# Patient Record
Sex: Female | Born: 1995 | Race: Black or African American | Hispanic: No | Marital: Single | Smoking: Never smoker
Health system: Southern US, Community
[De-identification: ages and names within clinical notes are randomized; demographics above are authoritative.]

## PROBLEM LIST (undated history)

## (undated) ENCOUNTER — Emergency Department (HOSPITAL_COMMUNITY): Source: Home / Self Care

## (undated) ENCOUNTER — Inpatient Hospital Stay (HOSPITAL_COMMUNITY): Payer: Self-pay

## (undated) DIAGNOSIS — E559 Vitamin D deficiency, unspecified: Secondary | ICD-10-CM

## (undated) DIAGNOSIS — O24419 Gestational diabetes mellitus in pregnancy, unspecified control: Secondary | ICD-10-CM

## (undated) DIAGNOSIS — F32A Depression, unspecified: Secondary | ICD-10-CM

## (undated) DIAGNOSIS — R61 Generalized hyperhidrosis: Secondary | ICD-10-CM

## (undated) DIAGNOSIS — T148XXA Other injury of unspecified body region, initial encounter: Secondary | ICD-10-CM

## (undated) DIAGNOSIS — F4310 Post-traumatic stress disorder, unspecified: Secondary | ICD-10-CM

## (undated) DIAGNOSIS — G919 Hydrocephalus, unspecified: Secondary | ICD-10-CM

## (undated) DIAGNOSIS — T7840XA Allergy, unspecified, initial encounter: Secondary | ICD-10-CM

## (undated) DIAGNOSIS — B999 Unspecified infectious disease: Secondary | ICD-10-CM

## (undated) DIAGNOSIS — Z5189 Encounter for other specified aftercare: Secondary | ICD-10-CM

## (undated) DIAGNOSIS — J45909 Unspecified asthma, uncomplicated: Secondary | ICD-10-CM

## (undated) DIAGNOSIS — K8050 Calculus of bile duct without cholangitis or cholecystitis without obstruction: Secondary | ICD-10-CM

## (undated) DIAGNOSIS — K8020 Calculus of gallbladder without cholecystitis without obstruction: Secondary | ICD-10-CM

## (undated) DIAGNOSIS — F411 Generalized anxiety disorder: Secondary | ICD-10-CM

## (undated) HISTORY — DX: Allergy, unspecified, initial encounter: T78.40XA

## (undated) HISTORY — DX: Unspecified asthma, uncomplicated: J45.909

## (undated) HISTORY — DX: Encounter for other specified aftercare: Z51.89

## (undated) HISTORY — DX: Depression, unspecified: F32.A

## (undated) HISTORY — DX: Hydrocephalus, unspecified: G91.9

---
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
## 2017-09-07 ENCOUNTER — Emergency Department (HOSPITAL_COMMUNITY)
Admission: EM | Admit: 2017-09-07 | Discharge: 2017-09-07 | Disposition: A | Discharge status: Home / Self Care | Payer: 59 | Attending: Emergency Medicine | Admitting: Emergency Medicine

## 2017-09-07 ENCOUNTER — Encounter (HOSPITAL_COMMUNITY): Payer: Self-pay | Admitting: Emergency Medicine

## 2017-09-07 DIAGNOSIS — R1013 Epigastric pain: Secondary | ICD-10-CM | POA: Insufficient documentation

## 2017-09-07 DIAGNOSIS — R102 Pelvic and perineal pain: Secondary | ICD-10-CM | POA: Diagnosis not present

## 2017-09-07 LAB — LIPASE, BLOOD: LIPASE: 23 U/L (ref 11–51)

## 2017-09-07 LAB — URINALYSIS, ROUTINE W REFLEX MICROSCOPIC
BILIRUBIN URINE: NEGATIVE
GLUCOSE, UA: NEGATIVE mg/dL
KETONES UR: 20 mg/dL — AB
Nitrite: NEGATIVE
PROTEIN: 100 mg/dL — AB
Specific Gravity, Urine: 1.017 (ref 1.005–1.030)
pH: 6 (ref 5.0–8.0)

## 2017-09-07 LAB — COMPREHENSIVE METABOLIC PANEL
ALT: 17 U/L (ref 14–54)
AST: 21 U/L (ref 15–41)
Albumin: 3.7 g/dL (ref 3.5–5.0)
Alkaline Phosphatase: 57 U/L (ref 38–126)
Anion gap: 9 (ref 5–15)
BUN: 7 mg/dL (ref 6–20)
CHLORIDE: 106 mmol/L (ref 101–111)
CO2: 25 mmol/L (ref 22–32)
CREATININE: 0.74 mg/dL (ref 0.44–1.00)
Calcium: 9.1 mg/dL (ref 8.9–10.3)
Glucose, Bld: 88 mg/dL (ref 65–99)
POTASSIUM: 4.1 mmol/L (ref 3.5–5.1)
SODIUM: 140 mmol/L (ref 135–145)
Total Bilirubin: 0.8 mg/dL (ref 0.3–1.2)
Total Protein: 6.8 g/dL (ref 6.5–8.1)

## 2017-09-07 LAB — HCG, QUANTITATIVE, PREGNANCY: hCG, Beta Chain, Quant, S: <1 m[IU]/mL

## 2017-09-07 LAB — CBC
HCT: 37.6 % (ref 36.0–46.0)
Hemoglobin: 12.3 g/dL (ref 12.0–15.0)
MCH: 27.7 pg (ref 26.0–34.0)
MCHC: 32.7 g/dL (ref 30.0–36.0)
MCV: 84.7 fL (ref 78.0–100.0)
Platelets: 268 K/uL (ref 150–400)
RBC: 4.44 MIL/uL (ref 3.87–5.11)
RDW: 12.9 % (ref 11.5–15.5)
WBC: 8.7 K/uL (ref 4.0–10.5)

## 2017-09-07 LAB — PREGNANCY, URINE: Preg Test, Ur: NEGATIVE

## 2017-09-07 LAB — I-STAT BETA HCG BLOOD, ED (MC, WL, AP ONLY): I-stat hCG, quantitative: 6.1 m[IU]/mL — ABNORMAL HIGH

## 2017-09-07 MED ORDER — RANITIDINE HCL 150 MG PO TABS: 150.0000 mg | ORAL_TABLET | Freq: Two times a day (BID) | ORAL | 0 refills | Status: DC

## 2017-09-07 MED ORDER — ONDANSETRON 4 MG PO TBDP: 4.0000 mg | ORAL_TABLET | Freq: Three times a day (TID) | ORAL | 0 refills | Status: DC | PRN

## 2017-09-07 NOTE — ED Triage Notes (Signed)
Pt reports lower abd pain X 1 day, states pain is going into back. Sharp/cramping, 10/10. Denies N/V/D.

## 2017-09-07 NOTE — Discharge Instructions (Signed)
1. Medications: Take Zofran as needed for nausea.  Wait around 20 minutes before eating or drinking after taking this medication.  Start taking Zantac twice daily with meals. 2. Treatment: rest, drink plenty of fluids, advance diet slowly.  Eat a diet of bland foods that will not upset your stomach.  See the attached information. 3. Follow Up: Please followup with your primary doctor in 3 days for discussion of your diagnoses and further evaluation after today's visit; if you do not have a primary care doctor, call the number on the back of your insurance card to help find one. please return to the ER for persistent vomiting, high fevers or worsening symptoms

## 2017-09-07 NOTE — ED Provider Notes (Signed)
[facility] EMERGENCY DEPARTMENT Provider Note   CSN: [id number] Arrival date & time: 09/07/17  0000     History   Chief Complaint Chief Complaint  Patient presents with  . Abdominal Pain    [name] is a 22 y.o. female with no significant past medical history presents today for evaluation of acute onset, intermittent epigastric abdominal pain for 2 days.  She states that pain is in the epigastric region, throbbing and sharp in nature and comes on after meals.  Pain will at times radiate to her back.  Pain worsened after taking Pepto-Bismol and she states that after taking this medication she had 5 episodes of nonbloody nonbilious emesis.  She endorses intermittent nausea but denies any other episodes of emesis.  Denies diarrhea, constipation, melena, hematochezia, dysuria, hematuria, urgency.  She does endorse a few days of urinary frequency.  She is also tried drinking a lot of water and ginger ale without relief of her symptoms.  She is currently sexually active with one female partner and does not always use protection.  She denies vaginal itching, bleeding, or discharge.  She states she has no concern for STDs.  She currently denies any abdominal pain or nausea.   The history is provided by the patient.    History reviewed. No pertinent past medical history.  There are no active problems to display for this patient.   History reviewed. No pertinent surgical history.   OB History   None      Home Medications    Prior to Admission medications   Medication Sig Start Date End Date Taking? Authorizing Provider  hydrocortisone cream 1 % Apply to affected area 2 times daily Patient not taking: Reported on 09/07/2017 11/28/16   Belinda Fisher, PA-C  naproxen (NAPROSYN) 500 MG tablet Take 1 tablet (500 mg total) by mouth 2 (two) times daily with a meal. Patient not taking: Reported on 09/07/2017 09/05/16   Garlon Hatchet, PA-C  ondansetron (ZOFRAN ODT) 4 MG  disintegrating tablet Take 1 tablet (4 mg total) by mouth every 8 (eight) hours as needed for nausea or vomiting. 09/07/17   Naw Lasala A, PA-C  ranitidine (ZANTAC) 150 MG tablet Take 1 tablet (150 mg total) by mouth 2 (two) times daily. 09/07/17   Jeanie Sewer, PA-C    Family History No family history on file.  Social History Social History   Tobacco Use  . Smoking status: Never Smoker  . Smokeless tobacco: Never Used  Substance Use Topics  . Alcohol use: Not Currently  . Drug use: Not Currently     Allergies   Patient has no known allergies.   Review of Systems Review of Systems  Constitutional: Negative for chills and fever.  Respiratory: Negative for shortness of breath.   Cardiovascular: Negative for chest pain.  Gastrointestinal: Positive for abdominal pain, nausea and vomiting (resolved). Negative for blood in stool, constipation and diarrhea.  Genitourinary: Negative for dysuria, hematuria, urgency, vaginal bleeding, vaginal discharge and vaginal pain.  All other systems reviewed and are negative.    Physical Exam Updated Vital Signs BP 128/80 (BP Location: Right Arm)   Pulse 70   Temp 98.7 F (37.1 C) (Oral)   Resp 17   Ht  (1.549 m)   Wt 97.5 kg (215 lb)   SpO2 100%   BMI 40.62 kg/m   Physical Exam  Constitutional: She appears well-developed and well-nourished. No distress.  Resting comfortably in bed  HENT:  Head: Normocephalic and atraumatic.  Eyes: Conjunctivae are normal. Right eye exhibits no discharge. Left eye exhibits no discharge.  Neck: No JVD present. No tracheal deviation present.  Cardiovascular: Normal rate.  Pulmonary/Chest: Effort normal.  Abdominal: Soft. Bowel sounds are normal. She exhibits no distension. There is no tenderness. There is no rigidity, no rebound, no guarding, no CVA tenderness, no tenderness at McBurney's point and negative Murphy's sign.  Musculoskeletal: She exhibits no edema.  No midline spine TTP, no  paraspinal muscle tenderness, no deformity, crepitus, or step-off noted   Neurological: She is alert.  Skin: Skin is warm and dry. No erythema.  Psychiatric: She has a normal mood and affect. Her behavior is normal.  Nursing note and vitals reviewed.    ED Treatments / Results  Labs (all labs ordered are listed, but only abnormal results are displayed) Labs Reviewed  URINALYSIS, ROUTINE W REFLEX MICROSCOPIC - Abnormal; Notable for the following components:      Result Value   APPearance CLOUDY (*)    Hgb urine dipstick MODERATE (*)    Ketones, ur 20 (*)    Protein, ur 100 (*)    Leukocytes, UA SMALL (*)    RBC / HPF >50 (*)    WBC, UA >50 (*)    Bacteria, UA RARE (*)    Non Squamous Epithelial 0-5 (*)    All other components within normal limits  I-STAT BETA HCG BLOOD, ED ([facility], [facility], [facility] ONLY) - Abnormal; Notable for the following components:   I-stat hCG, quantitative 6.1 (*)    All other components within normal limits  URINE CULTURE  LIPASE, BLOOD  COMPREHENSIVE METABOLIC PANEL  CBC  HCG, QUANTITATIVE, PREGNANCY  PREGNANCY, URINE    EKG None  Radiology No results found.  Procedures Procedures (including critical care time)  Medications Ordered in ED Medications - No data to display   Initial Impression / Assessment and Plan / ED Course  I have reviewed the triage vital signs and the nursing notes.  Pertinent labs & imaging results that were available during my care of the patient were reviewed by me and considered in my medical decision making (see chart for details).     Patient presents with intermittent epigastric abdominal pain for 3 days, typically after meals.  She is afebrile, vital signs are stable.  She is nontoxic in appearance.  She had nausea and vomiting after taking Pepto-Bismol.  On exam, abdomen is soft and entirely benign.  She denies any pain or nausea.  She does note urinary frequency but states she has been drinking a lot of water to help her  symptoms.  Urine is equivocal, not a clean-catch.  We will culture with a new urine sample but defer treatment at this time.  I-STAT beta hCG is very minimally elevated, I suspect this is a false positive.  Urine pregnancy is negative, supporting this.  Remainder of lab work reviewed by me is entirely unremarkable with no leukocytosis, anemia, electrolyte abnormalities, or abnormal LFTs, lipase, or creatinine.  I doubt obstruction, perforation, ascites, colitis, PID, TOA, ovarian torsion given current symptoms.  Symptoms are suggestive of possible GERD/PUD.  Will discharge with Zantac and Zofran.  Discussed diet modifications.  Recommend follow-up with primary care physician for reevaluation of symptoms.  Discussed strict ED return precautions. Pt verbalized understanding of and agreement with plan and is safe for discharge home at this time.   Final Clinical Impressions(s) / ED Diagnoses   Final diagnoses:  Epigastric pain  ED Discharge Orders        Ordered    ranitidine (ZANTAC) 150 MG tablet  2 times daily     09/07/17 0824    ondansetron (ZOFRAN ODT) 4 MG disintegrating tablet  Every 8 hours PRN     09/07/17 0824       Jeanie Sewer, PA-C 09/07/17 4098    Zadie Rhine, MD 09/08/17 [phone]

## 2017-09-09 LAB — URINE CULTURE: Culture: >=100000 — AB

## 2017-09-10 ENCOUNTER — Telehealth: Payer: Self-pay | Admitting: *Deleted

## 2017-09-10 NOTE — Telephone Encounter (Signed)
Post ED Visit - Positive Culture Follow-up: Successful Patient Follow-Up  Culture assessed and recommendations reviewed by:  Enzo Bi, Pharm.D.  Celedonio Miyamoto, Pharm.D., BCPS AQ-ID  Garvin Fila, Pharm.D., BCPS  Georgina Pillion, Pharm.D., BCPS  [place], [place].D., BCPS, AAHIVP  Estella Husk, Pharm.D., BCPS, AAHIVP  Lysle Pearl, PharmD, BCPS  Sherlynn Carbon, PharmD  Pollyann Samples, PharmD, BCPS Sharin Mons, PharmD  Positive urine culture   Patient discharged without antimicrobial prescription and treatment is now indicated  Organism is resistant to prescribed ED discharge antimicrobial  Patient with positive blood cultures  Changes discussed with ED provider:Michael Maczis, PA-C New antibiotic prescription Bactrim DS 1 tab PO BID x 8 days Called to [facility], [address], [phone]  Contacted patient, date 09/10/2017, time 1011   Virl Axe [place] 09/10/2017, 10:09 AM

## 2017-09-10 NOTE — Progress Notes (Signed)
ED Antimicrobial Stewardship Positive Culture Follow Up   Martha Hall is an 22 y.o. female who presented to [facility] on 09/07/2017 with a chief complaint of  Chief Complaint  Patient presents with  . Abdominal Pain    Recent Results (from the past 720 hour(s))  Urine culture     Status: Abnormal   Collection Time: 09/07/17  7:58 AM  Result Value Ref Range Status   Specimen Description URINE, CLEAN CATCH  Final   Special Requests   Final    NONE Performed at [facility], [address], [place], [place] [address]    Culture >=100,000 COLONIES/mL STAPHYLOCOCCUS SAPROPHYTICUS (A)  Final   Report Status 09/09/2017 FINAL  Final   Organism ID, Bacteria STAPHYLOCOCCUS SAPROPHYTICUS (A)  Final      Susceptibility   Staphylococcus saprophyticus - MIC*    CIPROFLOXACIN <=0.5 SENSITIVE Sensitive     GENTAMICIN <=0.5 SENSITIVE Sensitive     NITROFURANTOIN <=16 SENSITIVE Sensitive     OXACILLIN 0.5 RESISTANT Resistant     TETRACYCLINE 2 SENSITIVE Sensitive     VANCOMYCIN 1 SENSITIVE Sensitive     TRIMETH/SULFA <=10 SENSITIVE Sensitive     CLINDAMYCIN <=0.25 SENSITIVE Sensitive     RIFAMPIN <=0.5 SENSITIVE Sensitive     Inducible Clindamycin NEGATIVE Sensitive     * >=100,000 COLONIES/mL STAPHYLOCOCCUS SAPROPHYTICUS    Patient discharged without antibiotic.  New antibiotic prescription:  Call patient Bactrim DS - 1 tablet BID X 3 days  ED Provider: Leary Roca, PA-C   Della Goo, PharmD, BCPS 09/10/2017, 9:21 AM Infectious Diseases Pharmacist Phone# [phone]

## 2018-05-24 ENCOUNTER — Encounter (HOSPITAL_COMMUNITY): Payer: Self-pay

## 2018-05-24 ENCOUNTER — Other Ambulatory Visit: Payer: Self-pay

## 2018-05-24 ENCOUNTER — Ambulatory Visit (HOSPITAL_COMMUNITY)
Admission: EM | Admit: 2018-05-24 | Discharge: 2018-05-24 | Disposition: A | Discharge status: Home / Self Care | Payer: 59 | Attending: Internal Medicine | Admitting: Internal Medicine

## 2018-05-24 DIAGNOSIS — J019 Acute sinusitis, unspecified: Secondary | ICD-10-CM

## 2018-05-24 MED ORDER — FLUTICASONE PROPIONATE 50 MCG/ACT NA SUSP: 2.0000 | Freq: Every day | NASAL | 0 refills | Status: DC

## 2018-05-24 MED ORDER — AMOXICILLIN-POT CLAVULANATE 875-125 MG PO TABS: 1.0000 | ORAL_TABLET | Freq: Two times a day (BID) | ORAL | 0 refills | Status: AC

## 2018-05-24 MED ORDER — CETIRIZINE-PSEUDOEPHEDRINE ER 5-120 MG PO TB12: 1.0000 | ORAL_TABLET | Freq: Every day | ORAL | 0 refills | Status: DC

## 2018-05-24 NOTE — ED Provider Notes (Signed)
[facility]   [id number] 05/24/18 Arrival Time: 1614   CC: URI symptoms   SUBJECTIVE: History from: patient.  Martha Hall is a 23 y.o. female who presents with abrupt onset of nasal congestion, sinus pain/ pressure, sore throat, and cough x "2 months."  Worsening symptoms over the past few days.  Denies positive sick exposure or precipitating event.  Has tried OTC medications without relief.  Symptoms are made worse at night.  Denies previous symptoms in the past.   Complains of difficulty breathing through nose.  Denies fever, chills, fatigue, rhinorrhea, SOB, wheezing, chest pain, nausea, changes in bowel or bladder habits.    Received flu shot this year: no.  ROS: As per HPI.  History reviewed. No pertinent past medical history. History reviewed. No pertinent surgical history. No Known Allergies No current facility-administered medications on file prior to encounter.    Current Outpatient Medications on File Prior to Encounter  Medication Sig Dispense Refill  . ondansetron (ZOFRAN ODT) 4 MG disintegrating tablet Take 1 tablet (4 mg total) by mouth every 8 (eight) hours as needed for nausea or vomiting. 10 tablet 0  . ranitidine (ZANTAC) 150 MG tablet Take 1 tablet (150 mg total) by mouth 2 (two) times daily. 60 tablet 0   Social History   Socioeconomic History  . Marital status: Single    Spouse name: Not on file  . Number of children: Not on file  . Years of education: Not on file  . Highest education level: Not on file  Occupational History  . Not on file  Social Needs  . Financial resource strain: Not on file  . Food insecurity:    Worry: Not on file    Inability: Not on file  . Transportation needs:    Medical: Not on file    Non-medical: Not on file  Tobacco Use  . Smoking status: Never Smoker  . Smokeless tobacco: Never Used  Substance and Sexual Activity  . Alcohol use: Not Currently  . Drug use: Not Currently  . Sexual activity: Not on file   Lifestyle  . Physical activity:    Days per week: Not on file    Minutes per session: Not on file  . Stress: Not on file  Relationships  . Social connections:    Talks on phone: Not on file    Gets together: Not on file    Attends religious service: Not on file    Active member of club or organization: Not on file    Attends meetings of clubs or organizations: Not on file    Relationship status: Not on file  . Intimate partner violence:    Fear of current or ex partner: Not on file    Emotionally abused: Not on file    Physically abused: Not on file    Forced sexual activity: Not on file  Other Topics Concern  . Not on file  Social History Narrative  . Not on file   History reviewed. No pertinent family history.  OBJECTIVE:  Vitals:   05/24/18 1803  BP: 116/88  Pulse: 70  Resp: 17  Temp: (!) 97 F (36.1 C)  TempSrc: Oral  SpO2: 100%     General appearance: alert; appears mildly fatigued, but nontoxic; speaking in full sentences and tolerating own secretions HEENT: NCAT; Ears: EACs clear with mild cerumen, TMs pearly gray; Eyes: PERRL.  EOM grossly intact. Sinuses: mild TTP; Nose: nares patent without rhinorrhea, turbinates swollen and erythematous, Throat: oropharynx  clear, tonsils non erythematous or enlarged, uvula midline  Neck: supple without LAD Lungs: unlabored respirations, symmetrical air entry; cough: absent; no respiratory distress; CTAB Heart: regular rate and rhythm.  Radial pulses 2+ symmetrical bilaterally Skin: warm and dry Psychological: alert and cooperative; normal mood and affect  ASSESSMENT & PLAN:  1. Acute non-recurrent sinusitis, unspecified location     Meds ordered this encounter  Medications  . amoxicillin-clavulanate (AUGMENTIN) 875-125 MG tablet    Sig: Take 1 tablet by mouth every 12 (twelve) hours for 10 days.    Dispense:  20 tablet    Refill:  0    Order Specific Question:   Supervising Provider    Answer:   Eustace MooreNELSON, YVONNE  SUE [[id number]]  . cetirizine-pseudoephedrine (ZYRTEC-D) 5-120 MG tablet    Sig: Take 1 tablet by mouth daily.    Dispense:  30 tablet    Refill:  0    Order Specific Question:   Supervising Provider    Answer:   Eustace MooreNELSON, YVONNE SUE [[id number]]  . fluticasone (FLONASE) 50 MCG/ACT nasal spray    Sig: Place 2 sprays into both nostrils daily.    Dispense:  16 g    Refill:  0    Order Specific Question:   Supervising Provider    Answer:   Eustace MooreELSON, YVONNE SUE [[id number]]    Rest and push fluids Zyrtec D and flonase prescribed.  Use daily for symptomatic relief Augmentin prescribed.  Take as directed and to completion Continue with OTC ibuprofen/tylenol as needed for pain Follow up with PCP or [facility] if symptoms persists Return or go to the ED if you have any new or worsening symptoms such as fever, chills, worsening sinus pain/pressure, cough, sore throat, chest pain, shortness of breath, abdominal pain, changes in bowel or bladder habits, etc...  Reviewed expectations re: course of current medical issues. Questions answered. Outlined signs and symptoms indicating need for more acute intervention. Patient verbalized understanding. After Visit Summary given.         Rennis HardingWurst, Yumiko Alkins, PA-C 05/24/18 1851

## 2018-05-24 NOTE — ED Triage Notes (Signed)
Pt presents to [facility] for cough and congestion x4 days, pt also complains of SOB when coughing. Pt has taken OTC medications but has no relief

## 2018-05-24 NOTE — Discharge Instructions (Signed)
Rest and push fluids °Zyrtec D and flonase prescribed.  Use daily for symptomatic relief °Augmentin prescribed.  Take as directed and to completion °Continue with OTC ibuprofen/tylenol as needed for pain °Follow up with PCP or [facility] if symptoms persists °Return or go to the ED if you have any new or worsening symptoms such as fever, chills, worsening sinus pain/pressure, cough, sore throat, chest pain, shortness of breath, abdominal pain, changes in bowel or bladder habits, etc...  °

## 2019-03-27 ENCOUNTER — Encounter: Payer: Self-pay | Admitting: Family Medicine

## 2019-03-27 ENCOUNTER — Other Ambulatory Visit: Payer: Self-pay

## 2019-03-27 ENCOUNTER — Ambulatory Visit (INDEPENDENT_AMBULATORY_CARE_PROVIDER_SITE_OTHER): Payer: 59 | Admitting: General Practice

## 2019-03-27 DIAGNOSIS — Z3201 Encounter for pregnancy test, result positive: Secondary | ICD-10-CM

## 2019-03-27 LAB — POCT PREGNANCY, URINE: Preg Test, Ur: POSITIVE — AB

## 2019-03-27 MED ORDER — PRENATAL VITAMIN 27-0.8 MG PO TABS: 1.0000 | ORAL_TABLET | Freq: Every day | ORAL | 11 refills | Status: DC

## 2019-03-27 NOTE — Progress Notes (Signed)
Patient presents to office today for UPT. UPT +. Patient reports first positive home test last Wednesday. LMP 02/16/19 EDD 11/23/19 [redacted]w[redacted]d. Reports taking PNV daily but would like Rx sent in. Rx sent in per protocol. Advised she begin OB care.   Koren Bound RN BSN 03/27/19

## 2019-03-30 NOTE — Progress Notes (Signed)
I agree with the nurses note and plan of care.    Lezlie Lye, NP 03/30/2019 4:17 PM

## 2019-04-23 ENCOUNTER — Ambulatory Visit (INDEPENDENT_AMBULATORY_CARE_PROVIDER_SITE_OTHER): Payer: 59

## 2019-04-23 DIAGNOSIS — O0993 Supervision of high risk pregnancy, unspecified, third trimester: Secondary | ICD-10-CM | POA: Insufficient documentation

## 2019-04-23 DIAGNOSIS — Z3490 Encounter for supervision of normal pregnancy, unspecified, unspecified trimester: Secondary | ICD-10-CM | POA: Insufficient documentation

## 2019-04-23 MED ORDER — BLOOD PRESSURE KIT DEVI: 1.0000 | 0 refills | Status: DC

## 2019-04-23 NOTE — Progress Notes (Signed)
I connected with  Martha Hall on 04/23/19 by a video enabled telemedicine application and verified that I am speaking with the correct person using two identifiers.   I discussed the limitations of evaluation and management by telemedicine. The patient expressed understanding and agreed to proceed.  PRENATAL INTAKE SUMMARY  Martha Hall presents today New OB Nurse Interview.  OB History    Gravida  1   Para      Term      Preterm      AB      Living  0     SAB      TAB      Ectopic      Multiple      Live Births             I have reviewed the patient's medical, obstetrical, social, and family histories, medications, and available lab results.  SUBJECTIVE She has no unusual complaints  OBJECTIVE Initial Physical Exam (New OB)  GENERAL APPEARANCE: alert, well appearing, oriented to person, place and time   ASSESSMENT Normal pregnancy  PLAN Prenatal care at [facility] OB Pnl/HIV will be done at [facility] Appt. BP Cuff ordered

## 2019-04-25 ENCOUNTER — Other Ambulatory Visit: Payer: Self-pay

## 2019-04-25 ENCOUNTER — Inpatient Hospital Stay (HOSPITAL_COMMUNITY): Payer: 59

## 2019-04-25 ENCOUNTER — Inpatient Hospital Stay (HOSPITAL_COMMUNITY)
Admission: EM | Admit: 2019-04-25 | Discharge: 2019-04-25 | Disposition: A | Discharge status: Home / Self Care | Payer: 59 | Attending: Obstetrics & Gynecology | Admitting: Obstetrics & Gynecology

## 2019-04-25 ENCOUNTER — Encounter (HOSPITAL_COMMUNITY): Payer: Self-pay | Admitting: Obstetrics & Gynecology

## 2019-04-25 DIAGNOSIS — Z7951 Long term (current) use of inhaled steroids: Secondary | ICD-10-CM | POA: Insufficient documentation

## 2019-04-25 DIAGNOSIS — O418X10 Other specified disorders of amniotic fluid and membranes, first trimester, not applicable or unspecified: Secondary | ICD-10-CM | POA: Diagnosis not present

## 2019-04-25 DIAGNOSIS — Z79899 Other long term (current) drug therapy: Secondary | ICD-10-CM | POA: Insufficient documentation

## 2019-04-25 DIAGNOSIS — Z3490 Encounter for supervision of normal pregnancy, unspecified, unspecified trimester: Secondary | ICD-10-CM

## 2019-04-25 DIAGNOSIS — O209 Hemorrhage in early pregnancy, unspecified: Secondary | ICD-10-CM | POA: Diagnosis present

## 2019-04-25 DIAGNOSIS — Z3A09 9 weeks gestation of pregnancy: Secondary | ICD-10-CM | POA: Diagnosis not present

## 2019-04-25 DIAGNOSIS — Z3A10 10 weeks gestation of pregnancy: Secondary | ICD-10-CM | POA: Diagnosis not present

## 2019-04-25 DIAGNOSIS — O468X1 Other antepartum hemorrhage, first trimester: Secondary | ICD-10-CM

## 2019-04-25 HISTORY — DX: Unspecified infectious disease: B99.9

## 2019-04-25 LAB — WET PREP, GENITAL
Sperm: NONE SEEN
Trich, Wet Prep: NONE SEEN
Yeast Wet Prep HPF POC: NONE SEEN

## 2019-04-25 LAB — URINALYSIS, ROUTINE W REFLEX MICROSCOPIC
Bilirubin Urine: NEGATIVE
Glucose, UA: NEGATIVE mg/dL
Ketones, ur: 20 mg/dL — AB
Nitrite: NEGATIVE
Protein, ur: NEGATIVE mg/dL
Specific Gravity, Urine: 1.02 (ref 1.005–1.030)
pH: 6 (ref 5.0–8.0)

## 2019-04-25 LAB — CBC
HCT: 39.5 % (ref 36.0–46.0)
Hemoglobin: 12.9 g/dL (ref 12.0–15.0)
MCH: 28 pg (ref 26.0–34.0)
MCHC: 32.7 g/dL (ref 30.0–36.0)
MCV: 85.7 fL (ref 80.0–100.0)
Platelets: 335 10*3/uL (ref 150–400)
RBC: 4.61 MIL/uL (ref 3.87–5.11)
RDW: 13 % (ref 11.5–15.5)
WBC: 7.3 10*3/uL (ref 4.0–10.5)
nRBC: 0 % (ref 0.0–0.2)

## 2019-04-25 LAB — HCG, QUANTITATIVE, PREGNANCY: hCG, Beta Chain, Quant, S: 173927 m[IU]/mL — ABNORMAL HIGH (ref <5)

## 2019-04-25 IMAGING — US US OB COMP LESS 14 WK
1 series · 15 of 28 positions shown · non-contrast
Comparison: None.

CLINICAL DATA: Vaginal bleeding in 1st trimester pregnancy.

EXAM:
OBSTETRIC <14 WK ULTRASOUND
TECHNIQUE: Transabdominal ultrasound was performed for evaluation of the
gestation as well as the maternal uterus and adnexal regions.

[Series 1: us ob comp less 14 wk · 15 of 29 slices shown]
[im 1/29]
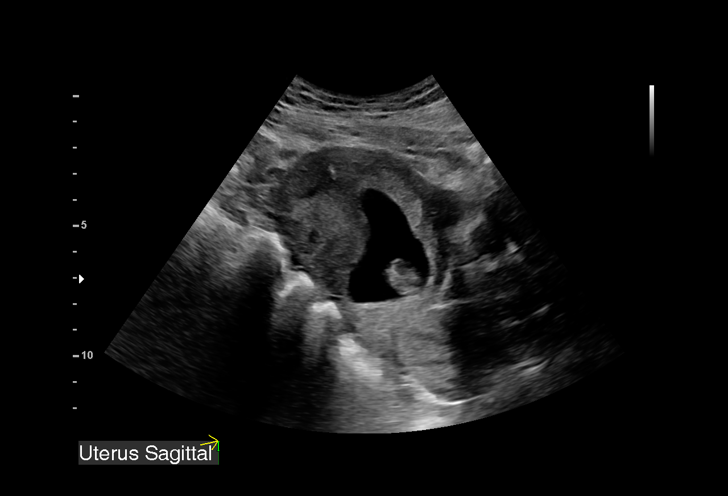
[im 3/29]
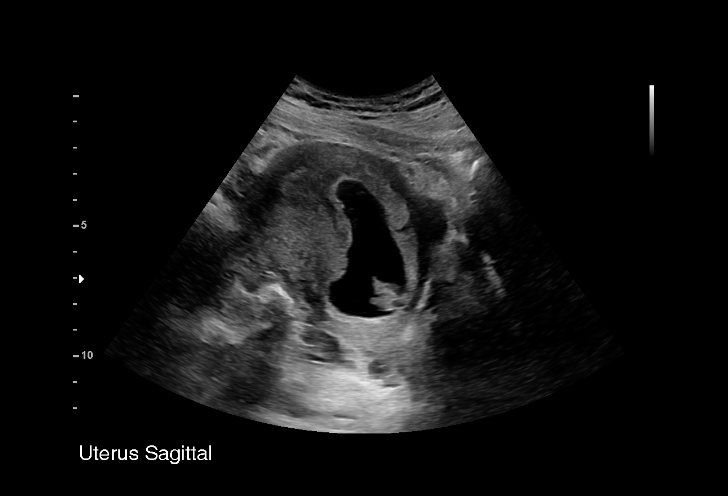
[im 5/29]
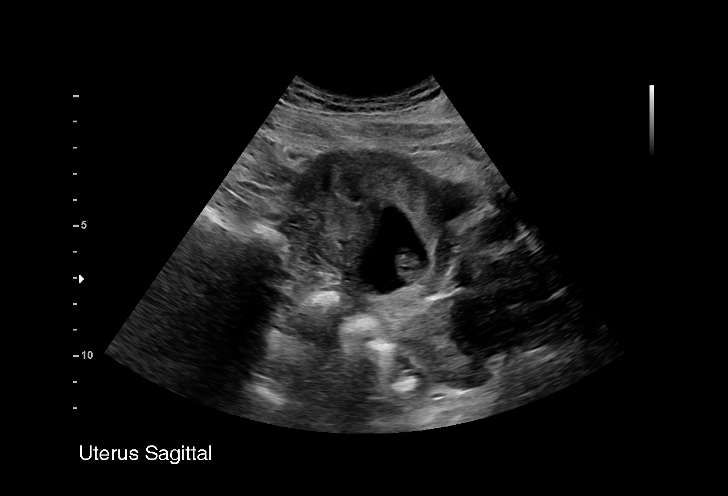
[im 7/29]
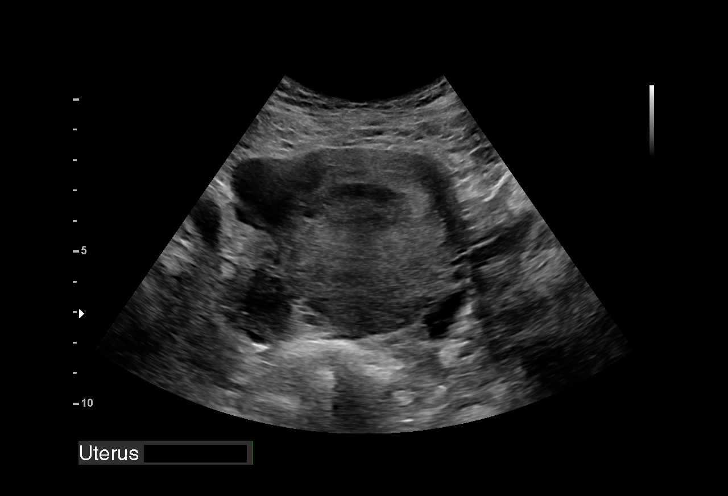
[im 9/29]
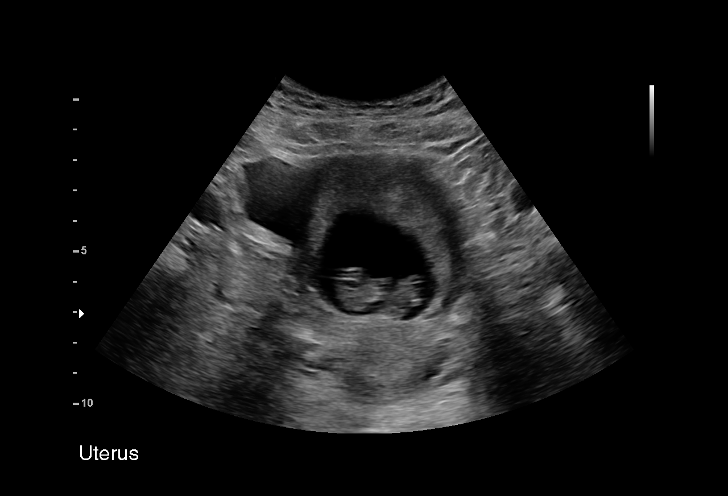
[im 11/29]
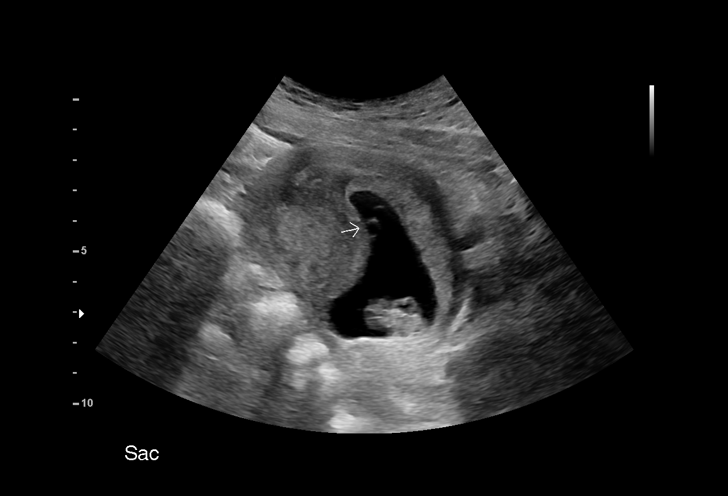
[im 13/29]
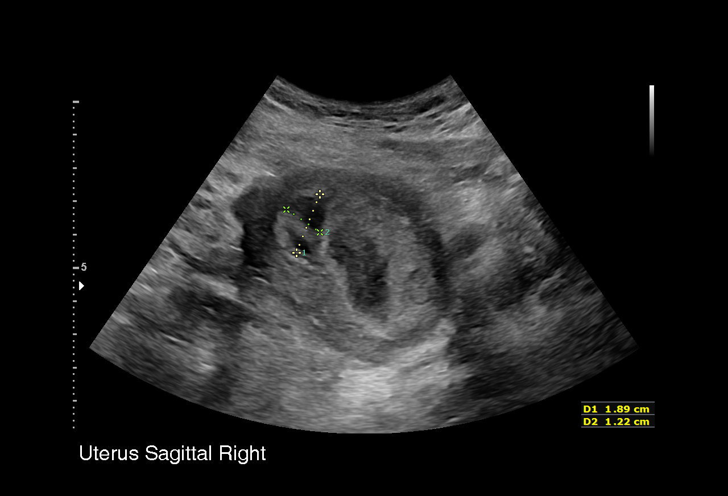
[im 15/29]
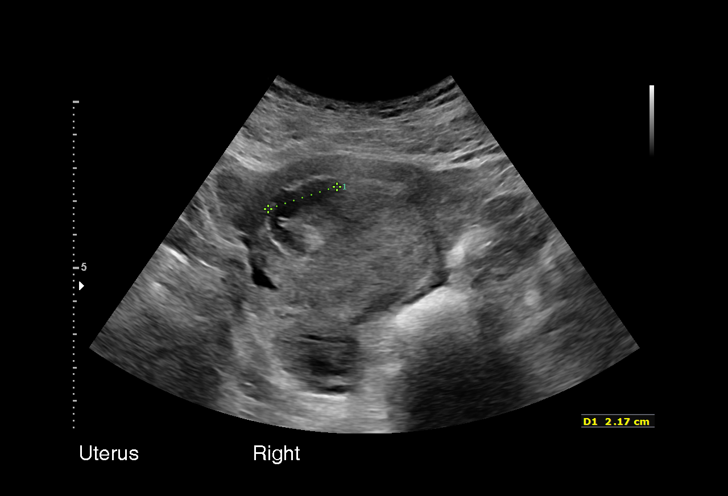
[im 16/29]
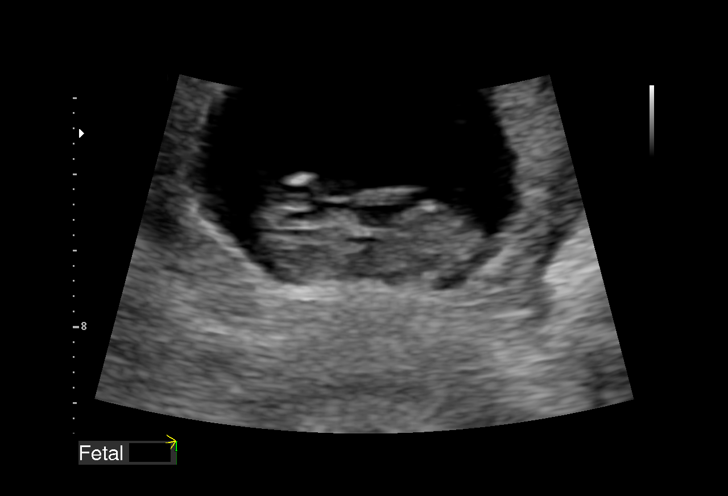
[im 18/29]
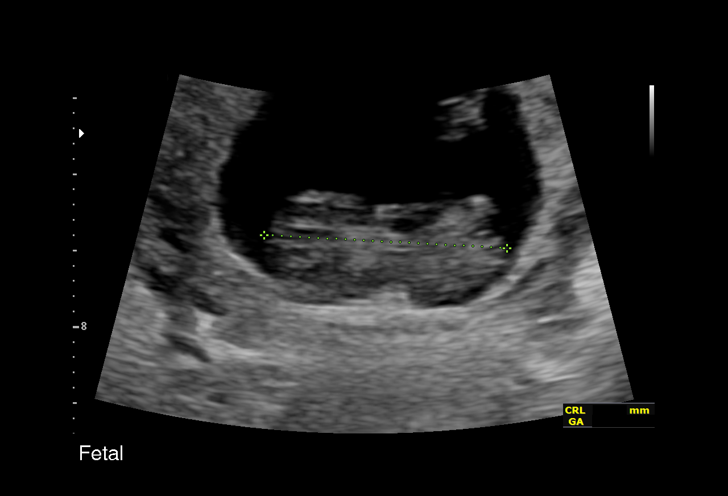
[im 20/29]
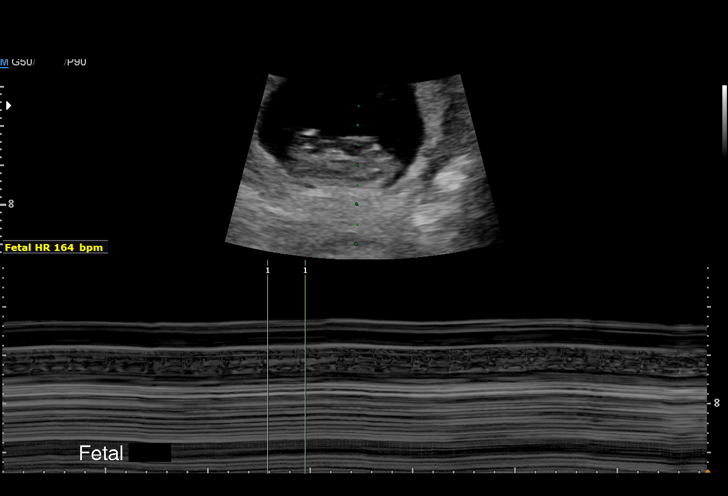
[im 22/29]
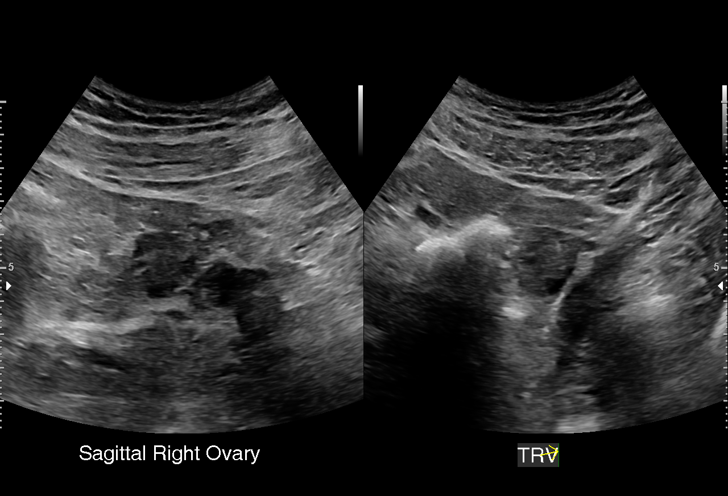
[im 24/29]
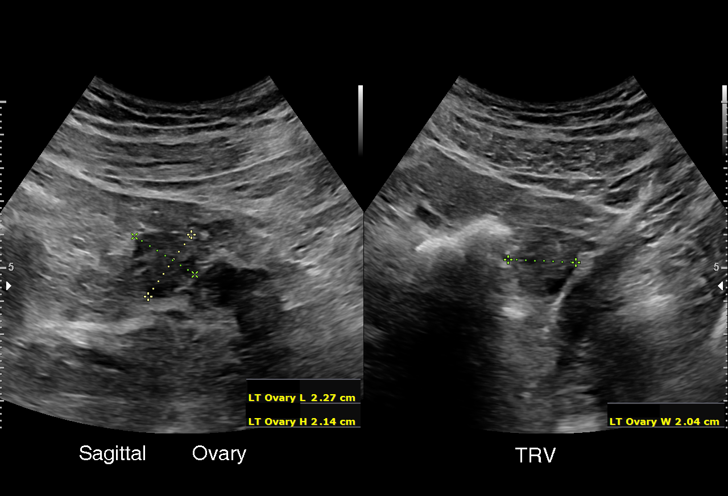
[im 26/29]
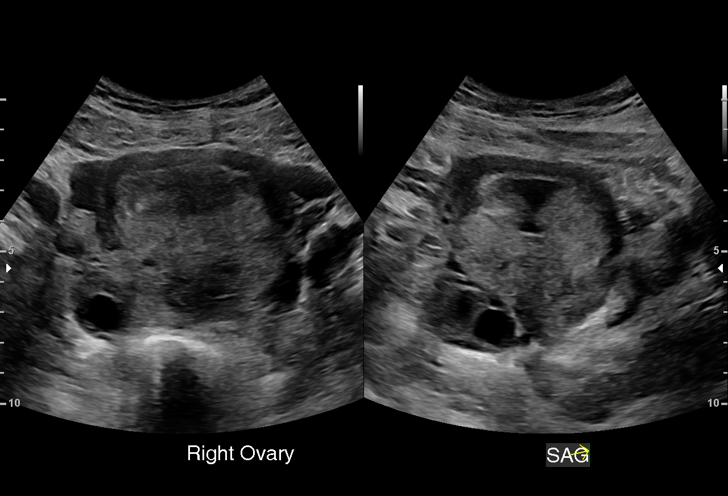
[im 29/29]
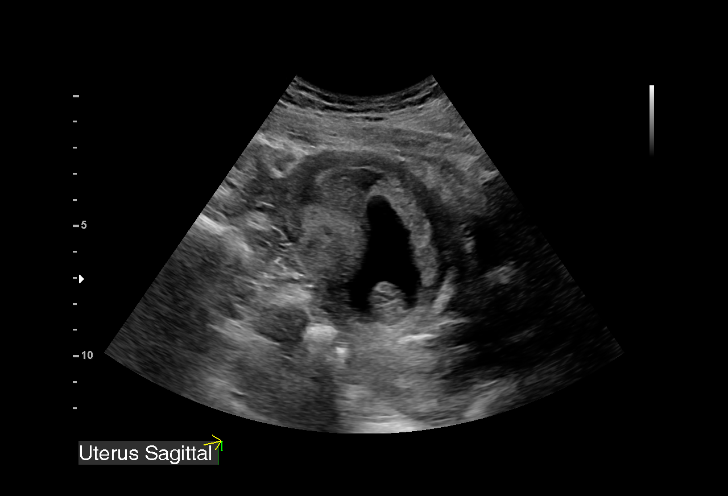

[15 of 28 positions shown; findings below may reference images not displayed]

FINDINGS: Intrauterine gestational sac: Single

Yolk sac:  Visualized.

Embryo:  Visualized.

Cardiac Activity: Visualized.

Heart Rate: 164 bpm

CRL: 32 mm   10 w 0 d                  US EDC: [DATE]

Subchorionic hemorrhage: A small subchorionic hemorrhage is seen in
the fundal region.

Maternal uterus/adnexae: Both ovaries are normal in appearance. No
mass or abnormal free fluid identified.
IMPRESSION: Single living IUP with estimated gestational age of 10 weeks 0 days,
and US EDC of [DATE].

Small subchorionic hemorrhage noted.

## 2019-04-25 NOTE — ED Notes (Signed)
Report called to MAU, patient transported by Clifton James, ED EMT

## 2019-04-25 NOTE — Progress Notes (Signed)
I have reviewed this chart and agree with the RN/CMA assessment and management.    K. Meryl Kidada Ging, M.D. Attending [facility] (Faculty Practice)   

## 2019-04-25 NOTE — MAU Provider Note (Signed)
History     CSN: [id number]  Arrival date and time: 04/25/19 1026   First Provider Initiated Contact with Patient 04/25/19 1136      Chief Complaint  Patient presents with  . Vaginal Bleeding   Martha Hall is a 23 y.o. G1P0000 at 35w5dwho presents today with spotting. She had an episode earlier today, but it has since stopped. She denies any pain.   Vaginal Bleeding The patient's primary symptoms include vaginal bleeding. The patient's pertinent negatives include no pelvic pain. This is a new problem. The current episode started today. Episode frequency: once. The problem has been resolved. The patient is experiencing no pain. She is pregnant. Pertinent negatives include no chills, dysuria, fever, frequency, nausea or vomiting. The vaginal bleeding is lighter than menses. She has not been passing clots. She has not been passing tissue. Nothing aggravates the symptoms. She has tried nothing for the symptoms. Sexual activity: denies intercourse in the last 24 hours.  She uses nothing for contraception. Her menstrual history has been regular (LMP 02/16/2019).    OB History    Gravida  1   Para      Term      Preterm      AB  0   Living  0     SAB  0   TAB      Ectopic      Multiple      Live Births              Past Medical History:  Diagnosis Date  . Headache   . Infection    UTI    Past Surgical History:  Procedure Laterality Date  . BRAIN SURGERY  003/12/1997  stint placed when born    Family History  Problem Relation Age of Onset  . Healthy Mother   . Healthy Father     Social History   Tobacco Use  . Smoking status: Never Smoker  . Smokeless tobacco: Never Used  Substance Use Topics  . Alcohol use: Never  . Drug use: Never    Allergies: No Known Allergies  Medications Prior to Admission  Medication Sig Dispense Refill Last Dose  . Prenatal Vit-Fe Fumarate-FA (PRENATAL VITAMIN) 27-0.8 MG TABS Take 1 tablet by mouth daily. 30 tablet  11 04/24/2019 at Unknown time  . Blood Pressure Monitoring (BLOOD PRESSURE KIT) DEVI 1 kit by Does not apply route once a week. Check BP regularly and record readings into the [employer] App.  Large Cuff.  DX O90.0 1 each 0   . cetirizine-pseudoephedrine (ZYRTEC-D) 5-120 MG tablet Take 1 tablet by mouth daily. (Patient not taking: Reported on 04/23/2019) 30 tablet 0   . fluticasone (FLONASE) 50 MCG/ACT nasal spray Place 2 sprays into both nostrils daily. (Patient not taking: Reported on 04/23/2019) 16 g 0   . ondansetron (ZOFRAN ODT) 4 MG disintegrating tablet Take 1 tablet (4 mg total) by mouth every 8 (eight) hours as needed for nausea or vomiting. (Patient not taking: Reported on 04/23/2019) 10 tablet 0   . Prenatal 27-1 MG TABS Take 1 tablet by mouth daily.     . ranitidine (ZANTAC) 150 MG tablet Take 1 tablet (150 mg total) by mouth 2 (two) times daily. (Patient not taking: Reported on 04/23/2019) 60 tablet 0     Review of Systems  Constitutional: Negative for chills and fever.  Gastrointestinal: Negative for nausea and vomiting.  Genitourinary: Positive for vaginal bleeding. Negative for dysuria, frequency and pelvic pain.  Physical Exam   Blood pressure 115/68, pulse 88, temperature 98.3 F (36.8 C), temperature source Oral, resp. rate 16, height _0  (1.549 m), weight 103.7 kg, last menstrual period 02/16/2019, SpO2 100 %.  Physical Exam  Nursing note and vitals reviewed. Constitutional: She is oriented to person, place, and time. She appears well-developed and well-nourished. No distress.  HENT:  Head: Normocephalic.  Cardiovascular: Normal rate.  Respiratory: Effort normal.  GI: Soft. There is no abdominal tenderness. There is no rebound.  Neurological: She is alert and oriented to person, place, and time.  Skin: Skin is warm and dry.  Psychiatric: She has a normal mood and affect.    Results for orders placed or performed during the hospital encounter of 04/25/19 (from  the past 24 hour(s))  Urinalysis, Routine w reflex microscopic     Status: Abnormal   Collection Time: 04/25/19 12:08 PM  Result Value Ref Range   Color, Urine YELLOW YELLOW   APPearance CLOUDY (A) CLEAR   Specific Gravity, Urine 1.020 1.005 - 1.030   pH 6.0 5.0 - 8.0   Glucose, UA NEGATIVE NEGATIVE mg/dL   Hgb urine dipstick LARGE (A) NEGATIVE   Bilirubin Urine NEGATIVE NEGATIVE   Ketones, ur 20 (A) NEGATIVE mg/dL   Protein, ur NEGATIVE NEGATIVE mg/dL   Nitrite NEGATIVE NEGATIVE   Leukocytes,Ua LARGE (A) NEGATIVE   RBC / HPF 0-5 0 - 5 RBC/hpf   WBC, UA 21-50 0 - 5 WBC/hpf   Bacteria, UA RARE (A) NONE SEEN   Squamous Epithelial / LPF 21-50 0 - 5   Mucus PRESENT   CBC     Status: None   Collection Time: 04/25/19 12:16 PM  Result Value Ref Range   WBC 7.3 4.0 - 10.5 K/uL   RBC 4.61 3.87 - 5.11 MIL/uL   Hemoglobin 12.9 12.0 - 15.0 g/dL   HCT 39.5 36.0 - 46.0 %   MCV 85.7 80.0 - 100.0 fL   MCH 28.0 26.0 - 34.0 pg   MCHC 32.7 30.0 - 36.0 g/dL   RDW 13.0 11.5 - 15.5 %   Platelets 335 150 - 400 K/uL   nRBC 0.0 0.0 - 0.2 %  Wet prep, genital     Status: Abnormal   Collection Time: 04/25/19 12:22 PM   Specimen: Vaginal  Result Value Ref Range   Yeast Wet Prep HPF POC NONE SEEN NONE SEEN   Trich, Wet Prep NONE SEEN NONE SEEN   Clue Cells Wet Prep HPF POC PRESENT (A) NONE SEEN   WBC, Wet Prep HPF POC MANY (A) NONE SEEN   Sperm NONE SEEN    US OB Comp Less 14 Wks  Result Date: 04/25/2019 CLINICAL DATA:  Vaginal bleeding in 1st trimester pregnancy. EXAM: OBSTETRIC <14 WK ULTRASOUND TECHNIQUE: Transabdominal ultrasound was performed for evaluation of the gestation as well as the maternal uterus and adnexal regions. COMPARISON:  None. FINDINGS: Intrauterine gestational sac: Single Yolk sac:  Visualized. Embryo:  Visualized. Cardiac Activity: Visualized. Heart Rate: 164 bpm CRL: 32 mm   10 w 0 d                  [place] EDC: 11/21/2019 Subchorionic hemorrhage: A small subchorionic hemorrhage  is seen in the fundal region. Maternal uterus/adnexae: Both ovaries are normal in appearance. No mass or abnormal free fluid identified. IMPRESSION: Single living IUP with estimated gestational age of [redacted] weeks 0 days, and [place] EDC of 11/21/2019. Small subchorionic hemorrhage noted. Electronically Signed  By: Marlaine Hind M.D.   On: 04/25/2019 13:11   MAU Course  Procedures  MDM   Assessment and Plan   1. Subchorionic hemorrhage of placenta in first trimester, single or unspecified fetus   2. Vaginal bleeding in pregnancy, first trimester   3. [redacted] weeks gestation of pregnancy   4. Intrauterine pregnancy    DC home Comfort measures reviewed  1st Trimester precautions  Bleeding precautions RX: none  Return to MAU as needed FU with OB as planned  Follow-up Information    [facility].   Contact information: [address] [place] [id number] [place] DNP, CNM  04/25/19  2:01 PM

## 2019-04-25 NOTE — MAU Note (Signed)
Came up through ER. Woke up this morning and was bleeding.  Saw it when she wiped. No clots. No pain.

## 2019-04-25 NOTE — Discharge Instructions (Signed)
Subchorionic Hematoma ° °A subchorionic hematoma is a gathering of blood between the outer wall of the embryo (chorion) and the inner wall of the womb (uterus). °This condition can cause vaginal bleeding. If they cause little or no vaginal bleeding, early small hematomas usually shrink on their own and do not affect your baby or pregnancy. When bleeding starts later in pregnancy, or if the hematoma is larger or occurs in older pregnant women, the condition may be more serious. Larger hematomas may get bigger, which increases the chances of miscarriage. This condition also increases the risk of: °· Premature separation of the placenta from the uterus. °· Premature (preterm) labor. °· Stillbirth. °What are the causes? °The exact cause of this condition is not known. It occurs when blood is trapped between the placenta and the uterine wall because the placenta has separated from the original site of implantation. °What increases the risk? °You are more likely to develop this condition if: °· You were treated with fertility medicines. °· You conceived through in vitro fertilization (IVF). °What are the signs or symptoms? °Symptoms of this condition include: °· Vaginal spotting or bleeding. °· Contractions of the uterus. These cause abdominal pain. °Sometimes you may have no symptoms and the bleeding may only be seen when ultrasound images are taken (transvaginal ultrasound). °How is this diagnosed? °This condition is diagnosed based on a physical exam. This includes a pelvic exam. You may also have other tests, including: °· Blood tests. °· Urine tests. °· Ultrasound of the abdomen. °How is this treated? °Treatment for this condition can vary. Treatment may include: °· Watchful waiting. You will be monitored closely for any changes in bleeding. During this stage: °? The hematoma may be reabsorbed by the body. °? The hematoma may separate the fluid-filled space containing the embryo (gestational sac) from the wall of the  womb (endometrium). °· Medicines. °· Activity restriction. This may be needed until the bleeding stops. °Follow these instructions at home: °· Stay on bed rest if told to do so by your health care provider. °· Do not lift anything that is heavier than 10 lbs. (4.5 kg) or as told by your health care provider. °· Do not use any products that contain nicotine or tobacco, such as cigarettes and e-cigarettes. If you need help quitting, ask your health care provider. °· Track and write down the number of pads you use each day and how soaked (saturated) they are. °· Do not use tampons. °· Keep all follow-up visits as told by your health care provider. This is important. Your health care provider may ask you to have follow-up blood tests or ultrasound tests or both. °Contact a health care provider if: °· You have any vaginal bleeding. °· You have a fever. °Get help right away if: °· You have severe cramps in your stomach, back, abdomen, or pelvis. °· You pass large clots or tissue. Save any tissue for your health care provider to look at. °· You have more vaginal bleeding, and you faint or become lightheaded or weak. °Summary °· A subchorionic hematoma is a gathering of blood between the outer wall of the placenta and the uterus. °· This condition can cause vaginal bleeding. °· Sometimes you may have no symptoms and the bleeding may only be seen when ultrasound images are taken. °· Treatment may include watchful waiting, medicines, or activity restriction. °This information is not intended to replace advice given to you by your health care provider. Make sure you discuss any questions you   have with your health care provider. °Document Released: 08/09/2006 Document Revised: 04/06/2017 Document Reviewed: 06/20/2016 °Elsevier Patient Education © 2020 [employer] ° °

## 2019-04-26 LAB — ABO/RH: ABO/RH(D): A POS

## 2019-04-28 LAB — GC/CHLAMYDIA PROBE AMP (~~LOC~~) NOT AT ARMC
Chlamydia: POSITIVE — AB
Comment: NEGATIVE
Comment: NORMAL
Neisseria Gonorrhea: NEGATIVE

## 2019-04-30 ENCOUNTER — Encounter: Payer: Self-pay | Admitting: Women's Health

## 2019-04-30 ENCOUNTER — Other Ambulatory Visit: Payer: Self-pay

## 2019-04-30 ENCOUNTER — Ambulatory Visit (INDEPENDENT_AMBULATORY_CARE_PROVIDER_SITE_OTHER): Payer: 59 | Admitting: Women's Health

## 2019-04-30 DIAGNOSIS — Z3401 Encounter for supervision of normal first pregnancy, first trimester: Secondary | ICD-10-CM

## 2019-04-30 DIAGNOSIS — N760 Acute vaginitis: Secondary | ICD-10-CM | POA: Diagnosis not present

## 2019-04-30 DIAGNOSIS — Z124 Encounter for screening for malignant neoplasm of cervix: Secondary | ICD-10-CM

## 2019-04-30 DIAGNOSIS — Z113 Encounter for screening for infections with a predominantly sexual mode of transmission: Secondary | ICD-10-CM

## 2019-04-30 DIAGNOSIS — Z3A10 10 weeks gestation of pregnancy: Secondary | ICD-10-CM

## 2019-04-30 DIAGNOSIS — N898 Other specified noninflammatory disorders of vagina: Secondary | ICD-10-CM

## 2019-04-30 DIAGNOSIS — O99210 Obesity complicating pregnancy, unspecified trimester: Secondary | ICD-10-CM | POA: Insufficient documentation

## 2019-04-30 DIAGNOSIS — B9689 Other specified bacterial agents as the cause of diseases classified elsewhere: Secondary | ICD-10-CM | POA: Diagnosis not present

## 2019-04-30 NOTE — Patient Instructions (Addendum)
The Maternity Assessment Unit (MAU) is located at the [facility] at [facility]. The address is: [address], [place], [place], [place] [address]. Please see map below for additional directions.    The Maternity Assessment Unit is designed to help you during your pregnancy, and for up to 6 weeks after delivery, with any pregnancy- or postpartum-related emergencies, if you think you are in labor, or if your water has broken. For example, if you experience nausea and vomiting, vaginal bleeding, severe abdominal or pelvic pain, elevated blood pressure or other problems related to your pregnancy or postpartum time, please come to the Maternity Assessment Unit for assistance.   AREA PEDIATRIC/FAMILY PRACTICE PHYSICIANS  [facility] [address] [place], [place] [address] Phone - [phone]   Fax - [phone]  JACK AMOS [address] [place], [place]  [address] Phone - [phone]   Fax - [phone]  [facility] [address] [place], [place]  [address] Phone - [phone]   Fax - [phone]  [facility] [address] [place], [place]  [address] Phone - [phone]   Fax - [phone]  [facility] [address], [address] [place], [place]  [address] Phone - [phone]   Fax - [phone]  [facility] [address] [place], [place]  [address] Phone - [phone]   Fax - [phone]  [facility] [place] [address] [place], [place]  [address] Phone - [phone]   Fax - [phone]  [facility] AT [facility] [address] [place], [address] [place], [place]  [address] Phone - [phone]   Fax - [phone]  [facility] AT [facility] [address] [place], [place]  [address] Phone - [phone]   Fax - [phone] [facility] [place] [address] [place], [place]  [address] Phone - [phone]   Fax - [phone]  [facility] [address] [place], [place]  [address] Phone - [phone]   Fax - [phone]  [facility] [address], [address] [place], [place]  [address] Phone - [phone]   Fax - [phone]  [facility] [place] [address] [place], [place]  [address] Phone - [phone]   Fax - [phone]  [facility] [address], [address] [place], [place]  [address] Phone - [phone]  [facility] [address] [place], [place]  [address] Phone - [phone]   Fax - [phone]  [facility] [address] [place], [place]  [address] Phone - [phone]   Fax - [phone]  [facility] [address] [place], [place]  [address] Phone - [phone]   Fax - [phone]  [facility] [address] [place], [place]  [address] Phone - [phone]   Fax - [phone]   [facility] [address] [place], [place]  [address] Phone - [phone]   Fax - [phone]  [facility] [address] [place], [place]  [address] Phone - [phone]   Fax - [phone]  DAVID RUBIN [address] [place], [place]  [address] Phone - [phone]   Fax - [phone]  [facility] [address] [place], [place]  [address] Phone - [phone]   Fax - [phone]  Lacey Jensen  [address] [place], [place]  [address] Phone - [phone]   Fax - [phone] Gerarda Fraction [phone] [address] [place], [place]  [address] Phone - [phone]   Fax - [phone]  [facility] [address] [place], [place]  [address] Phone - [phone]   Fax - [phone]  [facility] [place] [address] [place], [place]  [address] Phone - [phone]   Fax - [phone]    Second Trimester of Pregnancy The second trimester is from week 14 through week 27 (months 4 through 6). The second trimester is often a time when you feel your best. Your body has adjusted to being pregnant, and you begin to feel better physically. Usually, morning sickness has lessened or quit completely, you may have more energy, and you may have an increase in appetite. The second trimester is also a time when the fetus is growing rapidly. At the end of the sixth month, the fetus is about 9 inches long and weighs about 1 pounds. You will likely begin to feel the baby move (quickening) between 16 and 20 weeks of pregnancy. Body changes during your second trimester Your body continues to go through many changes during your second trimester. The changes vary from woman to woman.  Your weight will continue to increase. You will notice your lower abdomen bulging out.  You may begin to get stretch marks on your hips, abdomen, and breasts.  You may develop headaches that can be relieved by medicines. The medicines should be approved by your health care provider.  You may urinate more often because the fetus is pressing on your bladder.  You may develop or continue to have heartburn as a result of your pregnancy.  You may develop constipation because certain hormones are causing the muscles that push waste through your intestines to slow down.  You may develop hemorrhoids or swollen, bulging veins (varicose veins).  You may have back pain. This is caused by: ? Weight gain. ? Pregnancy hormones that are relaxing the joints in your pelvis. ? A shift in weight and the muscles that support your balance.  Your breasts will continue to grow and they will continue to become tender.  Your gums may bleed and may be sensitive to brushing and flossing.  Dark spots or blotches (chloasma, mask of  pregnancy) may develop on your face. This will likely fade after the baby is born.  A dark line from your belly button to the pubic area (linea nigra) may appear. This will likely fade after the baby is born.  You may have changes in your hair. These can include thickening of your hair, rapid growth, and changes in texture. Some women also have hair loss during or after pregnancy, or hair that feels dry or thin. Your hair will most likely return to normal after your baby is born. What to expect at prenatal visits During a routine prenatal visit:  You will be weighed to make sure you and the fetus are growing normally.  Your blood pressure will be taken.  Your abdomen will be measured to track your baby's growth.  The fetal heartbeat will be listened to.  Any test results from the previous visit will be discussed. Your health care provider may ask you:  How you are feeling.  If you are feeling the baby move.  If you have had any abnormal symptoms, such as leaking fluid, bleeding, severe headaches, or abdominal cramping.  If you are using any tobacco products, including cigarettes, chewing tobacco, and electronic cigarettes.  If you have any questions. Other tests that may be performed during your second trimester include:  Blood tests that check for: ? Low iron levels (anemia). ? High blood sugar that affects pregnant women (gestational diabetes) between 64 and 28 weeks. ? Rh antibodies. This is to check for a protein on red blood cells (Rh factor).  Urine tests to check for infections, diabetes, or protein in the urine.  An ultrasound to confirm the proper growth and development of the baby.  An amniocentesis to check for possible genetic problems.  Fetal screens for spina bifida and Down syndrome.  HIV (human immunodeficiency virus) testing. Routine prenatal testing includes screening for HIV, unless you choose not to have this test. Follow these instructions at  home: Medicines  Follow your health care provider's instructions regarding medicine use. Specific medicines may be either safe or unsafe to take during pregnancy.  Take a prenatal vitamin that contains at least 600 micrograms (mcg) of folic acid.  If you develop constipation, try taking a stool softener if your health care provider approves. Eating and drinking   Eat a balanced diet that includes fresh fruits and vegetables, whole grains, good sources of protein such as meat, eggs, or tofu, and low-fat dairy. Your health care provider will help you determine the amount of weight gain that is right for you.  Avoid raw meat and uncooked cheese. These carry germs that can cause birth defects in the baby.  If you have low calcium intake from food, talk to your health care provider about whether you should take a daily calcium supplement.  Limit foods that are high in fat and processed sugars, such as fried and sweet foods.  To prevent constipation: ? Drink enough fluid to keep your urine clear or pale yellow. ? Eat foods that are high in fiber, such as fresh fruits and vegetables, whole grains, and beans. Activity  Exercise only as directed by your health care provider. Most women can continue their usual exercise routine during pregnancy. Try to exercise for 30 minutes at least 5 days a week. Stop exercising if you experience uterine contractions.  Avoid heavy lifting, wear low heel shoes, and practice good posture.  A sexual relationship may be continued unless your health care provider directs you otherwise. Relieving pain and discomfort  Wear a good support bra to prevent discomfort from breast tenderness.  Take warm sitz baths to soothe any pain or discomfort caused by hemorrhoids. Use hemorrhoid cream if your health care provider approves.  Rest with your legs elevated if you have leg cramps or low back pain.  If you develop varicose veins, wear support hose. Elevate your feet  for 15 minutes, 3-4 times a day. Limit salt in your diet. Prenatal Care  Write down your questions. Take them to your prenatal visits.  Keep all your prenatal visits as told by your health care provider. This is important. Safety  Wear your seat belt at all times when driving.  Make a list of emergency phone numbers, including numbers for family, friends, the hospital, and police and fire departments. General instructions  Ask your health care provider for a referral to a local prenatal education class. Begin classes no later than the beginning of month 6 of your pregnancy.  Ask for help if you have counseling or nutritional needs during pregnancy. Your health care provider can offer advice or refer you to specialists  for help with various needs.  Do not use hot tubs, steam rooms, or saunas.  Do not douche or use tampons or scented sanitary pads.  Do not cross your legs for long periods of time.  Avoid cat litter boxes and soil used by cats. These carry germs that can cause birth defects in the baby and possibly loss of the fetus by miscarriage or stillbirth.  Avoid all smoking, herbs, alcohol, and unprescribed drugs. Chemicals in these products can affect the formation and growth of the baby.  Do not use any products that contain nicotine or tobacco, such as cigarettes and e-cigarettes. If you need help quitting, ask your health care provider.  Visit your dentist if you have not gone yet during your pregnancy. Use a soft toothbrush to brush your teeth and be gentle when you floss. Contact a health care provider if:  You have dizziness.  You have mild pelvic cramps, pelvic pressure, or nagging pain in the abdominal area.  You have persistent nausea, vomiting, or diarrhea.  You have a bad smelling vaginal discharge.  You have pain when you urinate. Get help right away if:  You have a fever.  You are leaking fluid from your vagina.  You have spotting or bleeding from your  vagina.  You have severe abdominal cramping or pain.  You have rapid weight gain or weight loss.  You have shortness of breath with chest pain.  You notice sudden or extreme swelling of your face, hands, ankles, feet, or legs.  You have not felt your baby move in over an hour.  You have severe headaches that do not go away when you take medicine.  You have vision changes. Summary  The second trimester is from week 14 through week 27 (months 4 through 6). It is also a time when the fetus is growing rapidly.  Your body goes through many changes during pregnancy. The changes vary from woman to woman.  Avoid all smoking, herbs, alcohol, and unprescribed drugs. These chemicals affect the formation and growth your baby.  Do not use any tobacco products, such as cigarettes, chewing tobacco, and e-cigarettes. If you need help quitting, ask your health care provider.  Contact your health care provider if you have any questions. Keep all prenatal visits as told by your health care provider. This is important. This information is not intended to replace advice given to you by your health care provider. Make sure you discuss any questions you have with your health care provider. Document Released: 04/18/2001 Document Revised: 08/16/2018 Document Reviewed: 05/30/2016 Elsevier Patient Education  2020 [place] of Pregnancy The first trimester of pregnancy is from week 1 until the end of week 13 (months 1 through 3). A week after a sperm fertilizes an egg, the egg will implant on the wall of the uterus. This embryo will begin to develop into a baby. Genes from you and your partner will form the baby. The female genes will determine whether the baby will be a boy or a girl. At 6-8 weeks, the eyes and face will be formed, and the heartbeat can be seen on ultrasound. At the end of 12 weeks, all the baby's organs will be formed. Now that you are pregnant, you will want to do  everything you can to have a healthy baby. Two of the most important things are to get good prenatal care and to follow your health care provider's instructions. Prenatal care is all the medical care you  receive before the baby's birth. This care will help prevent, find, and treat any problems during the pregnancy and childbirth. Body changes during your first trimester Your body goes through many changes during pregnancy. The changes vary from woman to woman.  You may gain or lose a couple of pounds at first.  You may feel sick to your stomach (nauseous) and you may throw up (vomit). If the vomiting is uncontrollable, call your health care provider.  You may tire easily.  You may develop headaches that can be relieved by medicines. All medicines should be approved by your health care provider.  You may urinate more often. Painful urination may mean you have a bladder infection.  You may develop heartburn as a result of your pregnancy.  You may develop constipation because certain hormones are causing the muscles that push stool through your intestines to slow down.  You may develop hemorrhoids or swollen veins (varicose veins).  Your breasts may begin to grow larger and become tender. Your nipples may stick out more, and the tissue that surrounds them (areola) may become darker.  Your gums may bleed and may be sensitive to brushing and flossing.  Dark spots or blotches (chloasma, mask of pregnancy) may develop on your face. This will likely fade after the baby is born.  Your menstrual periods will stop.  You may have a loss of appetite.  You may develop cravings for certain kinds of food.  You may have changes in your emotions from day to day, such as being excited to be pregnant or being concerned that something may go wrong with the pregnancy and baby.  You may have more vivid and strange dreams.  You may have changes in your hair. These can include thickening of your hair,  rapid growth, and changes in texture. Some women also have hair loss during or after pregnancy, or hair that feels dry or thin. Your hair will most likely return to normal after your baby is born. What to expect at prenatal visits During a routine prenatal visit:  You will be weighed to make sure you and the baby are growing normally.  Your blood pressure will be taken.  Your abdomen will be measured to track your baby's growth.  The fetal heartbeat will be listened to between weeks 10 and 14 of your pregnancy.  Test results from any previous visits will be discussed. Your health care provider may ask you:  How you are feeling.  If you are feeling the baby move.  If you have had any abnormal symptoms, such as leaking fluid, bleeding, severe headaches, or abdominal cramping.  If you are using any tobacco products, including cigarettes, chewing tobacco, and electronic cigarettes.  If you have any questions. Other tests that may be performed during your first trimester include:  Blood tests to find your blood type and to check for the presence of any previous infections. The tests will also be used to check for low iron levels (anemia) and protein on red blood cells (Rh antibodies). Depending on your risk factors, or if you previously had diabetes during pregnancy, you may have tests to check for high blood sugar that affects pregnant women (gestational diabetes).  Urine tests to check for infections, diabetes, or protein in the urine.  An ultrasound to confirm the proper growth and development of the baby.  Fetal screens for spinal cord problems (spina bifida) and Down syndrome.  HIV (human immunodeficiency virus) testing. Routine prenatal testing includes screening for HIV,  unless you choose not to have this test.  You may need other tests to make sure you and the baby are doing well. Follow these instructions at home: Medicines  Follow your health care provider's instructions  regarding medicine use. Specific medicines may be either safe or unsafe to take during pregnancy.  Take a prenatal vitamin that contains at least 600 micrograms (mcg) of folic acid.  If you develop constipation, try taking a stool softener if your health care provider approves. Eating and drinking   Eat a balanced diet that includes fresh fruits and vegetables, whole grains, good sources of protein such as meat, eggs, or tofu, and low-fat dairy. Your health care provider will help you determine the amount of weight gain that is right for you.  Avoid raw meat and uncooked cheese. These carry germs that can cause birth defects in the baby.  Eating four or five small meals rather than three large meals a day may help relieve nausea and vomiting. If you start to feel nauseous, eating a few soda crackers can be helpful. Drinking liquids between meals, instead of during meals, also seems to help ease nausea and vomiting.  Limit foods that are high in fat and processed sugars, such as fried and sweet foods.  To prevent constipation: ? Eat foods that are high in fiber, such as fresh fruits and vegetables, whole grains, and beans. ? Drink enough fluid to keep your urine clear or pale yellow. Activity  Exercise only as directed by your health care provider. Most women can continue their usual exercise routine during pregnancy. Try to exercise for 30 minutes at least 5 days a week. Exercising will help you: ? Control your weight. ? Stay in shape. ? Be prepared for labor and delivery.  Experiencing pain or cramping in the lower abdomen or lower back is a good sign that you should stop exercising. Check with your health care provider before continuing with normal exercises.  Try to avoid standing for long periods of time. Move your legs often if you must stand in one place for a long time.  Avoid heavy lifting.  Wear low-heeled shoes and practice good posture.  You may continue to have sex  unless your health care provider tells you not to. Relieving pain and discomfort  Wear a good support bra to relieve breast tenderness.  Take warm sitz baths to soothe any pain or discomfort caused by hemorrhoids. Use hemorrhoid cream if your health care provider approves.  Rest with your legs elevated if you have leg cramps or low back pain.  If you develop varicose veins in your legs, wear support hose. Elevate your feet for 15 minutes, 3-4 times a day. Limit salt in your diet. Prenatal care  Schedule your prenatal visits by the twelfth week of pregnancy. They are usually scheduled monthly at first, then more often in the last 2 months before delivery.  Write down your questions. Take them to your prenatal visits.  Keep all your prenatal visits as told by your health care provider. This is important. Safety  Wear your seat belt at all times when driving.  Make a list of emergency phone numbers, including numbers for family, friends, the hospital, and police and fire departments. General instructions  Ask your health care provider for a referral to a local prenatal education class. Begin classes no later than the beginning of month 6 of your pregnancy.  Ask for help if you have counseling or nutritional needs during pregnancy. Your  health care provider can offer advice or refer you to specialists for help with various needs.  Do not use hot tubs, steam rooms, or saunas.  Do not douche or use tampons or scented sanitary pads.  Do not cross your legs for long periods of time.  Avoid cat litter boxes and soil used by cats. These carry germs that can cause birth defects in the baby and possibly loss of the fetus by miscarriage or stillbirth.  Avoid all smoking, herbs, alcohol, and medicines not prescribed by your health care provider. Chemicals in these products affect the formation and growth of the baby.  Do not use any products that contain nicotine or tobacco, such as  cigarettes and e-cigarettes. If you need help quitting, ask your health care provider. You may receive counseling support and other resources to help you quit.  Schedule a dentist appointment. At home, brush your teeth with a soft toothbrush and be gentle when you floss. Contact a health care provider if:  You have dizziness.  You have mild pelvic cramps, pelvic pressure, or nagging pain in the abdominal area.  You have persistent nausea, vomiting, or diarrhea.  You have a bad smelling vaginal discharge.  You have pain when you urinate.  You notice increased swelling in your face, hands, legs, or ankles.  You are exposed to fifth disease or chickenpox.  You are exposed to [place] measles (rubella) and have never had it. Get help right away if:  You have a fever.  You are leaking fluid from your vagina.  You have spotting or bleeding from your vagina.  You have severe abdominal cramping or pain.  You have rapid weight gain or loss.  You vomit blood or material that looks like coffee grounds.  You develop a severe headache.  You have shortness of breath.  You have any kind of trauma, such as from a fall or a car accident. Summary  The first trimester of pregnancy is from week 1 until the end of week 13 (months 1 through 3).  Your body goes through many changes during pregnancy. The changes vary from woman to woman.  You will have routine prenatal visits. During those visits, your health care provider will examine you, discuss any test results you may have, and talk with you about how you are feeling. This information is not intended to replace advice given to you by your health care provider. Make sure you discuss any questions you have with your health care provider. Document Released: 04/18/2001 Document Revised: 04/06/2017 Document Reviewed: 04/05/2016 Elsevier Patient Education  2020 [employer].                   Safe Medications in Pregnancy    Acne: Benzoyl  Peroxide Salicylic Acid  Backache/Headache: Tylenol: 2 regular strength every 4 hours OR              2 Extra strength every 6 hours  Colds/Coughs/Allergies: Benadryl (alcohol free) 25 mg every 6 hours as needed Breath right strips Claritin Cepacol throat lozenges Chloraseptic throat spray Cold-Eeze- up to three times per day Cough drops, alcohol free Flonase (by prescription only) Guaifenesin Mucinex Robitussin DM (plain only, alcohol free) Saline nasal spray/drops Sudafed (pseudoephedrine) & Actifed ** use only after [redacted] weeks gestation and if you do not have high blood pressure Tylenol Vicks Vaporub Zinc lozenges Zyrtec   Constipation: Colace Ducolax suppositories Fleet enema Glycerin suppositories Metamucil Milk of magnesia Miralax Senokot Smooth move tea  Diarrhea: Kaopectate  Imodium A-D  *NO pepto Bismol  Hemorrhoids: Anusol Anusol HC Preparation H Tucks  Indigestion: Tums Maalox Mylanta Zantac  Pepcid  Insomnia: Benadryl (alcohol free)  every 6 hours as needed Tylenol PM Unisom, no Gelcaps  Leg Cramps: Tums MagGel  Nausea/Vomiting:  Bonine Dramamine Emetrol Ginger extract Sea bands Meclizine  Nausea medication to take during pregnancy:  Unisom (doxylamine succinate 25 mg tablets) Take one tablet daily at bedtime. If symptoms are not adequately controlled, the dose can be increased to a maximum recommended dose of two tablets daily (1/2 tablet in the morning, 1/2 tablet mid-afternoon and one at bedtime). Vitamin B6  tablets. Take one tablet twice a day (up to 200 mg per day).  Skin Rashes: Aveeno products Benadryl cream or  every 6 hours as needed Calamine Lotion 1% cortisone cream  Yeast infection: Gyne-lotrimin 7 Monistat 7   **If taking multiple medications, please check labels to avoid duplicating the same active ingredients **take medication as directed on the label ** Do not exceed 4000 mg of tylenol in  24 hours **Do not take medications that contain aspirin or ibuprofen     Pap Test Why am I having this test? A Pap test, also called a Pap smear, is a screening test to check for signs of:  Cancer of the vagina, cervix, and uterus. The cervix is the lower part of the uterus that opens into the vagina.  Infection.  Changes that may be a sign that cancer is developing (precancerous changes). Women need this test on a regular basis. In general, you should have a Pap test every 3 years until you reach menopause or age 63. Women aged 30-60 may choose to have their Pap test done at the same time as an HPV (human papillomavirus) test every 5 years (instead of every 3 years). Your health care provider may recommend having Pap tests more or less often depending on your medical conditions and past Pap test results. What kind of sample is taken?  Your health care provider will collect a sample of cells from the surface of your cervix. This will be done using a small cotton swab, plastic spatula, or brush. This sample is often collected during a pelvic exam, when you are lying on your back on an exam table with feet in footrests (stirrups). In some cases, fluids (secretions) from the cervix or vagina may also be collected. How do I prepare for this test?  Be aware of where you are in your menstrual cycle. If you are menstruating on the day of the test, you may be asked to reschedule.  You may need to reschedule if you have a known vaginal infection on the day of the test.  Follow instructions from your health care provider about: ? Changing or stopping your regular medicines. Some medicines can cause abnormal test results, such as digitalis and tetracycline. ? Avoiding douching or taking a bath the day before or the day of the test. Tell a health care provider about:  Any allergies you have.  All medicines you are taking, including vitamins, herbs, eye drops, creams, and over-the-counter  medicines.  Any blood disorders you have.  Any surgeries you have had.  Any medical conditions you have.  Whether you are pregnant or may be pregnant. How are the results reported? Your test results will be reported as either abnormal or normal. A false-positive result can occur. A false positive is incorrect because it means that a condition is present when it is not.  A false-negative result can occur. A false negative is incorrect because it means that a condition is not present when it is. What do the results mean? A normal test result means that you do not have signs of cancer of the vagina, cervix, or uterus. An abnormal result may mean that you have:  Cancer. A Pap test by itself is not enough to diagnose cancer. You will have more tests done in this case.  Precancerous changes in your vagina, cervix, or uterus.  Inflammation of the cervix.  An STD (sexually transmitted disease).  A fungal infection.  A parasite infection. Talk with your health care provider about what your results mean. Questions to ask your health care provider Ask your health care provider, or the department that is doing the test:  When will my results be ready?  How will I get my results?  What are my treatment options?  What other tests do I need?  What are my next steps? Summary  In general, women should have a Pap test every 3 years until they reach menopause or age 21.  Your health care provider will collect a sample of cells from the surface of your cervix. This will be done using a small cotton swab, plastic spatula, or brush.  In some cases, fluids (secretions) from the cervix or vagina may also be collected. This information is not intended to replace advice given to you by your health care provider. Make sure you discuss any questions you have with your health care provider. Document Released: 07/15/2002 Document Revised: 01/01/2017 Document Reviewed: 01/01/2017 Elsevier Patient  Education  2020 [employer]

## 2019-04-30 NOTE — Progress Notes (Addendum)
History:   Martha Hall is a 23 y.o. G1P0000 at 42w3dby LMP/early [place] seen today for her first obstetrical visit.  Her obstetrical history is significant for none - first pregnancy.. Patient does intend to breast feed. Pregnancy history fully reviewed.  Pt reports this is a desired and planned pregnancy. Allergies: NKDA Current Medications: PNVs PMH: none. No HTN, DM, asthma. PSH: brain surgery at birth to place stent, which is still in place and never removed, but pt denies any current concerns OB Hx: first pregnancy Social Hx: pt does not smoke, drink, or use drugs. Family Hx: none Pt declines flu vaccine.  Patient reports nausea.     HISTORY: OB History  Gravida Para Term Preterm AB Living  1 0 0 0 0 0  SAB TAB Ectopic Multiple Live Births  0 0 0 0 0    # Outcome Date GA Lbr Len/2nd Weight Sex Delivery Anes PTL Lv  1 Current             Pt has never had a Pap, will do today.  Past Medical History:  Diagnosis Date  . Infection    UTI   Past Surgical History:  Procedure Laterality Date  . BRAIN SURGERY  01999/03/24  stint placed when born   Family History  Problem Relation Age of Onset  . Healthy Mother   . Healthy Father    Social History   Tobacco Use  . Smoking status: Never Smoker  . Smokeless tobacco: Never Used  Substance Use Topics  . Alcohol use: Never  . Drug use: Never   No Known Allergies Current Outpatient Medications on File Prior to Visit  Medication Sig Dispense Refill  . Blood Pressure Monitoring (BLOOD PRESSURE KIT) DEVI 1 kit by Does not apply route once a week. Check BP regularly and record readings into the Babyscripts App.  Large Cuff.  DX O90.0 1 each 0  . cetirizine-pseudoephedrine (ZYRTEC-D) 5-120 MG tablet Take 1 tablet by mouth daily. (Patient not taking: Reported on 04/23/2019) 30 tablet 0  . fluticasone (FLONASE) 50 MCG/ACT nasal spray Place 2 sprays into both nostrils daily. (Patient not taking: Reported on 04/23/2019)  16 g 0  . Prenatal 27-1 MG TABS Take 1 tablet by mouth daily.    . Prenatal Vit-Fe Fumarate-FA (PRENATAL VITAMIN) 27-0.8 MG TABS Take 1 tablet by mouth daily. 30 tablet 11   No current facility-administered medications on file prior to visit.    Review of Systems Pertinent items noted in HPI and remainder of comprehensive ROS otherwise negative. Physical Exam:   Vitals:   04/30/19 1006  Weight: 230 lb (104.3 kg)   Fetal Heart Rate (bpm): 160 Uterus:    normal, gravid  Pelvic Exam: Perineum: no hemorrhoids, normal perineum   Vulva: normal external genitalia, no lesions   Vagina:  normal mucosa, normal discharge   Cervix: no lesions and normal, pap smear done.    Adnexa: normal adnexa and no mass, fullness, tenderness   Bony Pelvis: average  System: General: well-developed, well-nourished female in no acute distress   Breasts:  normal appearance, no masses or tenderness bilaterally   Skin: normal coloration and turgor, no rashes   Neurologic: oriented, normal, negative, normal mood   Extremities: normal strength, tone, and muscle mass, ROM of all joints is normal   HEENT PERRLA, extraocular movement intact and sclera clear, anicteric   Mouth/Teeth mucous membranes moist, pharynx normal without lesions and dental hygiene good   Neck supple  and no masses   Cardiovascular: regular rate and rhythm   Respiratory:  no respiratory distress, normal breath sounds   Abdomen: soft, non-tender; bowel sounds normal; no masses,  no organomegaly  FHR: 160     Assessment:    Pregnancy: G1P0000 Patient Active Problem List   Diagnosis Date Noted  . Obesity in pregnancy 04/30/2019  . Supervision of normal pregnancy, antepartum 04/23/2019     Plan:    1. Encounter for supervision of normal first pregnancy in first trimester - peds list given - anatomy scan scheduled - Cervicovaginal ancillary only( [place]) - Cytology - PAP( [place]) - Babyscripts Schedule Optimization - Urine  Culture - HgB A1c - Genetic Screening - Obstetric Panel, Including HIV  Initial labs drawn. Continue prenatal vitamins. Genetic Screening discussed, NIPS: requested. Ultrasound discussed; fetal anatomic survey: requested. Problem list reviewed and updated. The nature of [facility] with multiple MDs and other Advanced Practice Providers was explained to patient; also emphasized that residents, students are part of our team. Routine obstetric precautions reviewed. Return in about 4 weeks (around 05/28/2019) for virtual ROB, schedule anatomy scan 18-20wks.     Vernice Jefferson, [facility] for [employer] [place] [facility]

## 2019-05-01 LAB — CERVICOVAGINAL ANCILLARY ONLY
Bacterial Vaginitis (gardnerella): POSITIVE — AB
Candida Glabrata: NEGATIVE
Candida Vaginitis: NEGATIVE
Chlamydia: POSITIVE — AB
Comment: NEGATIVE
Comment: NEGATIVE
Comment: NEGATIVE
Comment: NEGATIVE
Comment: NEGATIVE
Comment: NORMAL
Neisseria Gonorrhea: NEGATIVE
Trichomonas: POSITIVE — AB

## 2019-05-02 LAB — URINE CULTURE

## 2019-05-02 NOTE — Progress Notes (Signed)
Hello - patient tested positive for chlamydia and trichomonas as well as bacterial vaginosis. Please call her and send treatment per protocol. Can treat trichomonas and BV with same regimen of metronidazole 500mg  BID x7days if patient verbalizes compliance, otherwise send single dose regimen for trichomonas and can complete treatment for BV at a later time. Thank you, Elmyra Ricks

## 2019-05-05 ENCOUNTER — Telehealth: Payer: Self-pay | Admitting: *Deleted

## 2019-05-05 DIAGNOSIS — A749 Chlamydial infection, unspecified: Secondary | ICD-10-CM

## 2019-05-05 DIAGNOSIS — B9689 Other specified bacterial agents as the cause of diseases classified elsewhere: Secondary | ICD-10-CM

## 2019-05-05 DIAGNOSIS — A599 Trichomoniasis, unspecified: Secondary | ICD-10-CM

## 2019-05-05 MED ORDER — METRONIDAZOLE 500 MG PO TABS: 500.0000 mg | ORAL_TABLET | Freq: Two times a day (BID) | ORAL | 0 refills | Status: DC

## 2019-05-05 MED ORDER — AZITHROMYCIN 500 MG PO TABS: ORAL_TABLET | ORAL | 0 refills | Status: DC

## 2019-05-05 NOTE — Telephone Encounter (Signed)
Patient returned call, notified her of positive chlamydia, trich and BV.  Rx routed to pharmacy.  Instructed patient to notify her partner for treatment.    Report [facility]ed to [facility].

## 2019-05-05 NOTE — Telephone Encounter (Signed)
-----   Message from Clarisa Fling, NP sent at 05/02/2019  8:52 AM EST ----- Hello - patient tested positive for chlamydia and trichomonas as well as bacterial vaginosis. Please call her and send treatment per protocol. Can treat trichomonas and BV with same regimen of metronidazole 500mg  BID x7days if patient verbalizes compliance, otherwise send single dose regimen for trichomonas and can complete treatment for BV at a later time. Thank you, Elmyra Ricks

## 2019-05-06 LAB — OBSTETRIC PANEL, INCLUDING HIV
Basophils Absolute: 0 10*3/uL (ref 0.0–0.2)
Basos: 1 %
EOS (ABSOLUTE): 0.1 10*3/uL (ref 0.0–0.4)
Eos: 1 %
HIV Screen 4th Generation wRfx: NONREACTIVE
Hematocrit: 39 % (ref 34.0–46.6)
Hemoglobin: 12.7 g/dL (ref 11.1–15.9)
Hepatitis B Surface Ag: NEGATIVE
Immature Grans (Abs): 0 10*3/uL (ref 0.0–0.1)
Immature Granulocytes: 0 %
Lymphocytes Absolute: 1.2 10*3/uL (ref 0.7–3.1)
Lymphs: 19 %
MCH: 27.3 pg (ref 26.6–33.0)
MCHC: 32.6 g/dL (ref 31.5–35.7)
MCV: 84 fL (ref 79–97)
Monocytes Absolute: 0.5 10*3/uL (ref 0.1–0.9)
Monocytes: 8 %
Neutrophils Absolute: 4.5 10*3/uL (ref 1.4–7.0)
Neutrophils: 71 %
Platelets: 329 10*3/uL (ref 150–450)
RBC: 4.66 x10E6/uL (ref 3.77–5.28)
RDW: 12.8 % (ref 11.7–15.4)
RPR Ser Ql: NONREACTIVE
Rh Factor: POSITIVE
Rubella Antibodies, IGG: 15.2 index (ref >0.99)
WBC: 6.3 10*3/uL (ref 3.4–10.8)

## 2019-05-06 LAB — CYTOLOGY - PAP: Diagnosis: NEGATIVE

## 2019-05-06 LAB — AB SCR+ANTIBODY ID: Antibody Screen: POSITIVE — AB

## 2019-05-06 LAB — HEMOGLOBIN A1C
Est. average glucose Bld gHb Est-mCnc: 114 mg/dL
Hgb A1c MFr Bld: 5.6 % (ref 4.8–5.6)

## 2019-05-09 ENCOUNTER — Encounter: Payer: Self-pay | Admitting: Women's Health

## 2019-05-09 DIAGNOSIS — O361990 Maternal care for other isoimmunization, unspecified trimester, not applicable or unspecified: Secondary | ICD-10-CM | POA: Insufficient documentation

## 2019-05-09 DIAGNOSIS — A5901 Trichomonal vulvovaginitis: Secondary | ICD-10-CM | POA: Insufficient documentation

## 2019-05-09 DIAGNOSIS — O98819 Other maternal infectious and parasitic diseases complicating pregnancy, unspecified trimester: Secondary | ICD-10-CM | POA: Insufficient documentation

## 2019-05-09 DIAGNOSIS — A749 Chlamydial infection, unspecified: Secondary | ICD-10-CM | POA: Insufficient documentation

## 2019-05-09 HISTORY — DX: Trichomonal vulvovaginitis: A59.01

## 2019-05-09 HISTORY — DX: Maternal care for other isoimmunization, unspecified trimester, not applicable or unspecified: O36.1990

## 2019-05-09 NOTE — L&D Delivery Note (Signed)
OB/GYN Faculty Practice Delivery Note  Martha Hall is a 24 y.o. G1P0000 s/p vag del at [redacted]w[redacted]d. She was admitted for IOL due to GDMA2.   ROM: 14h 55m with clear fluid GBS Status: neg Maximum Maternal Temperature: 99.9  Labor Progress: Marland Kitchen [name] was admitted for IOL at 39.0wks due to [facility]. She underwent the usual cervical ripening procedures followed by Pitocin, AROM, and epidural placement. She delivered approx 40h after the induction began, and pushed for a little less than 2h.  Delivery Date/Time: November 18, 2019 at 0143 Delivery: Called to room and patient was complete and pushing. Head delivered LOA. No nuchal cord present. Shoulder and body delivered in usual fashion. Infant with spontaneous cry, placed on mother's abdomen, dried and stimulated. Cord clamped x 2 after 1-minute delay, and cut by mother of pt. Cord blood drawn. Placenta delivered spontaneously with gentle cord traction. Fundus firm with massage and Pitocin. Labia, perineum, vagina, and cervix inspected and found to be intact.   Placenta: spont, intact, 3VC Complications: none Lacerations: none EBL: 350cc Analgesia: epidural  Postpartum Planning [x]  message to sent to schedule follow-up    Infant: female  APGARs 8/9  3685g (8lb 2oz)  10/9, CNM  11/18/2019 2:30 AM

## 2019-05-12 ENCOUNTER — Encounter: Payer: Self-pay | Admitting: Women's Health

## 2019-05-12 DIAGNOSIS — D563 Thalassemia minor: Secondary | ICD-10-CM | POA: Insufficient documentation

## 2019-05-28 ENCOUNTER — Encounter: Payer: Self-pay | Admitting: Obstetrics and Gynecology

## 2019-05-28 ENCOUNTER — Telehealth (INDEPENDENT_AMBULATORY_CARE_PROVIDER_SITE_OTHER): Payer: 59 | Admitting: Obstetrics and Gynecology

## 2019-05-28 DIAGNOSIS — Z3480 Encounter for supervision of other normal pregnancy, unspecified trimester: Secondary | ICD-10-CM

## 2019-05-28 DIAGNOSIS — D563 Thalassemia minor: Secondary | ICD-10-CM

## 2019-05-28 DIAGNOSIS — O361990 Maternal care for other isoimmunization, unspecified trimester, not applicable or unspecified: Secondary | ICD-10-CM

## 2019-05-28 DIAGNOSIS — Z3A14 14 weeks gestation of pregnancy: Secondary | ICD-10-CM

## 2019-05-28 DIAGNOSIS — O98819 Other maternal infectious and parasitic diseases complicating pregnancy, unspecified trimester: Secondary | ICD-10-CM

## 2019-05-28 DIAGNOSIS — A5901 Trichomonal vulvovaginitis: Secondary | ICD-10-CM

## 2019-05-28 DIAGNOSIS — O99212 Obesity complicating pregnancy, second trimester: Secondary | ICD-10-CM

## 2019-05-28 DIAGNOSIS — Z86718 Personal history of other venous thrombosis and embolism: Secondary | ICD-10-CM

## 2019-05-28 DIAGNOSIS — A749 Chlamydial infection, unspecified: Secondary | ICD-10-CM

## 2019-05-28 DIAGNOSIS — O361920 Maternal care for other isoimmunization, second trimester, not applicable or unspecified: Secondary | ICD-10-CM

## 2019-05-28 DIAGNOSIS — O99210 Obesity complicating pregnancy, unspecified trimester: Secondary | ICD-10-CM

## 2019-05-28 MED ORDER — ASPIRIN EC 81 MG PO TBEC: 81.0000 mg | DELAYED_RELEASE_TABLET | Freq: Every day | ORAL | 2 refills | Status: DC

## 2019-05-28 NOTE — Progress Notes (Signed)
Virtual Rob   CC: None    Pt Ins does not cover Blood Pressure kit.

## 2019-05-28 NOTE — Progress Notes (Signed)
   TELEHEALTH OBSTETRICS PRENATAL VIRTUAL VIDEO VISIT ENCOUNTER NOTE  Provider location: Center for [facility] at [place]   I connected with Martha Hall on 05/28/19 at  8:30 AM EST by MyChart Video Encounter at home and verified that I am speaking with the correct person using two identifiers.   I discussed the limitations, risks, security and privacy concerns of performing an evaluation and management service virtually and the availability of in person appointments. I also discussed with the patient that there may be a patient responsible charge related to this service. The patient expressed understanding and agreed to proceed. Subjective:  Martha Hall is a 24 y.o. G1P0000 at [redacted]w[redacted]d being seen today for ongoing prenatal care.  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  Patient reports sinus pressure.  Contractions: Not present. Vag. Bleeding: None.  Movement: Present. Denies any leaking of fluid.   The following portions of the patient's history were reviewed and updated as appropriate: allergies, current medications, past family history, past medical history, past social history, past surgical history and problem list.   Objective:  There were no vitals filed for this visit.  Fetal Status:     Movement: Present     General:  Alert, oriented and cooperative. Patient is in no acute distress.  Respiratory: Normal respiratory effort, no problems with respiration noted  Mental Status: Normal mood and affect. Normal behavior. Normal judgment and thought content.  Rest of physical exam deferred due to type of encounter  Imaging: No results found.  Assessment and Plan:    Pregnancy: G1P0000 at [redacted]w[redacted]d  1. Alpha thalassemia silent carrier For genetic counseling  2. Lewis isoimmunization during pregnancy, antepartum, single or unspecified fetus No need titers  3. Vaginal trichomoniasis Needs TOC  4. Chlamydia infection affecting pregnancy, antepartum Needs TOC  5. Obesity in pregnancy Start ASA  6. Supervision of other normal pregnancy, antepartum  7. 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 I discussed the assessment and treatment plan with the patient. The patient was provided an opportunity to ask questions and all were answered. The patient agreed with the plan and demonstrated an understanding of the instructions. The patient was advised to call back or seek an in-person office evaluation/go to MAU at [facility] for any urgent or concerning symptoms. Please refer to After Visit Summary for other counseling recommendations.   I provided 15 minutes of face-to-face time during this encounter.  Return in about 4 weeks (around 06/25/2019) for high OB, virtual.  Future Appointments  Date Time Provider Department Center  06/30/2019  9:30 AM WH-MFC 07/02/2019 1 WH-MFCUS MFC-US    [place], MD Center for [facility], [facility]

## 2019-06-11 ENCOUNTER — Other Ambulatory Visit: Payer: Self-pay

## 2019-06-11 ENCOUNTER — Other Ambulatory Visit (HOSPITAL_COMMUNITY)
Admission: RE | Admit: 2019-06-11 | Discharge: 2019-06-11 | Disposition: A | Discharge status: Home / Self Care | Payer: 59 | Source: Ambulatory Visit | Attending: Obstetrics | Admitting: Obstetrics

## 2019-06-11 ENCOUNTER — Encounter: Payer: Self-pay | Admitting: Obstetrics and Gynecology

## 2019-06-11 ENCOUNTER — Ambulatory Visit (INDEPENDENT_AMBULATORY_CARE_PROVIDER_SITE_OTHER): Payer: 59

## 2019-06-11 VITALS — BP 134/80 | HR 98 | Ht 61.0 in | Wt 229.0 lb

## 2019-06-11 DIAGNOSIS — O98819 Other maternal infectious and parasitic diseases complicating pregnancy, unspecified trimester: Secondary | ICD-10-CM | POA: Diagnosis not present

## 2019-06-11 DIAGNOSIS — A599 Trichomoniasis, unspecified: Secondary | ICD-10-CM

## 2019-06-11 DIAGNOSIS — Z3A16 16 weeks gestation of pregnancy: Secondary | ICD-10-CM

## 2019-06-11 DIAGNOSIS — O98812 Other maternal infectious and parasitic diseases complicating pregnancy, second trimester: Secondary | ICD-10-CM

## 2019-06-11 DIAGNOSIS — A749 Chlamydial infection, unspecified: Secondary | ICD-10-CM | POA: Insufficient documentation

## 2019-06-11 DIAGNOSIS — Z8669 Personal history of other diseases of the nervous system and sense organs: Secondary | ICD-10-CM

## 2019-06-11 NOTE — Progress Notes (Signed)
OB presents for TOC of +CT, +Trich, BV.  She completed treatment course and stated that she is no longer with that partner.   Self swab done. Cultures sent to Lab.

## 2019-06-11 NOTE — Progress Notes (Signed)
Patient noted at last visit she thinks she had a blood clot in brain as baby, asked to bring information. States she never had surgery, has never had ongoing issues. Dropped off information sheet for hydrocephalus, need to review with patient.

## 2019-06-12 LAB — CERVICOVAGINAL ANCILLARY ONLY
Bacterial Vaginitis (gardnerella): NEGATIVE
Chlamydia: NEGATIVE
Comment: NEGATIVE
Comment: NEGATIVE
Comment: NEGATIVE
Comment: NORMAL
Neisseria Gonorrhea: NEGATIVE
Trichomonas: NEGATIVE

## 2019-06-23 ENCOUNTER — Other Ambulatory Visit: Payer: Self-pay | Admitting: Obstetrics and Gynecology

## 2019-06-23 MED ORDER — BUTALBITAL-APAP-CAFFEINE 50-325-40 MG PO CAPS: 1.0000 | ORAL_CAPSULE | Freq: Four times a day (QID) | ORAL | 3 refills | Status: DC | PRN

## 2019-06-25 ENCOUNTER — Telehealth (INDEPENDENT_AMBULATORY_CARE_PROVIDER_SITE_OTHER): Payer: 59 | Admitting: Obstetrics & Gynecology

## 2019-06-25 DIAGNOSIS — O361992 Maternal care for other isoimmunization, unspecified trimester, fetus 2: Secondary | ICD-10-CM

## 2019-06-25 DIAGNOSIS — O99212 Obesity complicating pregnancy, second trimester: Secondary | ICD-10-CM

## 2019-06-25 DIAGNOSIS — D563 Thalassemia minor: Secondary | ICD-10-CM

## 2019-06-25 DIAGNOSIS — A5901 Trichomonal vulvovaginitis: Secondary | ICD-10-CM

## 2019-06-25 DIAGNOSIS — O361990 Maternal care for other isoimmunization, unspecified trimester, not applicable or unspecified: Secondary | ICD-10-CM

## 2019-06-25 DIAGNOSIS — O98812 Other maternal infectious and parasitic diseases complicating pregnancy, second trimester: Secondary | ICD-10-CM

## 2019-06-25 DIAGNOSIS — A749 Chlamydial infection, unspecified: Secondary | ICD-10-CM

## 2019-06-25 DIAGNOSIS — O99210 Obesity complicating pregnancy, unspecified trimester: Secondary | ICD-10-CM

## 2019-06-25 NOTE — Patient Instructions (Signed)

## 2019-06-25 NOTE — Progress Notes (Signed)
   TELEHEALTH VIRTUAL OBSTETRICS VISIT ENCOUNTER NOTE  I connected with Martha Hall on 06/25/19 at  9:15 AM EST by telephone at home and verified that I am speaking with the correct person using two identifiers.   I discussed the limitations, risks, security and privacy concerns of performing an evaluation and management service by telephone and the availability of in person appointments. I also discussed with the patient that there may be a patient responsible charge related to this service. The patient expressed understanding and agreed to proceed.  Subjective:  Martha Hall is a 24 y.o. G1P0000 at [redacted]w[redacted]d being followed for ongoing prenatal care.  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  Patient reports nausea. Reports fetal movement. Denies any contractions, bleeding or leaking of fluid.   The following portions of the patient's history were reviewed and updated as appropriate: allergies, current medications, past family history, past medical history, past social history, past surgical history and problem list.   Objective:   General:  Alert, oriented and cooperative.   Mental Status: Normal mood and affect perceived. Normal judgment and thought content.  Rest of physical exam deferred due to type of encounter  Assessment and Plan:  Pregnancy: G1P0000 at [redacted]w[redacted]d 1. Alpha thalassemia silent carrier Dx on [place]  2. Lewis isoimmunization during pregnancy, antepartum, single or unspecified fetus n  3. Vaginal trichomoniasis treated  4. Chlamydia infection affecting pregnancy, antepartum TOC negative  5. Obesity in pregnancy Had nl  A1C  Preterm labor symptoms and general obstetric precautions including but not limited to vaginal bleeding, contractions, leaking of fluid and fetal movement were reviewed in detail with the patient.  I discussed the assessment and treatment plan with the patient. The patient was provided an opportunity to ask questions and all were answered. The patient agreed with the plan and demonstrated an understanding of the instructions. The patient was advised to call back or seek an in-person office evaluation/go to [facility]U at [facility] for any urgent or concerning symptoms. Please refer to After Visit Summary for other counseling recommendations.   I provided 11 minutes of non-face-to-face time during this encounter.  Return in about 3 weeks (around 07/16/2019) for in person AFP.  Future Appointments  Date Time Provider Department Center  06/30/2019  9:30 AM WH-MFC [place] 1 WH-MFCUS MFC-US    Scheryl Darter, MD Center for [facility], [facility]

## 2019-06-25 NOTE — Progress Notes (Signed)
Pt has not yet been able to get BP cuff.

## 2019-06-30 ENCOUNTER — Other Ambulatory Visit (HOSPITAL_COMMUNITY): Payer: Self-pay | Admitting: *Deleted

## 2019-06-30 ENCOUNTER — Other Ambulatory Visit: Payer: Self-pay | Admitting: Women's Health

## 2019-06-30 ENCOUNTER — Other Ambulatory Visit: Payer: Self-pay

## 2019-06-30 ENCOUNTER — Ambulatory Visit (HOSPITAL_COMMUNITY)
Admission: RE | Admit: 2019-06-30 | Discharge: 2019-06-30 | Disposition: A | Discharge status: Home / Self Care | Payer: 59 | Source: Ambulatory Visit | Attending: Women's Health | Admitting: Women's Health

## 2019-06-30 DIAGNOSIS — Z3401 Encounter for supervision of normal first pregnancy, first trimester: Secondary | ICD-10-CM

## 2019-06-30 DIAGNOSIS — Z3A19 19 weeks gestation of pregnancy: Secondary | ICD-10-CM | POA: Diagnosis not present

## 2019-06-30 DIAGNOSIS — O361920 Maternal care for other isoimmunization, second trimester, not applicable or unspecified: Secondary | ICD-10-CM | POA: Diagnosis not present

## 2019-06-30 DIAGNOSIS — Z362 Encounter for other antenatal screening follow-up: Secondary | ICD-10-CM

## 2019-06-30 IMAGING — US US MFM OB DETAIL+14 WK
1 series · 13 of 28 positions shown · non-contrast
Comparison: none

[Series 1: us mfm ob detail+14 wk · 93 acquisitions, 13 frames shown]
[im 4/93]
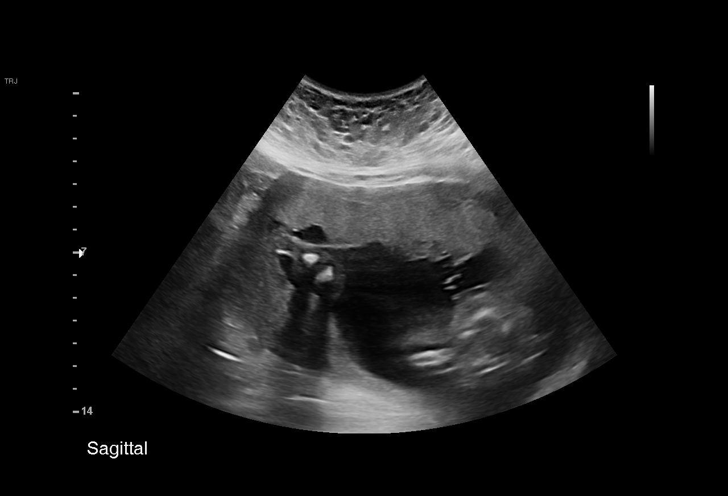
[im 11/93]
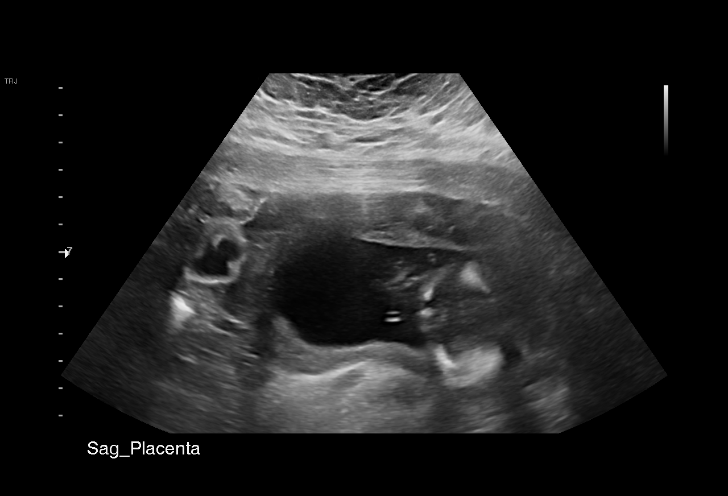
[im 18/93]
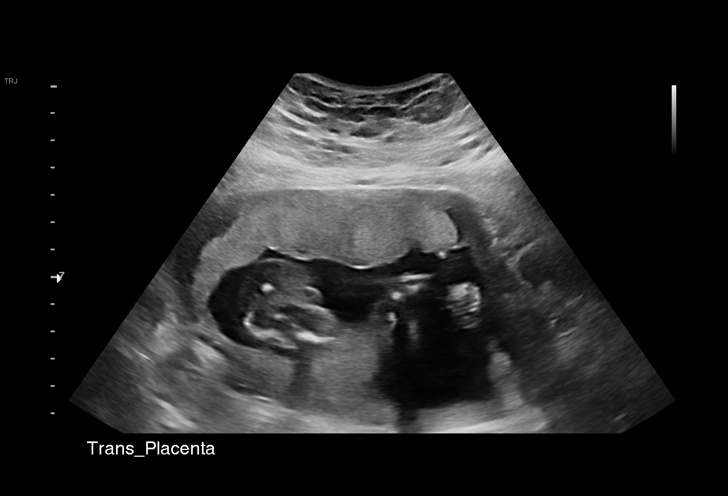
[im 24/93]
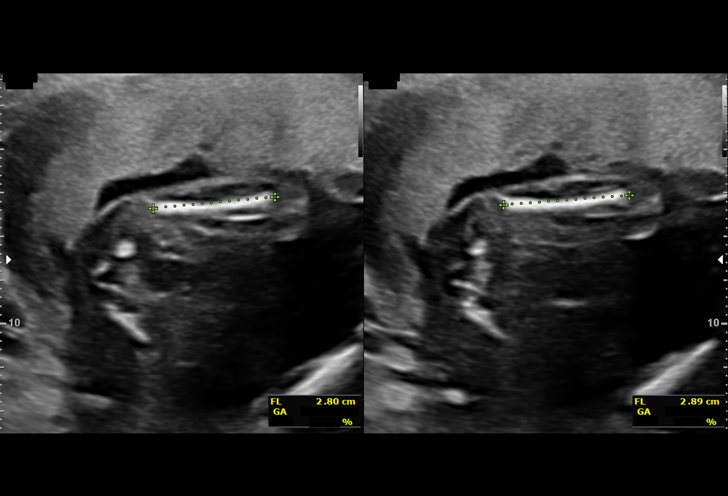
[im 31/93]
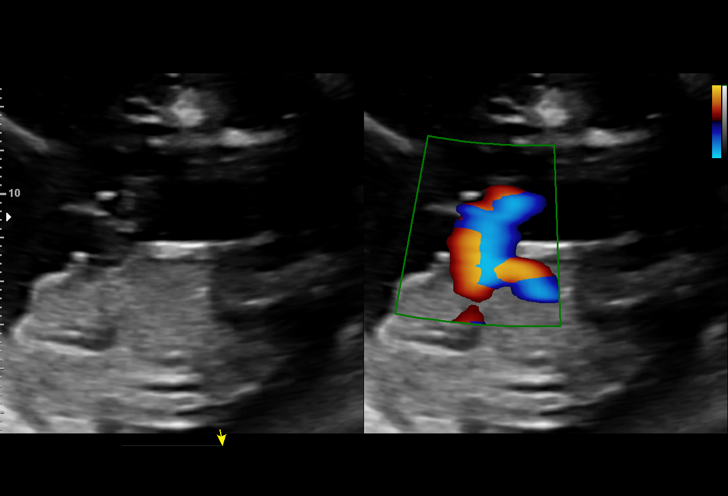
[im 38/93]
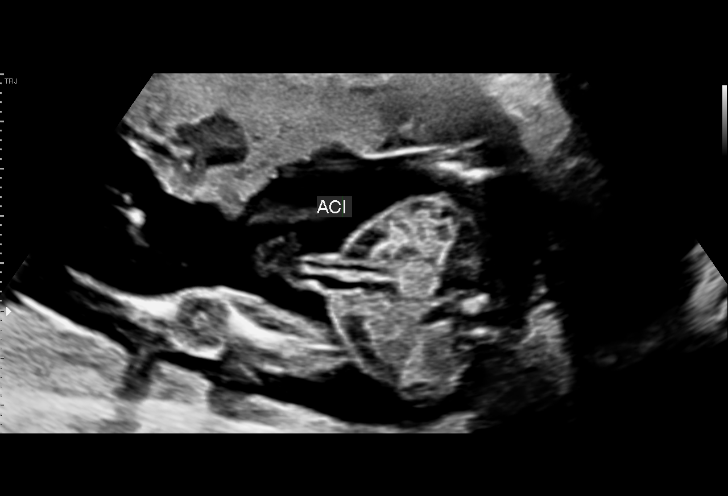
[im 48/93]
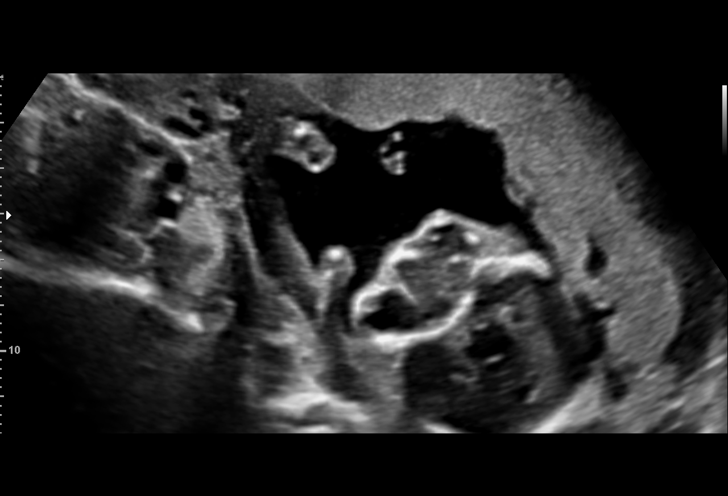
[im 55/93]
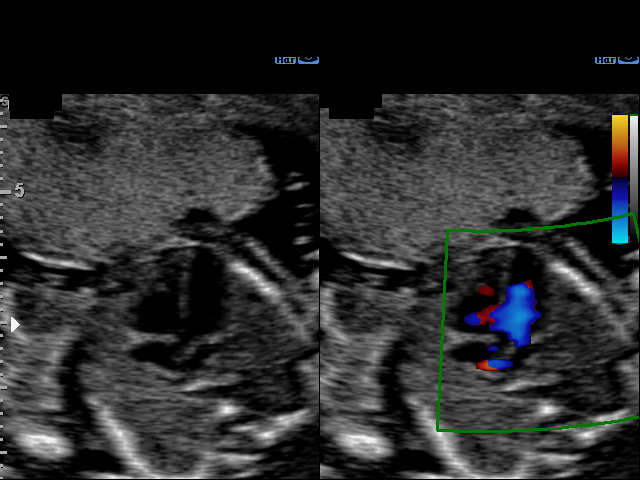
[im 62/93]
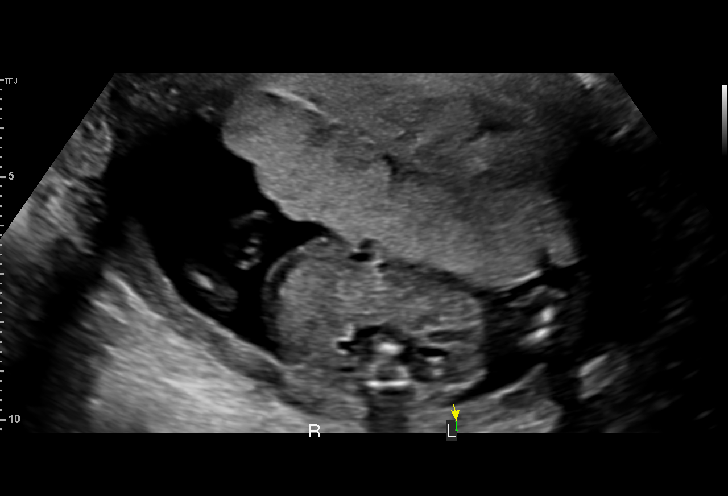
[im 69/93]
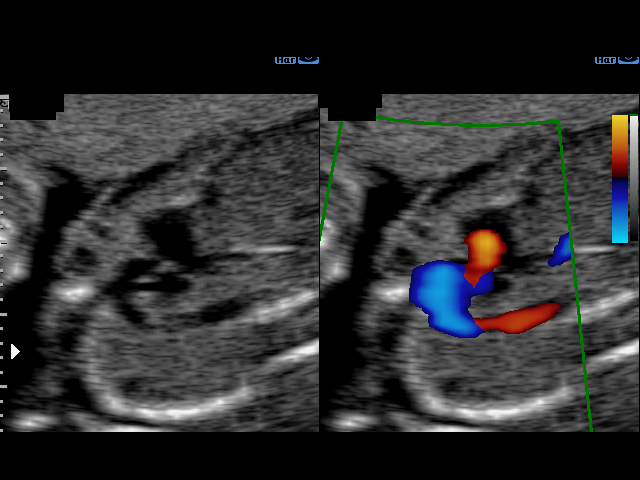
[im 75/93]
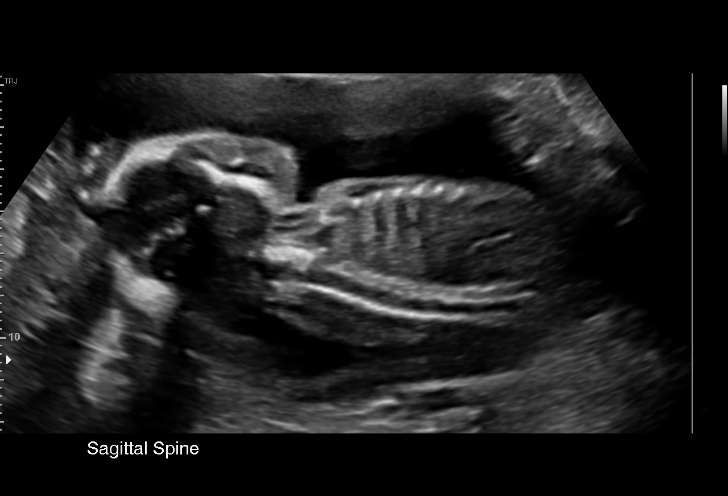
[im 82/93]
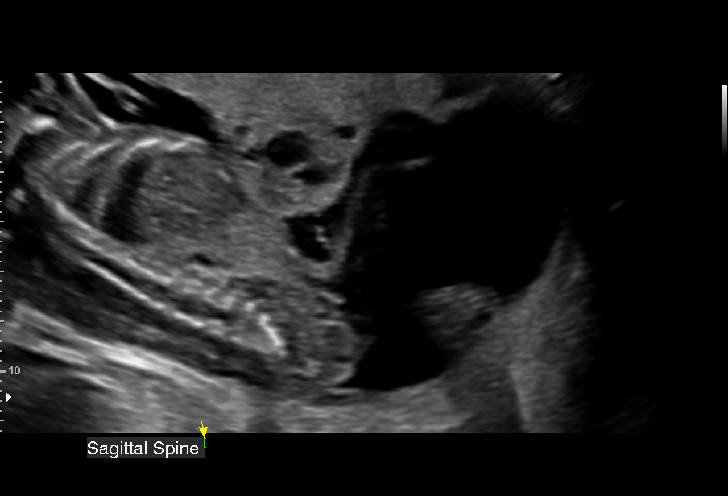
[im 89/93]
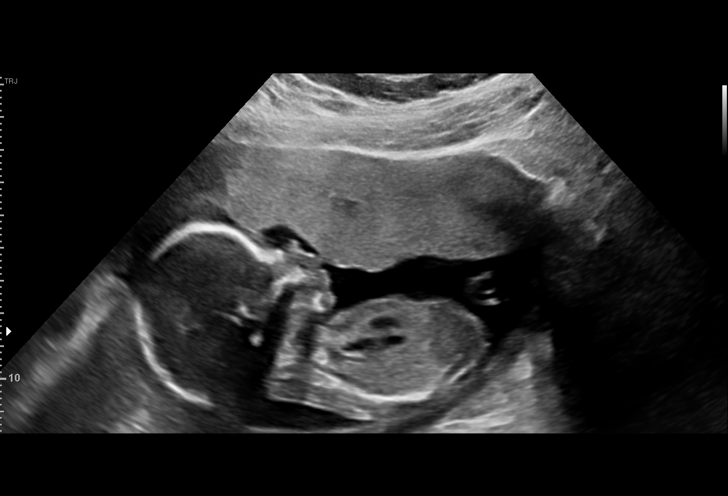

[13 of 28 positions shown; findings below may reference images not displayed]

----------------------------------------------------------------------

 ----------------------------------------------------------------------
Indications

  Isoimmunization - Rh                           [M9]
  Encounter for antenatal screening for          [M9]
  malformations
  Silent JEANERMANN
  Obesity complicating pregnancy, second         [M9]
  trimester
  19 weeks gestation of pregnancy
 ----------------------------------------------------------------------
Fetal Evaluation

 Num Of Fetuses:         1
 Fetal Heart Rate(bpm):  168
 Cardiac Activity:       Observed
 Presentation:           Variable
 Placenta:               Anterior
 P. Cord Insertion:      Visualized, central

 Amniotic Fluid
 AFI FV:      Within normal limits

                             Largest Pocket(cm)

Biometry

 BPD:      42.9  mm     G. Age:  19w 0d         43  %    CI:        67.93   %    70 - 86
                                                         FL/HC:      17.2   %    16.1 -
 HC:      166.5  mm     G. Age:  19w 2d         52  %    HC/AC:      1.20        1.09 -
 AC:      138.7  mm     G. Age:  19w 2d         50  %    FL/BPD:     66.9   %
 FL:       28.7  mm     G. Age:  18w 6d         31  %    FL/AC:      20.7   %    20 - 24
 HUM:      29.9  mm     G. Age:  19w 6d         68  %
 CER:      19.6  mm     G. Age:  18w 6d         41  %
 NFT:       3.2  mm
 LV:        5.9  mm
 CM:        2.5  mm

 Est. FW:     273  gm    0 lb 10 oz      42  %
OB History

 Gravidity:    1
Gestational Age

 LMP:           19w 1d        Date:  [DATE]                 EDD:   [DATE]
 U/S Today:     19w 1d                                        EDD:   [DATE]
 Best:          19w 1d     Det. By:  LMP  ([DATE])          EDD:   [DATE]
Anatomy

 Cranium:               Appears normal         Aortic Arch:            Appears normal
 Cavum:                 Appears normal         Ductal Arch:            Appears normal
 Ventricles:            Appears normal         Diaphragm:              Appears normal
 Choroid Plexus:        Appears normal         Stomach:                Appears normal, left
                                                                       sided
 Cerebellum:            Appears normal         Abdomen:                Appears normal
 Posterior Fossa:       Appears normal         Abdominal Wall:         Appears nml (cord
                                                                       insert, abd wall)
 Nuchal Fold:           Appears normal         Cord Vessels:           Appears normal (3
                                                                       vessel cord)
 Face:                  Appears normal         Kidneys:                Appear normal
                        (orbits and profile)
 Lips:                  Appears normal         Bladder:                Appears normal
 Palate:                Appears normal         Spine:                  Ltd views no
                                                                       intracranial signs of
                                                                       NTD
 Heart:                 Appears normal         Upper Extremities:      Appears normal
                        (4CH, axis, and
                        situs)
 RVOT:                  Appears normal         Lower Extremities:      Appears normal
 LVOT:                  Appears normal

 Other:  Heels visualized. Fetus appears to be a male.
Cervix Uterus Adnexa

 Cervix
 Length:           5.25  cm.
 Normal appearance by transabdominal scan.

 Left Ovary
 Within normal limits.

 Right Ovary
 Within normal limits.
Comments

 This patient was seen for a detailed fetal anatomy scan as
 her type and screen showed the presence of JEANERMANN
 antibodies.
 She denies any significant past medical history and denies
 any problems in her current pregnancy.
 She had a cell free DNA test earlier in her pregnancy which
 indicated a low risk for trisomy 21, 18, and 13. A male fetus is
 predicted.
 She was informed that the fetal growth and amniotic fluid
 level were appropriate for her gestational age.
 There were no obvious fetal anomalies noted on today's
 ultrasound exam.  However, certain views of the fetal
 anatomy could not be fully visualized today due to the fetal
 position.
 The patient was informed that anomalies may be missed due
 to technical limitations. If the fetus is in a suboptimal position
 or maternal habitus is increased, visualization of the fetus in
 the maternal uterus may be impaired.
 The patient was reassured that the JEANERMANN antibodies are
 usually of the IgM subtype and therefore we will not cross the
 placenta and cause fetal anemia.  The JEANERMANN antibodies
 should therefore not affect the management of her current
 pregnancy.
 A follow-up exam was scheduled in 4 weeks to complete the
 views of the fetal anatomy.

## 2019-06-30 NOTE — Progress Notes (Signed)
Patient seen and assessed by nursing staff during this encounter. I have reviewed the chart and agree with the documentation and plan.  Catalina Antigua, MD 06/30/2019 8:48 AM

## 2019-07-15 ENCOUNTER — Other Ambulatory Visit: Payer: Self-pay

## 2019-07-15 MED ORDER — DOXYLAMINE-PYRIDOXINE 10-10 MG PO TBEC: DELAYED_RELEASE_TABLET | ORAL | 0 refills | Status: DC

## 2019-07-16 ENCOUNTER — Other Ambulatory Visit: Payer: Self-pay

## 2019-07-16 ENCOUNTER — Ambulatory Visit (INDEPENDENT_AMBULATORY_CARE_PROVIDER_SITE_OTHER): Payer: 59 | Admitting: Family Medicine

## 2019-07-16 VITALS — BP 135/77 | HR 97 | Wt 232.0 lb

## 2019-07-16 DIAGNOSIS — D563 Thalassemia minor: Secondary | ICD-10-CM

## 2019-07-16 DIAGNOSIS — Z3201 Encounter for pregnancy test, result positive: Secondary | ICD-10-CM

## 2019-07-16 DIAGNOSIS — O361992 Maternal care for other isoimmunization, unspecified trimester, fetus 2: Secondary | ICD-10-CM

## 2019-07-16 DIAGNOSIS — O361990 Maternal care for other isoimmunization, unspecified trimester, not applicable or unspecified: Secondary | ICD-10-CM

## 2019-07-16 DIAGNOSIS — O98812 Other maternal infectious and parasitic diseases complicating pregnancy, second trimester: Secondary | ICD-10-CM

## 2019-07-16 DIAGNOSIS — O99212 Obesity complicating pregnancy, second trimester: Secondary | ICD-10-CM

## 2019-07-16 DIAGNOSIS — Z3480 Encounter for supervision of other normal pregnancy, unspecified trimester: Secondary | ICD-10-CM

## 2019-07-16 DIAGNOSIS — A5901 Trichomonal vulvovaginitis: Secondary | ICD-10-CM

## 2019-07-16 DIAGNOSIS — Z3A21 21 weeks gestation of pregnancy: Secondary | ICD-10-CM

## 2019-07-16 DIAGNOSIS — O99210 Obesity complicating pregnancy, unspecified trimester: Secondary | ICD-10-CM

## 2019-07-16 DIAGNOSIS — A749 Chlamydial infection, unspecified: Secondary | ICD-10-CM

## 2019-07-16 DIAGNOSIS — Z8669 Personal history of other diseases of the nervous system and sense organs: Secondary | ICD-10-CM

## 2019-07-16 MED ORDER — PRENATAL VITAMIN 27-0.8 MG PO TABS: 1.0000 | ORAL_TABLET | Freq: Every day | ORAL | 11 refills | Status: DC

## 2019-07-16 NOTE — Progress Notes (Signed)
ROB/AFP 

## 2019-07-16 NOTE — Progress Notes (Signed)
   PRENATAL VISIT NOTE  Subjective:  Martha Hall is a 24 y.o. G1P0000 at [redacted]w[redacted]d being seen today for ongoing prenatal care.  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  Patient reports no complaints.  Contractions: Not present. Vag. Bleeding: None.  Movement: Present. Denies leaking of fluid.   The following portions of the patient's history were reviewed and updated as appropriate: allergies, current medications, past family history, past medical history, past social history, past surgical history and problem list.   Objective:   Vitals:   07/16/19 0958  BP: 135/77  Pulse: 97  Weight: 232 lb (105.2 kg)    Fetal Status: Fetal Heart Rate (bpm): 147   Movement: Present     General:  Alert, oriented and cooperative. Patient is in no acute distress.  Skin: Skin is warm and dry. No rash noted.   Cardiovascular: Normal heart rate noted  Respiratory: Normal respiratory effort, no problems with respiration noted  Abdomen: Soft, gravid, appropriate for gestational age.  Pain/Pressure: Absent     Pelvic: Cervical exam deferred        Extremities: Normal range of motion.  Edema: None  Mental Status: Normal mood and affect. Normal behavior. Normal judgment and thought content.   Assessment and Plan:  Pregnancy: G1P0000 at [redacted]w[redacted]d Martha Hall was seen today for routine prenatal visit.  Diagnoses and all orders for this visit:  Supervision of other normal pregnancy, antepartum - RTC in 4 weeks - Next [place] 3/22   Alpha thalassemia silent carrier - seen on [facility] screening  Obesity in pregnancy - limit weight gain in pregnancy - A1c 5.6 on 04/30/2019 - cont  Aspirin  Wt Readings from Last 3 Encounters:  07/16/19 232 lb (105.2 kg)  06/11/19 229 lb (103.9 kg)  04/30/19 230 lb (104.3 kg)   Chlamydia infection affecting pregnancy, antepartum Vaginal trichomoniasis - negative TOC 2/3  History of hydrocephalus - Ambulatory referral to Neurology - Patient has not seen a neurologist since she was a child but 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  Preterm labor symptoms and general obstetric precautions including but not limited to vaginal bleeding, contractions, leaking of fluid and fetal movement were reviewed in detail with the patient. Please refer to After Visit Summary for other counseling recommendations.   Return in about 4 weeks (around 08/13/2019) for LOB; in-person per patient request.  Future Appointments  Date Time Provider Department Center  07/28/2019  8:45 AM [facility] NURSE [facility]  07/28/2019  8:45 AM [facility] [place] 2 [facility]  08/13/2019  3:30 PM Constant, Gigi Gin, MD [facility] None    Joselyn Arrow, MD

## 2019-07-28 ENCOUNTER — Other Ambulatory Visit: Payer: Self-pay

## 2019-07-28 ENCOUNTER — Ambulatory Visit (HOSPITAL_COMMUNITY): Payer: 59

## 2019-07-28 ENCOUNTER — Ambulatory Visit (HOSPITAL_COMMUNITY)
Admission: RE | Admit: 2019-07-28 | Discharge: 2019-07-28 | Disposition: A | Discharge status: Home / Self Care | Payer: 59 | Source: Ambulatory Visit | Attending: Obstetrics and Gynecology | Admitting: Obstetrics and Gynecology

## 2019-07-28 ENCOUNTER — Other Ambulatory Visit (HOSPITAL_COMMUNITY): Payer: Self-pay | Admitting: *Deleted

## 2019-07-28 DIAGNOSIS — Z3A23 23 weeks gestation of pregnancy: Secondary | ICD-10-CM

## 2019-07-28 DIAGNOSIS — Z362 Encounter for other antenatal screening follow-up: Secondary | ICD-10-CM | POA: Diagnosis not present

## 2019-07-28 DIAGNOSIS — O99210 Obesity complicating pregnancy, unspecified trimester: Secondary | ICD-10-CM

## 2019-07-28 DIAGNOSIS — O361990 Maternal care for other isoimmunization, unspecified trimester, not applicable or unspecified: Secondary | ICD-10-CM | POA: Diagnosis not present

## 2019-07-28 DIAGNOSIS — O99212 Obesity complicating pregnancy, second trimester: Secondary | ICD-10-CM | POA: Diagnosis not present

## 2019-07-28 IMAGING — US US MFM OB FOLLOW-UP
1 series · 14 of 28 positions shown · non-contrast
Comparison: none

[Series 1: us mfm ob follow-up · 14 of 62 slices shown]
[im 3/62]
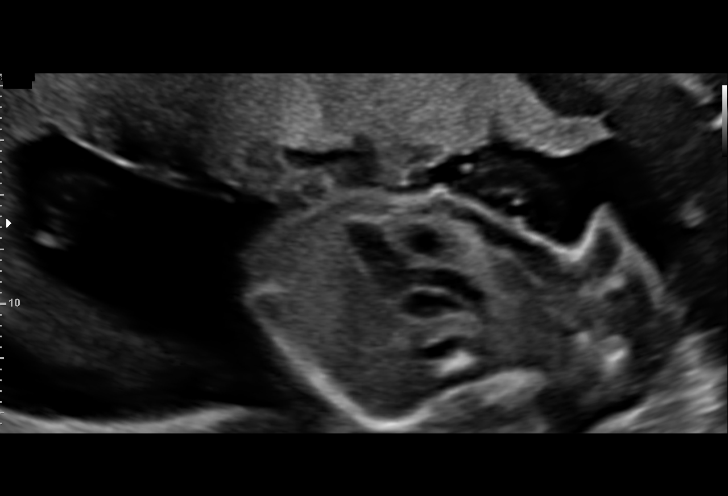
[im 7/62]
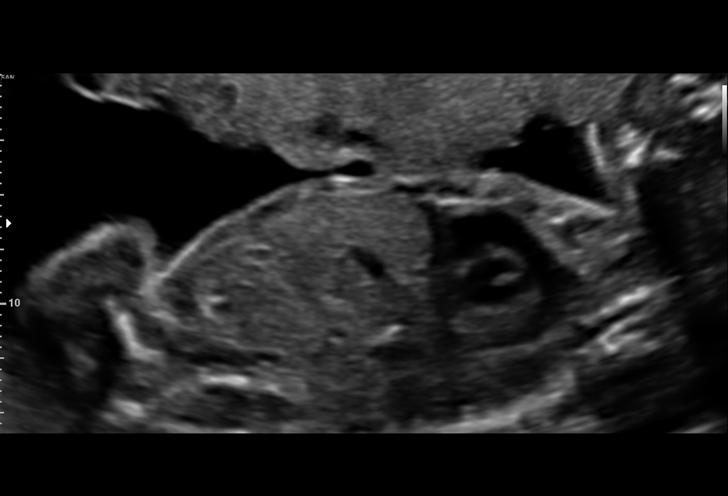
[im 12/62]
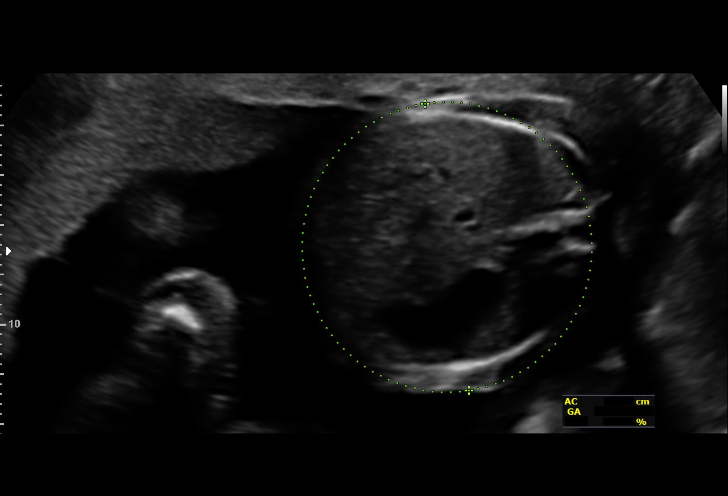
[im 16/62]
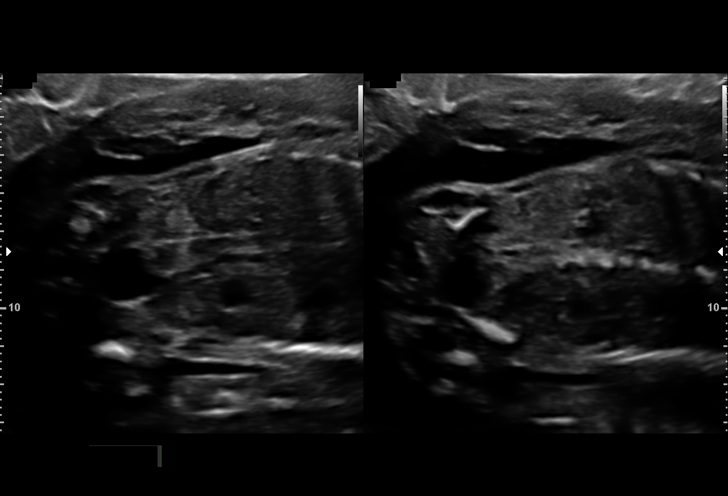
[im 21/62]
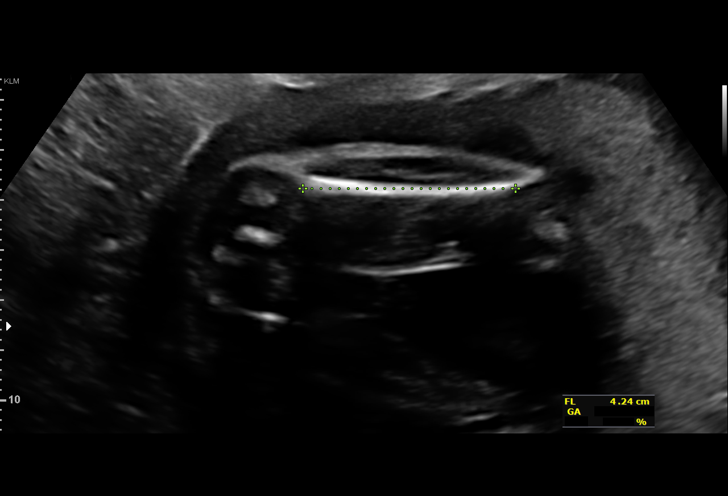
[im 25/62]
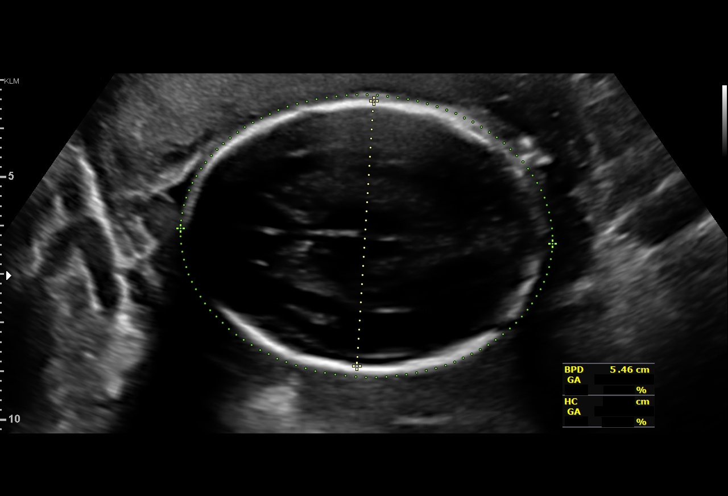
[im 30/62]
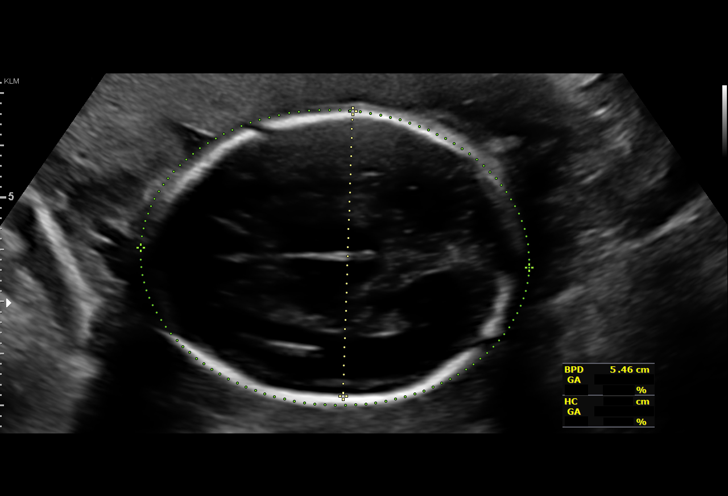
[im 34/62]
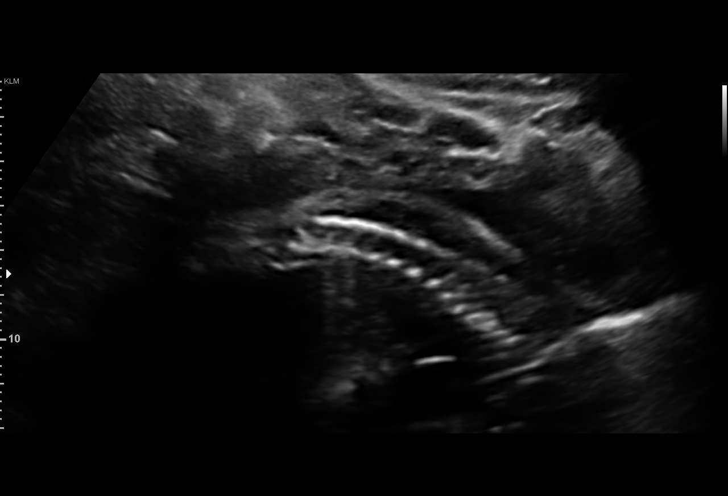
[im 39/62]
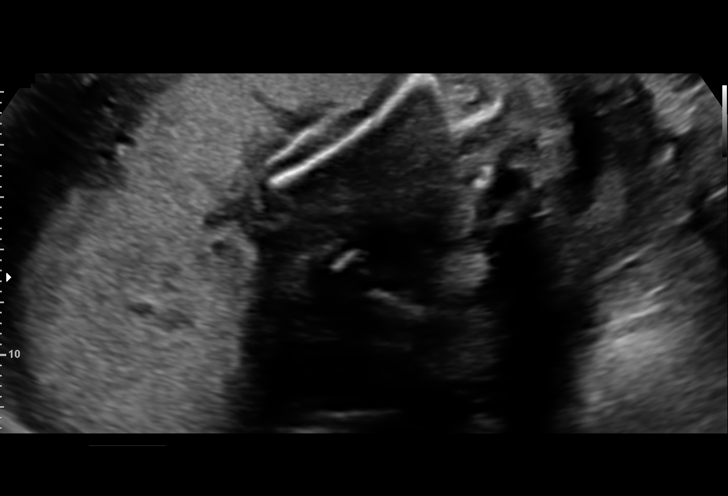
[im 43/62]
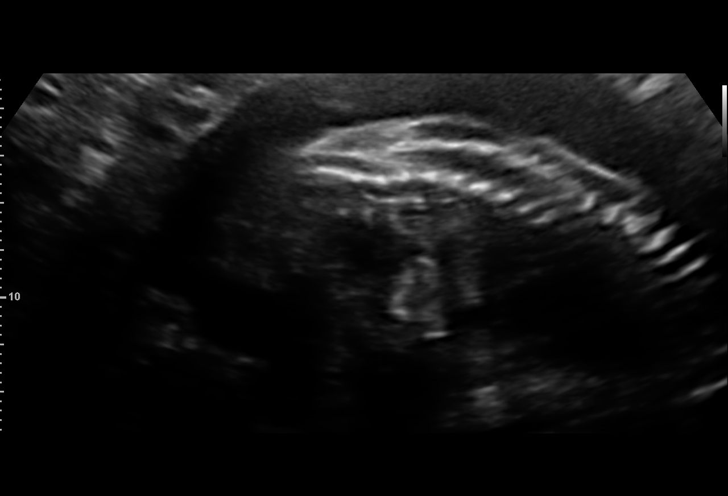
[im 48/62]
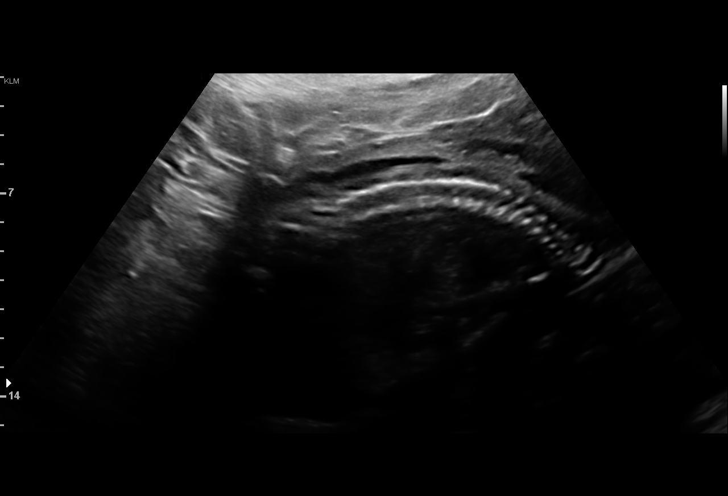
[im 52/62]
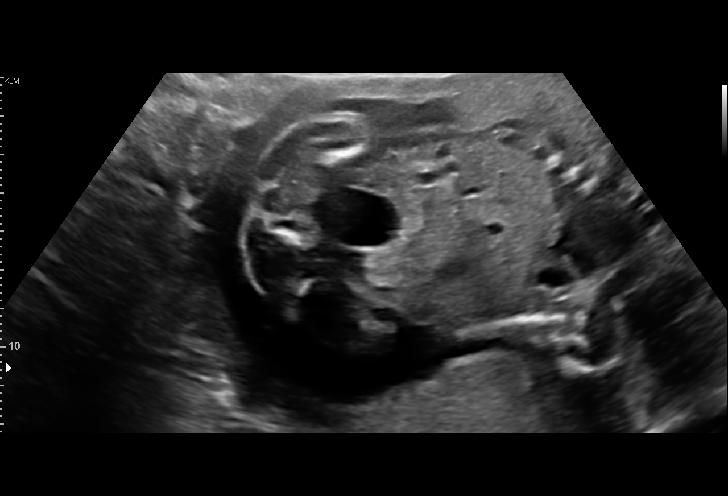
[im 57/62]
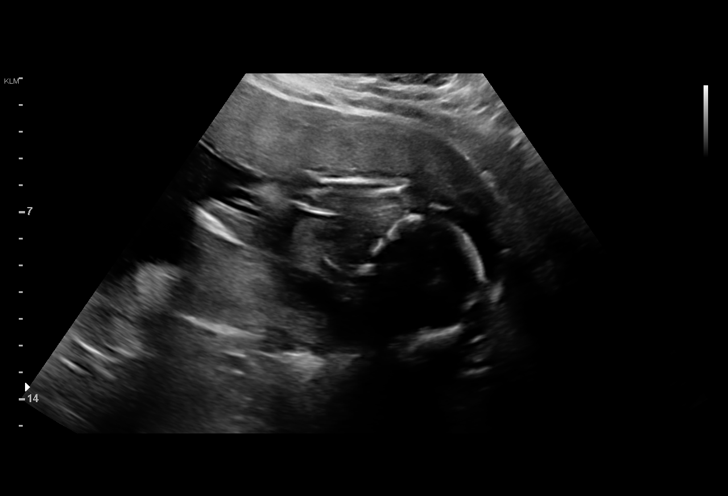
[im 62/62]
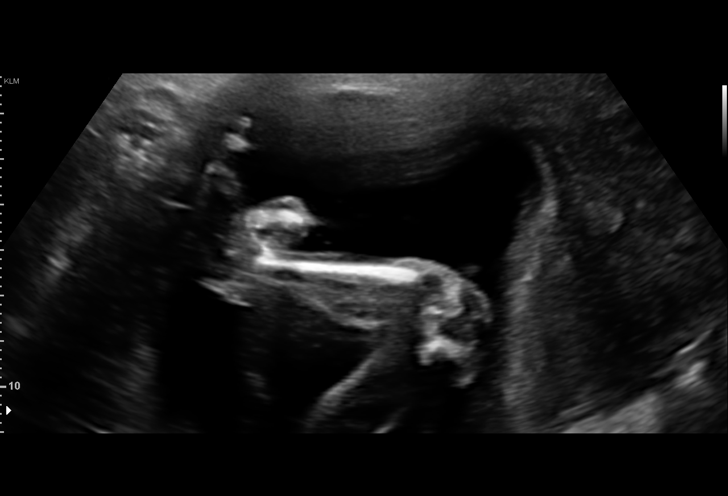

[14 of 28 positions shown; findings below may reference images not displayed]

----------------------------------------------------------------------

 ----------------------------------------------------------------------
Indications

  23 weeks gestation of pregnancy
  Isoimmunization - Rh                           [29]
  Silent RENDON
  Obesity complicating pregnancy, second         [29]
  trimester
  Antenatal follow-up for nonvisualized fetal    [29]
  anatomy
 ----------------------------------------------------------------------
Fetal Evaluation

 Num Of Fetuses:         1
 Fetal Heart Rate(bpm):  140
 Cardiac Activity:       Observed
 Presentation:           Cephalic
 Placenta:               Anterior
 P. Cord Insertion:      Previously Visualized

 Amniotic Fluid
 AFI FV:      Within normal limits

                             Largest Pocket(cm)

Biometry

 BPD:      54.7  mm     G. Age:  22w 5d         27  %    CI:        68.47   %    70 - 86
                                                         FL/HC:      20.6   %    19.2 -
 HC:      211.3  mm     G. Age:  23w 1d         37  %    HC/AC:      1.14        1.05 -
 AC:      185.4  mm     G. Age:  23w 2d         48  %    FL/BPD:     79.5   %    71 - 87
 FL:       43.5  mm     G. Age:  24w 2d         75  %    FL/AC:      23.5   %    20 - 24

 Est. FW:     614  gm      1 lb 6 oz     67  %
OB History

 Gravidity:    1
Gestational Age

 LMP:           23w 1d        Date:  [DATE]                 EDD:   [DATE]
 U/S Today:     23w 3d                                        EDD:   [DATE]
 Best:          23w 1d     Det. By:  LMP  ([DATE])          EDD:   [DATE]
Anatomy

 Cranium:               Appears normal         LVOT:                   Appears normal
 Cavum:                 Appears normal         Aortic Arch:            Appears normal
 Ventricles:            Appears normal         Ductal Arch:            Appears normal
 Choroid Plexus:        Appears normal         Diaphragm:              Appears normal
 Cerebellum:            Appears normal         Stomach:                Appears normal, left
                                                                       sided
 Posterior Fossa:       Appears normal         Abdomen:                Appears normal
 Nuchal Fold:           Previously seen        Abdominal Wall:         Appears nml (cord
                                                                       insert, abd wall)
 Face:                  Orbits and profile     Cord Vessels:           Previously seen
                        previously seen
 Lips:                  Previously seen        Kidneys:                Appear normal
 Palate:                Previously seen        Bladder:                Appears normal
 Thoracic:              Appears normal         Spine:                  Appears normal
 Heart:                 Appears normal         Upper Extremities:      Previously seen
                        (4CH, axis, and
                        situs)
 RVOT:                  Appears normal         Lower Extremities:      Previously seen

 Other:  Heels previously visualized. Fetus appears to be a male.
Cervix Uterus Adnexa

 Cervix
 Length:            3.2  cm.
 Normal appearance by transabdominal scan.
Impression

 Patient returned for fetal anatomical survey. Amniotic fluid is
 normal and good fetal activity is seen. Fetal biometry is
 consistent with her previously-established dates. Fetal spine
 appears normal.
Recommendations

 -An appointment was made for her to return in 9 weeks for
 fetal growth assessment.
                 RENDON

## 2019-08-13 ENCOUNTER — Encounter: Payer: Self-pay | Admitting: Obstetrics and Gynecology

## 2019-08-13 ENCOUNTER — Telehealth (INDEPENDENT_AMBULATORY_CARE_PROVIDER_SITE_OTHER): Payer: 59 | Admitting: Obstetrics and Gynecology

## 2019-08-13 ENCOUNTER — Other Ambulatory Visit: Payer: Self-pay | Admitting: *Deleted

## 2019-08-13 VITALS — BP 131/72 | HR 102

## 2019-08-13 DIAGNOSIS — Z8669 Personal history of other diseases of the nervous system and sense organs: Secondary | ICD-10-CM

## 2019-08-13 DIAGNOSIS — E669 Obesity, unspecified: Secondary | ICD-10-CM

## 2019-08-13 DIAGNOSIS — Z3480 Encounter for supervision of other normal pregnancy, unspecified trimester: Secondary | ICD-10-CM

## 2019-08-13 DIAGNOSIS — O99212 Obesity complicating pregnancy, second trimester: Secondary | ICD-10-CM

## 2019-08-13 DIAGNOSIS — Z3A25 25 weeks gestation of pregnancy: Secondary | ICD-10-CM

## 2019-08-13 DIAGNOSIS — O99210 Obesity complicating pregnancy, unspecified trimester: Secondary | ICD-10-CM

## 2019-08-13 MED ORDER — COMFORT FIT MATERNITY SUPP LG MISC: 0 refills | Status: DC

## 2019-08-13 MED ORDER — ASPIRIN EC 81 MG PO TBEC: 81.0000 mg | DELAYED_RELEASE_TABLET | Freq: Every day | ORAL | 3 refills | Status: DC

## 2019-08-13 NOTE — Progress Notes (Signed)
I connected with  Martha Hall on 08/13/19 by a video enabled telemedicine application and verified that I am speaking with the correct person using two identifiers.   I discussed the limitations of evaluation and management by telemedicine. The patient expressed understanding and agreed to proceed.  Mychart ROB c/o hip pain 3/10 x 2 weeks.

## 2019-08-13 NOTE — Progress Notes (Signed)
Pt sent message to staff stating she does not have refills at pharmacy on her low dose aspirin.  Refills have been sent today.

## 2019-08-13 NOTE — Progress Notes (Signed)
OBSTETRICS PRENATAL VIRTUAL VISIT ENCOUNTER NOTE  Provider location: [facility]   I connected with [name] on 08/13/19 at  3:30 PM EDT by MyChart Video Encounter at home and verified that I am speaking with the correct person using two identifiers.   I discussed the limitations, risks, security and privacy concerns of performing an evaluation and management service virtually and the availability of in person appointments. I also discussed with the patient that there may be a patient responsible charge related to this service. The patient expressed understanding and agreed to proceed. Subjective:  Martha Hall is a 24 y.o. G1P0000 at [redacted]w[redacted]d being seen today for ongoing prenatal care.  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  Patient reports hip pain.  Contractions: Not present. Vag. Bleeding: None.  Movement: Present. Denies any leaking of fluid.   The following portions of the patient's history were reviewed and updated as appropriate: allergies, current medications, past family history, past medical history, past social history, past surgical history and problem list.   Objective:   Vitals:   08/13/19 1349  BP: 131/72  Pulse: (!) 102    Fetal Status:     Movement: Present     General:  Alert, oriented and cooperative. Patient is in no acute distress.  Respiratory: Normal respiratory effort, no problems with respiration noted  Mental Status: Normal mood and affect. Normal behavior. Normal judgment and thought content.  Rest of physical exam deferred due to type of encounter  Imaging: [place] MFM OB FOLLOW  UP  Result Date: 07/28/2019 ----------------------------------------------------------------------  OBSTETRICS REPORT                       (Signed Final 07/28/2019 01:17 pm) ---------------------------------------------------------------------- Patient Info  ID #:       [id number]                          D.O.B.:  12-29-1995 (23 yrs)  Name:       Martha Hall                 Visit Date: 07/28/2019 09:24 am ---------------------------------------------------------------------- Performed By  Performed By:     Marcellina Millin          Ref. Address:     [address]                                                             [phone]  [place]                                                             [place]  Attending:        Tama High MD        [facility]  Referred By:      [facility] ---------------------------------------------------------------------- Orders   #  Description                          Code         Ordered By   1  [place] MFM OB FOLLOW UP                  [id number]     YU FANG  ----------------------------------------------------------------------   #  Order #                    Accession #                 Episode #   1  [id number]                  [id number]                  [id number]  ---------------------------------------------------------------------- Indications   [redacted] weeks gestation of pregnancy                Z3A.23   Isoimmunization - Rh                           O36.1990   Silent carrier alpha-thal   Obesity complicating pregnancy, second         O99.212   trimester   Antenatal follow-up for nonvisualized fetal    Z36.2   anatomy  ---------------------------------------------------------------------- Fetal Evaluation  Num Of Fetuses:         1   Fetal Heart Rate(bpm):  140  Cardiac Activity:       Observed  Presentation:           Cephalic  Placenta:               Anterior  P. Cord Insertion:      Previously Visualized  Amniotic Fluid  AFI FV:      Within normal limits                              Largest Pocket(cm)                              5.8 ---------------------------------------------------------------------- Biometry  BPD:      54.7  mm     G. Age:  22w 5d  27  %    CI:        68.47   %    70 - 86                                                          FL/HC:      20.6   %    19.2 - 20.8  HC:      211.3  mm     G. Age:  23w 1d         37  %    HC/AC:      1.14        1.05 - 1.21  AC:      185.4  mm     G. Age:  23w 2d         48  %    FL/BPD:     79.5   %    71 - 87  FL:       43.5  mm     G. Age:  24w 2d         75  %    FL/AC:      23.5   %    20 - 24  Est. FW:     614  gm      1 lb 6 oz     67  % ---------------------------------------------------------------------- OB History  Gravidity:    1 ---------------------------------------------------------------------- Gestational Age  LMP:           23w 1d        Date:  02/16/19                 EDD:   11/23/19  U/S Today:     23w 3d                                        EDD:   11/21/19  Best:          23w 1d     Det. By:  LMP  (02/16/19)          EDD:   11/23/19 ---------------------------------------------------------------------- Anatomy  Cranium:               Appears normal         LVOT:                   Appears normal  Cavum:                 Appears normal         Aortic Arch:            Appears normal  Ventricles:            Appears normal         Ductal Arch:            Appears normal  Choroid Plexus:        Appears normal         Diaphragm:              Appears normal  Cerebellum:            Appears normal  Stomach:                Appears normal, left                                                                        sided  Posterior Fossa:       Appears normal          Abdomen:                Appears normal  Nuchal Fold:           Previously seen        Abdominal Wall:         Appears nml (cord                                                                        insert, abd wall)  Face:                  Orbits and profile     Cord Vessels:           Previously seen                         previously seen  Lips:                  Previously seen        Kidneys:                Appear normal  Palate:                Previously seen        Bladder:                Appears normal  Thoracic:              Appears normal         Spine:                  Appears normal  Heart:                 Appears normal         Upper Extremities:      Previously seen                         (4CH, axis, and                         situs)  RVOT:                  Appears normal         Lower Extremities:      Previously seen  Other:  Heels previously visualized. Fetus appears to be a female. ---------------------------------------------------------------------- Cervix Uterus Adnexa  Cervix  Length:            3.2  cm.  Normal appearance by transabdominal scan. ---------------------------------------------------------------------- Impression  Patient returned for fetal anatomical survey. Amniotic fluid is  normal and good fetal activity is seen. Fetal biometry is  consistent with her previously-established dates. Fetal spine  appears normal. ---------------------------------------------------------------------- Recommendations  -An appointment was made for her to return in 9 weeks for  fetal growth assessment. ----------------------------------------------------------------------                  Noralee Space, MD Electronically Signed Final Report   07/28/2019 01:17 pm ----------------------------------------------------------------------   Assessment and Plan:  Pregnancy: G1P0000 at [redacted]w[redacted]d 1. Supervision of other normal pregnancy, antepartum Patient with hip pain. Rx maternity support belt  provided Third trimester labs with glucola next visit  2. History of hydrocephalus Patient scheduled to see neurologist  3. Obesity in pregnancy Continue ASA Follow up growth in May  Preterm labor symptoms and general obstetric precautions including but not limited to vaginal bleeding, contractions, leaking of fluid and fetal movement were reviewed in detail with the patient. I discussed the assessment and treatment plan with the patient. The patient was provided an opportunity to ask questions and all were answered. The patient agreed with the plan and demonstrated an understanding of the instructions. The patient was advised to call back or seek an in-person office evaluation/go to [facility] at [facility] for any urgent or concerning symptoms. Please refer to After Visit Summary for other counseling recommendations.   I provided 12 minutes of face-to-face time during this encounter.  Return in about 3 weeks (around 09/03/2019) for in person, High risk, 2 hr glucola next visit.  Future Appointments  Date Time Provider Department Center  08/13/2019  3:30 PM Harvis Mabus, Gigi Gin, MD [facility] None  08/21/2019  8:30 AM Levert Feinstein, MD [facility] None  09/29/2019  3:15 PM [facility] NURSE [facility]  09/29/2019  3:15 PM [facility] [place] 2 [facility]    Catalina Antigua, MD Center for [employer], [facility]

## 2019-08-20 ENCOUNTER — Other Ambulatory Visit: Payer: Self-pay

## 2019-08-20 ENCOUNTER — Ambulatory Visit: Payer: 59 | Admitting: Neurology

## 2019-08-20 ENCOUNTER — Ambulatory Visit (INDEPENDENT_AMBULATORY_CARE_PROVIDER_SITE_OTHER): Payer: 59

## 2019-08-20 VITALS — BP 136/79 | HR 93

## 2019-08-20 DIAGNOSIS — Z0130 Encounter for examination of blood pressure without abnormal findings: Secondary | ICD-10-CM

## 2019-08-20 NOTE — Progress Notes (Signed)
Martha Hall is here for a bp check.  Denied dizziness, headache, blurred vision.  Pt reports that her BP's at home are elevated.  I advised pt to contact [facility] to have the bp kit replaced with a new one. Bp in office is within normal range. -EH/RMA

## 2019-08-21 ENCOUNTER — Ambulatory Visit (INDEPENDENT_AMBULATORY_CARE_PROVIDER_SITE_OTHER): Payer: 59 | Admitting: Neurology

## 2019-08-21 ENCOUNTER — Encounter: Payer: Self-pay | Admitting: Neurology

## 2019-08-21 ENCOUNTER — Telehealth: Payer: Self-pay | Admitting: Neurology

## 2019-08-21 VITALS — BP 117/69 | HR 93 | Temp 97.8°F | Ht 61.0 in | Wt 239.5 lb

## 2019-08-21 DIAGNOSIS — Z8669 Personal history of other diseases of the nervous system and sense organs: Secondary | ICD-10-CM | POA: Diagnosis not present

## 2019-08-21 NOTE — Telephone Encounter (Signed)
[employer] ill obtain the auth once its gets closer to her appt. And medicaid [facility] will obtain the auth.  Order was sent to GI. They will reach out to the patient to schedule.

## 2019-08-21 NOTE — Progress Notes (Signed)
PATIENT: Martha Hall DOB: 01/19/1996  Chief Complaint  Patient presents with  . Hx of hydrocephalus    rm 4 New Pt, [redacted] weeks pregnant     HISTORICAL  Martha Hall is a 24 year old female, seen in request by her obstetrician Dr. Emeterio Reeve for evaluation of hydrocephalus, initial evaluation was on August 21, 2019.  I have reviewed and summarized the referring note from the referring physician.  She is currently [redacted] weeks pregnant, this is her first pregnancy, due date will be November 20, 2019.  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  She did have a history of headaches since teenage years, usually holoacranial annoying pressure headache, there was no significant light noise sensitivity, no nausea, lasting for couple days, she used to have it frequently couple times each week, it continued during her early pregnancy, now she rarely has headaches anymore.  She wants to know more information about her hydrocephalus with expected epidural anesthesia during delivery.  Laboratory evaluation in December 2020, A1c 5.6, normal CBC hemoglobin of 12.9, REVIEW OF SYSTEMS: Full 14 system review of systems performed and notable only for as above All other review of systems were negative.  ALLERGIES: No Known Allergies  HOME MEDICATIONS: Current Outpatient Medications  Medication Sig Dispense Refill  . aspirin EC 81 MG tablet Take 1 tablet (81 mg total) by mouth daily. Take after 12 weeks for prevention of preeclampsia later in pregnancy 30 tablet 3  . Blood Pressure Monitoring (BLOOD PRESSURE KIT) DEVI 1 kit by Does not apply route once a week. Check BP regularly and  record readings into the Babyscripts App.  Large Cuff.  DX O90.0 1 each 0  . Doxylamine-Pyridoxine (DICLEGIS) 10-10 MG TBEC Take 2 tablets at bedtime, may add 1 tablet at breakfast & 1 tablet at lunch if needed. 60 tablet 0  . fluticasone (FLONASE) 50 MCG/ACT nasal spray Place 2 sprays into both nostrils daily. 16 g 0  . Prenatal Vit-Fe Fumarate-FA (PRENATAL VITAMIN) 27-0.8 MG TABS Take 1 tablet by mouth daily. 30 tablet 11  . Butalbital-APAP-Caffeine 50-325-40 MG capsule Take 1-2 capsules by mouth every 6 (six) hours as needed for headache. (Patient not taking: Reported on 08/13/2019) 30 capsule 3  . Elastic Bandages & Supports (COMFORT FIT MATERNITY SUPP LG) MISC Wear daily when ambulating (Patient not taking: Reported on 08/21/2019) 1 each 0   No current facility-administered medications for this visit.    PAST MEDICAL HISTORY: Past Medical History:  Diagnosis Date  . Hydrocephalus (HCC)    hx of  . Infection    UTI    PAST SURGICAL HISTORY: Past Surgical History:  Procedure Laterality Date  . BRAIN SURGERY  11-11-1997   stint placed when born    FAMILY HISTORY: Family History  Problem Relation Age of Onset  . Healthy Mother   . Healthy Father     SOCIAL HISTORY: Social History   Socioeconomic History  . Marital status: Single    Spouse name: Not on file  . Number of children: Not on file  . Years of education: Not on file  . Highest education level: Associate degree: occupational, Hotel manager, or vocational program  Occupational History  .  Not on file  Tobacco Use  . Smoking status: Never Smoker  . Smokeless tobacco: Never Used  Substance and Sexual Activity  . Alcohol use: Never  . Drug use: Never  . Sexual activity: Not Currently  Other Topics Concern  . Not on file  Social History Narrative  . Not on file   Social Determinants of Health   Financial Resource Strain:   . Difficulty of Paying Living Expenses:   Food Insecurity:   . Worried About Sales executive in the Last Year:   . Arboriculturist in the Last Year:   Transportation Needs:   . Film/video editor (Medical):   Marland Kitchen Lack of Transportation (Non-Medical):   Physical Activity:   . Days of Exercise per Week:   . Minutes of Exercise per Session:   Stress:   . Feeling of Stress :   Social Connections:   . Frequency of Communication with Friends and Family:   . Frequency of Social Gatherings with Friends and Family:   . Attends Religious Services:   . Active Member of Clubs or Organizations:   . Attends Archivist Meetings:   Marland Kitchen Marital Status:   Intimate Partner Violence:   . Fear of Current or Ex-Partner:   . Emotionally Abused:   Marland Kitchen Physically Abused:   . Sexually Abused:      PHYSICAL EXAM   Vitals:   08/21/19 0811  BP: 117/69  Pulse: 93  Temp: 97.8 F (36.6 C)  Weight: 239 lb 8 oz (108.6 kg)  Height: '5\' 1"'  (1.549 m)    Not recorded      Body mass index is 45.25 kg/m.  PHYSICAL EXAMNIATION:  Gen: NAD, conversant, well nourised, well groomed                     Cardiovascular: Regular rate rhythm, no peripheral edema, warm, nontender. Eyes: Conjunctivae clear without exudates or hemorrhage Neck: Supple, no carotid bruits. Pulmonary: Clear to auscultation bilaterally   NEUROLOGICAL EXAM:  MENTAL STATUS: Speech:    Speech is normal; fluent and spontaneous with normal comprehension.  Cognition:     Orientation to time, place and person     Normal recent and remote memory     Normal Attention span and concentration     Normal Language, naming, repeating,spontaneous speech     Fund of knowledge   CRANIAL NERVES: CN II: Visual fields are full to confrontation. Pupils are round equal and briskly reactive to light. CN III, IV, VI: extraocular movement are normal. No ptosis. CN V: Facial sensation is intact to light touch CN VII: Face is symmetric with normal eye closure  CN VIII: Hearing is normal to causal conversation. CN IX, X:  Phonation is normal. CN XI: Head turning and shoulder shrug are intact  MOTOR: There is no pronator drift of out-stretched arms. Muscle bulk and tone are normal. Muscle strength is normal.  REFLEXES: Reflexes are 2+ and symmetric at the biceps, triceps, knees, and ankles. Plantar responses are flexor.  SENSORY: Intact to light touch, pinprick and vibratory sensation are intact in fingers and toes.  COORDINATION: There is no trunk or limb dysmetria noted.  GAIT/STANCE: Posture is normal. Gait is steady with normal steps, base, arm swing, and turning. Heel and toe walking are normal. Tandem gait is normal.  Romberg is absent.   DIAGNOSTIC DATA (LABS, IMAGING, TESTING) - I reviewed patient records, labs, notes, testing and imaging myself where  available.   ASSESSMENT AND PLAN  Martha Hall is a 24 y.o. female   Reported history of hydrocephalus  Currently [redacted] weeks pregnant, estimated due date is November 20, 2019.  I ordered MRI of the brain without contrast, ideally to be done around 36-37 weeks, before her delivery,  Return to clinic before delivery to review MRIs together  Marcial Pacas, M.D. Ph.D.  [facility] [address] [place], [place] [address] Ph: [phone] Fax: [phone]  CC: Referring Provider

## 2019-08-21 NOTE — Patient Instructions (Signed)
Magnetic resonance imaging (MRI) uses electromagnetic radio waves, rather than ionizing radiation, to generate detailed images. At the cellular level, possible direct biologic effects of MRI consist of (1) induction of local electric fields and currents from the static and time varying magnetic fields, and (2) radiofrequency radiation resulting in heating of tissue. Other potential maternal dangers include trauma from projection of metal objects into the magnetic field (eg, small metal fragments can be projected into the eyes), interference with the operation of electronic devices (eg, cardiac pacemakers) or position of metallic implants, burns from heating of conductive materials in implants, and acoustic damage from high intensity noise. Despite these concerns, there are no reported adverse maternal or fetal effects from MRI during pregnancy [[id number]]. In the largest study (MRI: n = 1737 deliveries; no MRI: n = [phone],196 deliveries), first-trimester MRI was not associated with significantly increased risks for stillbirth, neonatal death, congenital anomaly, neoplasm, or vision or hearing loss in children followed up to age four years when adjustments were made for differences between exposure groups [47]. Magnetic field strengths were not reported but were likely at or below 1.5 Tesla since most cases can be done adequately at this level. A retrospective study of 81 neonates exposed in utero to 3 Tesla during MRI for maternal or fetal indications reported no significant difference in mean birth weight or prevalence of hearing impairment by 71 months of age between the exposed and control neonates [48]. We believe it is probably safe to image at 3 Tesla for neurologic imaging of the maternal head/neck but would only do this if there will be a clear advantage in diagnostic quality (ie, at the recommendation of a neuroradiologist). However, almost all safety studies have been performed predominantly at or below 1.5  Tesla magnetic field strengths. There may be an increased risk of tissue heating at higher field strengths. For example, in an animal study at 3 Tesla, heating effects were shown in amniotic fluid and fetal tissue [49]. In some cases, MRI is the preferred diagnostic modality because it provides better images than ultrasonography while avoiding the ionizing radiation of computed tomography. As an example, first-trimester MRI is a reasonable option in a pregnant patient with suspected appendicitis in whom the appendix cannot be visualized by ultrasound examination. An expert panel on MRI safety opined that MRI should be performed at any stage of pregnancy when the information requested from the MRI study cannot be acquired by means of nonionizing means, the data are needed to potentially affect the care of the patient or fetus during the pregnancy, and it is not prudent to wait until the patient is no longer pregnant to obtain these data [50]. MRI is sometimes used during pregnancy specifically to image the fetus or placenta.

## 2019-08-21 NOTE — Progress Notes (Signed)
[name], female   DOB: 12/14/1995, 24 y.o.   MRN: [id number] Patient seen and assessed by nursing staff during this encounter. I have reviewed the chart and agree with the documentation and plan. I have also made any necessary editorial changes.  Scheryl Darter, MD 08/21/2019 9:24 AM

## 2019-08-30 ENCOUNTER — Encounter (HOSPITAL_COMMUNITY): Payer: Self-pay | Admitting: Obstetrics and Gynecology

## 2019-08-30 ENCOUNTER — Other Ambulatory Visit: Payer: Self-pay

## 2019-08-30 ENCOUNTER — Inpatient Hospital Stay (HOSPITAL_COMMUNITY)
Admission: AD | Admit: 2019-08-30 | Discharge: 2019-08-30 | Disposition: A | Discharge status: Home / Self Care | Payer: 59 | Attending: Obstetrics and Gynecology | Admitting: Obstetrics and Gynecology

## 2019-08-30 DIAGNOSIS — O99891 Other specified diseases and conditions complicating pregnancy: Secondary | ICD-10-CM

## 2019-08-30 DIAGNOSIS — M545 Low back pain: Secondary | ICD-10-CM | POA: Insufficient documentation

## 2019-08-30 DIAGNOSIS — Z3A27 27 weeks gestation of pregnancy: Secondary | ICD-10-CM | POA: Insufficient documentation

## 2019-08-30 DIAGNOSIS — Z7982 Long term (current) use of aspirin: Secondary | ICD-10-CM | POA: Diagnosis not present

## 2019-08-30 DIAGNOSIS — O26892 Other specified pregnancy related conditions, second trimester: Secondary | ICD-10-CM | POA: Insufficient documentation

## 2019-08-30 DIAGNOSIS — O361990 Maternal care for other isoimmunization, unspecified trimester, not applicable or unspecified: Secondary | ICD-10-CM

## 2019-08-30 DIAGNOSIS — Y92410 Unspecified street and highway as the place of occurrence of the external cause: Secondary | ICD-10-CM | POA: Diagnosis not present

## 2019-08-30 DIAGNOSIS — O99210 Obesity complicating pregnancy, unspecified trimester: Secondary | ICD-10-CM

## 2019-08-30 DIAGNOSIS — A5901 Trichomonal vulvovaginitis: Secondary | ICD-10-CM

## 2019-08-30 DIAGNOSIS — A749 Chlamydial infection, unspecified: Secondary | ICD-10-CM

## 2019-08-30 DIAGNOSIS — R519 Headache, unspecified: Secondary | ICD-10-CM | POA: Diagnosis not present

## 2019-08-30 DIAGNOSIS — D563 Thalassemia minor: Secondary | ICD-10-CM

## 2019-08-30 LAB — URINALYSIS, ROUTINE W REFLEX MICROSCOPIC
Bacteria, UA: NONE SEEN
Bilirubin Urine: NEGATIVE
Glucose, UA: 150 mg/dL — AB
Hgb urine dipstick: NEGATIVE
Ketones, ur: NEGATIVE mg/dL
Leukocytes,Ua: NEGATIVE
Nitrite: NEGATIVE
Protein, ur: 30 mg/dL — AB
Specific Gravity, Urine: 1.024 (ref 1.005–1.030)
Squamous Epithelial / HPF: >50 — ABNORMAL HIGH (ref 0–5)
pH: 6 (ref 5.0–8.0)

## 2019-08-30 MED ORDER — ACETAMINOPHEN 500 MG PO TABS
1000.0000 mg | ORAL_TABLET | Freq: Four times a day (QID) | ORAL | Status: DC | PRN
  Administered 2019-08-30: 1000 mg via ORAL
  Filled 2019-08-30: qty 2

## 2019-08-30 MED ORDER — CYCLOBENZAPRINE HCL 5 MG PO TABS
10.0000 mg | ORAL_TABLET | Freq: Three times a day (TID) | ORAL | Status: DC | PRN
  Administered 2019-08-30: 17:00:00 10 mg via ORAL
  Filled 2019-08-30: qty 2

## 2019-08-30 NOTE — MAU Note (Signed)
Attempted to adjust fetal monitors, however patient eating. Difficult to trace baby. Tracing reactive prior to meal. Pt to call out once she is finished eating and will adjust monitors afterwards
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
## 2019-08-30 NOTE — Discharge Instructions (Signed)

## 2019-09-04 ENCOUNTER — Other Ambulatory Visit: Payer: 59

## 2019-09-04 ENCOUNTER — Ambulatory Visit (INDEPENDENT_AMBULATORY_CARE_PROVIDER_SITE_OTHER): Payer: 59 | Admitting: Obstetrics and Gynecology

## 2019-09-04 ENCOUNTER — Other Ambulatory Visit: Payer: Self-pay

## 2019-09-04 ENCOUNTER — Encounter: Payer: Self-pay | Admitting: Obstetrics and Gynecology

## 2019-09-04 VITALS — BP 123/72 | HR 108 | Wt 240.0 lb

## 2019-09-04 DIAGNOSIS — O99210 Obesity complicating pregnancy, unspecified trimester: Secondary | ICD-10-CM

## 2019-09-04 DIAGNOSIS — O24410 Gestational diabetes mellitus in pregnancy, diet controlled: Secondary | ICD-10-CM

## 2019-09-04 DIAGNOSIS — Z3480 Encounter for supervision of other normal pregnancy, unspecified trimester: Secondary | ICD-10-CM | POA: Diagnosis not present

## 2019-09-04 DIAGNOSIS — D563 Thalassemia minor: Secondary | ICD-10-CM

## 2019-09-04 DIAGNOSIS — Z23 Encounter for immunization: Secondary | ICD-10-CM | POA: Diagnosis not present

## 2019-09-04 MED ORDER — CYCLOBENZAPRINE HCL 10 MG PO TABS
10.0000 mg | ORAL_TABLET | Freq: Three times a day (TID) | ORAL | 2 refills | Status: DC | PRN
Start: 2019-09-04 — End: 2019-11-20

## 2019-09-04 NOTE — Patient Instructions (Addendum)
[place], [place]  [facility] [address] [phone]  [facility] Main: [address] [phone] [place]: [place] [address] [place], [place]  [place] [address], [place]  [facility] [address], [address], [place]: [address], [address] [place] [place]: [place], [address] [place] [phone] [place]: [address] [phone] [place]: [address], [address] [phone]  [facility] [address] [phone]  [facility] [address] [place] [facility] [place], [place]  [facility] [address] [place], [place]  [place], [place]  [facility] [place], [place]  [facility] [address], [place], [place] [phone]  [facility]) [place] @ [place]: [address], [place] @ [place]: [address], [place] [phone] Peds @ [place] [place]: [address], [place] Peds @ [place]: [place], [place]  [facility] [address] [place], [place]  [facility] [address], [place] [phone]  Private Pediatrician Baltazar Najjar, MD [address], [place], [place] [phone] Saddie Benders, MD [place], [place] Karleen Dolphin, MD [address], [address] [phone] Melody Comas, MD [facility]) [address] [place]       Contraception Choices Contraception, also called birth control, refers to methods or devices that prevent pregnancy. Hormonal methods Contraceptive implant  A contraceptive implant is a thin, plastic tube that contains a hormone. It is inserted into the upper part of the arm. It can remain in place for up to 3 years. Progestin-only injections Progestin-only injections are injections of progestin, a synthetic form of the hormone progesterone. They are given every 3 months by a health care provider. Birth control pills  Birth control pills are pills that contain hormones that prevent pregnancy. They must be taken once a day, preferably at the same time each day. Birth control patch  The birth control patch contains hormones that prevent pregnancy. It is placed on the skin and must be changed once a week for three weeks and removed on the fourth week. A prescription is needed to use this method of contraception. Vaginal ring  A vaginal ring contains hormones that prevent pregnancy. It is placed in the vagina for three weeks and removed on the fourth week. After that, the process is repeated with a new ring. A prescription is needed to use this method of contraception. Emergency contraceptive Emergency contraceptives prevent pregnancy after unprotected sex. They come in pill form and can be taken up to 5 days after sex. They work best the sooner they are taken after having sex. Most emergency contraceptives are available without a prescription. This method should not be used as your only form of birth control. Barrier methods Female condom  A female condom is a thin sheath that is worn over the penis during sex. Condoms keep sperm from going inside a woman's body. They can be used with a spermicide to increase their effectiveness. They should be disposed after a single  use. Female condom  A female condom is a soft,  loose-fitting sheath that is put into the vagina before sex. The condom keeps sperm from going inside a woman's body. They should be disposed after a single use. Diaphragm  A diaphragm is a soft, dome-shaped barrier. It is inserted into the vagina before sex, along with a spermicide. The diaphragm blocks sperm from entering the uterus, and the spermicide kills sperm. A diaphragm should be left in the vagina for 6-8 hours after sex and removed within 24 hours. A diaphragm is prescribed and fitted by a health care provider. A diaphragm should be replaced every 1-2 years, after giving birth, after gaining more than 15 lb (6.8 kg), and after pelvic surgery. Cervical cap  A cervical cap is a round, soft latex or plastic cup that fits over the cervix. It is inserted into the vagina before sex, along with spermicide. It blocks sperm from entering the uterus. The cap should be left in place for 6-8 hours after sex and removed within 48 hours. A cervical cap must be prescribed and fitted by a health care provider. It should be replaced every 2 years. Sponge  A sponge is a soft, circular piece of polyurethane foam with spermicide on it. The sponge helps block sperm from entering the uterus, and the spermicide kills sperm. To use it, you make it wet and then insert it into the vagina. It should be inserted before sex, left in for at least 6 hours after sex, and removed and thrown away within 30 hours. Spermicides Spermicides are chemicals that kill or block sperm from entering the cervix and uterus. They can come as a cream, jelly, suppository, foam, or tablet. A spermicide should be inserted into the vagina with an applicator at least 10-15 minutes before sex to allow time for it to work. The process must be repeated every time you have sex. Spermicides do not require a prescription. Intrauterine contraception Intrauterine device (IUD) An IUD is a T-shaped  device that is put in a woman's uterus. There are two types:  Hormone IUD.This type contains progestin, a synthetic form of the hormone progesterone. This type can stay in place for 3-5 years.  Copper IUD.This type is wrapped in copper wire. It can stay in place for 10 years.  Permanent methods of contraception Female tubal ligation In this method, a woman's fallopian tubes are sealed, tied, or blocked during surgery to prevent eggs from traveling to the uterus. Hysteroscopic sterilization In this method, a small, flexible insert is placed into each fallopian tube. The inserts cause scar tissue to form in the fallopian tubes and block them, so sperm cannot reach an egg. The procedure takes about 3 months to be effective. Another form of birth control must be used during those 3 months. Female sterilization This is a procedure to tie off the tubes that carry sperm (vasectomy). After the procedure, the man can still ejaculate fluid (semen). Natural planning methods Natural family planning In this method, a couple does not have sex on days when the woman could become pregnant. Calendar method This means keeping track of the length of each menstrual cycle, identifying the days when pregnancy can happen, and not having sex on those days. Ovulation method In this method, a couple avoids sex during ovulation. Symptothermal method This method involves not having sex during ovulation. The woman typically checks for ovulation by watching changes in her temperature and in the consistency of cervical mucus. Post-ovulation method In this method, a couple waits to have sex until after ovulation. Summary  Contraception, also called birth control, means methods or devices that prevent pregnancy.  Hormonal methods of contraception include implants, injections, pills, patches, vaginal rings, and emergency contraceptives.  Barrier methods of contraception can include female condoms, female condoms,  diaphragms, cervical caps, sponges, and spermicides.  There are two types of IUDs (intrauterine devices). An IUD can be put in a woman's uterus to prevent pregnancy for 3-5 years.  Permanent sterilization can be done through a procedure for males, females, or both.  Natural family planning methods involve not having sex on days when the woman could become pregnant. This information is not intended to replace advice given to you by your health care provider. Make sure you discuss any questions you have with your health care provider. Document Revised: 04/26/2017 Document Reviewed: 05/27/2016 Elsevier Patient Education  2020 [employer].

## 2019-09-04 NOTE — Progress Notes (Signed)
   PRENATAL VISIT NOTE  Subjective:  Martha Hall is a 24 y.o. G1P0000 at [redacted]w[redacted]d being seen today for ongoing prenatal care.  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  Patient reports no complaints.  Contractions: Not present. Vag. Bleeding: None.  Movement: Present. Denies leaking of fluid.   The following portions of the patient's history were reviewed and updated as appropriate: allergies, current medications, past family history, past medical history, past social history, past surgical history and problem list.   Objective:   Vitals:   09/04/19 0930  BP: 123/72  Pulse: (!) 108  Weight: 240 lb (108.9 kg)    Fetal Status: Fetal Heart Rate (bpm): 144 Fundal Height: 29 cm Movement: Present     General:  Alert, oriented and cooperative. Patient is in no acute distress.  Skin: Skin is warm and dry. No rash noted.   Cardiovascular: Normal heart rate noted  Respiratory: Normal respiratory effort, no problems with respiration noted  Abdomen: Soft, gravid, appropriate for gestational age.  Pain/Pressure: Absent     Pelvic: Cervical exam deferred        Extremities: Normal range of motion.  Edema: None  Mental Status: Normal mood and affect. Normal behavior. Normal judgment and thought content.   Assessment and Plan:  Pregnancy: G1P0000 at [redacted]w[redacted]d 1. Supervision of other normal pregnancy, antepartum Patient is doing well without complaints Third trimester labs with glucola today Patient is still researching pediatricians and is undecided on contraception  2. Alpha thalassemia silent carrier Patient declined genetic referral  3. Obesity in  pregnancy Continue ASA Follow up growth scan 5/24  4. History of hydrocephalus  Patient seen by neurologist and scheduled for MRI 6/24  Preterm labor symptoms and general obstetric precautions including but not limited to vaginal bleeding, contractions, leaking of fluid and fetal movement were reviewed in detail with the patient. Please refer to After Visit Summary for other counseling recommendations.   Return in about 2 weeks (around 09/18/2019) for ROB, High risk, Virtual.  Future Appointments  Date Time Provider Department Center  09/29/2019  3:15 PM WH-M[facility] NURSE WH-[facility] MFC-US  09/29/2019  3:15 PM WH-[facility]FC [place] 3 WH-MFCUS MFC-US  10/30/2019  9:00 AM GI-315 MR 2 GI-315MRI GI-315 [address]  11/20/2019  8:15 AM Glean Salvo, NP GNA-[facility]NA None    Catalina Antigua, MD

## 2019-09-05 LAB — CBC
Hematocrit: 36.7 % (ref 34.0–46.6)
Hemoglobin: 11.8 g/dL (ref 11.1–15.9)
MCH: 27.7 pg (ref 26.6–33.0)
MCHC: 32.2 g/dL (ref 31.5–35.7)
MCV: 86 fL (ref 79–97)
Platelets: 261 10*3/uL (ref 150–450)
RBC: 4.26 x10E6/uL (ref 3.77–5.28)
RDW: 13.5 % (ref 11.7–15.4)
WBC: 8.1 10*3/uL (ref 3.4–10.8)

## 2019-09-05 LAB — GLUCOSE TOLERANCE, 2 HOURS W/ 1HR
Glucose, 1 hour: 192 mg/dL — ABNORMAL HIGH (ref 65–179)
Glucose, 2 hour: 171 mg/dL — ABNORMAL HIGH (ref 65–152)
Glucose, Fasting: 96 mg/dL — ABNORMAL HIGH (ref 65–91)

## 2019-09-05 LAB — HIV ANTIBODY (ROUTINE TESTING W REFLEX): HIV Screen 4th Generation wRfx: NONREACTIVE

## 2019-09-05 LAB — RPR: RPR Ser Ql: NONREACTIVE

## 2019-09-06 DIAGNOSIS — O24419 Gestational diabetes mellitus in pregnancy, unspecified control: Secondary | ICD-10-CM

## 2019-09-06 HISTORY — DX: Gestational diabetes mellitus in pregnancy, unspecified control: O24.419

## 2019-09-08 ENCOUNTER — Other Ambulatory Visit: Payer: Self-pay | Admitting: Obstetrics and Gynecology

## 2019-09-08 DIAGNOSIS — O24415 Gestational diabetes mellitus in pregnancy, controlled by oral hypoglycemic drugs: Secondary | ICD-10-CM

## 2019-09-08 DIAGNOSIS — O24410 Gestational diabetes mellitus in pregnancy, diet controlled: Secondary | ICD-10-CM | POA: Insufficient documentation

## 2019-09-08 HISTORY — DX: Gestational diabetes mellitus in pregnancy, controlled by oral hypoglycemic drugs: O24.415

## 2019-09-08 MED ORDER — ACCU-CHEK SOFTCLIX LANCETS MISC
12 refills | Status: DC
Start: 2019-09-08 — End: 2019-11-20

## 2019-09-08 MED ORDER — GLUCOSE BLOOD VI STRP: ORAL_STRIP | 12 refills | Status: DC

## 2019-09-08 MED ORDER — ACCU-CHEK GUIDE W/DEVICE KIT: 1.0000 | PACK | Freq: Four times a day (QID) | 0 refills | Status: DC

## 2019-09-08 NOTE — Addendum Note (Signed)
Addended by: Catalina Antigua on: 09/08/2019 08:41 AM   Modules accepted: Orders

## 2019-09-15 ENCOUNTER — Other Ambulatory Visit: Payer: Self-pay

## 2019-09-15 ENCOUNTER — Encounter (HOSPITAL_COMMUNITY): Payer: Self-pay | Admitting: Obstetrics & Gynecology

## 2019-09-15 ENCOUNTER — Inpatient Hospital Stay (HOSPITAL_COMMUNITY)
Admission: AD | Admit: 2019-09-15 | Discharge: 2019-09-15 | Disposition: A | Discharge status: Home / Self Care | Payer: Medicaid Other | Source: Ambulatory Visit | Attending: Obstetrics & Gynecology | Admitting: Obstetrics & Gynecology

## 2019-09-15 DIAGNOSIS — R109 Unspecified abdominal pain: Secondary | ICD-10-CM | POA: Diagnosis present

## 2019-09-15 DIAGNOSIS — O24410 Gestational diabetes mellitus in pregnancy, diet controlled: Secondary | ICD-10-CM | POA: Diagnosis not present

## 2019-09-15 DIAGNOSIS — O26893 Other specified pregnancy related conditions, third trimester: Secondary | ICD-10-CM | POA: Diagnosis not present

## 2019-09-15 DIAGNOSIS — R12 Heartburn: Secondary | ICD-10-CM | POA: Diagnosis not present

## 2019-09-15 DIAGNOSIS — Z3A30 30 weeks gestation of pregnancy: Secondary | ICD-10-CM | POA: Diagnosis not present

## 2019-09-15 LAB — COMPREHENSIVE METABOLIC PANEL
ALT: 17 U/L (ref 0–44)
AST: 15 U/L (ref 15–41)
Albumin: 2.8 g/dL — ABNORMAL LOW (ref 3.5–5.0)
Alkaline Phosphatase: 118 U/L (ref 38–126)
Anion gap: 10 (ref 5–15)
BUN: 5 mg/dL — ABNORMAL LOW (ref 6–20)
CO2: 22 mmol/L (ref 22–32)
Calcium: 9.6 mg/dL (ref 8.9–10.3)
Chloride: 106 mmol/L (ref 98–111)
Creatinine, Ser: 0.62 mg/dL (ref 0.44–1.00)
GFR calc Af Amer: >60 mL/min (ref >60)
GFR calc non Af Amer: >60 mL/min (ref >60)
Glucose, Bld: 109 mg/dL — ABNORMAL HIGH (ref 70–99)
Potassium: 4.4 mmol/L (ref 3.5–5.1)
Sodium: 138 mmol/L (ref 135–145)
Total Bilirubin: 0.3 mg/dL (ref 0.3–1.2)
Total Protein: 6.6 g/dL (ref 6.5–8.1)

## 2019-09-15 LAB — URINALYSIS, ROUTINE W REFLEX MICROSCOPIC
Bilirubin Urine: NEGATIVE
Glucose, UA: NEGATIVE mg/dL
Hgb urine dipstick: NEGATIVE
Ketones, ur: NEGATIVE mg/dL
Nitrite: NEGATIVE
Protein, ur: NEGATIVE mg/dL
Specific Gravity, Urine: 1.014 (ref 1.005–1.030)
pH: 7 (ref 5.0–8.0)

## 2019-09-15 LAB — LIPASE, BLOOD: Lipase: 23 U/L (ref 11–51)

## 2019-09-15 LAB — CBC WITH DIFFERENTIAL/PLATELET
Abs Immature Granulocytes: 0.05 10*3/uL (ref 0.00–0.07)
Basophils Absolute: 0 10*3/uL (ref 0.0–0.1)
Basophils Relative: 0 %
Eosinophils Absolute: 0.1 10*3/uL (ref 0.0–0.5)
Eosinophils Relative: 1 %
HCT: 38.7 % (ref 36.0–46.0)
Hemoglobin: 12.3 g/dL (ref 12.0–15.0)
Immature Granulocytes: 1 %
Lymphocytes Relative: 15 %
Lymphs Abs: 1.2 10*3/uL (ref 0.7–4.0)
MCH: 27.6 pg (ref 26.0–34.0)
MCHC: 31.8 g/dL (ref 30.0–36.0)
MCV: 86.8 fL (ref 80.0–100.0)
Monocytes Absolute: 0.6 10*3/uL (ref 0.1–1.0)
Monocytes Relative: 8 %
Neutro Abs: 5.7 10*3/uL (ref 1.7–7.7)
Neutrophils Relative %: 75 %
Platelets: 278 10*3/uL (ref 150–400)
RBC: 4.46 MIL/uL (ref 3.87–5.11)
RDW: 13.3 % (ref 11.5–15.5)
WBC: 7.5 10*3/uL (ref 4.0–10.5)
nRBC: 0 % (ref 0.0–0.2)

## 2019-09-15 MED ORDER — ONDANSETRON 4 MG PO TBDP
8.0000 mg | ORAL_TABLET | Freq: Once | ORAL | Status: AC
  Administered 2019-09-15: 8 mg via ORAL
  Filled 2019-09-15: qty 2

## 2019-09-15 MED ORDER — ALUM & MAG HYDROXIDE-SIMETH 200-200-20 MG/5ML PO SUSP
30.0000 mL | Freq: Once | ORAL | Status: AC
  Administered 2019-09-15: 30 mL via ORAL
  Filled 2019-09-15: qty 30

## 2019-09-15 MED ORDER — ACETAMINOPHEN 500 MG PO TABS
1000.0000 mg | ORAL_TABLET | Freq: Once | ORAL | Status: AC
  Administered 2019-09-15: 1000 mg via ORAL
  Filled 2019-09-15: qty 2

## 2019-09-15 MED ORDER — LIDOCAINE VISCOUS HCL 2 % MT SOLN
15.0000 mL | Freq: Once | OROMUCOSAL | Status: AC
  Administered 2019-09-15: 15 mL via ORAL
  Filled 2019-09-15: qty 15

## 2019-09-15 MED ORDER — FAMOTIDINE 20 MG PO TABS: 20.0000 mg | ORAL_TABLET | Freq: Two times a day (BID) | ORAL | 0 refills | Status: DC

## 2019-09-15 NOTE — Discharge Instructions (Signed)
Gestational Diabetes Mellitus, Self Care  Caring for yourself after you have been diagnosed with gestational diabetes (gestational diabetes mellitus) means keeping your blood sugar (glucose) under control. You can do that with a balance of:   Nutrition.   Exercise.   Lifestyle changes.   Medicines or insulin, if necessary.   Support from your team of health care providers and others.  The following information explains what you need to know to manage your gestational diabetes at home.  What are the risks?  If gestational diabetes is treated, it is unlikely to cause problems. If it is not controlled with treatment, it may cause problems during labor and delivery, and some of those problems can be harmful to the unborn baby (fetus) and the mother. Uncontrolled gestational diabetes may also cause the newborn baby to have breathing problems and low blood glucose.  Women who get gestational diabetes are more likely to develop it if they get pregnant again, and they are more likely to develop type 2 diabetes in the future.  How to monitor blood glucose     Check your blood glucose every day during your pregnancy. Do this as often as told by your health care provider.   Contact your health care provider if your blood glucose is above your target for two tests in a row.  Your health care provider will set individualized treatment goals for you. Generally, the goal of treatment is to maintain the following blood glucose levels during pregnancy:   Before meals (preprandial): at or below 95 mg/dL (5.3 mmol/L).   After meals (postprandial):  ? One hour after a meal: at or below 140 mg/dL (7.8 mmol/L).  ? Two hours after a meal: at or below 120 mg/dL (6.7 mmol/L).   A1c (hemoglobin A1c) level: 6-6.5%.  How to manage hyperglycemia and hypoglycemia  Hyperglycemia symptoms  Hyperglycemia, also called high blood glucose, occurs when blood glucose is too high. Make sure you know the early signs of hyperglycemia, such  as:   Increased thirst.   Hunger.   Feeling very tired.   Needing to urinate more often than usual.   Blurry vision.  Hypoglycemia symptoms  Hypoglycemia, also called low blood glucose, occurs with a blood glucose level at or below 70 mg/dL (3.9 mmol/L). The risk for hypoglycemia increases during or after exercise, during sleep, during illness, and when skipping meals or not eating for a long time (fasting). Symptoms may include:   Hunger.   Anxiety.   Sweating and feeling clammy.   Confusion.   Dizziness or feeling light-headed.   Sleepiness.   Nausea.   Increased heart rate.   Headache.   Blurry vision.   Irritability.   Tingling or numbness around the mouth, lips, or tongue.   A change in coordination.   Restless sleep.   Fainting.   Seizure.  It is important to know the symptoms of hypoglycemia and treat it right away. Always have a 15-gram rapid-acting carbohydrate snack with you to treat low blood glucose. Family members and close friends should also know the symptoms and should understand how to treat hypoglycemia, in case you are not able to treat yourself.  Treating hypoglycemia  If you are alert and able to swallow safely, follow the 15:15 rule:   Take 15 grams of a rapid-acting carbohydrate. Talk with your health care provider about how much you should take.   Rapid-acting options include:  ? Glucose pills (take 15 grams).  ? 6-8 pieces of hard candy.  ?   mmol/L), take 15 grams of a carbohydrate again.  If your blood glucose level does not increase above 70 mg/dL (3.9 mmol/L) after 3 tries, seek emergency medical care.  After your blood glucose level returns to normal, eat a meal or a snack within 1 hour. Treating  severe hypoglycemia Severe hypoglycemia is when your blood glucose level is at or below 54 mg/dL (3 mmol/L). Severe hypoglycemia is an emergency. Do not wait to see if the symptoms will go away. Get medical help right away. Call your local emergency services (911 in the [place] If you have severe hypoglycemia and you cannot eat or drink, you may need an injection of glucagon. A family member or close friend should learn how to check your blood glucose and how to give you a glucagon injection. Ask your health care provider if you need to have an emergency glucagon injection kit available. Severe hypoglycemia may need to be treated in a hospital. The treatment may include getting glucose through an IV. You may also need treatment for the cause of your hypoglycemia. Follow these instructions at home: Take diabetes medicines as told  If your health care provider prescribed insulin or diabetes medicines, take them every day.  Do not run out of insulin or other diabetes medicines that you take. Plan ahead so you always have these available.  If you use insulin, adjust your dosage based on how physically active you are and what foods you eat. Your health care provider will tell you how to adjust your dosage. Make healthy food choices  The things that you eat and drink affect your blood glucose (and your insulin dose, if this applies). Making good choices helps to control your diabetes and prevent other health problems. A healthy meal plan includes eating lean proteins, complex carbohydrates, fresh fruits and vegetables, low-fat dairy products, and healthy fats. Make an appointment to see a diet and nutrition specialist (registered dietitian) to help you create an eating plan that is right for you. Make sure that you:  Follow instructions from your health care provider about eating or drinking restrictions.  Drink enough fluid to keep your urine pale yellow.  Eat healthy snacks between nutritious  meals.  Keep a record of the carbohydrates that you eat. Do this by reading food labels and learning the standard serving sizes of foods.  Follow your sick day plan whenever you cannot eat or drink as usual. Make this plan in advance with your health care provider.  Stay active  Do 30 or more minutes of physical activity a day, or as much physical activity as your health care provider recommends during your pregnancy. ? Doing 10 minutes of exercise starting 30 minutes after each meal may help to control postprandial blood glucose levels.  If you start a new exercise or activity, work with your health care provider to adjust your insulin, medicines, or food intake as needed. Make healthy lifestyle choices  Do not drink alcohol.  Do not use any tobacco products, such as cigarettes, chewing tobacco, and e-cigarettes. If you need help quitting, ask your health care provider.  Learn to manage stress. If you need help with this, ask your health care provider. Care for your body  Keep your immunizations up to date.  Brush your teeth and gums two times a day, and floss one or more times a day. Visit your dentist one or more times every 6 months.  Maintain a healthy weight during your pregnancy. General instructions  Take over-the-counter and  prescription medicines only as told by your health care provider.  Talk with your health care provider about your risk for high blood pressure during pregnancy (preeclampsia or eclampsia).  Share your diabetes management plan with people in your workplace, school, and household.  Check your urine for ketones during your pregnancy when you are ill and as told by your health care provider.  Carry a medical alert card or wear medical alert jewelry that says you have gestational diabetes.  Keep all follow-up visits during your pregnancy (prenatal) and after delivery (postnatal) as told by your health care provider. This is important. Get the care that  you need after delivery  Have your blood glucose level checked 4-12 weeks after delivery. This is done with an oral glucose tolerance test (OGTT).  Get screened for diabetes at least every 3 years, or as often as told by your health care provider. Questions to ask your health care provider  Do I need to meet with a diabetes educator?  Where can I find a support group for people with gestational diabetes? Where to find more information For more information about gestational diabetes, visit:  American Diabetes Association (ADA): www.diabetes.org  Centers for Disease Control and Prevention (CDC): www.cdc.gov Summary  Check your blood glucose every day during your pregnancy. Do this as often as told by your health care provider.  If your health care provider prescribed insulin or diabetes medicines, take them every day as told.  Keep all follow-up visits during your pregnancy (prenatal) and after delivery (postnatal) as told by your health care provider. This is important.  Have your blood glucose level checked 4-12 weeks after delivery. This information is not intended to replace advice given to you by your health care provider. Make sure you discuss any questions you have with your health care provider. Document Revised: 10/15/2017 Document Reviewed: 05/28/2015 Elsevier Patient Education  2020 [employer] Inc[employer]  

## 2019-09-15 NOTE — MAU Note (Signed)
Pt reports headache that started this morning at 7 a.m. Shortly afterward she starting having pain in her upper abdominal area, following by vomiting for 30-45 mins. Vomiting has resolved but still having nausea. Denies LOF, VB or cntrx. +FM.

## 2019-09-15 NOTE — MAU Provider Note (Signed)
Chief Complaint:  Abdominal Pain and Headache   First Provider Initiated Contact with Patient 09/15/19 1005     HPI: Martha Hall is a 24 y.o. G1P0000 at 53w1dwho presents to maternity admissions reporting abdominal pain & headache. Symptoms started this morning. Reports epigastric pain that is sharp. Vomited several times this morning. No longer vomiting but continues to be nauseated. Also reports headache that started with her symptoms. Rates 7/10. Hasn't treated because she couldn't keep anything down. Ate a hamburger & french fried last night. At 1 am had chicken broth.  Denies contractions, diarrhea, or fever. No vaginal bleeding or LOF. Good fetal movement.   Location: epigastric area Quality: sharp Severity: 5/10 in pain scale Duration: 1 day Timing: intermittent Modifying factors: none Associated signs and symptoms: n/v, headache  Pregnancy Course: [facility]. GDM - has not seen nutritionist yet  Past Medical History:  Diagnosis Date  . Gestational diabetes   . Hydrocephalus (HCC)    hx of  . Infection    UTI   OB History  Gravida Para Term Preterm AB Living  1       0 0  SAB TAB Ectopic Multiple Live Births  0            # Outcome Date GA Lbr Len/2nd Weight Sex Delivery Anes PTL Lv  1 Current            Past Surgical History:  Procedure Laterality Date  . BRAIN SURGERY  01999-01-03  stint placed when born   Family History  Problem Relation Age of Onset  . Healthy Mother   . Healthy Father    Social History   Tobacco Use  . Smoking status: Never Smoker  . Smokeless tobacco: Never Used  Substance Use Topics  . Alcohol use: Never  . Drug use: Never   No Known Allergies No medications prior to admission.    I have reviewed patient's Past Medical Hx, Surgical Hx, Family Hx, Social Hx, medications and allergies.   ROS:  Review of Systems  Constitutional: Negative.   Gastrointestinal: Positive for abdominal pain, nausea and vomiting. Negative for  constipation and diarrhea.  Genitourinary: Negative.   Neurological: Positive for headaches.    Physical Exam   Patient Vitals for the past 24 hrs:  BP Temp Temp src Pulse Resp SpO2 Height Weight  09/15/19 1329 127/71 98.4 F (36.9 C) Oral 99 17 100 % -- --  09/15/19 1154 138/75 98.6 F (37 C) Oral (!) 107 15 100 % -- --  09/15/19 0950 105/74 98.7 F (37.1 C) Oral (!) 108 16 100 % -- --  09/15/19 0935 118/66 -- Oral (!) 102 20 99 % '5\' 1"'  (1.549 m) 108.8 kg    Constitutional: Well-developed, well-nourished female in no acute distress.  Cardiovascular: normal rate & rhythm, no murmur Respiratory: normal effort, lung sounds clear throughout GI: Abd soft, non-tender, gravid appropriate for gestational age. Pos BS x 4 MS: Extremities nontender, no edema, normal ROM Neurologic: Alert and oriented x 4.   NST:  Baseline: 125 bpm, Variability: Good {> 6 bpm), Accelerations: Reactive and Decelerations: Absent   Labs: Results for orders placed or performed during the hospital encounter of 09/15/19 (from the past 24 hour(s))  Urinalysis, Routine w reflex microscopic     Status: Abnormal   Collection Time: 09/15/19  9:38 AM  Result Value Ref Range   Color, Urine YELLOW YELLOW   APPearance CLOUDY (A) CLEAR   Specific Gravity, Urine 1.014 1.005 -  1.030   pH 7.0 5.0 - 8.0   Glucose, UA NEGATIVE NEGATIVE mg/dL   Hgb urine dipstick NEGATIVE NEGATIVE   Bilirubin Urine NEGATIVE NEGATIVE   Ketones, ur NEGATIVE NEGATIVE mg/dL   Protein, ur NEGATIVE NEGATIVE mg/dL   Nitrite NEGATIVE NEGATIVE   Leukocytes,Ua SMALL (A) NEGATIVE   RBC / HPF 0-5 0 - 5 RBC/hpf   WBC, UA 0-5 0 - 5 WBC/hpf   Bacteria, UA MANY (A) NONE SEEN   Squamous Epithelial / LPF 21-50 0 - 5   Mucus PRESENT    Amorphous Crystal PRESENT   CBC with Differential/Platelet     Status: None   Collection Time: 09/15/19 10:41 AM  Result Value Ref Range   WBC 7.5 4.0 - 10.5 K/uL   RBC 4.46 3.87 - 5.11 MIL/uL   Hemoglobin 12.3 12.0  - 15.0 g/dL   HCT 38.7 36.0 - 46.0 %   MCV 86.8 80.0 - 100.0 fL   MCH 27.6 26.0 - 34.0 pg   MCHC 31.8 30.0 - 36.0 g/dL   RDW 13.3 11.5 - 15.5 %   Platelets 278 150 - 400 K/uL   nRBC 0.0 0.0 - 0.2 %   Neutrophils Relative % 75 %   Neutro Abs 5.7 1.7 - 7.7 K/uL   Lymphocytes Relative 15 %   Lymphs Abs 1.2 0.7 - 4.0 K/uL   Monocytes Relative 8 %   Monocytes Absolute 0.6 0.1 - 1.0 K/uL   Eosinophils Relative 1 %   Eosinophils Absolute 0.1 0.0 - 0.5 K/uL   Basophils Relative 0 %   Basophils Absolute 0.0 0.0 - 0.1 K/uL   Immature Granulocytes 1 %   Abs Immature Granulocytes 0.05 0.00 - 0.07 K/uL  Comprehensive metabolic panel     Status: Abnormal   Collection Time: 09/15/19 10:41 AM  Result Value Ref Range   Sodium 138 135 - 145 mmol/L   Potassium 4.4 3.5 - 5.1 mmol/L   Chloride 106 98 - 111 mmol/L   CO2 22 22 - 32 mmol/L   Glucose, Bld 109 (H) 70 - 99 mg/dL   BUN 5 (L) 6 - 20 mg/dL   Creatinine, Ser 0.62 0.44 - 1.00 mg/dL   Calcium 9.6 8.9 - 10.3 mg/dL   Total Protein 6.6 6.5 - 8.1 g/dL   Albumin 2.8 (L) 3.5 - 5.0 g/dL   AST 15 15 - 41 U/L   ALT 17 0 - 44 U/L   Alkaline Phosphatase 118 38 - 126 U/L   Total Bilirubin 0.3 0.3 - 1.2 mg/dL   GFR calc non Af Amer >60 >60 mL/min   GFR calc Af Amer >60 >60 mL/min   Anion gap 10 5 - 15  Lipase, blood     Status: None   Collection Time: 09/15/19 10:41 AM  Result Value Ref Range   Lipase 23 11 - 51 U/L    Imaging:  No results found.  MAU Course: Orders Placed This Encounter  Procedures  . Urinalysis, Routine w reflex microscopic  . CBC with Differential/Platelet  . Comprehensive metabolic panel  . Lipase, blood  . Discharge patient   Meds ordered this encounter  Medications  . ondansetron (ZOFRAN-ODT) disintegrating tablet 8 mg  . acetaminophen (TYLENOL) tablet 1,000 mg  . AND Linked Order Group   . alum & mag hydroxide-simeth (MAALOX/MYLANTA) 200-200-20 MG/5ML suspension 30 mL   . lidocaine (XYLOCAINE) 2 % viscous  mouth solution 15 mL  . famotidine (PEPCID) 20 MG tablet  Sig: Take 1 tablet (20 mg total) by mouth 2 (two) times daily.    Dispense:  60 tablet    Refill:  0    Order Specific Question:   Supervising Provider    Answer:   Woodroe Mode [[id number]]    MDM: Fetal tracing appropriate for gestational age  CBC, CMP, lipase -- pregnancy normal.  Patient given zofran, tylenol, & maalox/lidocaine. Reports improvement in tylenol & nausea. Some improvement in abdominal pain but still feels "icky". Vital signs stable.   Patient concerned about recent diabetes diagnosis. Has not been scheduled with diabetes educator yet but does have a glucometer at home. Has appointment with ob/gyn on Thursday. Discussed when & how to check CBGs at home & goal ranges. Encouraged to document her blood sugar results so she can show her ob on Thursday.   Assessment: 1. Heartburn during pregnancy in third trimester   2. [redacted] weeks gestation of pregnancy   3. Diet controlled gestational diabetes mellitus (GDM) in third trimester     Plan: Discharge home in stable condition.  Discussed reasons to return to MAU Rx pepcid Monitor CBGs at home Keep appointment on Thursday   Allergies as of 09/15/2019   No Known Allergies     Medication List    STOP taking these medications   Butalbital-APAP-Caffeine 50-325-40 MG capsule   Comfort Fit Maternity Supp Lg Misc   fluticasone 50 MCG/ACT nasal spray Commonly known as: FLONASE     TAKE these medications   Accu-Chek Guide w/Device Kit 1 each by Does not apply route in the morning, at noon, in the evening, and at bedtime.   Accu-Chek Softclix Lancets lancets Use as instructed   aspirin EC 81 MG tablet Take 1 tablet (81 mg total) by mouth daily. Take after 12 weeks for prevention of preeclampsia later in pregnancy   Blood Pressure Kit Devi 1 kit by Does not apply route once a week. Check BP regularly and record readings into the [employer] App.  Large Cuff.   DX O90.0   cyclobenzaprine 10 MG tablet Commonly known as: FLEXERIL Take 1 tablet (10 mg total) by mouth 3 (three) times daily as needed for muscle spasms.   Doxylamine-Pyridoxine 10-10 MG Tbec Commonly known as: Diclegis Take 2 tablets at bedtime, may add 1 tablet at breakfast & 1 tablet at lunch if needed.   famotidine 20 MG tablet Commonly known as: PEPCID Take 1 tablet (20 mg total) by mouth 2 (two) times daily.   glucose blood test strip Use as instructed   Prenatal Vitamin 27-0.8 MG Tabs Take 1 tablet by mouth daily.       Jorje Guild, NP 09/15/2019 2:05 PM

## 2019-09-16 ENCOUNTER — Telehealth: Payer: Self-pay

## 2019-09-16 NOTE — Telephone Encounter (Signed)
Pt called and LVM stating she was supposed to receive a call by today at 4:30 and hadn't yet. I called patient back to clarify what she was referring to. No answer, no option for VM.

## 2019-09-17 ENCOUNTER — Telehealth: Payer: Self-pay

## 2019-09-17 NOTE — Telephone Encounter (Signed)
Pt called and reports she has not been called about scheduling a diabetic education class yet. She has been diagnosed with GDM.

## 2019-09-18 ENCOUNTER — Telehealth (INDEPENDENT_AMBULATORY_CARE_PROVIDER_SITE_OTHER): Payer: Medicaid Other | Admitting: Obstetrics and Gynecology

## 2019-09-18 ENCOUNTER — Encounter: Payer: Self-pay | Admitting: Obstetrics and Gynecology

## 2019-09-18 VITALS — BP 133/80 | HR 102

## 2019-09-18 DIAGNOSIS — O361990 Maternal care for other isoimmunization, unspecified trimester, not applicable or unspecified: Secondary | ICD-10-CM

## 2019-09-18 DIAGNOSIS — Z3A30 30 weeks gestation of pregnancy: Secondary | ICD-10-CM

## 2019-09-18 DIAGNOSIS — O24410 Gestational diabetes mellitus in pregnancy, diet controlled: Secondary | ICD-10-CM

## 2019-09-18 DIAGNOSIS — O361993 Maternal care for other isoimmunization, unspecified trimester, fetus 3: Secondary | ICD-10-CM

## 2019-09-18 DIAGNOSIS — Z3480 Encounter for supervision of other normal pregnancy, unspecified trimester: Secondary | ICD-10-CM

## 2019-09-18 DIAGNOSIS — O99213 Obesity complicating pregnancy, third trimester: Secondary | ICD-10-CM

## 2019-09-18 DIAGNOSIS — O99210 Obesity complicating pregnancy, unspecified trimester: Secondary | ICD-10-CM

## 2019-09-18 MED ORDER — METFORMIN HCL 500 MG PO TABS: 500.0000 mg | ORAL_TABLET | Freq: Every day | ORAL | 5 refills | Status: DC

## 2019-09-18 NOTE — Progress Notes (Signed)
   OBSTETRICS PRENATAL VIRTUAL VISIT ENCOUNTER NOTE  Provider location: [facility]   I connected with Martha Hall on 09/18/19 at 11:00 AM EDT by MyChart Video Encounter at home and verified that I am speaking with the correct person using two identifiers.   I discussed the limitations, risks, security and privacy concerns of performing an evaluation and management service virtually and the availability of in person appointments. I also discussed with the patient that there may be a patient responsible charge related to this service. The patient expressed understanding and agreed to proceed. Subjective:  Martha Hall is a 24 y.o. G1P0000 at [redacted]w[redacted]d being seen today for ongoing prenatal care.  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  Patient reports nausea, her morning sickess is coming back..  Contractions: Not present. Vag. Bleeding: None.  Movement: Present. Denies any leaking of fluid.   The following portions of the patient's history were reviewed and updated as appropriate: allergies, current medications, past family history, past medical history, past social history, past surgical history and problem list.   Objective:   Vitals:   09/18/19 0853  BP: 133/80  Pulse: (!) 102    Fetal Status:     Movement: Present     General:  Alert, oriented and cooperative. Patient is in no acute distress.  Respiratory: Normal respiratory effort, no problems with respiration noted  Mental Status: Normal mood and affect. Normal behavior. Normal judgment and thought  content.  Rest of physical exam deferred due to type of encounter  Imaging: No results found.  Assessment and Plan:  Pregnancy: G1P0000 at [redacted]w[redacted]d  1. Lewis isoimmunization during pregnancy, antepartum, single or unspecified fetus  2. Obesity in pregnancy  3. Diet controlled gestational diabetes mellitus (GDM) in third trimester Has not had education appt yet FG: 109, 150, 104 BG: 115, 123, 120, 115 Will start metformin 500 mg QHS  4. Supervision of other normal pregnancy, antepartum Nausea fairly well controlled with zofran and pepcid  Preterm labor symptoms and general obstetric precautions including but not limited to vaginal bleeding, contractions, leaking of fluid and fetal movement were reviewed in detail with the patient. I discussed the assessment and treatment plan with the patient. The patient was provided an opportunity to ask questions and all were answered. The patient agreed with the plan and demonstrated an understanding of the instructions. The patient was advised to call back or seek an in-person office evaluation/go to MAU at [facility] for any urgent or concerning symptoms. Please refer to After Visit Summary for other counseling recommendations.   I provided 16 minutes of face-to-face time during this encounter.  Return in about 2 weeks (around 10/02/2019) for high OB, virtual.  Future Appointments  Date Time Provider Department Center  09/18/2019 11:00 AM Conan Bowens, MD [facility] None  09/29/2019  3:15 PM [facility] NURSE [facility]  09/29/2019  3:15 PM [facility] US3 [facility]  10/30/2019  9:00 AM GI-315 MR 2 [id number] [facility] [address] [facility]  11/20/2019  8:15 AM Glean Salvo, NP [facility] None    Conan Bowens, MD [facility], [facility]

## 2019-09-18 NOTE — Progress Notes (Signed)
I connected with  Martha Hall on 09/18/19 by a video enabled telemedicine application and verified that I am speaking with the correct person using two identifiers.   I discussed the limitations of evaluation and management by telemedicine. The patient expressed understanding and agreed to proceed.   MyChart OB, c/o morning sickness.  BS  this AM is  115.  She is still waiting on an appointment for Nutrition and Diabetic Management.

## 2019-09-29 ENCOUNTER — Ambulatory Visit (HOSPITAL_BASED_OUTPATIENT_CLINIC_OR_DEPARTMENT_OTHER): Payer: Medicaid Other

## 2019-09-29 ENCOUNTER — Ambulatory Visit (HOSPITAL_COMMUNITY): Payer: Medicaid Other

## 2019-09-29 ENCOUNTER — Other Ambulatory Visit: Payer: Self-pay

## 2019-09-29 ENCOUNTER — Ambulatory Visit (HOSPITAL_COMMUNITY): Payer: Medicaid Other | Attending: Obstetrics and Gynecology | Admitting: *Deleted

## 2019-09-29 DIAGNOSIS — E669 Obesity, unspecified: Secondary | ICD-10-CM | POA: Diagnosis not present

## 2019-09-29 DIAGNOSIS — O24415 Gestational diabetes mellitus in pregnancy, controlled by oral hypoglycemic drugs: Secondary | ICD-10-CM | POA: Diagnosis not present

## 2019-09-29 DIAGNOSIS — O99210 Obesity complicating pregnancy, unspecified trimester: Secondary | ICD-10-CM

## 2019-09-29 DIAGNOSIS — D563 Thalassemia minor: Secondary | ICD-10-CM

## 2019-09-29 DIAGNOSIS — Z362 Encounter for other antenatal screening follow-up: Secondary | ICD-10-CM

## 2019-09-29 DIAGNOSIS — Z3A00 Weeks of gestation of pregnancy not specified: Secondary | ICD-10-CM | POA: Diagnosis not present

## 2019-09-29 DIAGNOSIS — O99213 Obesity complicating pregnancy, third trimester: Secondary | ICD-10-CM

## 2019-09-29 DIAGNOSIS — A5901 Trichomonal vulvovaginitis: Secondary | ICD-10-CM

## 2019-09-29 DIAGNOSIS — O361930 Maternal care for other isoimmunization, third trimester, not applicable or unspecified: Secondary | ICD-10-CM | POA: Diagnosis not present

## 2019-09-29 DIAGNOSIS — Z3A32 32 weeks gestation of pregnancy: Secondary | ICD-10-CM | POA: Diagnosis not present

## 2019-09-29 IMAGING — US US MFM OB FOLLOW-UP
1 series · 14 of 27 positions shown · non-contrast
Comparison: none

[Series 1: us mfm ob follow-up · 14 of 27 slices shown]
[im 1/27]
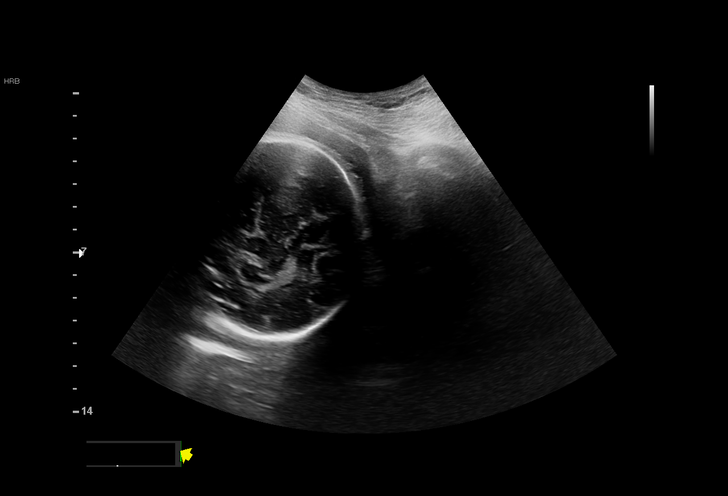
[im 3/27]
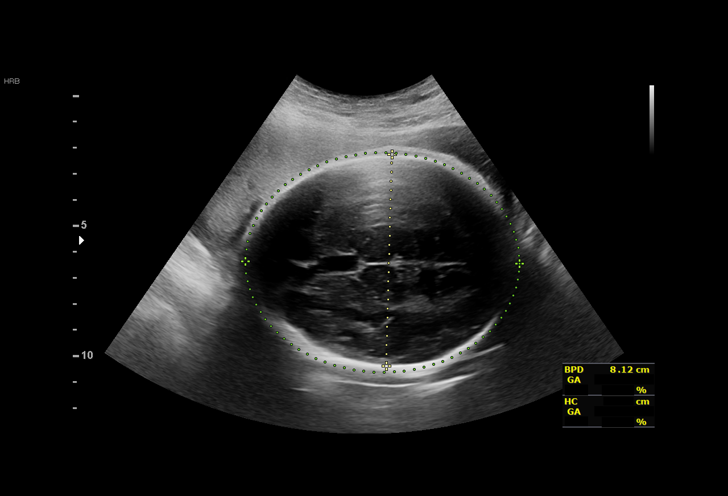
[im 5/27]
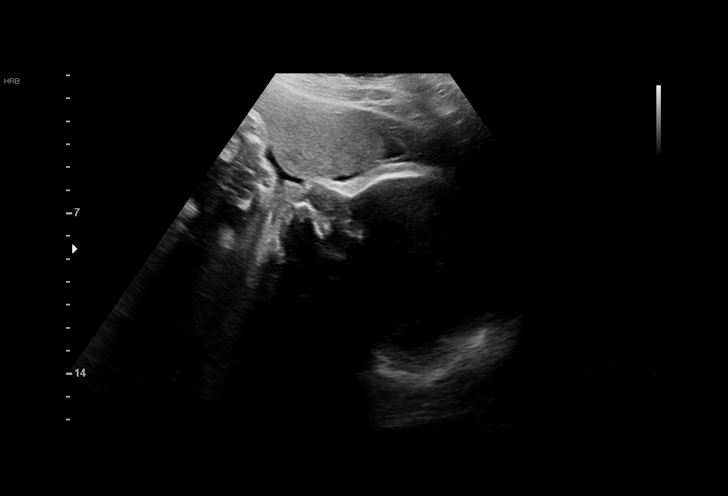
[im 7/27]
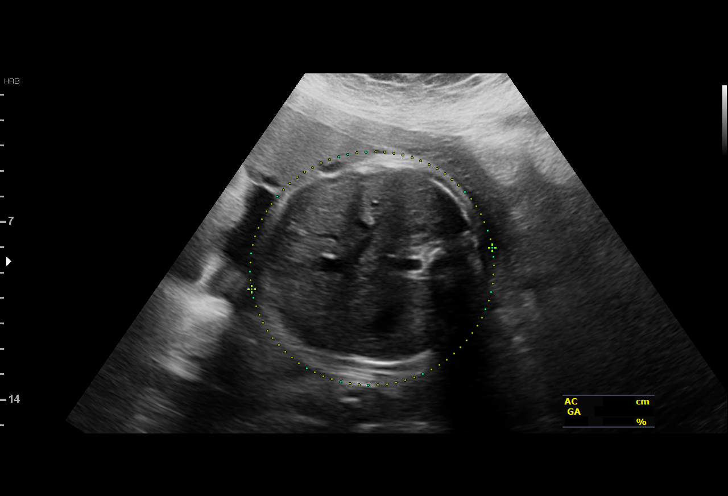
[im 9/27]
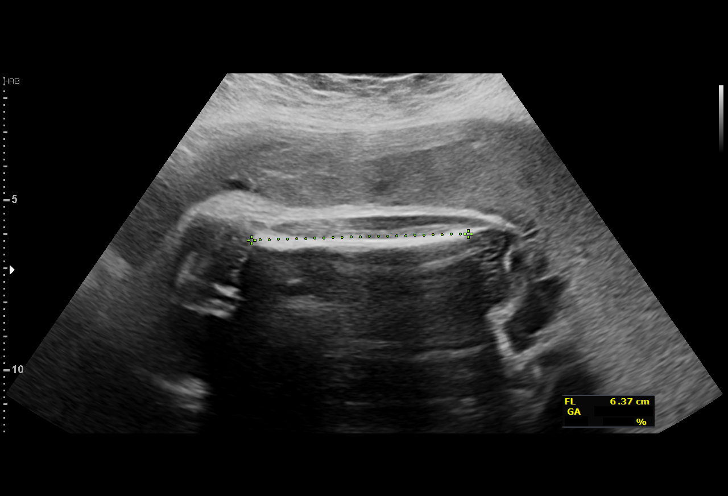
[im 11/27]
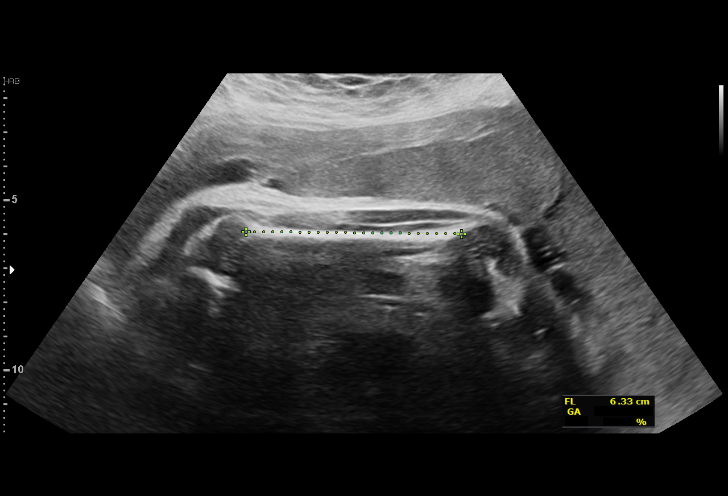
[im 13/27]
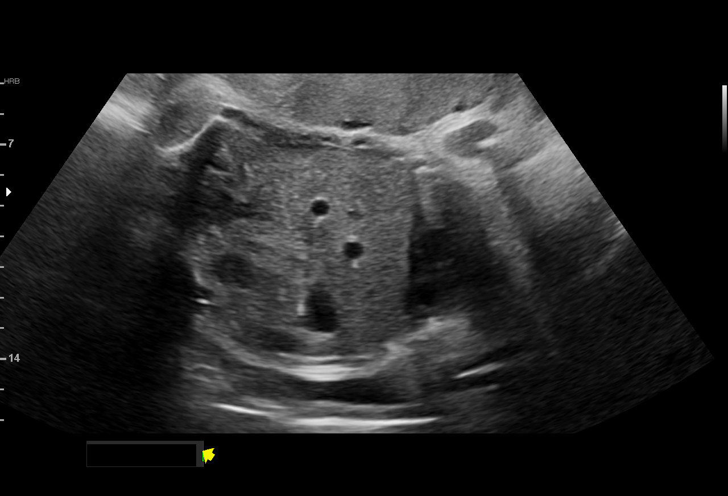
[im 15/27]
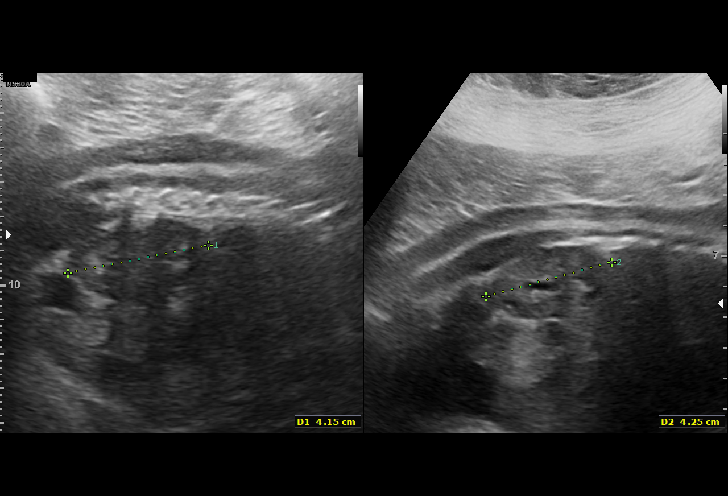
[im 17/27]
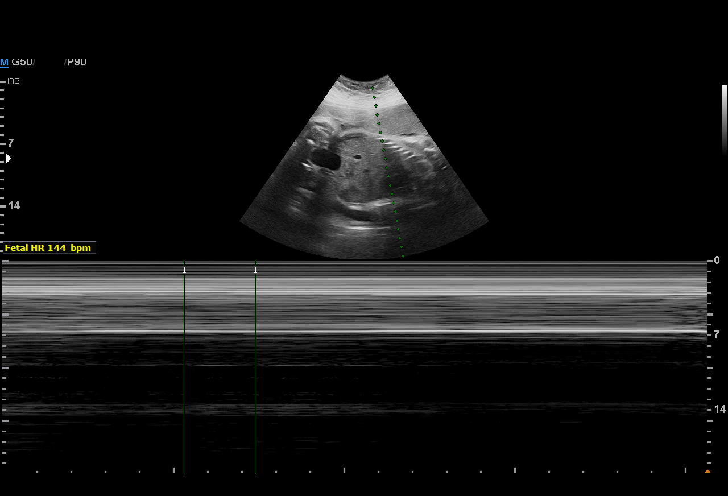
[im 19/27]
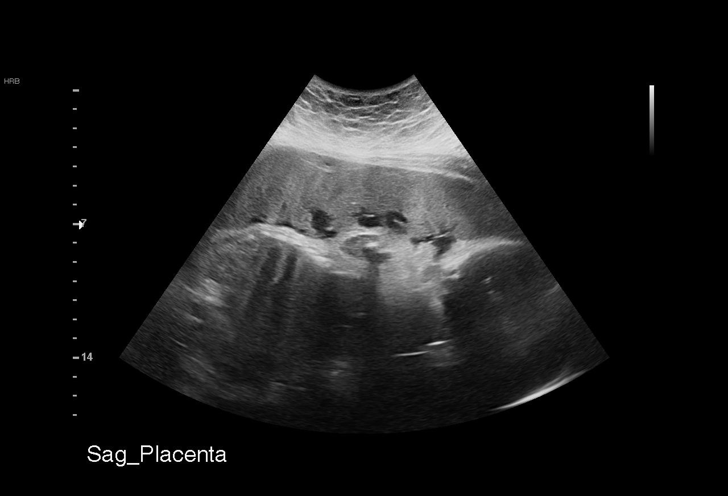
[im 21/27]
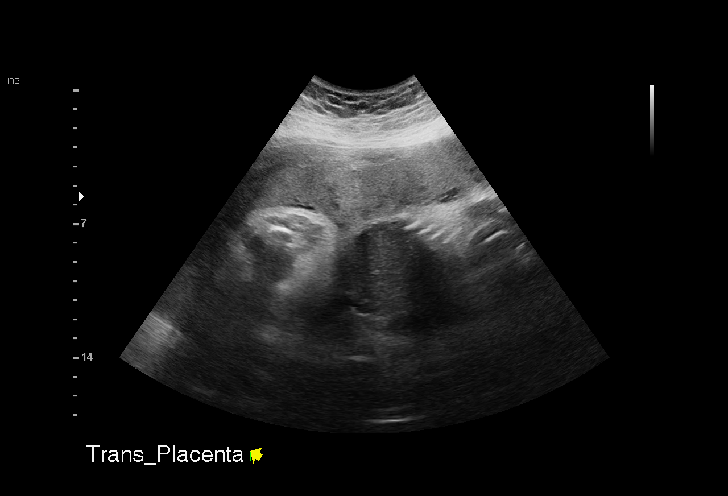
[im 23/27]
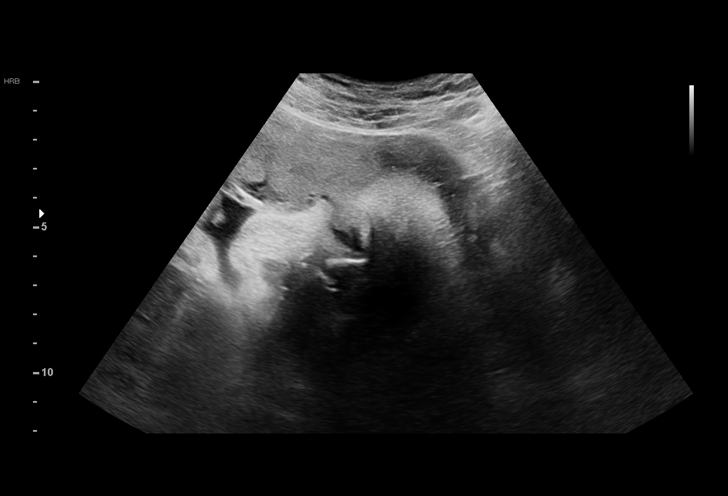
[im 25/27]
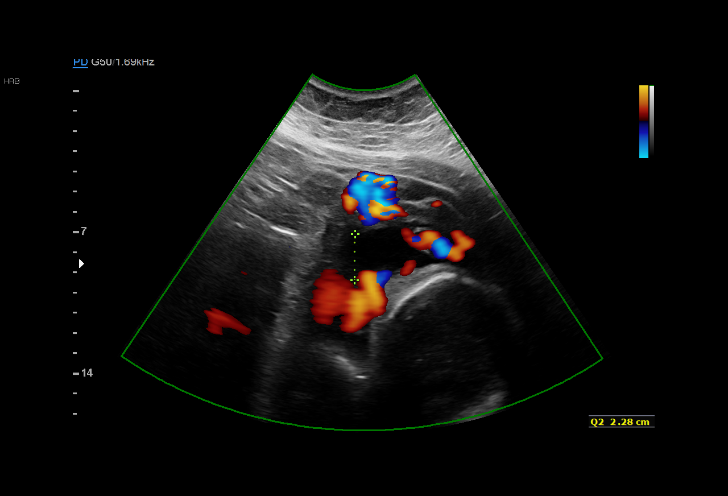
[im 27/27]
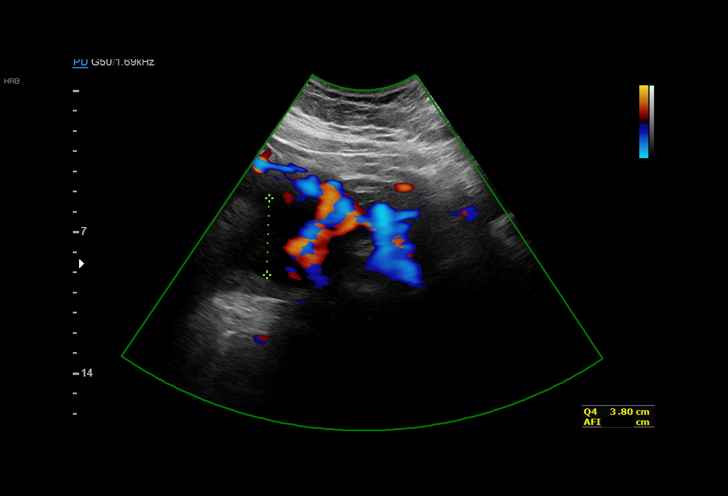

[14 of 27 positions shown; findings below may reference images not displayed]

Indications

 Gestational diabetes in pregnancy,             [O8]
 controlled by oral hypoglycemic drugs
 Isoimmunization - Rh                           [O8]
 Silent TIGER
 Obesity complicating pregnancy, second         [O8]
 trimester
 Antenatal follow-up for nonvisualized fetal    [O8]
 anatomy
 32 weeks gestation of pregnancy
Fetal Evaluation

 Num Of Fetuses:         1
 Fetal Heart Rate(bpm):  144
 Cardiac Activity:       Observed
 Presentation:           Cephalic
 Placenta:               Anterior
 P. Cord Insertion:      Previously Visualized

 Amniotic Fluid
 AFI FV:      Within normal limits

 AFI Sum(cm)     %Tile       Largest Pocket(cm)
 13              39

 RUQ(cm)       RLQ(cm)       LUQ(cm)        LLQ(cm)

Biometry
 BPD:      81.2  mm     G. Age:  32w 4d         56  %    CI:        74.76   %    70 - 86
                                                         FL/HC:      21.3   %    19.1 -
 HC:       298   mm     G. Age:  33w 0d         34  %    HC/AC:      1.01        0.96 -
 AC:      294.8  mm     G. Age:  33w 3d         84  %    FL/BPD:     78.2   %    71 - 87
 FL:       63.5  mm     G. Age:  32w 6d         56  %    FL/AC:      21.5   %    20 - 24

 Est. FW:    [O8]  gm    4 lb 11 oz      72  %
OB History

 Gravidity:    1
Gestational Age

 LMP:           32w 1d        Date:  [DATE]                 EDD:   [DATE]
 U/S Today:     33w 0d                                        EDD:   [DATE]
 Best:          32w 1d     Det. By:  LMP  ([DATE])          EDD:   [DATE]
Anatomy

 Cranium:               Appears normal         LVOT:                   Previously seen
 Cavum:                 Appears normal         Aortic Arch:            Previously seen
 Ventricles:            Appears normal         Ductal Arch:            Previously seen
 Choroid Plexus:        Previously seen        Diaphragm:              Appears normal
 Cerebellum:            Previously seen        Stomach:                Appears normal, left
                                                                       sided
 Posterior Fossa:       Previously seen        Abdomen:                Appears normal
 Nuchal Fold:           Previously seen        Abdominal Wall:         Previously seen
 Face:                  Orbits and profile     Cord Vessels:           Previously seen
                        previously seen
 Lips:                  Previously seen        Kidneys:                Appear normal
 Palate:                Previously seen        Bladder:                Appears normal
 Thoracic:              Appears normal         Spine:                  Previously seen
 Heart:                 Previously seen        Upper Extremities:      Previously seen
 RVOT:                  Previously seen        Lower Extremities:      Previously seen

 Other:  Heels previously visualized. Fetus appears to be a male.
Cervix Uterus Adnexa

 Cervix
 Not visualized (advanced GA >[O8])
Impression

 Patient with gestational diabetes returned for fetal growth
 assessment. She takes metformin and reports her blood
 glucose levels are better but still her fasting levels are greater
 than 95 mg/dL.
 Fetal growth is appropriate for gestational age. Amniotic fluid
 is normal and good fetal activity is seen.
 I discussed the importance of good blood glucose control to
 prevent adverse fetal and neonatal outcomes. If diabetes is
 not well-controlled with metformin, insulin my be necessary.
Recommendations

 -Continue weekly BPP till delivery.
                 TIGER

## 2019-09-30 ENCOUNTER — Other Ambulatory Visit: Payer: Self-pay | Admitting: *Deleted

## 2019-09-30 DIAGNOSIS — O24415 Gestational diabetes mellitus in pregnancy, controlled by oral hypoglycemic drugs: Secondary | ICD-10-CM

## 2019-10-01 ENCOUNTER — Encounter: Payer: Medicaid Other | Attending: Obstetrics and Gynecology | Admitting: Dietician

## 2019-10-01 ENCOUNTER — Other Ambulatory Visit: Payer: Self-pay

## 2019-10-01 ENCOUNTER — Other Ambulatory Visit: Payer: Self-pay | Admitting: *Deleted

## 2019-10-01 DIAGNOSIS — O24410 Gestational diabetes mellitus in pregnancy, diet controlled: Secondary | ICD-10-CM | POA: Diagnosis not present

## 2019-10-01 MED ORDER — GLUCOSE BLOOD VI STRP: ORAL_STRIP | 12 refills | Status: DC

## 2019-10-01 NOTE — Progress Notes (Signed)
New order for test strips to have specific testing instructions- otherwise will not be covered correctly with ins.

## 2019-10-02 ENCOUNTER — Telehealth (INDEPENDENT_AMBULATORY_CARE_PROVIDER_SITE_OTHER): Payer: Medicaid Other | Admitting: Obstetrics and Gynecology

## 2019-10-02 ENCOUNTER — Encounter: Payer: Self-pay | Admitting: Obstetrics and Gynecology

## 2019-10-02 DIAGNOSIS — O98313 Other infections with a predominantly sexual mode of transmission complicating pregnancy, third trimester: Secondary | ICD-10-CM

## 2019-10-02 DIAGNOSIS — O99213 Obesity complicating pregnancy, third trimester: Secondary | ICD-10-CM

## 2019-10-02 DIAGNOSIS — Z3480 Encounter for supervision of other normal pregnancy, unspecified trimester: Secondary | ICD-10-CM

## 2019-10-02 DIAGNOSIS — O24410 Gestational diabetes mellitus in pregnancy, diet controlled: Secondary | ICD-10-CM

## 2019-10-02 DIAGNOSIS — O288 Other abnormal findings on antenatal screening of mother: Secondary | ICD-10-CM

## 2019-10-02 DIAGNOSIS — D563 Thalassemia minor: Secondary | ICD-10-CM

## 2019-10-02 DIAGNOSIS — O361931 Maternal care for other isoimmunization, third trimester, fetus 1: Secondary | ICD-10-CM

## 2019-10-02 DIAGNOSIS — Z3A32 32 weeks gestation of pregnancy: Secondary | ICD-10-CM

## 2019-10-02 NOTE — Progress Notes (Signed)
OBSTETRICS PRENATAL VIRTUAL VISIT ENCOUNTER NOTE  Provider location: Center for [facility]   I connected with Dalores Kissner on 10/02/19 at 10:45 AM EDT by MyChart Video Encounter at home and verified that I am speaking with the correct person using two identifiers.   I discussed the limitations, risks, security and privacy concerns of performing an evaluation and management service virtually and the availability of in person appointments. I also discussed with the patient that there may be a patient responsible charge related to this service. The patient expressed understanding and agreed to proceed. Subjective:  Martha Hall is a 24 y.o. G1P0000 at [redacted]w[redacted]d being seen today for ongoing prenatal care.  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  Patient reports no complaints.  Contractions: Not present. Vag. Bleeding: None.  Movement: Present. Denies any leaking of fluid.   The following portions of the patient's history were reviewed and updated as appropriate: allergies, current medications, past family history, past medical history, past social history, past surgical history and problem list.   Objective:  There were no vitals filed for this visit.  Fetal Status:     Movement: Present     General:  Alert, oriented and cooperative. Patient is in no acute distress.  Respiratory: Normal respiratory effort, no problems with respiration noted  Mental Status: Normal mood and affect. Normal behavior. Normal judgment and thought content.  Rest of physical exam deferred due to  type of encounter  Imaging: [place] MFM OB FOLLOW UP  Result Date: 09/29/2019 ----------------------------------------------------------------------  OBSTETRICS REPORT                       (Signed Final 09/29/2019 04:36 pm) ---------------------------------------------------------------------- Patient Info  ID #:       [id number]                          D.O.B.:  1996-04-10 (24 yrs)  Name:       Martha Hall                 Visit Date: 09/29/2019 03:18 pm ---------------------------------------------------------------------- Performed By  Attending:        Noralee Space MD        Ref. Address:     [address]                                                             [address] [phone]  [place]                                                             [place]  Performed By:     Novella Rob        [facility]  Referred By:      Beatty ---------------------------------------------------------------------- Orders  #  Description                           Code        Ordered By  1  [place] MFM OB FOLLOW UP                   [phone]    [facility] ----------------------------------------------------------------------  #  Order #                     Accession #                Episode #  1  [id number]                   [id number]                 [id number] ---------------------------------------------------------------------- Indications  Gestational diabetes in pregnancy,             O24.415  controlled by oral hypoglycemic drugs  Isoimmunization - Rh                           O36.1990  Silent carrier alpha-thal  Obesity complicating pregnancy, second         O99.212  trimester  Antenatal follow-up for nonvisualized fetal    Z36.2  anatomy  [redacted] weeks gestation of pregnancy                Z3A.32  ---------------------------------------------------------------------- Fetal Evaluation  Num Of Fetuses:         1  Fetal Heart Rate(bpm):  144  Cardiac Activity:       Observed  Presentation:           Cephalic  Placenta:               Anterior  P. Cord Insertion:      Previously Visualized  Amniotic Fluid  AFI FV:      Within normal limits  AFI Sum(cm)     %Tile       Largest Pocket(cm)  13              39          4.3  RUQ(cm)       RLQ(cm)       LUQ(cm)        LLQ(cm)  4.3           3.8  2.3            2.6 ---------------------------------------------------------------------- Biometry  BPD:      81.2  mm     G. Age:  32w 4d         56  %    CI:        74.76   %    70 - 86                                                          FL/HC:      21.3   %    19.1 - 21.3  HC:       298   mm     G. Age:  33w 0d         34  %    HC/AC:      1.01        0.96 - 1.17  AC:      294.8  mm     G. Age:  33w 3d         84  %    FL/BPD:     78.2   %    71 - 87  FL:       63.5  mm     G. Age:  32w 6d         56  %    FL/AC:      21.5   %    20 - 24  Est. FW:    2131  gm    4 lb 11 oz      72  % ---------------------------------------------------------------------- OB History  Gravidity:    1 ---------------------------------------------------------------------- Gestational Age  LMP:           32w 1d        Date:  02/16/19                 EDD:   11/23/19  U/S Today:     33w 0d                                        EDD:   11/17/19  Best:          32w 1d     Det. By:  LMP  (02/16/19)          EDD:   11/23/19 ---------------------------------------------------------------------- Anatomy  Cranium:               Appears normal         LVOT:                   Previously seen  Cavum:                 Appears normal         Aortic Arch:            Previously seen  Ventricles:            Appears normal         Ductal Arch:            Previously seen  Choroid Plexus:        Previously seen  Diaphragm:              Appears  normal  Cerebellum:            Previously seen        Stomach:                Appears normal, left                                                                        sided  Posterior Fossa:       Previously seen        Abdomen:                Appears normal  Nuchal Fold:           Previously seen        Abdominal Wall:         Previously seen  Face:                  Orbits and profile     Cord Vessels:           Previously seen                         previously seen  Lips:                  Previously seen        Kidneys:                Appear normal  Palate:                Previously seen        Bladder:                Appears normal  Thoracic:              Appears normal         Spine:                  Previously seen  Heart:                 Previously seen        Upper Extremities:      Previously seen  RVOT:                  Previously seen        Lower Extremities:      Previously seen  Other:  Heels previously visualized. Fetus appears to be a female. ---------------------------------------------------------------------- Cervix Uterus Adnexa  Cervix  Not visualized (advanced GA >24wks) ---------------------------------------------------------------------- Impression  Patient with gestational diabetes returned for fetal growth  assessment. She takes metformin and reports her blood  glucose levels are better but still her fasting levels are greater  than 95 mg/dL.  Fetal growth is appropriate for gestational age. Amniotic fluid  is normal and good fetal activity is seen.  I discussed the importance of good blood glucose control to  prevent adverse fetal and neonatal outcomes. If diabetes is  not well-controlled with metformin, insulin my be necessary. ---------------------------------------------------------------------- Recommendations  -Continue weekly BPP till delivery. ----------------------------------------------------------------------  Noralee Spaceavi Shankar, MD Electronically Signed Final Report    09/29/2019 04:36 pm ----------------------------------------------------------------------   Assessment and Plan:  Pregnancy: G1P0000 at 1052w4d 1. Alpha thalassemia silent carrier  Stable  2. Lewis isoimmunization during pregnancy in third trimester, fetus 1 of multiple gestation     Stable   3. Gestational DM Reports CBG's in goal range with Metformin now Saw DM educator yesterday Continue with weekly antenatal testing  4. Supervision of pregnancy Stable  Preterm labor symptoms and general obstetric precautions including but not limited to vaginal bleeding, contractions, leaking of fluid and fetal movement were reviewed in detail with the patient. I discussed the assessment and treatment plan with the patient. The patient was provided an opportunity to ask questions and all were answered. The patient agreed with the plan and demonstrated an understanding of the instructions. The patient was advised to call back or seek an in-person office evaluation/[facility] for any urgent or concerning symptoms. Please refer to After Visit Summary for other counseling recommendations.   I provided 8 minutes of face-to-face time during this encounter.  Return in about 2 weeks (around 10/16/2019) for OB visit, face to face, MD only.  Future Appointments  Date Time Provider Department Center  10/08/2019  4:00 PM [facility]  10/08/2019  4:00 PM [facility]  10/14/2019  3:30 PM [facility]  10/14/2019  3:30 PM [facility]  10/21/2019  3:30 PM [facility]  10/21/2019  3:30 PM [facility] US5 [facility]  10/28/2019  3:30 PM [facility] NURSE [facility]  10/28/2019  3:30 PM [facility] US5 [facility]  10/30/2019  9:00 AM GI-315 MR 2 GI-315MRI GI-315 W. WE  11/20/2019  8:15 AM Glean SalvoSlack, Sarah J, NP [facility] None    Hermina StaggersMichael L Ondrea Dow, MD [facility], [facility]

## 2019-10-02 NOTE — Progress Notes (Signed)
Patient presents for Mychart ROB. Patient identified with 2 patient identifers. Patient unable to check bp at this time.Patient has no concerns today. She states that her fasting blood glucose this was 59 so she drank some juice. She states that within the last week the have been below 100.

## 2019-10-03 ENCOUNTER — Encounter: Payer: Self-pay | Admitting: Dietician

## 2019-10-03 NOTE — Progress Notes (Signed)
  Patient was seen on 10/01/2019 for Gestational Diabetes self-management class at [facility]. The following learning objectives were met by the patient during this course:   States the definition of Gestational Diabetes  States why dietary management is important in controlling blood glucose  Describes the effects each nutrient has on blood glucose levels  Demonstrates ability to create a balanced meal plan  Demonstrates carbohydrate counting   States when to check blood glucose levels  Demonstrates proper blood glucose monitoring techniques  States the effect of stress and exercise on blood glucose levels  States the importance of limiting caffeine and abstaining from alcohol and smoking  Blood glucose monitor given: none Patient has a meter and has been checking her blood glucose 3-4 times daily.   Blood glucose reading: 79 during class today  Patient instructed to monitor glucose levels: FBS: 60 - <90 1 hour: <140 2 hour: <120  *Patient received handouts:  Nutrition Diabetes and Pregnancy  Carbohydrate Counting List  Patient will be seen for follow-up as needed.

## 2019-10-07 ENCOUNTER — Ambulatory Visit: Payer: Medicaid Other

## 2019-10-08 ENCOUNTER — Ambulatory Visit: Payer: Medicaid Other | Attending: Obstetrics and Gynecology

## 2019-10-08 ENCOUNTER — Ambulatory Visit: Payer: Medicaid Other | Admitting: *Deleted

## 2019-10-08 ENCOUNTER — Other Ambulatory Visit: Payer: Self-pay

## 2019-10-08 DIAGNOSIS — O24415 Gestational diabetes mellitus in pregnancy, controlled by oral hypoglycemic drugs: Secondary | ICD-10-CM | POA: Insufficient documentation

## 2019-10-08 DIAGNOSIS — O98813 Other maternal infectious and parasitic diseases complicating pregnancy, third trimester: Secondary | ICD-10-CM | POA: Insufficient documentation

## 2019-10-08 DIAGNOSIS — A749 Chlamydial infection, unspecified: Secondary | ICD-10-CM | POA: Diagnosis not present

## 2019-10-08 DIAGNOSIS — O361931 Maternal care for other isoimmunization, third trimester, fetus 1: Secondary | ICD-10-CM | POA: Insufficient documentation

## 2019-10-08 DIAGNOSIS — Z3A33 33 weeks gestation of pregnancy: Secondary | ICD-10-CM | POA: Diagnosis not present

## 2019-10-08 DIAGNOSIS — A5901 Trichomonal vulvovaginitis: Secondary | ICD-10-CM | POA: Insufficient documentation

## 2019-10-08 DIAGNOSIS — O99210 Obesity complicating pregnancy, unspecified trimester: Secondary | ICD-10-CM

## 2019-10-08 DIAGNOSIS — D563 Thalassemia minor: Secondary | ICD-10-CM

## 2019-10-08 IMAGING — US US MFM FETAL BPP W/O NON-STRESS
1 series · 15 of 20 positions shown · non-contrast
Comparison: none

[Series 1: us mfm fetal bpp w/o non-stress · 20 acquisitions, 15 frames shown]
[im 1/20]
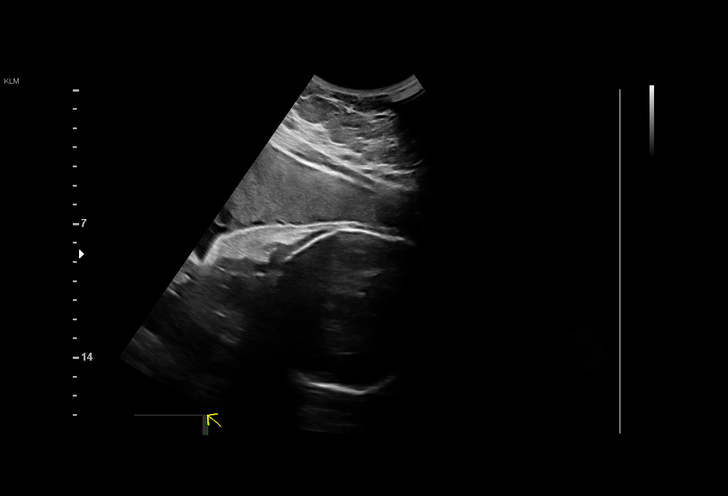
[im 3/20]
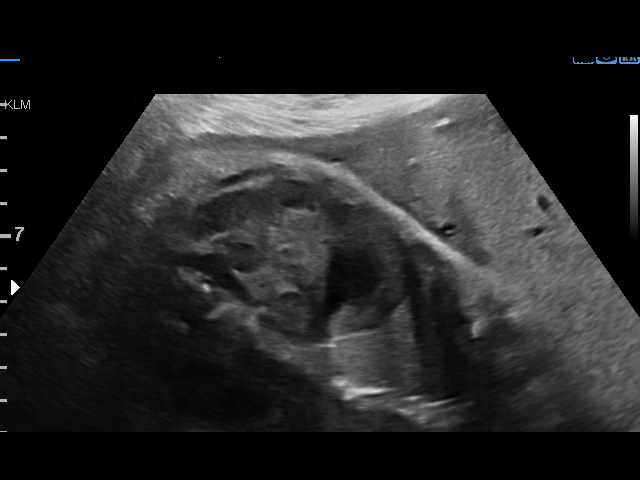
[im 4/20]
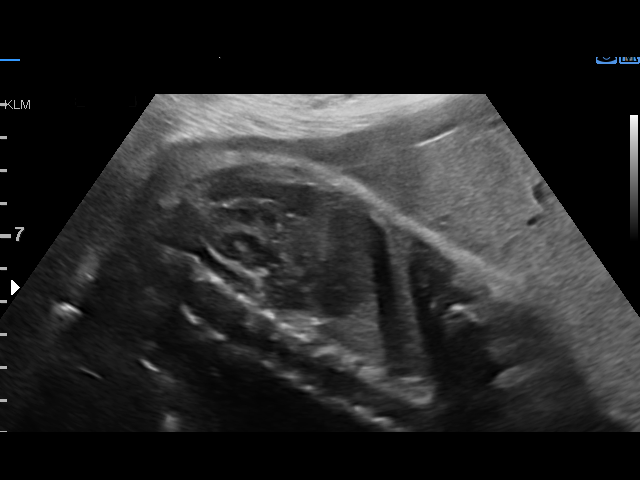
[im 5/20]
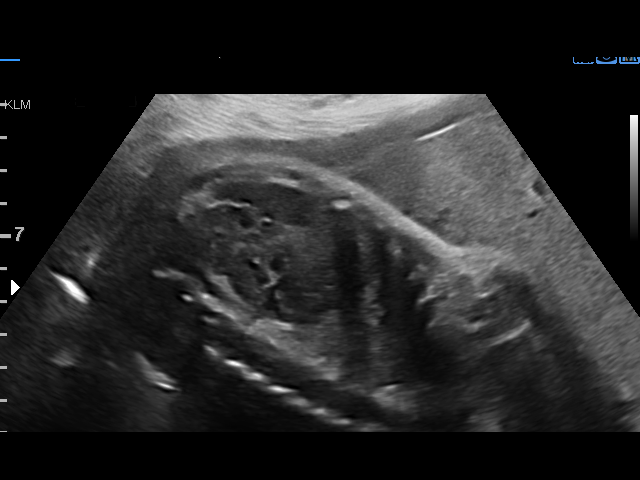
[im 7/20]
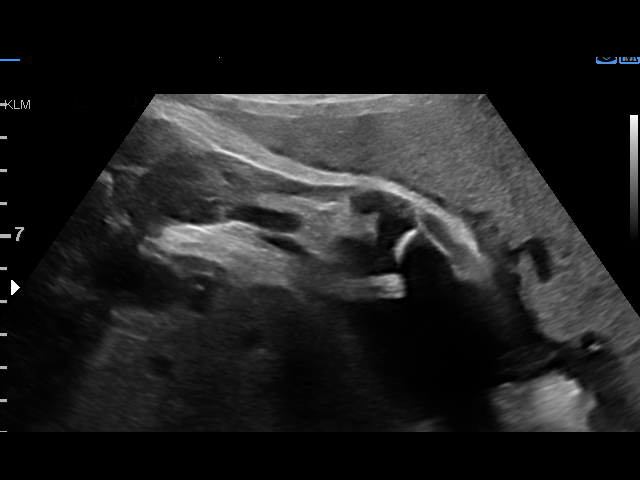
[im 8/20]
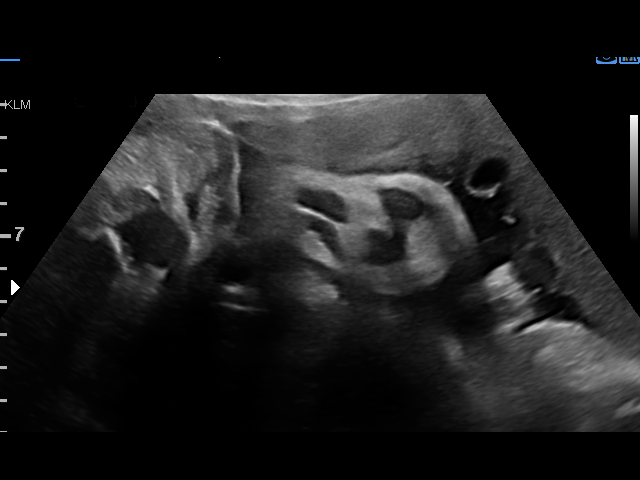
[im 9/20]
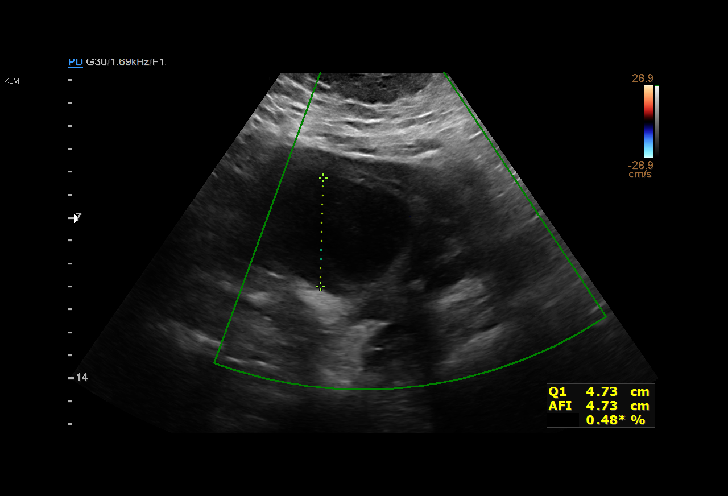
[im 11/20]
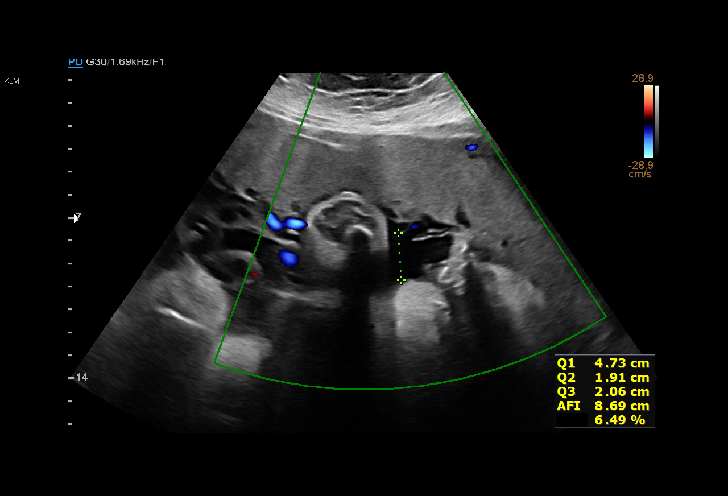
[im 12/20]
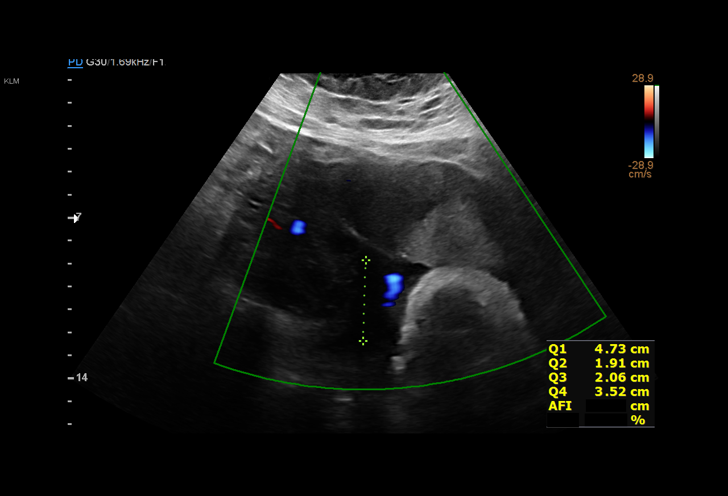
[im 13/20]
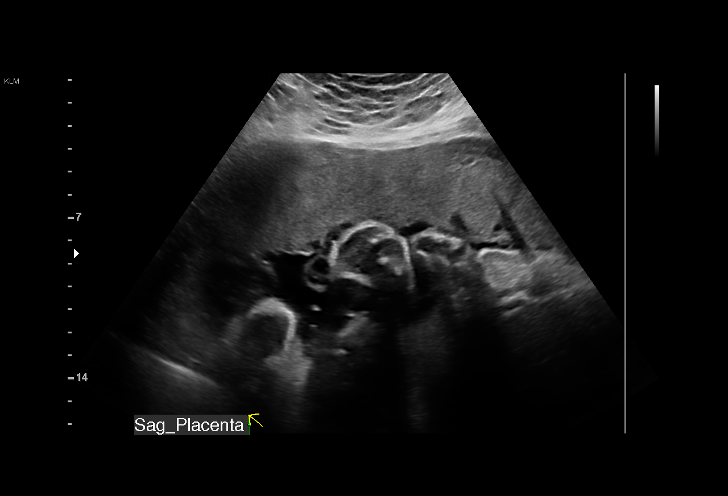
[im 15/20]
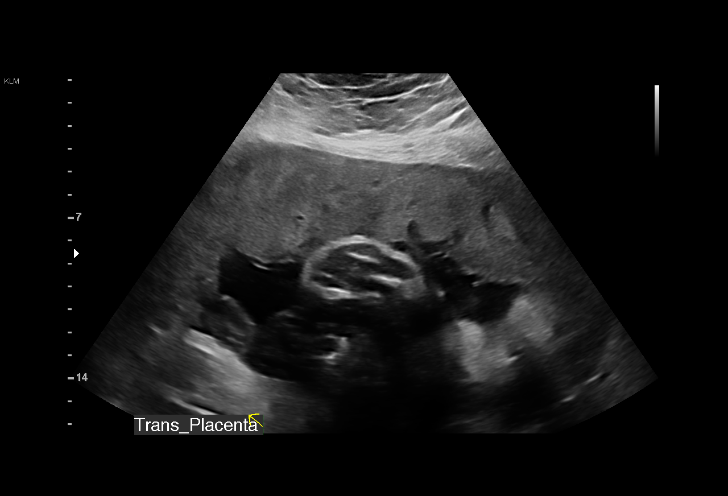
[im 16/20]
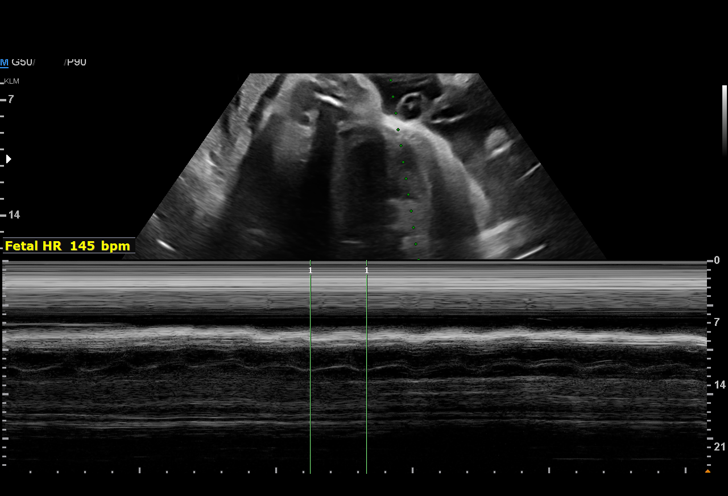
[im 17/20]
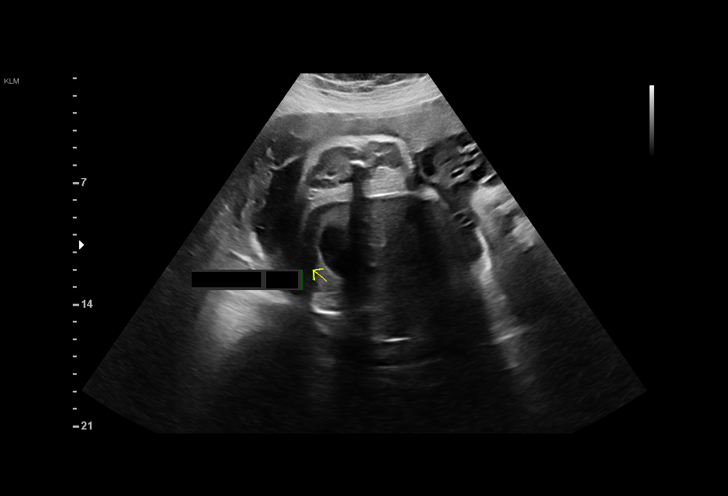
[im 19/20]
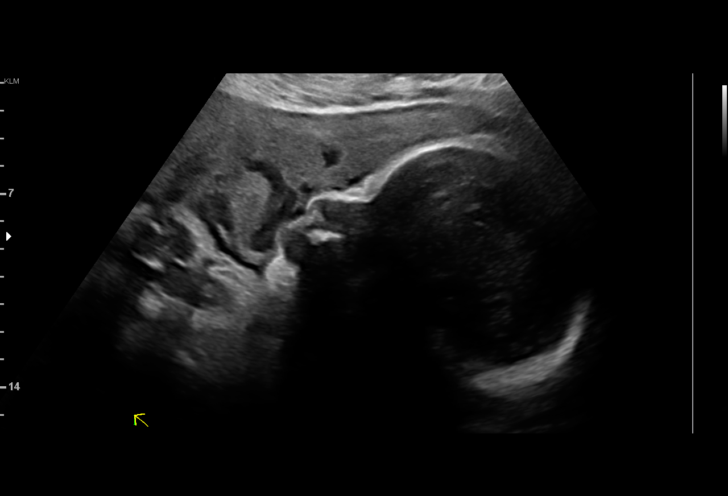
[im 20/20]
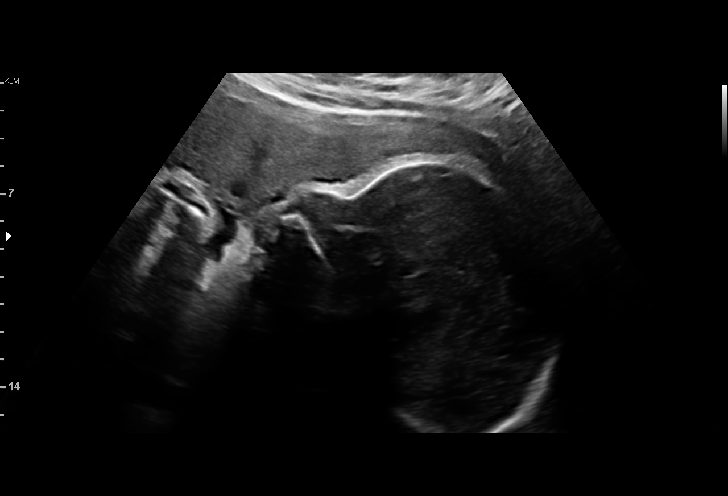

[15 of 20 positions shown; findings below may reference images not displayed]

Indications

 Gestational diabetes in pregnancy, controlled   [H1]
 by oral hypoglycemic drugs
 33 weeks gestation of pregnancy
 Isoimmunization - Rh                            [H1]
 Silent AYUMI
 Obesity complicating pregnancy, third trimester [H1]
Fetal Evaluation

 Num Of Fetuses:          1
 Fetal Heart Rate(bpm):   145
 Cardiac Activity:        Observed
 Presentation:            Cephalic
 Placenta:                Anterior

 Amniotic Fluid
 AFI FV:      Within normal limits

 AFI Sum(cm)     %Tile       Largest Pocket(cm)
 12.2            35

 RUQ(cm)       RLQ(cm)        LUQ(cm)        LLQ(cm)

Biophysical Evaluation

 Amniotic F.V:   Within normal limits        F. Tone:         Observed
 F. Movement:    Observed                    Score:           [DATE]
 F. Breathing:   Observed
OB History

 Gravidity:     1
Gestational Age

 LMP:            33w 3d       Date:  [DATE]                   EDD:  [DATE]
 Best:           33w 3d    Det. By:  LMP  ([DATE])            EDD:  [DATE]
Anatomy

 Stomach:                Appears normal, left   Bladder:                Appears normal
                         sided
Impression

 Antenatal testing performed given [H1]
 Biophysical profile [DATE]
 Good fetal movement and amniotic fluid observed
Recommendations

 Continue weekly testing
 Serial growth every 4 weeks.

## 2019-10-14 ENCOUNTER — Other Ambulatory Visit: Payer: Self-pay | Admitting: Obstetrics and Gynecology

## 2019-10-14 ENCOUNTER — Other Ambulatory Visit: Payer: Self-pay

## 2019-10-14 ENCOUNTER — Observation Stay (HOSPITAL_COMMUNITY)
Admission: AD | Admit: 2019-10-14 | Discharge: 2019-10-15 | Disposition: A | Discharge status: Home / Self Care | Payer: Medicaid Other | Attending: Family Medicine | Admitting: Family Medicine

## 2019-10-14 ENCOUNTER — Ambulatory Visit (HOSPITAL_BASED_OUTPATIENT_CLINIC_OR_DEPARTMENT_OTHER): Payer: Medicaid Other

## 2019-10-14 ENCOUNTER — Encounter (HOSPITAL_COMMUNITY): Payer: Self-pay | Admitting: Family Medicine

## 2019-10-14 ENCOUNTER — Ambulatory Visit: Payer: Medicaid Other | Admitting: *Deleted

## 2019-10-14 DIAGNOSIS — Z3A34 34 weeks gestation of pregnancy: Secondary | ICD-10-CM | POA: Insufficient documentation

## 2019-10-14 DIAGNOSIS — O24415 Gestational diabetes mellitus in pregnancy, controlled by oral hypoglycemic drugs: Secondary | ICD-10-CM

## 2019-10-14 DIAGNOSIS — O368190 Decreased fetal movements, unspecified trimester, not applicable or unspecified: Secondary | ICD-10-CM

## 2019-10-14 DIAGNOSIS — D563 Thalassemia minor: Secondary | ICD-10-CM

## 2019-10-14 DIAGNOSIS — O99213 Obesity complicating pregnancy, third trimester: Secondary | ICD-10-CM | POA: Diagnosis not present

## 2019-10-14 DIAGNOSIS — O99210 Obesity complicating pregnancy, unspecified trimester: Secondary | ICD-10-CM

## 2019-10-14 DIAGNOSIS — O98813 Other maternal infectious and parasitic diseases complicating pregnancy, third trimester: Secondary | ICD-10-CM | POA: Insufficient documentation

## 2019-10-14 DIAGNOSIS — Z7982 Long term (current) use of aspirin: Secondary | ICD-10-CM | POA: Diagnosis not present

## 2019-10-14 DIAGNOSIS — O361930 Maternal care for other isoimmunization, third trimester, not applicable or unspecified: Secondary | ICD-10-CM | POA: Diagnosis not present

## 2019-10-14 DIAGNOSIS — O368390 Maternal care for abnormalities of the fetal heart rate or rhythm, unspecified trimester, not applicable or unspecified: Principal | ICD-10-CM | POA: Diagnosis present

## 2019-10-14 DIAGNOSIS — A749 Chlamydial infection, unspecified: Secondary | ICD-10-CM | POA: Insufficient documentation

## 2019-10-14 DIAGNOSIS — E669 Obesity, unspecified: Secondary | ICD-10-CM | POA: Diagnosis not present

## 2019-10-14 DIAGNOSIS — O361931 Maternal care for other isoimmunization, third trimester, fetus 1: Secondary | ICD-10-CM | POA: Insufficient documentation

## 2019-10-14 DIAGNOSIS — O368130 Decreased fetal movements, third trimester, not applicable or unspecified: Secondary | ICD-10-CM | POA: Insufficient documentation

## 2019-10-14 DIAGNOSIS — Z20822 Contact with and (suspected) exposure to covid-19: Secondary | ICD-10-CM | POA: Insufficient documentation

## 2019-10-14 DIAGNOSIS — O24919 Unspecified diabetes mellitus in pregnancy, unspecified trimester: Secondary | ICD-10-CM

## 2019-10-14 DIAGNOSIS — A5901 Trichomonal vulvovaginitis: Secondary | ICD-10-CM

## 2019-10-14 LAB — SARS CORONAVIRUS 2 BY RT PCR (HOSPITAL ORDER, PERFORMED IN ~~LOC~~ HOSPITAL LAB): SARS Coronavirus 2: NEGATIVE

## 2019-10-14 LAB — TYPE AND SCREEN
ABO/RH(D): A POS
Antibody Screen: NEGATIVE

## 2019-10-14 LAB — GLUCOSE, CAPILLARY: Glucose-Capillary: 107 mg/dL — ABNORMAL HIGH (ref 70–99)

## 2019-10-14 IMAGING — US US MFM FETAL BPP W/O NON-STRESS
1 series · 15 of 21 positions shown · non-contrast
Comparison: none

[Series 1: us mfm fetal bpp w/o non-stress · 21 acquisitions, 15 frames shown]
[im 1/21]
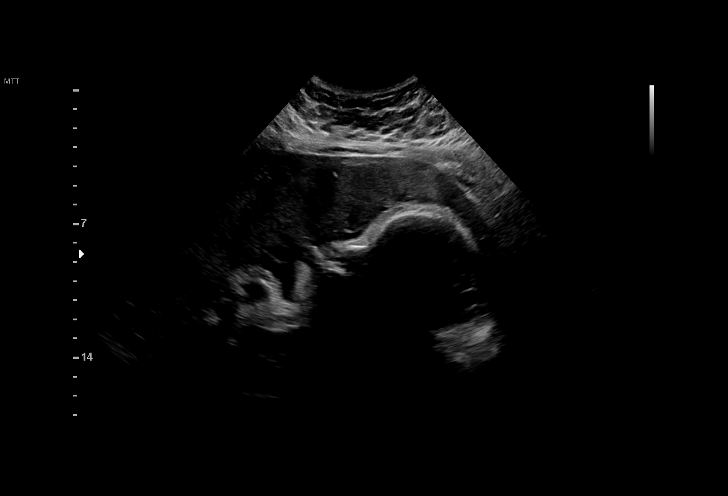
[im 3/21]
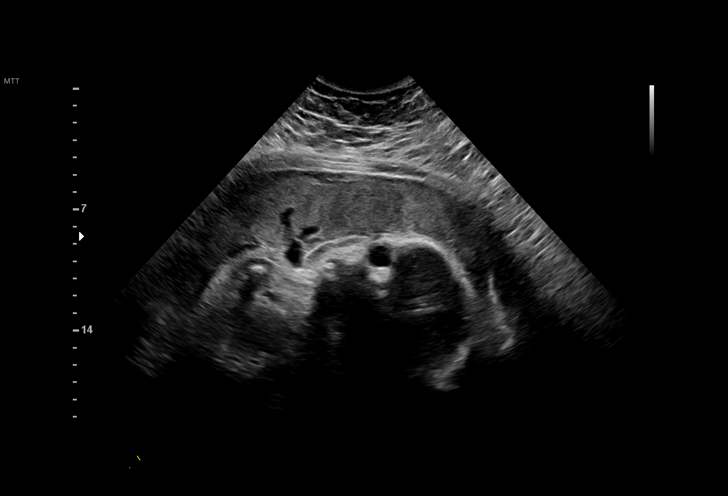
[im 4/21]
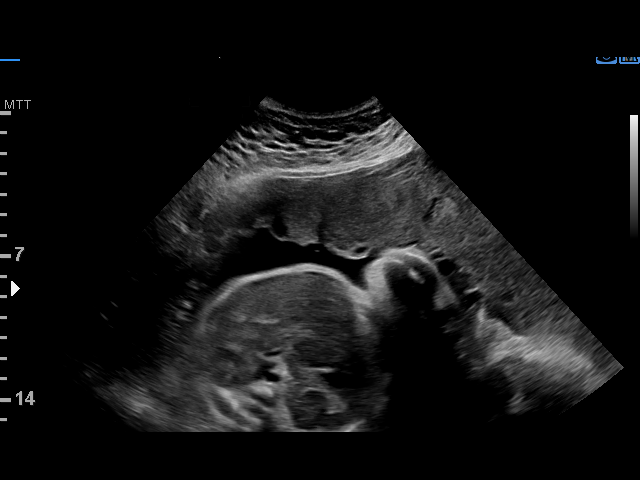
[im 5/21]
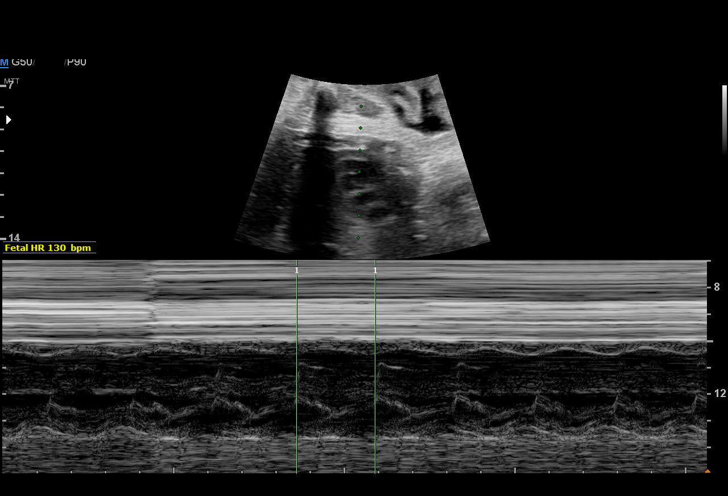
[im 7/21]
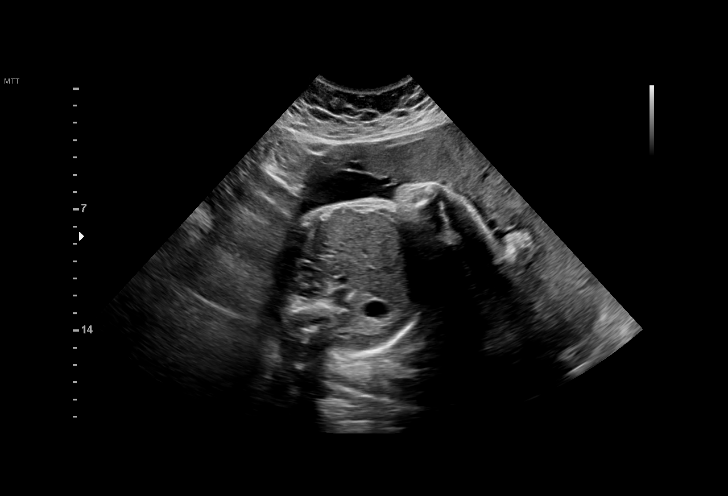
[im 8/21]
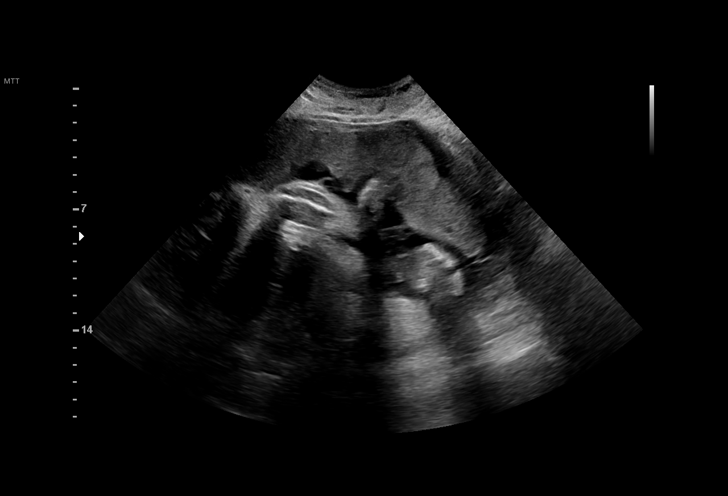
[im 10/21]
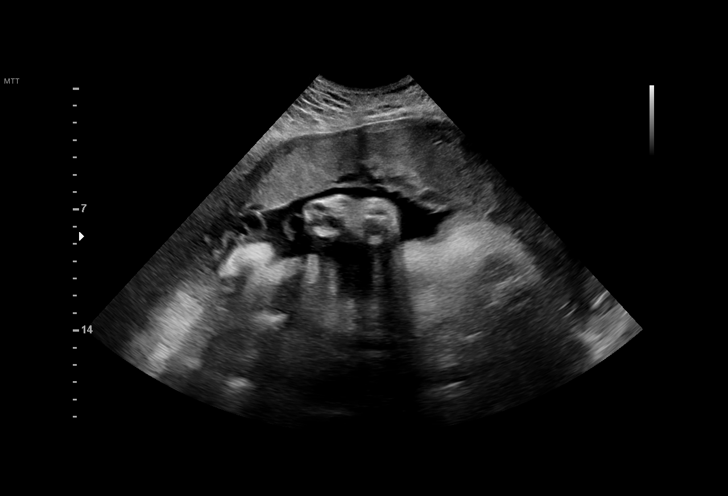
[im 11/21]
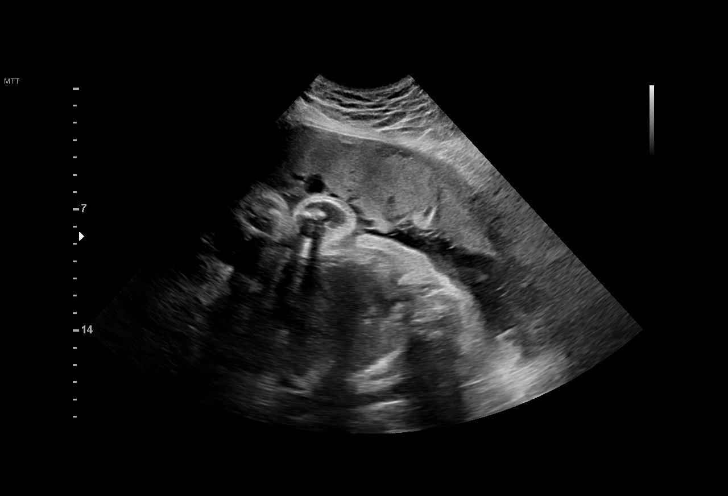
[im 12/21]
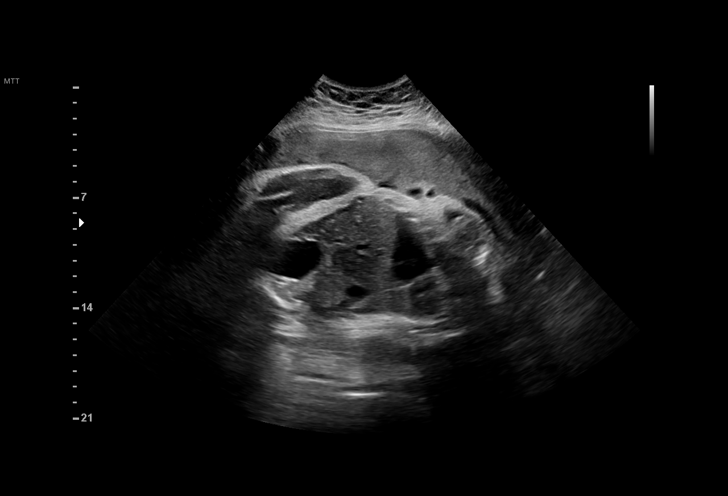
[im 14/21]
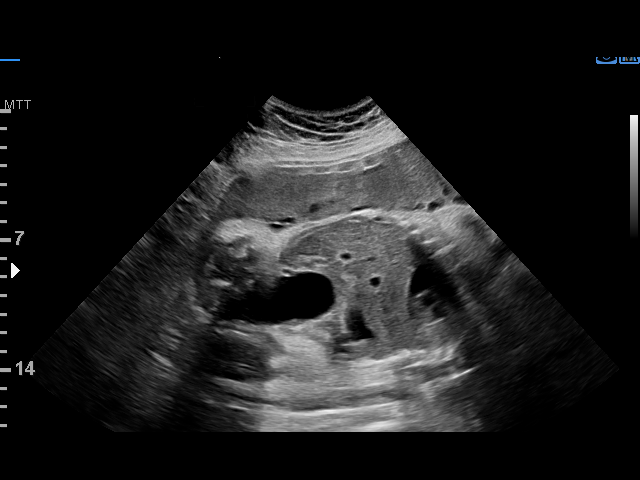
[im 15/21]
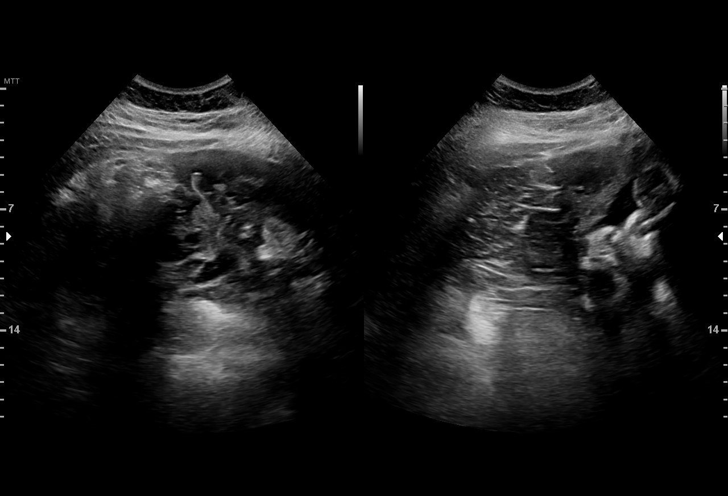
[im 17/21]
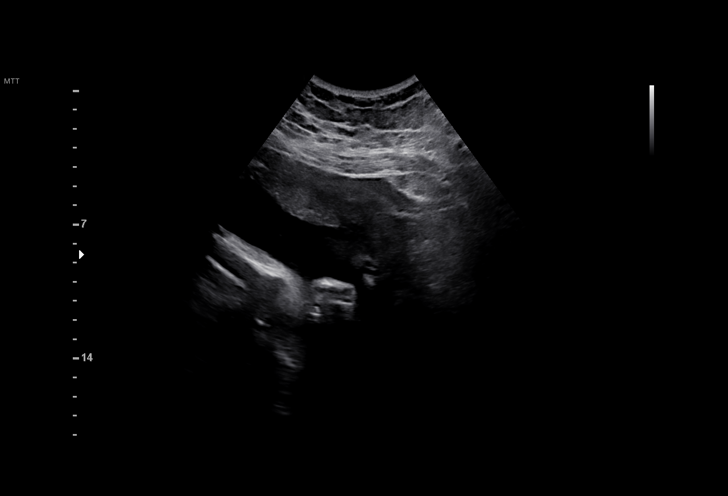
[im 18/21]
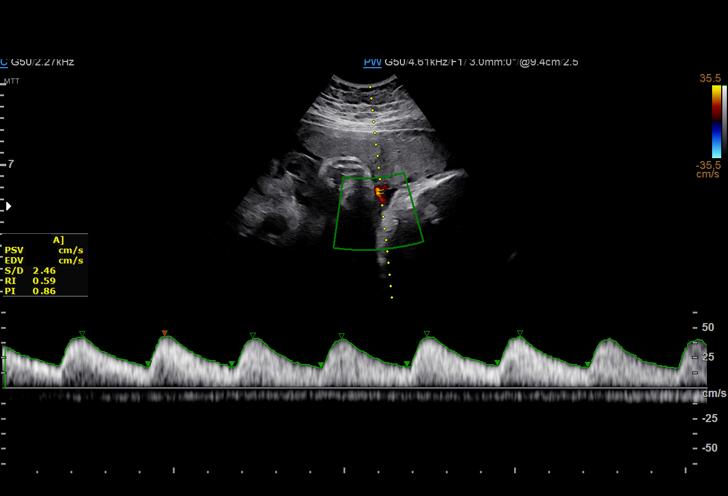
[im 19/21]
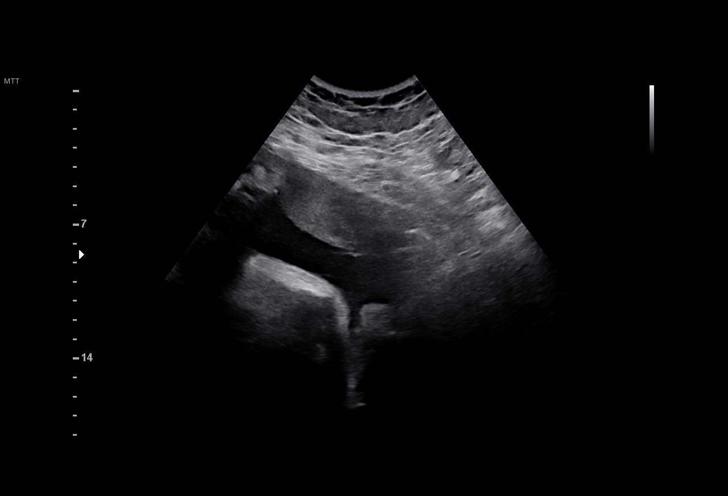
[im 21/21]
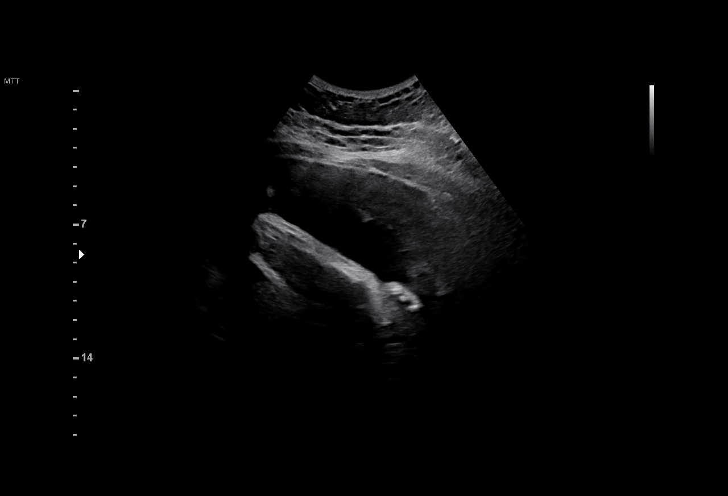

[15 of 21 positions shown; findings below may reference images not displayed]

Indications

 Gestational diabetes in pregnancy,             [6E]
 controlled by oral hypoglycemic drugs
 34 weeks gestation of pregnancy
 Obesity complicating pregnancy, third          [6E]
 trimester
 Isoimmunization - Rh                           [6E]
 Silent SHI
Fetal Evaluation

 Num Of Fetuses:         1
 Fetal Heart Rate(bpm):  110
 Cardiac Activity:       Observed
 Presentation:           Cephalic
 Placenta:               Anterior
 P. Cord Insertion:      Previously Visualized

 Amniotic Fluid
 AFI FV:      Within normal limits

 AFI Sum(cm)     %Tile       Largest Pocket(cm)
 10.8            25

 RUQ(cm)       RLQ(cm)       LUQ(cm)        LLQ(cm)
 1.9           2.9           3.1            3
Biophysical Evaluation

 Amniotic F.V:   Pocket => 2 cm             F. Tone:        Not Observed
 F. Movement:    Observed                   Score:          [DATE]
 F. Breathing:   Not Observed
OB History

 Gravidity:    1
Gestational Age

 LMP:           34w 2d        Date:  [DATE]                 EDD:   [DATE]
 Best:          34w 2d     Det. By:  LMP  ([DATE])          EDD:   [DATE]
Cervix Uterus Adnexa

 Cervix
 Not visualized (advanced GA >[6E])

 Uterus
 No abnormality visualized.

 Right Ovary
 Not visualized.

 Left Ovary
 Not visualized.

 Cul De Sac
 No free fluid seen.

 Adnexa
 No abnormality visualized.
Comments

 This patient was seen for a biophysical profile due to
 gestational diabetes that is currently treated with Metformin.
 The patient reports that she feels very nauseous today.
 A biophysical profile performed today was [DATE].  She
 received a -2 for absent fetal breathing movements and
 another -2 for absent fetal tone.
 There was normal amniotic fluid noted on today's ultrasound
 exam.
 Due to the biophysical profile [DATE], the patient was
 sent immediately following today's ultrasound exam to the
 hospital for prolonged monitoring.
 Another biophysical profile was scheduled in 1 week should
 she be discharged home from the hospital.

## 2019-10-14 MED ORDER — METFORMIN HCL 500 MG PO TABS
500.0000 mg | ORAL_TABLET | Freq: Every day | ORAL | Status: DC
  Administered 2019-10-14: 500 mg via ORAL
  Filled 2019-10-14: qty 1

## 2019-10-14 MED ORDER — PRENATAL MULTIVITAMIN CH
1.0000 | ORAL_TABLET | Freq: Every day | ORAL | Status: DC
  Administered 2019-10-15: 1 via ORAL
  Filled 2019-10-14: qty 1

## 2019-10-14 MED ORDER — FAMOTIDINE 20 MG PO TABS: 20.0000 mg | ORAL_TABLET | Freq: Two times a day (BID) | ORAL | 0 refills | Status: DC

## 2019-10-14 MED ORDER — DOCUSATE SODIUM 100 MG PO CAPS
100.0000 mg | ORAL_CAPSULE | Freq: Every day | ORAL | Status: DC
  Administered 2019-10-15: 100 mg via ORAL
  Filled 2019-10-14: qty 1

## 2019-10-14 MED ORDER — FAMOTIDINE 20 MG PO TABS
20.0000 mg | ORAL_TABLET | Freq: Two times a day (BID) | ORAL | Status: DC
  Administered 2019-10-14 – 2019-10-15 (×2): 20 mg via ORAL
  Filled 2019-10-14 (×2): qty 1

## 2019-10-14 MED ORDER — CALCIUM CARBONATE ANTACID 500 MG PO CHEW: 2.0000 | CHEWABLE_TABLET | ORAL | Status: DC | PRN

## 2019-10-14 MED ORDER — CYCLOBENZAPRINE HCL 10 MG PO TABS: 10.0000 mg | ORAL_TABLET | Freq: Three times a day (TID) | ORAL | Status: DC | PRN

## 2019-10-14 MED ORDER — ZOLPIDEM TARTRATE 5 MG PO TABS: 5.0000 mg | ORAL_TABLET | Freq: Every evening | ORAL | Status: DC | PRN

## 2019-10-14 MED ORDER — ACETAMINOPHEN 325 MG PO TABS
650.0000 mg | ORAL_TABLET | ORAL | Status: DC | PRN
  Administered 2019-10-15: 650 mg via ORAL
  Filled 2019-10-14: qty 2

## 2019-10-14 MED ORDER — ASPIRIN EC 81 MG PO TBEC
81.0000 mg | DELAYED_RELEASE_TABLET | Freq: Every day | ORAL | Status: DC
  Administered 2019-10-15: 81 mg via ORAL
  Filled 2019-10-14: qty 1

## 2019-10-14 MED ORDER — PRENATAL MULTIVITAMIN CH: 1.0000 | ORAL_TABLET | Freq: Every day | ORAL | Status: DC

## 2019-10-14 NOTE — MAU Provider Note (Signed)
History     CSN: [id number]  Arrival date and time: 10/14/19 1712   First Provider Initiated Contact with Patient 10/14/19 1804      Chief Complaint  Patient presents with  . Non-stress Test   Martha Hall is a 24 y.o. G1P0000 at 12w2dwho presents today from MFM for monitoring due to 4/8 BPP. She denies any contractions, VB or  LOF. She reports normal fetal movement. Patient states that prior to her ultrasound appt she wasn't feeling well. She had some nausea and vomiting. She rpeorts that she is taking her diabetes medication as prescribed, and her blood sugars have all been normal.    OB History    Gravida  1   Para      Term      Preterm      AB  0   Living  0     SAB  0   TAB      Ectopic      Multiple      Live Births              Past Medical History:  Diagnosis Date  . GDM (gestational diabetes mellitus) 09/2019  . Gestational diabetes   . Hydrocephalus (HCC)    hx of  . Infection    UTI    Past Surgical History:  Procedure Laterality Date  . BRAIN SURGERY  01999/03/23  stint placed when born    Family History  Problem Relation Age of Onset  . Healthy Mother   . Healthy Father     Social History   Tobacco Use  . Smoking status: Never Smoker  . Smokeless tobacco: Never Used  Substance Use Topics  . Alcohol use: Never  . Drug use: Never    Allergies: No Known Allergies  Medications Prior to Admission  Medication Sig Dispense Refill Last Dose  . cyclobenzaprine (FLEXERIL) 10 MG tablet Take 1 tablet (10 mg total) by mouth 3 (three) times daily as needed for muscle spasms. 30 tablet 2 Past Month at Unknown time  . famotidine (PEPCID) 20 MG tablet Take 1 tablet (20 mg total) by mouth 2 (two) times daily. 60 tablet 0 10/14/2019 at Unknown time  . metFORMIN (GLUCOPHAGE) 500 MG tablet Take 1 tablet (500 mg total) by mouth at bedtime. 30 tablet 5 10/13/2019 at Unknown time  . Prenatal Vit-Fe Fumarate-FA (PRENATAL VITAMIN) 27-0.8 MG TABS  Take 1 tablet by mouth daily. 30 tablet 11 10/14/2019 at Unknown time  . Accu-Chek Softclix Lancets lancets Use as instructed 100 each 12   . aspirin EC 81 MG tablet Take 1 tablet (81 mg total) by mouth daily. Take after 12 weeks for prevention of preeclampsia later in pregnancy 30 tablet 3   . Blood Glucose Monitoring Suppl (ACCU-CHEK GUIDE) w/Device KIT 1 each by Does not apply route in the morning, at noon, in the evening, and at bedtime. 1 kit 0   . Blood Pressure Monitoring (BLOOD PRESSURE KIT) DEVI 1 kit by Does not apply route once a week. Check BP regularly and record readings into the [employer] App.  Large Cuff.  DX O90.0 1 each 0   . Doxylamine-Pyridoxine (DICLEGIS) 10-10 MG TBEC Take 2 tablets at bedtime, may add 1 tablet at breakfast & 1 tablet at lunch if needed. (Patient not taking: Reported on 10/02/2019) 60 tablet 0   . glucose blood test strip Use 1 test strip to check glucose 4 times daily. 100 each 12  Review of Systems Physical Exam   Blood pressure 121/73, pulse (!) 103, temperature 98.2 F (36.8 C), temperature source Oral, resp. rate 18, height _0  (1.549 m), weight 107.5 kg, last menstrual period 02/16/2019, SpO2 100 %.  Physical Exam  Nursing note and vitals reviewed. Constitutional: She is oriented to person, place, and time. She appears well-developed and well-nourished.  HENT:  Head: Normocephalic.  Cardiovascular: Normal rate.  Respiratory: Effort normal.  GI: Soft. There is no abdominal tenderness.  Neurological: She is alert and oriented to person, place, and time.  Skin: Skin is warm and dry.     NST:  Baseline: 125 Variability: moderate Accels: 15x15 Decels: none Toco: irreg Reactive/Appropriate for GA   MAU Course  Procedures  MDM Patient reports that she has not felt any fetal movement since being here.  DW. Dr. Nehemiah Settle will obs on [facility] and repeat BPP in the am.  Assessment and Plan  23 y.o. G1P0000 at [redacted]w[redacted]d Decreased fetal  movement BPP 4/8 Obs on [facility]SC and repeat BPP in am   HMarcille BuffyDNP, CNM  10/14/19  7:12 PM

## 2019-10-14 NOTE — MAU Note (Addendum)
Pt sent from MFM d/t BPP 4/8. Pt reports no fetal movement. Denies cntrx, just vag pressure. Denies VB or LOF>

## 2019-10-15 ENCOUNTER — Observation Stay (HOSPITAL_BASED_OUTPATIENT_CLINIC_OR_DEPARTMENT_OTHER): Payer: Medicaid Other

## 2019-10-15 DIAGNOSIS — O368330 Maternal care for abnormalities of the fetal heart rate or rhythm, third trimester, not applicable or unspecified: Secondary | ICD-10-CM | POA: Diagnosis not present

## 2019-10-15 DIAGNOSIS — Z3A34 34 weeks gestation of pregnancy: Secondary | ICD-10-CM

## 2019-10-15 DIAGNOSIS — O24415 Gestational diabetes mellitus in pregnancy, controlled by oral hypoglycemic drugs: Secondary | ICD-10-CM

## 2019-10-15 DIAGNOSIS — O361930 Maternal care for other isoimmunization, third trimester, not applicable or unspecified: Secondary | ICD-10-CM

## 2019-10-15 DIAGNOSIS — E669 Obesity, unspecified: Secondary | ICD-10-CM

## 2019-10-15 DIAGNOSIS — O99213 Obesity complicating pregnancy, third trimester: Secondary | ICD-10-CM

## 2019-10-15 LAB — GLUCOSE, CAPILLARY
Glucose-Capillary: 101 mg/dL — ABNORMAL HIGH (ref 70–99)
Glucose-Capillary: 78 mg/dL (ref 70–99)

## 2019-10-15 IMAGING — US US MFM FETAL BPP W/O NON-STRESS
1 series · 15 of 18 positions shown · non-contrast
Comparison: none

[Series 1: us mfm fetal bpp w/o non-stress · 18 acquisitions, 15 frames shown]
[im 1/18]
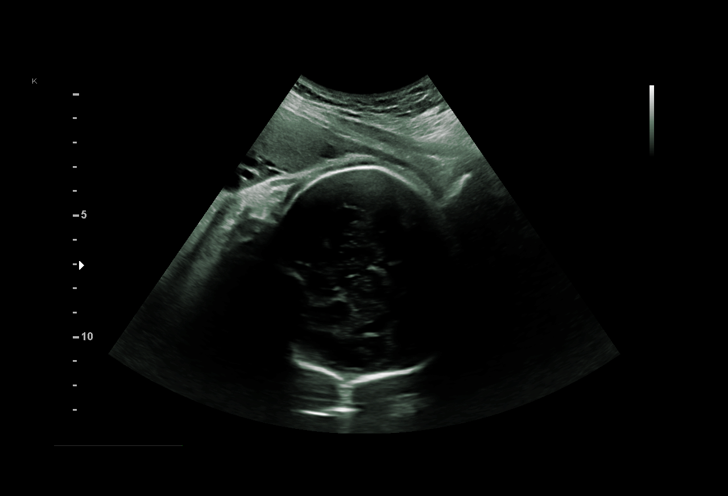
[im 2/18]
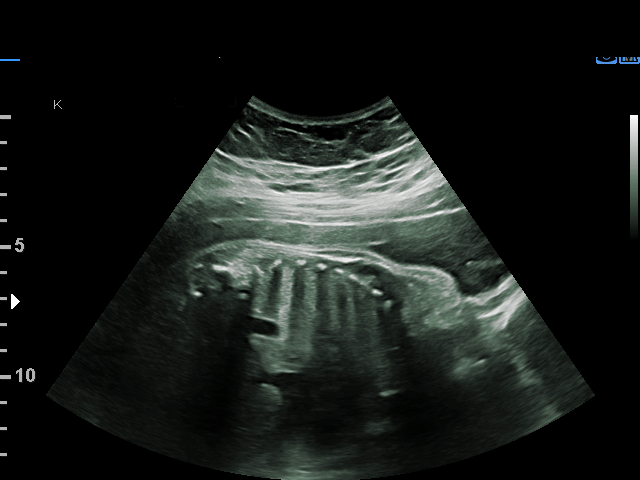
[im 4/18]
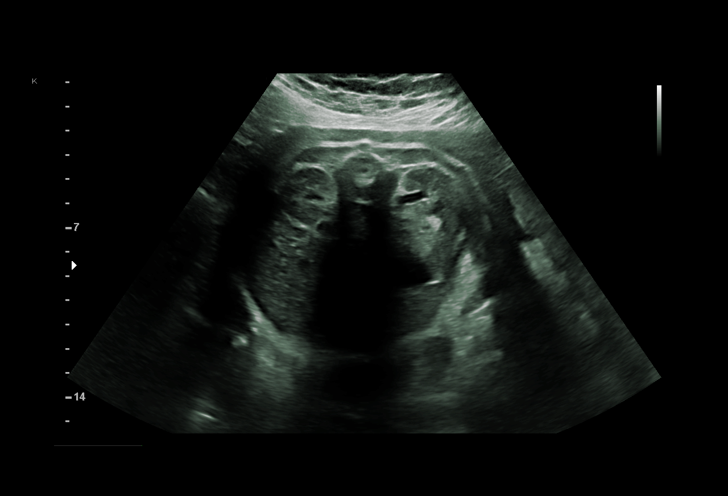
[im 5/18]
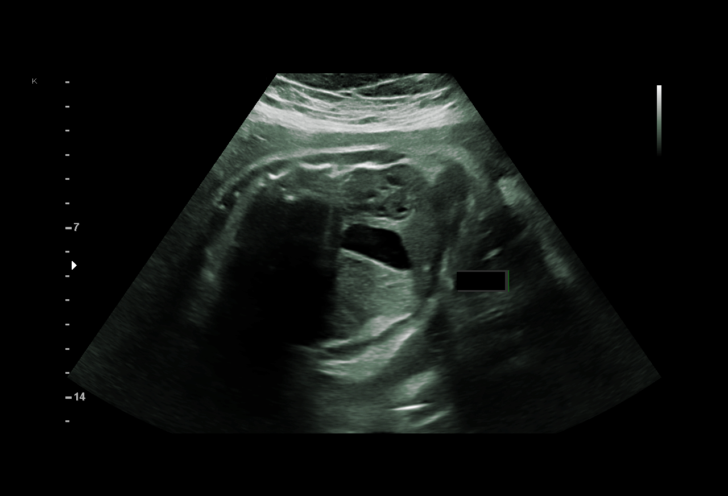
[im 6/18]
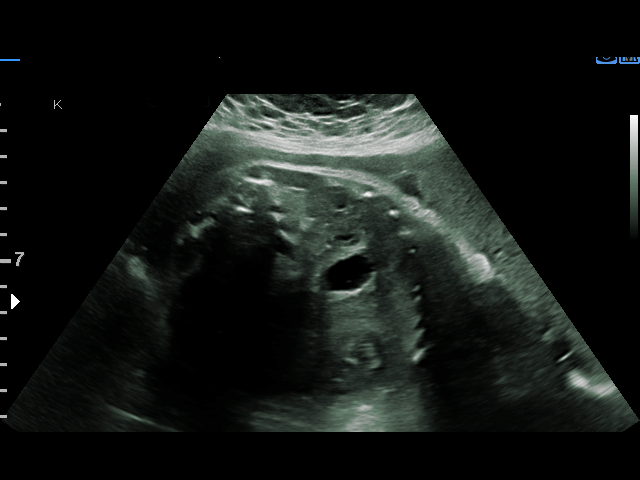
[im 7/18]
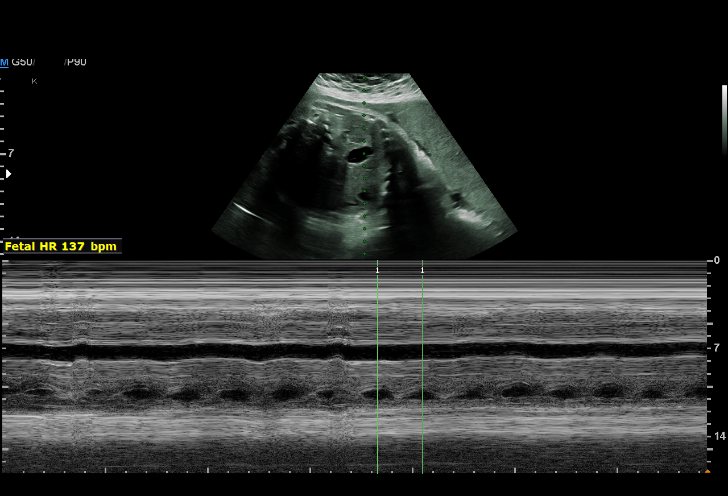
[im 8/18]
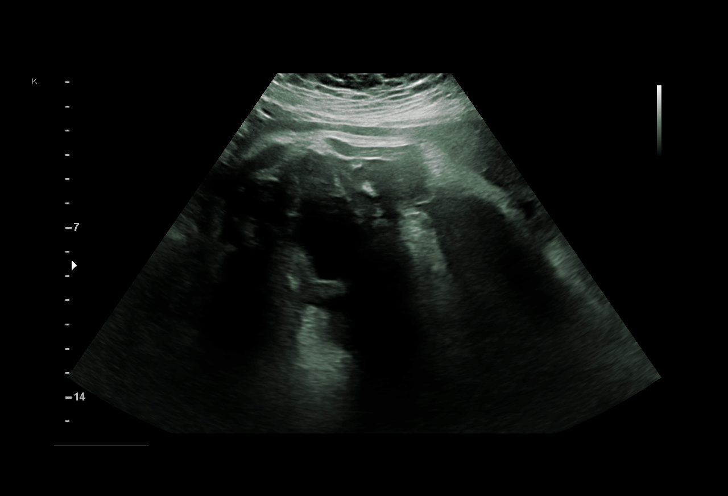
[im 10/18]
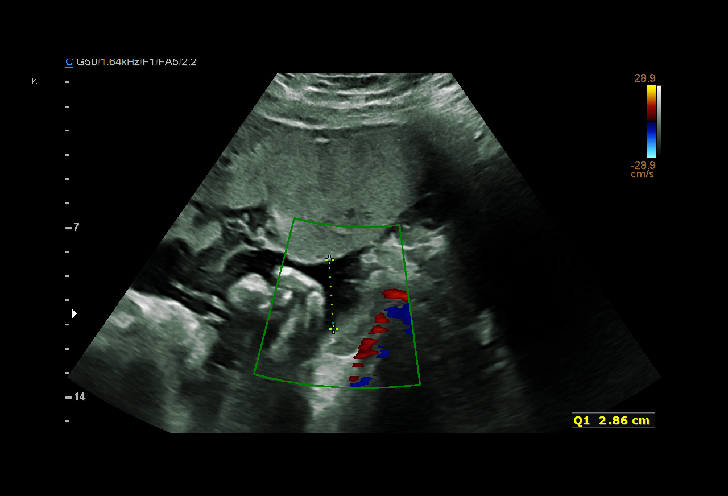
[im 11/18]
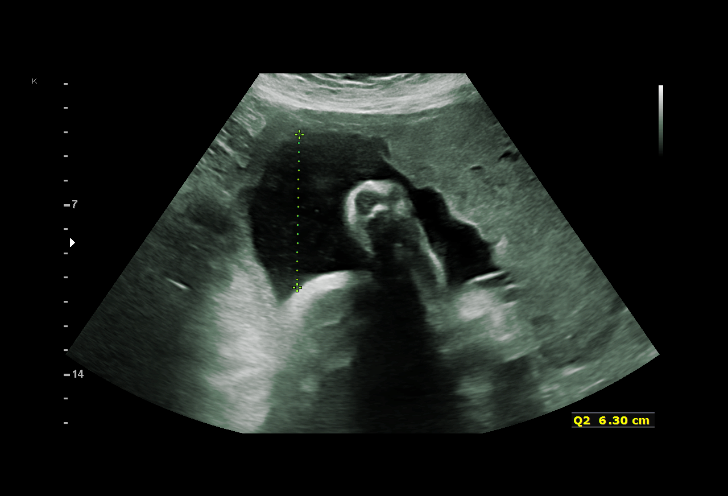
[im 12/18]
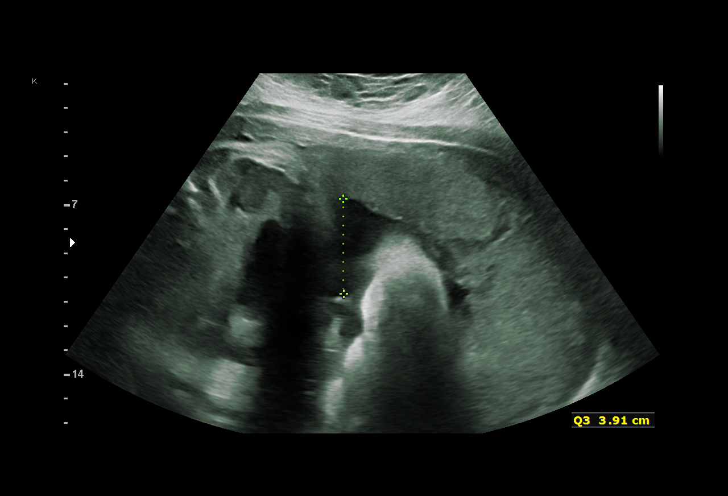
[im 13/18]
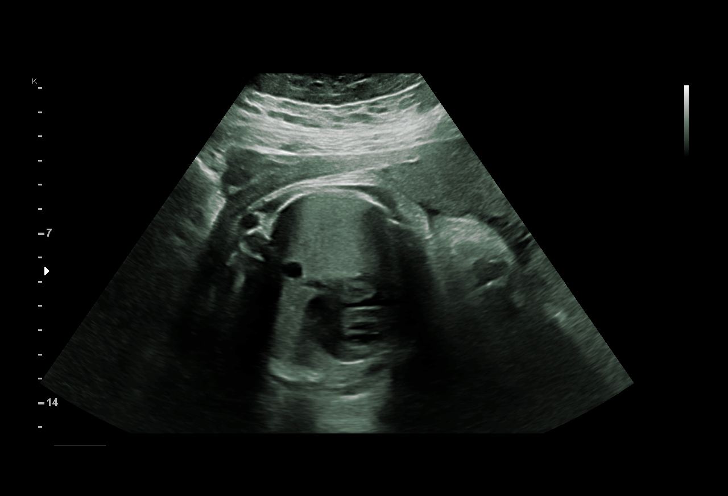
[im 14/18]
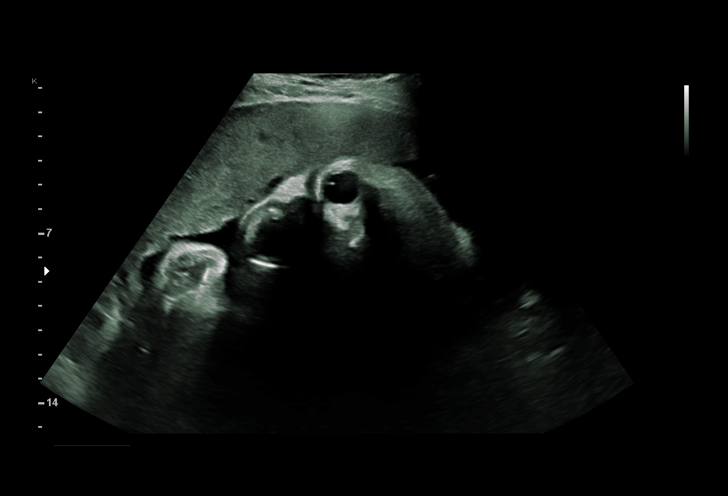
[im 16/18]
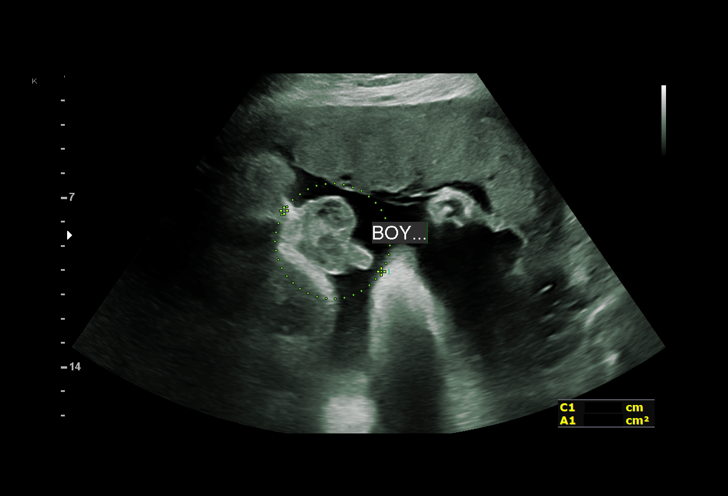
[im 17/18]
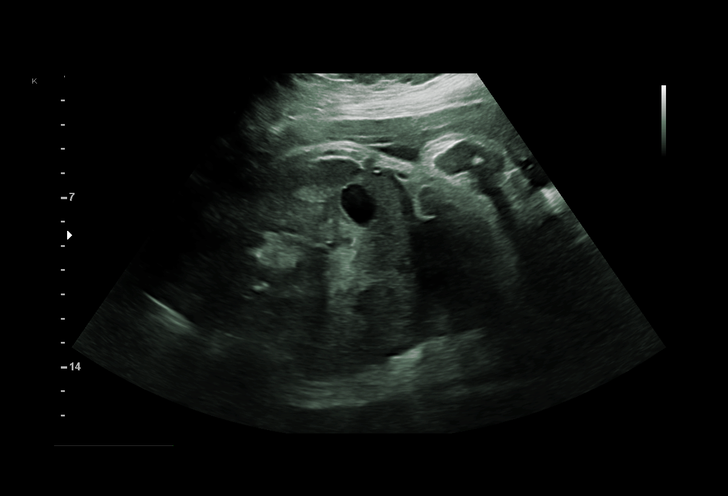
[im 18/18]
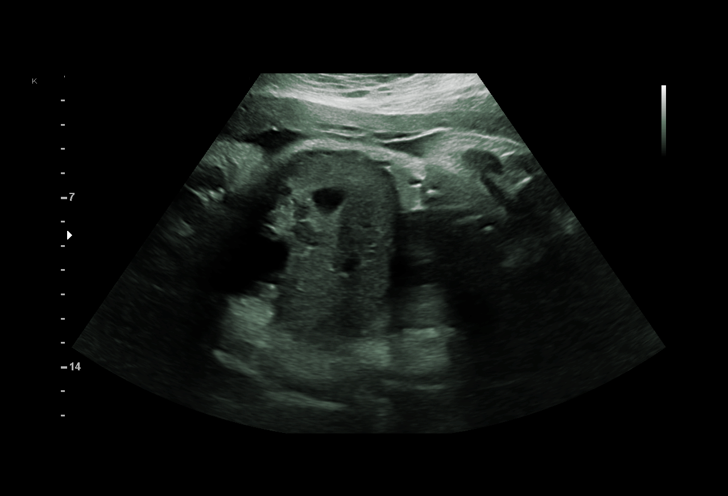

[15 of 18 positions shown; findings below may reference images not displayed]

[REDACTED][id number]@@@222
                                                            Care

Indications

 Gestational diabetes in pregnancy,             [D3]
 controlled by oral hypoglycemic drugs
 34 weeks gestation of pregnancy
 Obesity complicating pregnancy, third          [D3]
 trimester
 Isoimmunization - Rh                           [D3]
 Silent GAO
Fetal Evaluation

 Num Of Fetuses:         1
 Fetal Heart Rate(bpm):  137
 Cardiac Activity:       Observed
 Presentation:           Cephalic

 Amniotic Fluid
 AFI FV:      Within normal limits

 AFI Sum(cm)     %Tile       Largest Pocket(cm)
 13.1            43

 RUQ(cm)                     LUQ(cm)        LLQ(cm)

Biophysical Evaluation

 Amniotic F.V:   Within normal limits       F. Tone:        Observed
 F. Movement:    Observed                   Score:          [DATE]
 F. Breathing:   Observed
OB History

 Gravidity:    1
Gestational Age

 LMP:           34w 3d        Date:  [DATE]                 EDD:   [DATE]
 Best:          34w 3d     Det. By:  LMP  ([DATE])          EDD:   [DATE]
Anatomy

 Lips:                  Appears normal         Stomach:                Appears normal, left
                                                                       sided
 Thoracic:              Appears normal         Kidneys:                Appear normal
 Diaphragm:             Appears normal         Bladder:                Appears normal
Cervix Uterus Adnexa

 Cervix
 Not visualized (advanced GA >[D3])
Comments

 This patient was seen for a biophysical profile as her
 biophysical profile yesterday afternoon was [DATE].  She
 was admitted for observation during which time she has had
 a reactive nonstress test.
 A biophysical profile performed today was [DATE].
 There was normal amniotic fluid noted on today's ultrasound
 exam.
 The patient should return to the [HOSPITAL] for another
 biophysical profile next week.

## 2019-10-15 IMAGING — US US MFM FETAL BPP W/O NON-STRESS
1 series · 2 of 2 positions shown · non-contrast
Comparison: none

[Series 1: us mfm fetal bpp w/o non-stress · 2 acquisitions, 2 frames shown]
[im 1/2]
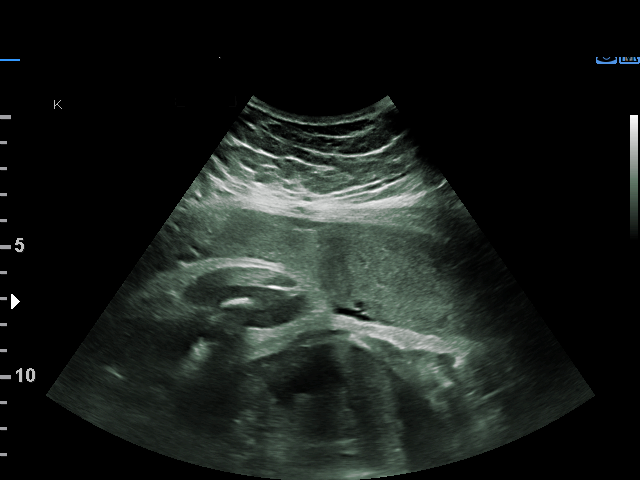
[im 2/2]
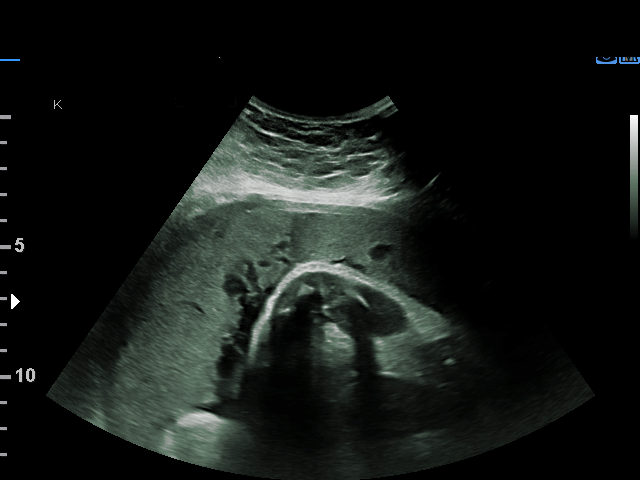

[2 of 2 positions shown; findings below may reference images not displayed]

[REDACTED]MSazz@@@222
                                                            Care

Indications

 Gestational diabetes in pregnancy,             [D3]
 controlled by oral hypoglycemic drugs
 34 weeks gestation of pregnancy
 Obesity complicating pregnancy, third          [D3]
 trimester
 Isoimmunization - Rh                           [D3]
 Silent GAO
Fetal Evaluation

 Num Of Fetuses:         1
 Fetal Heart Rate(bpm):  137
 Cardiac Activity:       Observed
 Presentation:           Cephalic

 Amniotic Fluid
 AFI FV:      Within normal limits

 AFI Sum(cm)     %Tile       Largest Pocket(cm)
 13.1            43

 RUQ(cm)                     LUQ(cm)        LLQ(cm)

Biophysical Evaluation

 Amniotic F.V:   Within normal limits       F. Tone:        Observed
 F. Movement:    Observed                   Score:          [DATE]
 F. Breathing:   Observed
OB History

 Gravidity:    1
Gestational Age

 LMP:           34w 3d        Date:  [DATE]                 EDD:   [DATE]
 Best:          34w 3d     Det. By:  LMP  ([DATE])          EDD:   [DATE]
Anatomy

 Lips:                  Appears normal         Stomach:                Appears normal, left
                                                                       sided
 Thoracic:              Appears normal         Kidneys:                Appear normal
 Diaphragm:             Appears normal         Bladder:                Appears normal
Cervix Uterus Adnexa

 Cervix
 Not visualized (advanced GA >[D3])
Comments

 This patient was seen for a biophysical profile as her
 biophysical profile yesterday afternoon was [DATE].  She
 was admitted for observation during which time she has had
 a reactive nonstress test.
 A biophysical profile performed today was [DATE].
 There was normal amniotic fluid noted on today's ultrasound
 exam.
 The patient should return to the [HOSPITAL] for another
 biophysical profile next week.

## 2019-10-15 MED ORDER — PROMETHAZINE HCL 25 MG/ML IJ SOLN
25.0000 mg | Freq: Four times a day (QID) | INTRAMUSCULAR | Status: DC | PRN
  Administered 2019-10-15: 25 mg via INTRAVENOUS
  Filled 2019-10-15: qty 1

## 2019-10-15 MED ORDER — LACTATED RINGERS IV BOLUS
1000.0000 mL | Freq: Once | INTRAVENOUS | Status: AC
  Administered 2019-10-15: 1000 mL via INTRAVENOUS

## 2019-10-15 NOTE — Discharge Summary (Signed)
Physician Discharge Summary  °Patient ID: °Martha Hall °MRN: [id number] °DOB/AGE: 08/20/1995 24 y.o. ° °Admit date: 10/14/2019 °Discharge date: 10/15/2019 ° °Admission Diagnoses: Non reassuring fetal testing ° °Discharge Diagnoses:  °Active Problems: °  Non-reassuring fetal heart rate or rhythm affecting mother ° ° °Discharged Condition: good ° °Hospital Course: Patient sent from MFM suite for observation due to BPP 4/8 (-2 for tone and breathing). Fetal status remained reassuring throughout her hospitalization. Repeat BPP 10/10 the following morning. Patient found stable for discharge and instructed to keep her ROB on 6/10 as previously scheduled. Fetal movement precautions reviewed. Patient to continue metformin for DM control ° ° °Discharge Exam: °Blood pressure 115/67, pulse 98, temperature 98.6 °F (37 °C), temperature source Oral, resp. rate 18, height 5' 1" (1.549 m), weight 107.5 kg, last menstrual period 02/16/2019, SpO2 100 %. °GENERAL: Well-developed, well-nourished female in no acute distress.  °LUNGS: Clear to auscultation bilaterally.  °HEART: Regular rate and rhythm. °ABDOMEN: Soft, nontender, gravid °PELVIC: Not indicated °EXTREMITIES: No cyanosis, clubbing, or edema, 2+ distal pulses. ° °FHT: baseline 120, mod variability, +accels, no decels °Toco: irregular ° °Disposition:  °There are no questions and answers to display.  ° ° ° ° ° ° ° °Allergies as of 10/15/2019   °No Known Allergies °  °  °Medication List  °  °TAKE these medications   °Accu-Chek Guide w/Device Kit °1 each by Does not apply route in the morning, at noon, in the evening, and at bedtime. °  °Accu-Chek Softclix Lancets lancets °Use as instructed °  °aspirin EC 81 MG tablet °Take 1 tablet (81 mg total) by mouth daily. Take after 12 weeks for prevention of preeclampsia later in pregnancy °  °Blood Pressure Kit Devi °1 kit by Does not apply route once a week. Check BP regularly and record readings into the Babys[employer]s App.  Large Cuff.  DX  O90.0 °  °cyclobenzaprine 10 MG tablet °Commonly known as: FLEXERIL °Take 1 tablet (10 mg total) by mouth 3 (three) times daily as needed for muscle spasms. °  °Doxylamine-Pyridoxine 10-10 MG Tbec °Commonly known as: Diclegis °Take 2 tablets at bedtime, may add 1 tablet at breakfast & 1 tablet at lunch if needed. °  °famotidine 20 MG tablet °Commonly known as: PEPCID °Take 1 tablet (20 mg total) by mouth 2 (two) times daily. °  °glucose blood test strip °Use 1 test strip to check glucose 4 times daily. °  °metFORMIN 500 MG tablet °Commonly known as: GLUCOPHAGE °Take 1 tablet (500 mg total) by mouth at bedtime. °  °Prenatal Vitamin 27-0.8 MG Tabs °Take 1 tablet by mouth daily. °  °  ° °Follow-up Information   ° [facility] Follow up.   °Why: As scheduled tomorrow °Contact information: °[address] °[place] [address] °[phone] ° °  °  °  ° ° °Signed: °  °10/15/2019, 11:28 AM ° °

## 2019-10-15 NOTE — Discharge Instructions (Signed)
Fetal Movement Counts Patient Name: ________________________________________________ Patient Due Date: ____________________ What is a fetal movement count?  A fetal movement count is the number of times that you feel your baby move during a certain amount of time. This may also be called a fetal kick count. A fetal movement count is recommended for every pregnant woman. You may be asked to start counting fetal movements as early as week 28 of your pregnancy. Pay attention to when your baby is most active. You may notice your baby's sleep and wake cycles. You may also notice things that make your baby move more. You should do a fetal movement count:  When your baby is normally most active.  At the same time each day. A good time to count movements is while you are resting, after having something to eat and drink. How do I count fetal movements? 1. Find a quiet, comfortable area. Sit, or lie down on your side. 2. Write down the date, the start time and stop time, and the number of movements that you felt between those two times. Take this information with you to your health care visits. 3. Write down your start time when you feel the first movement. 4. Count kicks, flutters, swishes, rolls, and jabs. You should feel at least 10 movements. 5. You may stop counting after you have felt 10 movements, or if you have been counting for 2 hours. Write down the stop time. 6. If you do not feel 10 movements in 2 hours, contact your health care provider for further instructions. Your health care provider may want to do additional tests to assess your baby's well-being. Contact a health care provider if:  You feel fewer than 10 movements in 2 hours.  Your baby is not moving like he or she usually does. Date: ____________ Start time: ____________ Stop time: ____________ Movements: ____________ Date: ____________ Start time: ____________ Stop time: ____________ Movements: ____________ Date: ____________  Start time: ____________ Stop time: ____________ Movements: ____________ Date: ____________ Start time: ____________ Stop time: ____________ Movements: ____________ Date: ____________ Start time: ____________ Stop time: ____________ Movements: ____________ Date: ____________ Start time: ____________ Stop time: ____________ Movements: ____________ Date: ____________ Start time: ____________ Stop time: ____________ Movements: ____________ Date: ____________ Start time: ____________ Stop time: ____________ Movements: ____________ Date: ____________ Start time: ____________ Stop time: ____________ Movements: ____________ This information is not intended to replace advice given to you by your health care provider. Make sure you discuss any questions you have with your health care provider. Document Revised: 12/12/2018 Document Reviewed: 12/12/2018 Elsevier Patient Education  2020 [employer]  

## 2019-10-16 ENCOUNTER — Ambulatory Visit (INDEPENDENT_AMBULATORY_CARE_PROVIDER_SITE_OTHER): Payer: Medicaid Other | Admitting: Obstetrics & Gynecology

## 2019-10-16 ENCOUNTER — Other Ambulatory Visit: Payer: Self-pay

## 2019-10-16 VITALS — BP 109/73 | HR 108 | Wt 241.0 lb

## 2019-10-16 DIAGNOSIS — O24415 Gestational diabetes mellitus in pregnancy, controlled by oral hypoglycemic drugs: Secondary | ICD-10-CM

## 2019-10-16 DIAGNOSIS — Z3A34 34 weeks gestation of pregnancy: Secondary | ICD-10-CM

## 2019-10-16 NOTE — Patient Instructions (Signed)
Gestational Diabetes Mellitus, Diagnosis Gestational diabetes (gestational diabetes mellitus) is a short-term (temporary) form of diabetes that can happen during pregnancy. It goes away after you give birth. It may be caused by one or both of these problems:  Your pancreas does not make enough of a hormone called insulin.  Your body does not respond in a normal way to insulin that it makes. Insulin lets sugars (glucose) go into cells in the body. This gives you energy. If you have diabetes, sugars cannot get into cells. This causes high blood sugar (hyperglycemia). If you get gestational diabetes, you are:  More likely to get it if you get pregnant again.  More likely to develop type 2 diabetes in the future. If gestational diabetes is treated, it may not hurt you or your baby. Your doctor will set treatment goals for you. In general, you should have these blood sugar levels:  After not eating for a long time (fasting): 95 mg/dL (5.3 mmol/L).  After meals (postprandial): ? One hour after a meal: at or below 140 mg/dL (7.8 mmol/L). ? Two hours after a meal: at or below 120 mg/dL (6.7 mmol/L).  A1c (hemoglobin A1c) level: 6-6.5%. Follow these instructions at home: Questions to ask your doctor   You may want to ask these questions: ? Do I need to meet with a diabetes educator? ? What equipment will I need to care for myself at home? ? What medicines do I need? When should I take them? ? How often do I need to check my blood sugar? ? What number can I call if I have questions? ? When is my next doctor's visit? General instructions  Take over-the-counter and prescription medicines only as told by your doctor.  Stay at a healthy weight during pregnancy.  Keep all follow-up visits as told by your doctor. This is important. Contact a doctor if:  Your blood sugar is at or above 240 mg/dL (13.3 mmol/L).  Your blood sugar is at or above 200 mg/dL (11.1 mmol/L) and you have ketones in  your pee (urine).  You have been sick or have had a fever for 2 days or more and you are not getting better.  You have any of these problems for more than 6 hours: ? You cannot eat or drink. ? You feel sick to your stomach (nauseous). ? You throw up (vomit). ? You have watery poop (diarrhea). Get help right away if:  Your blood sugar is lower than 54 mg/dL (3 mmol/L).  You get confused.  You have trouble: ? Thinking clearly. ? Breathing.  Your baby moves less than normal.  You have any of these: ? Moderate or large ketone levels in your pee. ? Blood coming from your vagina. ? Unusual fluid coming from your vagina. ? Early contractions. These may feel like tightness in your belly. Summary  Gestational diabetes is a short-term form of diabetes. It can happen while you are pregnant. It goes away after you give birth.  If gestational diabetes is treated, it may not hurt you or your baby. Your doctor will set treatment goals for you.  Keep all follow-up visits as told by your doctor. This is important. This information is not intended to replace advice given to you by your health care provider. Make sure you discuss any questions you have with your health care provider. Document Revised: 05/31/2017 Document Reviewed: 05/28/2015 Elsevier Patient Education  2020 Elsevier Inc.  

## 2019-10-16 NOTE — Progress Notes (Signed)
   PRENATAL VISIT NOTE  Subjective:  Martha Hall is a 24 y.o. G1P0000 at [redacted]w[redacted]d being seen today for ongoing prenatal care.  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  Patient reports no complaints.  Contractions: Not present. Vag. Bleeding: None.  Movement: Present. Denies leaking of fluid. Fasting BS 85-100, Postprandial BS 120s  The following portions of the patient's history were reviewed and updated as appropriate: allergies, current medications, past family history, past medical history, past social history, past surgical history and problem list.   Objective:   Vitals:   10/16/19 1607  BP: 109/73  Pulse: (!) 108  Weight: 109.3 kg    Fetal Status: Fetal Heart Rate (bpm): 138   Movement: Present     General:  Alert, oriented and cooperative. Patient is in no acute distress.  Skin: Skin is warm and dry. No rash noted.   Cardiovascular: Normal heart rate noted  Respiratory: Normal respiratory effort, no problems with respiration noted  Abdomen: Soft, gravid, appropriate for gestational age.  Pain/Pressure: Present     Pelvic: Cervical exam deferred        Extremities: Normal range of motion.  Edema: None  Mental Status: Normal mood and affect. Normal behavior. Normal judgment and thought content.   Assessment and Plan:  Pregnancy: G1P0000 at [redacted]w[redacted]d 1. Gestational diabetes mellitus (GDM) in third trimester controlled on oral hypoglycemic drug Plan for induction at 39 weeks, continue weekly BPP, and cultures next visit.     Preterm labor symptoms and general obstetric precautions including but not limited to vaginal bleeding, contractions, leaking of fluid and fetal movement were reviewed in detail with the patient. Please refer to After Visit Summary for other counseling recommendations.   No follow-ups on file.  Future Appointments  Date Time Provider Department Center  10/21/2019  3:30 PM [facility] NURSE [facility]  10/21/2019  3:30 PM [facility] US5 [facility]  10/28/2019  3:30 PM [facility] NURSE [facility]  10/28/2019  3:30 PM [facility] US5 [facility]  10/29/2019 10:30 AM Adam Phenix, MD [facility] None  10/30/2019  9:10 AM GI-315 MR 2 GI-315MRI [id number]  11/20/2019  8:15 AM Glean Salvo, NP [facility] None    Malachy Chamber, MD

## 2019-10-16 NOTE — Progress Notes (Signed)
ROB, she wants to know if she is going to be induced at 39 weeks.

## 2019-10-21 ENCOUNTER — Encounter: Payer: Self-pay | Admitting: *Deleted

## 2019-10-21 ENCOUNTER — Ambulatory Visit: Payer: Medicaid Other | Admitting: *Deleted

## 2019-10-21 ENCOUNTER — Ambulatory Visit: Payer: Medicaid Other | Attending: Obstetrics and Gynecology

## 2019-10-21 ENCOUNTER — Other Ambulatory Visit: Payer: Self-pay

## 2019-10-21 DIAGNOSIS — E668 Other obesity: Secondary | ICD-10-CM | POA: Diagnosis not present

## 2019-10-21 DIAGNOSIS — A5901 Trichomonal vulvovaginitis: Secondary | ICD-10-CM | POA: Insufficient documentation

## 2019-10-21 DIAGNOSIS — O361930 Maternal care for other isoimmunization, third trimester, not applicable or unspecified: Secondary | ICD-10-CM | POA: Diagnosis not present

## 2019-10-21 DIAGNOSIS — Z23 Encounter for immunization: Secondary | ICD-10-CM | POA: Diagnosis not present

## 2019-10-21 DIAGNOSIS — Z3A35 35 weeks gestation of pregnancy: Secondary | ICD-10-CM

## 2019-10-21 DIAGNOSIS — O99210 Obesity complicating pregnancy, unspecified trimester: Secondary | ICD-10-CM

## 2019-10-21 DIAGNOSIS — D563 Thalassemia minor: Secondary | ICD-10-CM | POA: Diagnosis not present

## 2019-10-21 DIAGNOSIS — O24415 Gestational diabetes mellitus in pregnancy, controlled by oral hypoglycemic drugs: Secondary | ICD-10-CM | POA: Insufficient documentation

## 2019-10-21 DIAGNOSIS — O99213 Obesity complicating pregnancy, third trimester: Secondary | ICD-10-CM | POA: Diagnosis not present

## 2019-10-21 IMAGING — US US MFM FETAL BPP W/O NON-STRESS
1 series · 10 of 10 positions shown · non-contrast
Comparison: none

[Series 1: us mfm fetal bpp w/o non-stress · 10 acquisitions, 10 frames shown]
[im 1/10]
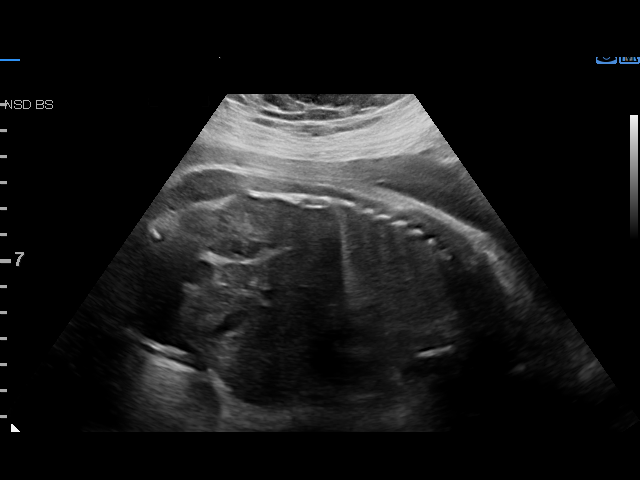
[im 2/10]
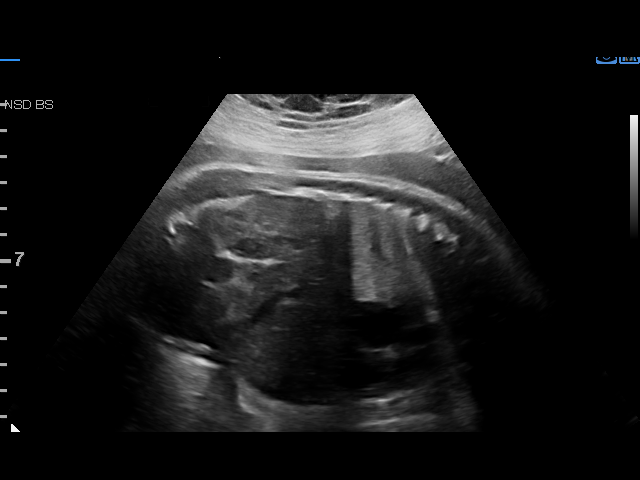
[im 3/10]
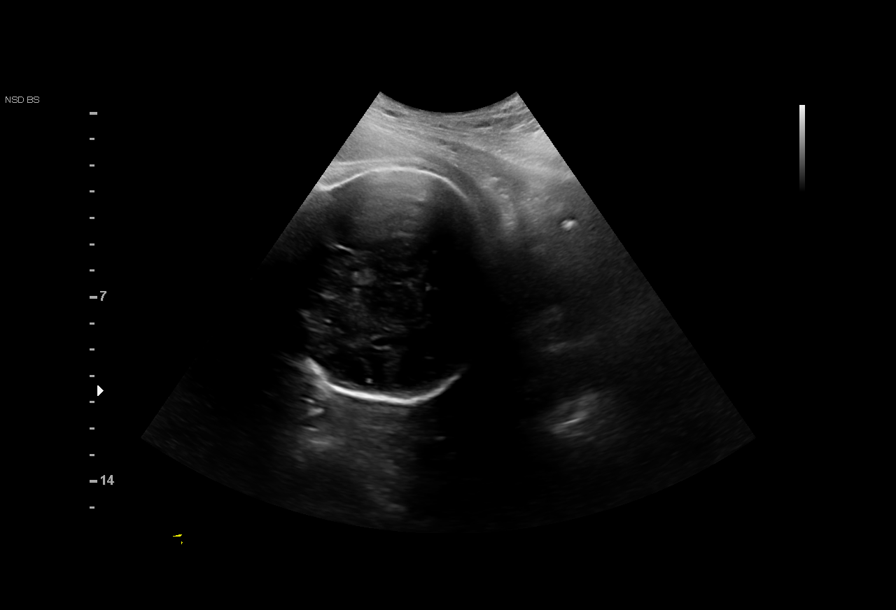
[im 4/10]
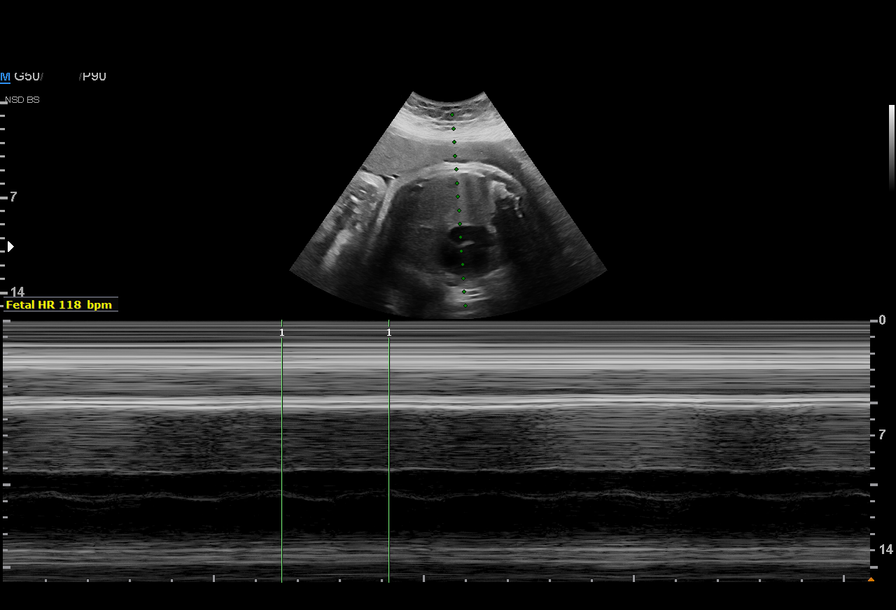
[im 5/10]
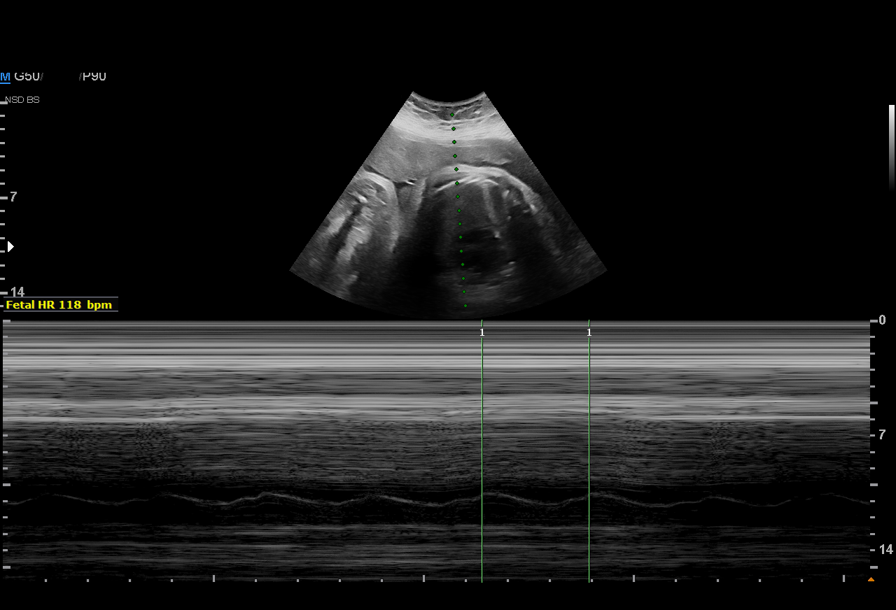
[im 6/10]
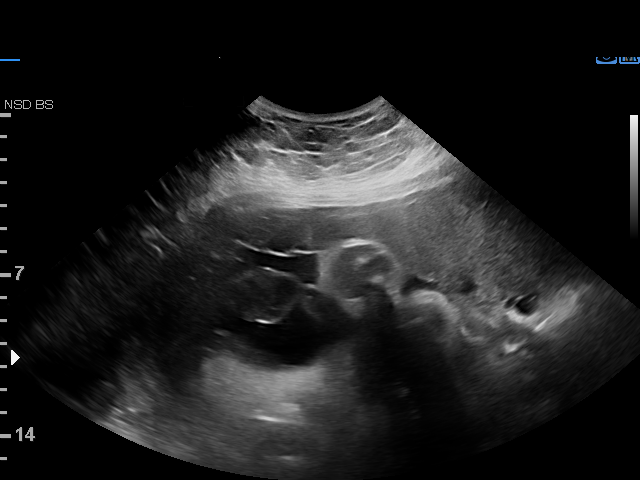
[im 7/10]
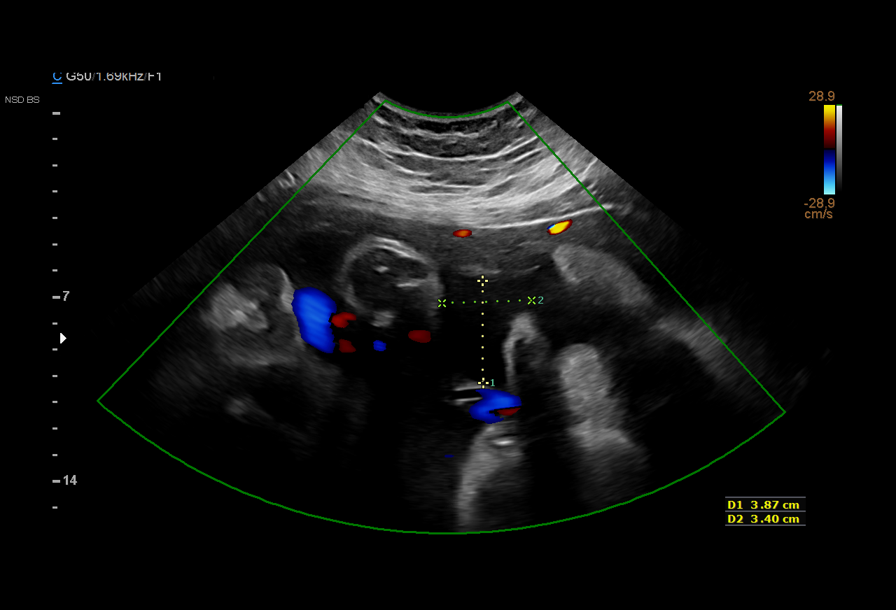
[im 8/10]
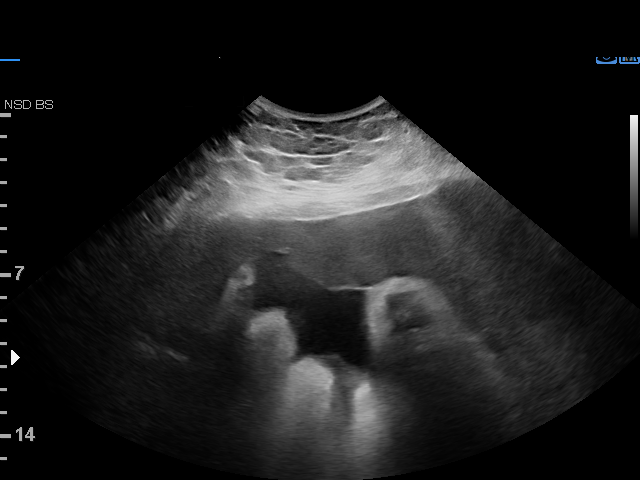
[im 9/10]
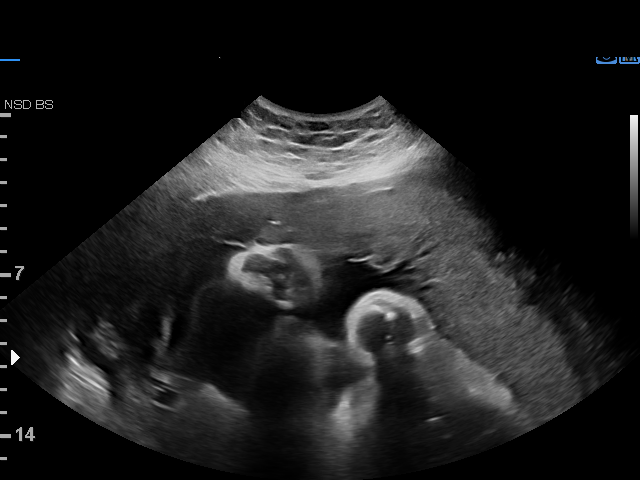
[im 10/10]
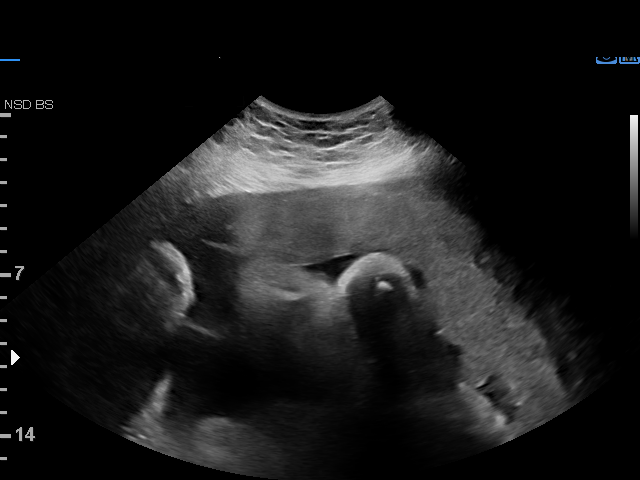

[10 of 10 positions shown; findings below may reference images not displayed]

Indications

 Gestational diabetes in pregnancy,             [H7]
 controlled by oral hypoglycemic drugs
 35 weeks gestation of pregnancy
 Obesity complicating pregnancy, third          [H7]
 trimester
 Isoimmunization - Rh                           [H7]
 Silent SUBIDA
Fetal Evaluation

 Num Of Fetuses:         1
 Fetal Heart Rate(bpm):  118
 Cardiac Activity:       Observed
 Presentation:           Cephalic

 Amniotic Fluid
 AFI FV:      Within normal limits

 AFI Sum(cm)     %Tile
 11.4            31

 RUQ(cm)       RLQ(cm)       LUQ(cm)        LLQ(cm)
 4
Biophysical Evaluation

 Amniotic F.V:   Within normal limits       F. Tone:        Observed
 F. Movement:    Observed                   Score:          [DATE]
 F. Breathing:   Observed
OB History

 Gravidity:    1
Gestational Age

 LMP:           35w 2d        Date:  [DATE]                 EDD:   [DATE]
 Best:          35w 2d     Det. By:  LMP  ([DATE])          EDD:   [DATE]
Comments

 This patient was seen for a biophysical profile due to
 gestational diabetes currently treated with Metformin.  The
 patient was evaluated in the hospital last week following a [DATE] biophysical profile in our office.  Her fetal heart rate
 tracing was reactive overnight and a repeat biophysical
 profile was [DATE] the following morning.  She denies any
 problems since her last exam.
 A biophysical profile performed today was [DATE].
 There was normal amniotic fluid noted on today's ultrasound
 exam.
 She will return in 1 week for another biophysical profile.

## 2019-10-28 ENCOUNTER — Ambulatory Visit: Payer: Medicaid Other | Admitting: *Deleted

## 2019-10-28 ENCOUNTER — Ambulatory Visit: Payer: Medicaid Other | Attending: Obstetrics and Gynecology

## 2019-10-28 ENCOUNTER — Encounter: Payer: Self-pay | Admitting: *Deleted

## 2019-10-28 ENCOUNTER — Other Ambulatory Visit: Payer: Self-pay

## 2019-10-28 DIAGNOSIS — O99210 Obesity complicating pregnancy, unspecified trimester: Secondary | ICD-10-CM | POA: Insufficient documentation

## 2019-10-28 DIAGNOSIS — A5901 Trichomonal vulvovaginitis: Secondary | ICD-10-CM

## 2019-10-28 DIAGNOSIS — O361930 Maternal care for other isoimmunization, third trimester, not applicable or unspecified: Secondary | ICD-10-CM | POA: Diagnosis not present

## 2019-10-28 DIAGNOSIS — D563 Thalassemia minor: Secondary | ICD-10-CM | POA: Diagnosis not present

## 2019-10-28 DIAGNOSIS — O99213 Obesity complicating pregnancy, third trimester: Secondary | ICD-10-CM

## 2019-10-28 DIAGNOSIS — O24415 Gestational diabetes mellitus in pregnancy, controlled by oral hypoglycemic drugs: Secondary | ICD-10-CM | POA: Diagnosis not present

## 2019-10-28 DIAGNOSIS — E668 Other obesity: Secondary | ICD-10-CM | POA: Diagnosis not present

## 2019-10-28 DIAGNOSIS — Z3A36 36 weeks gestation of pregnancy: Secondary | ICD-10-CM

## 2019-10-28 IMAGING — US US MFM FETAL BPP W/O NON-STRESS
1 series · 14 of 28 positions shown · non-contrast
Comparison: none

[Series 1: us mfm fetal bpp w/o non-stress · 36 acquisitions, 14 frames shown]
[im 2/36]
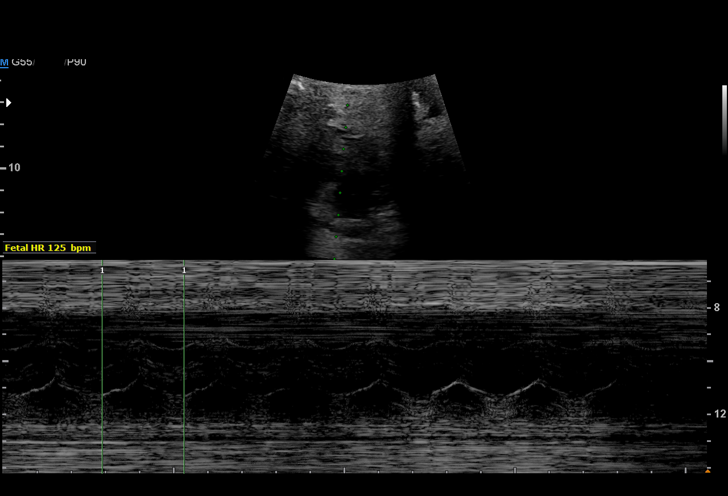
[im 4/36]
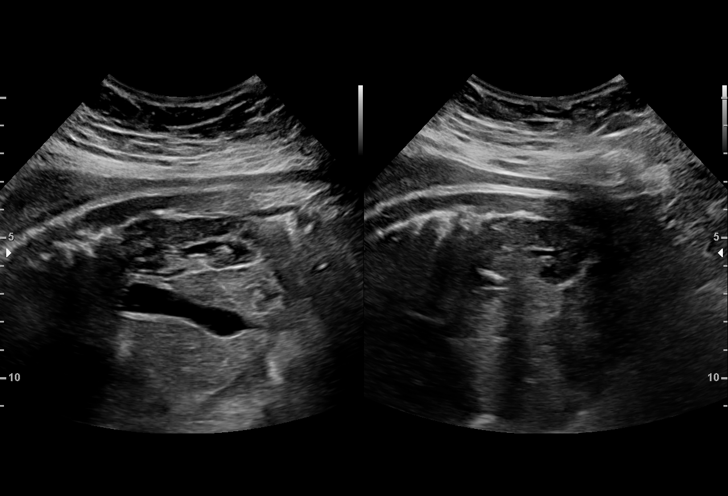
[im 7/36]
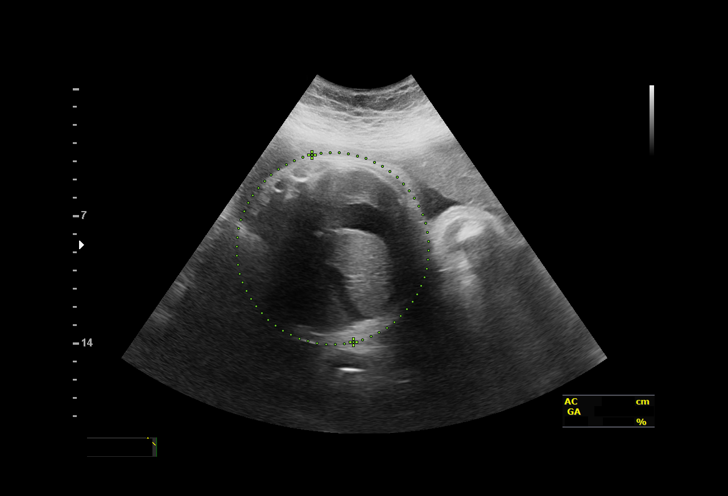
[im 10/36]
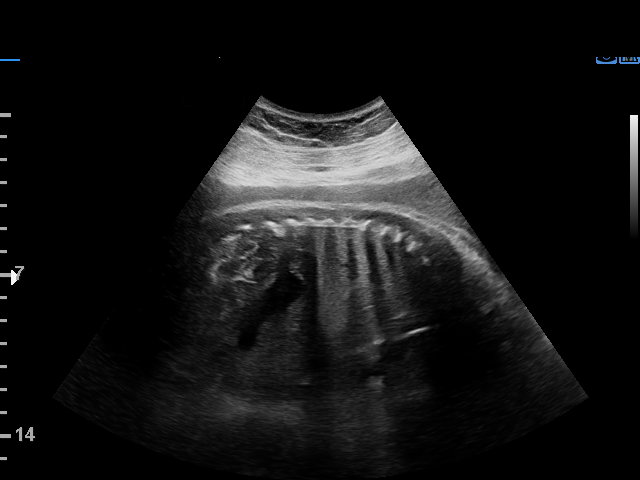
[im 12/36]
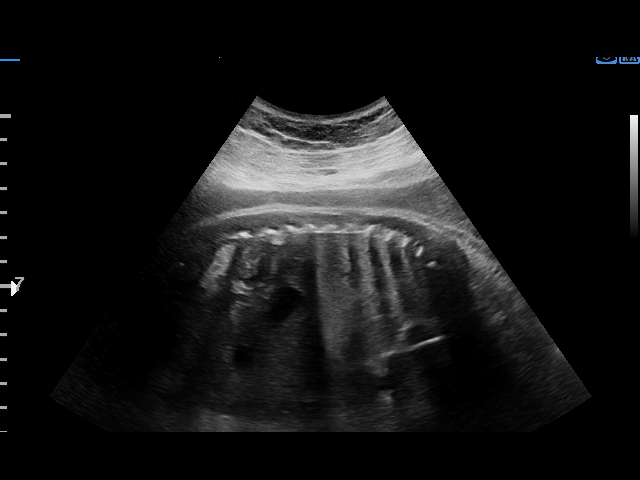
[im 15/36]
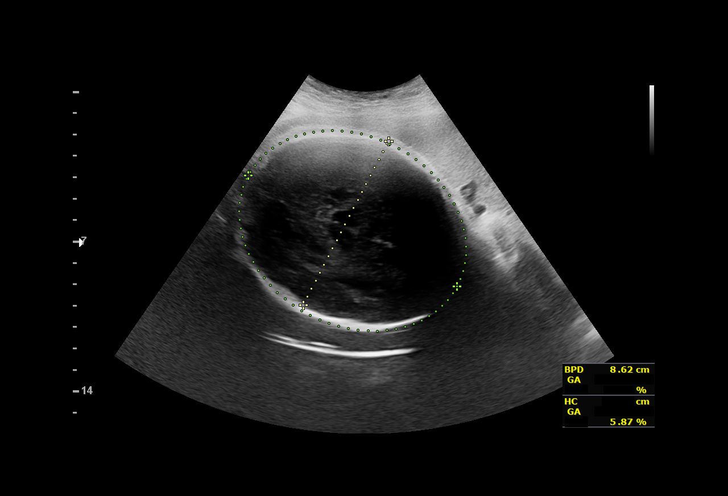
[im 17/36]
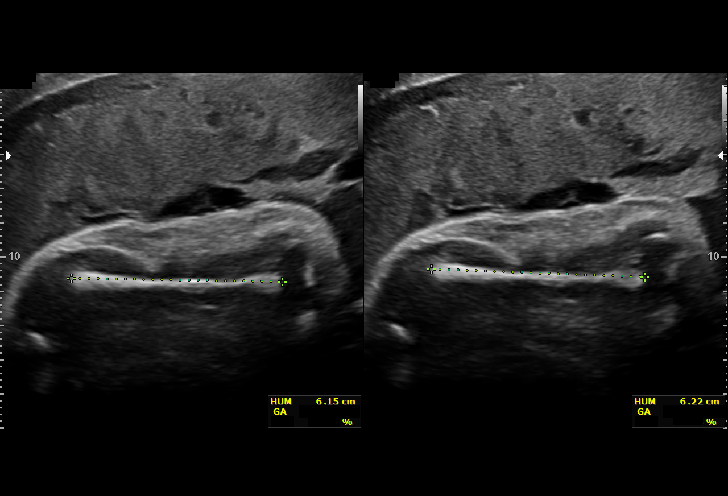
[im 20/36]
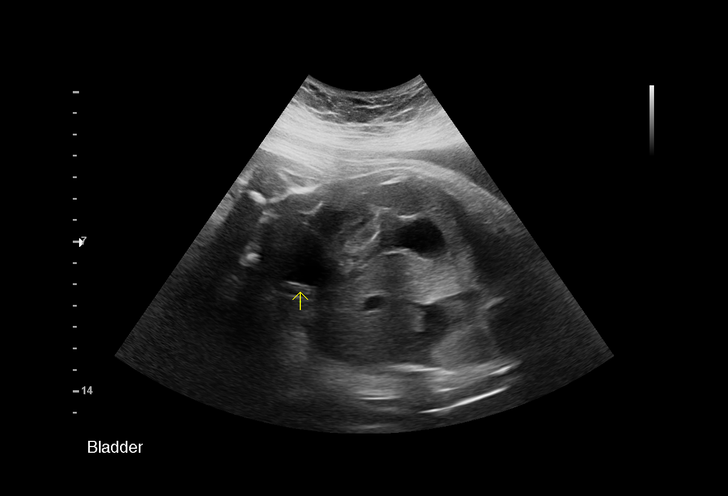
[im 23/36]
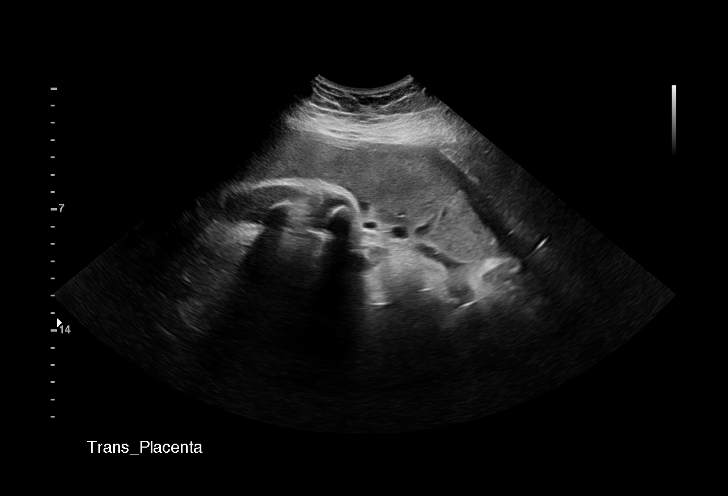
[im 25/36]
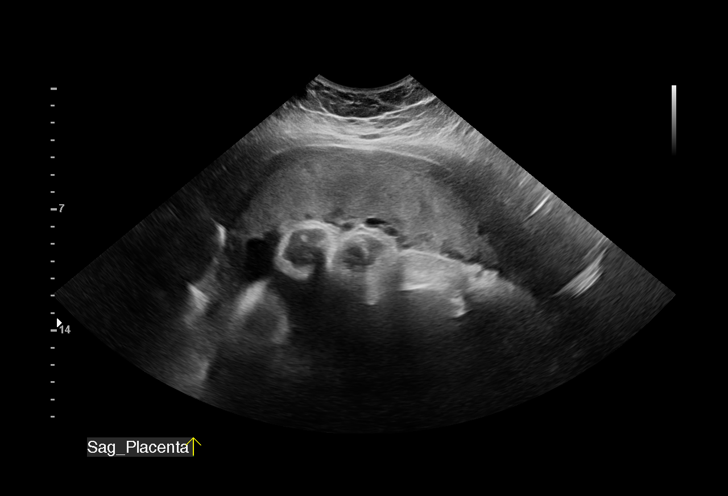
[im 28/36]
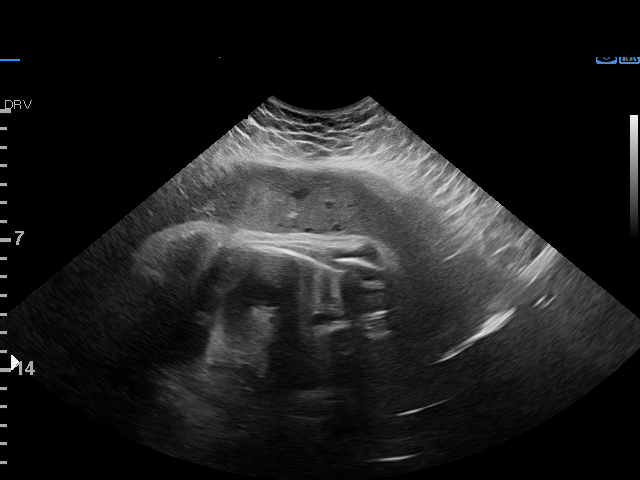
[im 30/36]
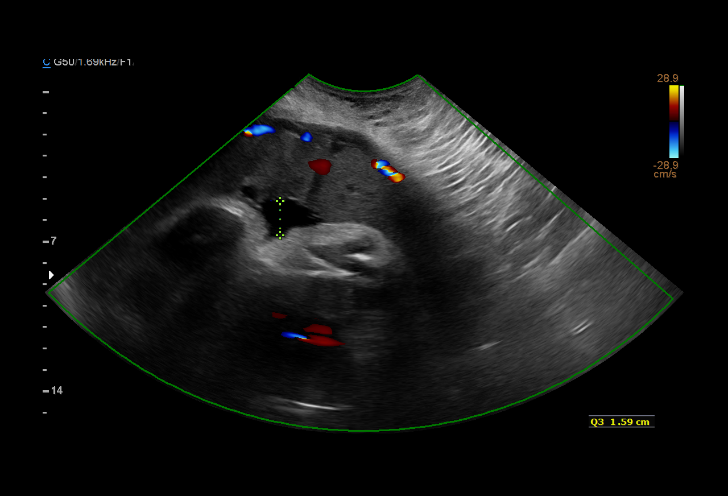
[im 33/36]
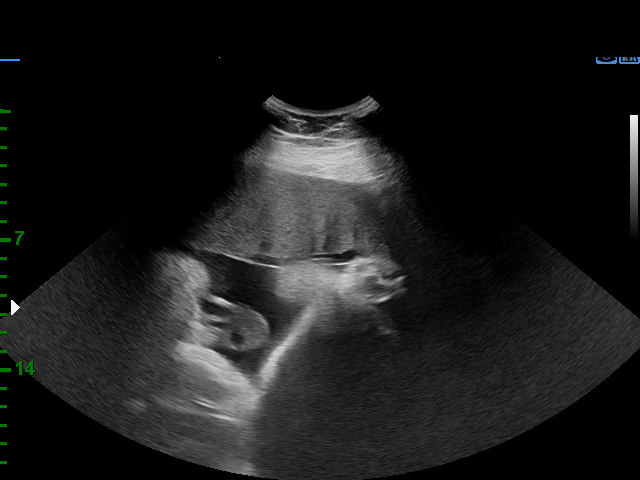
[im 36/36]
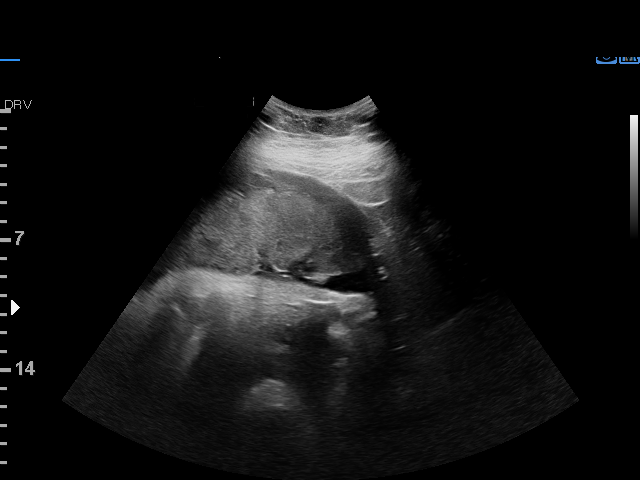

[14 of 28 positions shown; findings below may reference images not displayed]

Care

Indications

 Gestational diabetes in pregnancy,             [E3]
 controlled by oral hypoglycemic drugs
 Obesity complicating pregnancy, third          [E3]
 trimester
 36 weeks gestation of pregnancy
 Isoimmunization - Rh                           [E3]
 Silent SENG HIN
Fetal Evaluation

 Num Of Fetuses:         1
 Fetal Heart Rate(bpm):  125
 Cardiac Activity:       Observed
 Presentation:           Cephalic
 Placenta:               Anterior
 P. Cord Insertion:      Previously Visualized

 Amniotic Fluid
 AFI FV:      Within normal limits

 AFI Sum(cm)     %Tile       Largest Pocket(cm)
 7.2             4

 RUQ(cm)       RLQ(cm)       LUQ(cm)        LLQ(cm)
 0
Biophysical Evaluation

 Amniotic F.V:   Pocket => 2 cm             F. Tone:        Observed
 F. Movement:    Observed                   Score:          [DATE]
 F. Breathing:   Observed
Biometry

 BPD:      86.4  mm     G. Age:  34w 6d         21  %    CI:        74.67   %    70 - 86
                                                         FL/HC:      22.4   %    20.1 -
 HC:      317.3  mm     G. Age:  35w 5d         11  %    HC/AC:      0.95        0.93 -
 AC:      333.2  mm     G. Age:  37w 2d         84  %    FL/BPD:     82.4   %    71 - 87
 FL:       71.2  mm     G. Age:  36w 3d         52  %    FL/AC:      21.4   %    20 - 24
 HUM:        62  mm     G. Age:  35w 6d         58  %

 Est. FW:    [E3]  gm      6 lb 9 oz     61  %
OB History

 Gravidity:    1
Gestational Age

 LMP:           36w 2d        Date:  [DATE]                 EDD:   [DATE]
 U/S Today:     36w 1d                                        EDD:   [DATE]
 Best:          36w 2d     Det. By:  LMP  ([DATE])          EDD:   [DATE]
Anatomy

 Cranium:               Appears normal         LVOT:                   Previously seen
 Cavum:                 Previously seen        Aortic Arch:            Previously seen
 Ventricles:            Previously seen        Ductal Arch:            Previously seen
 Choroid Plexus:        Previously seen        Diaphragm:              Appears normal
 Cerebellum:            Previously seen        Stomach:                Appears normal, left
                                                                       sided
 Posterior Fossa:       Previously seen        Abdomen:                Previously seen
 Nuchal Fold:           Previously seen        Abdominal Wall:         Previously seen
 Face:                  Orbits and profile     Cord Vessels:           Previously seen
                        previously seen
 Lips:                  Previously seen        Kidneys:                Appear normal
 Palate:                Previously seen        Bladder:                Appears normal
 Thoracic:              Appears normal         Spine:                  Previously seen
 Heart:                 Previously seen        Upper Extremities:      Previously seen
 RVOT:                  Previously seen        Lower Extremities:      Previously seen

 Other:  Heels previously visualized.
Cervix Uterus Adnexa

 Cervix
 Not visualized (advanced GA >[E3])
Comments

 This patient was seen for a follow up growth scan due to
 gestational diabetes treated with Metformin.  She denies any
 problems since her last exam.
 She was informed that the fetal growth and amniotic fluid
 level appears appropriate for her gestational age.
 A biophysical profile performed today was [DATE].
 Another biophysical profile was scheduled in 1 week.

## 2019-10-29 ENCOUNTER — Other Ambulatory Visit: Payer: Self-pay | Admitting: *Deleted

## 2019-10-29 ENCOUNTER — Other Ambulatory Visit (HOSPITAL_COMMUNITY)
Admission: RE | Admit: 2019-10-29 | Discharge: 2019-10-29 | Disposition: A | Discharge status: Home / Self Care | Payer: Medicaid Other | Source: Ambulatory Visit | Attending: Obstetrics & Gynecology | Admitting: Obstetrics & Gynecology

## 2019-10-29 ENCOUNTER — Ambulatory Visit (INDEPENDENT_AMBULATORY_CARE_PROVIDER_SITE_OTHER): Payer: Medicaid Other | Admitting: Obstetrics & Gynecology

## 2019-10-29 VITALS — BP 113/79 | HR 103 | Wt 239.6 lb

## 2019-10-29 DIAGNOSIS — Z3A36 36 weeks gestation of pregnancy: Secondary | ICD-10-CM

## 2019-10-29 DIAGNOSIS — O24415 Gestational diabetes mellitus in pregnancy, controlled by oral hypoglycemic drugs: Secondary | ICD-10-CM

## 2019-10-29 DIAGNOSIS — Z3480 Encounter for supervision of other normal pregnancy, unspecified trimester: Secondary | ICD-10-CM

## 2019-10-29 MED ORDER — METOCLOPRAMIDE HCL 5 MG PO TABS: 5.0000 mg | ORAL_TABLET | Freq: Three times a day (TID) | ORAL | 2 refills | Status: DC

## 2019-10-29 MED ORDER — PANTOPRAZOLE SODIUM 40 MG PO TBEC: 40.0000 mg | DELAYED_RELEASE_TABLET | Freq: Every day | ORAL | 1 refills | Status: DC

## 2019-10-29 NOTE — Patient Instructions (Signed)

## 2019-10-29 NOTE — Progress Notes (Signed)
   PRENATAL VISIT NOTE  Subjective:  Martha Hall is a 24 y.o. G1P0000 at [redacted]w[redacted]d being seen today for ongoing prenatal care.  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  Patient reports fatigue, heartburn, nausea and pressure, can't sleep.  Contractions: Not present. Vag. Bleeding: None.  Movement: Present. Denies leaking of fluid.   The following portions of the patient's history were reviewed and updated as appropriate: allergies, current medications, past family history, past medical history, past social history, past surgical history and problem list.   Objective:   Vitals:   10/29/19 1023  BP: 113/79  Pulse: (!) 103  Weight: 239 lb 9.6 oz (108.7 kg)    Fetal Status: Fetal Heart Rate (bpm): 135 Fundal Height: 36 cm Movement: Present  Presentation: Vertex  General:  Alert, oriented and cooperative. Patient is in no acute distress.  Skin: Skin is warm and dry. No rash noted.   Cardiovascular: Normal heart rate noted  Respiratory: Normal respiratory effort, no problems with respiration noted  Abdomen: Soft, gravid, appropriate for gestational age.  Pain/Pressure: Present     Pelvic: Cervical exam performed in the presence of a chaperone Dilation: Fingertip Effacement (%): 20 Station: Ballotable  Extremities: Normal range of motion.  Edema: None  Mental Status: Normal mood and affect. Normal behavior. Normal judgment and thought content.   Assessment and Plan:  Pregnancy: G1P0000 at [redacted]w[redacted]d 1. Gestational diabetes mellitus (GDM) in third  trimester controlled on oral hypoglycemic drug Routine screen - Strep Gp B NAA - Cervicovaginal ancillary only( [place]) - pantoprazole (PROTONIX) 40 MG tablet; Take 1 tablet (40 mg total) by mouth daily.  Dispense: 30 tablet; Refill: 1 - metoCLOPramide (REGLAN) 5 MG tablet; Take 1 tablet (5 mg total) by mouth 3 (three) times daily before meals.  Dispense: 60 tablet; Refill: 2  2. Supervision of other normal pregnancy, antepartum States FBS 95, PP less than 120, normal growth and will plan IOL 39 weeks,weekly BPP  Preterm labor symptoms and general obstetric precautions including but not limited to vaginal bleeding, contractions, leaking of fluid and fetal movement were reviewed in detail with the patient. Please refer to After Visit Summary for other counseling recommendations.   Return in about 1 week (around 11/05/2019) for will come out of work in the next week until [facility], needs note stating this.  Future Appointments  Date Time Provider Department Center  10/30/2019  9:10 AM GI-315 MR 2 GI-315MRI [facility]  11/04/2019  3:30 PM [facility] NURSE [facility]  11/04/2019  3:30 PM [facility] US3 [facility]  11/13/2019  2:45 PM [facility]URSE [facility]  11/13/2019  2:45 PM [facility] US4 [facility]  11/20/2019  8:15 AM Christia Reading, Lindalou Hose, NP [facility] None    Scheryl Darter, MD

## 2019-10-29 NOTE — Progress Notes (Signed)
Pt is here for ROB, [redacted]w[redacted]d.  

## 2019-10-29 NOTE — Progress Notes (Signed)
Work note to start maternity leave printed for pt per Dr. Debroah Loop.

## 2019-10-30 ENCOUNTER — Inpatient Hospital Stay: Admission: RE | Admit: 2019-10-30 | Payer: Medicaid Other | Source: Ambulatory Visit

## 2019-10-30 LAB — CERVICOVAGINAL ANCILLARY ONLY
Chlamydia: NEGATIVE
Comment: NEGATIVE
Comment: NORMAL
Neisseria Gonorrhea: NEGATIVE

## 2019-10-31 LAB — STREP GP B NAA: Strep Gp B NAA: NEGATIVE

## 2019-11-04 ENCOUNTER — Ambulatory Visit: Payer: Medicaid Other

## 2019-11-05 ENCOUNTER — Other Ambulatory Visit: Payer: Self-pay

## 2019-11-05 ENCOUNTER — Ambulatory Visit (INDEPENDENT_AMBULATORY_CARE_PROVIDER_SITE_OTHER): Payer: Medicaid Other | Admitting: Obstetrics and Gynecology

## 2019-11-05 ENCOUNTER — Telehealth (HOSPITAL_COMMUNITY): Payer: Self-pay | Admitting: *Deleted

## 2019-11-05 VITALS — BP 107/74 | HR 98 | Wt 238.0 lb

## 2019-11-05 DIAGNOSIS — O99213 Obesity complicating pregnancy, third trimester: Secondary | ICD-10-CM

## 2019-11-05 DIAGNOSIS — O99210 Obesity complicating pregnancy, unspecified trimester: Secondary | ICD-10-CM

## 2019-11-05 DIAGNOSIS — O361930 Maternal care for other isoimmunization, third trimester, not applicable or unspecified: Secondary | ICD-10-CM

## 2019-11-05 DIAGNOSIS — Z3480 Encounter for supervision of other normal pregnancy, unspecified trimester: Secondary | ICD-10-CM

## 2019-11-05 DIAGNOSIS — Z3A37 37 weeks gestation of pregnancy: Secondary | ICD-10-CM

## 2019-11-05 DIAGNOSIS — O24415 Gestational diabetes mellitus in pregnancy, controlled by oral hypoglycemic drugs: Secondary | ICD-10-CM

## 2019-11-05 DIAGNOSIS — Z6841 Body Mass Index (BMI) 40.0 and over, adult: Secondary | ICD-10-CM | POA: Insufficient documentation

## 2019-11-05 DIAGNOSIS — E669 Obesity, unspecified: Secondary | ICD-10-CM

## 2019-11-05 NOTE — Progress Notes (Signed)
   PRENATAL VISIT NOTE  Subjective:  Martha Hall is a 24 y.o. G1P0000 at [redacted]w[redacted]d being seen today for ongoing prenatal care.  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  Patient doing well with no acute concerns today. She reports no complaints.  Contractions: Not present. Vag. Bleeding: None.  Movement: Present. Denies leaking of fluid.   This delightful patient notes normal fasting and postprandial blood sugars.  She is compliant with her medication.  She states she is scheduled for IOL on 7/11.  Pt has scan on 11/06/2019 for BPP.  LAST EFW was at 61%  The following portions of the patient's history were reviewed and updated as appropriate: allergies, current medications, past family history, past medical history, past social history, past surgical history and problem list. Problem list updated.  Objective:   Vitals:   11/05/19 1423  BP: 107/74  Pulse: 98  Weight: 238 lb (108 kg)    Fetal Status: Fetal Heart Rate (bpm): 130   Movement: Present     General:  Alert, oriented and cooperative. Patient is in no acute distress.  Skin: Skin is warm and dry. No rash noted.   Cardiovascular: Normal heart rate noted  Respiratory: Normal respiratory effort, no problems with respiration noted  Abdomen: Soft, obese, gravid, appropriate for gestational age.  Pain/Pressure: Absent     Pelvic: Cervical exam deferred        Extremities: Normal range of motion.     Mental Status:  Normal mood  and affect. Normal behavior. Normal judgment and thought content.   Assessment and Plan:  Pregnancy: G1P0000 at [redacted]w[redacted]d  1. Supervision of other normal pregnancy, antepartum BPP on 7/1, scheduled for IOL on 7/11  2. Gestational diabetes mellitus (GDM) in third trimester controlled on oral hypoglycemic drug Good blood sugar control and compliance  3. Obesity in pregnancy   4. Lewis isoimmunization during pregnancy in third trimester, single or unspecified fetus   5. BMI 40.0-44.9, adult [facility])   Term labor symptoms and general obstetric precautions including but not limited to vaginal bleeding, contractions, leaking of fluid and fetal movement were reviewed in detail with the patient.  Answered questions regarding circumcision, breast feeding, and induction of labor.  Please refer to After Visit Summary for other counseling recommendations.   Return in about 1 week (around 11/12/2019) for [facility], in person.   [name] Aloe, MD

## 2019-11-05 NOTE — Telephone Encounter (Signed)
Preadmission screen  

## 2019-11-05 NOTE — Progress Notes (Signed)
Pt states her sugars have been doing well. Pt has no concerns today.

## 2019-11-05 NOTE — Patient Instructions (Signed)
Gestational Diabetes Mellitus, Self Care When you have gestational diabetes (gestational diabetes mellitus), you must make sure your blood sugar (glucose) stays in a healthy range. You can do this with:  Nutrition.  Exercise.  Lifestyle changes.  Medicines or insulin, if needed.  Support from your doctors and others. If you get treated for this condition, it may not hurt you or your unborn baby (fetus). If you do not get treated for this condition, it may cause problems that can hurt you or your unborn baby. If you get gestational diabetes, you are:  More likely to get it if you get pregnant again.  More likely to develop type 2 diabetes in the future. How to stay aware of blood sugar   Check your blood sugar every day while you are pregnant. Check it as often as told.  Call your doctor if your blood sugar is above your goal numbers for two tests in a row. Your doctor will set personal treatment goals for you. Generally, you should have these blood sugar levels:  Before meals, or after not eating for a long time (fasting or preprandial): at or below 95 mg/dL (5.3 mmol/L).  After meals (postprandial): ? One hour after a meal: at or below 140 mg/dL (7.8 mmol/L). ? Two hours after a meal: at or below 120 mg/dL (6.7 mmol/L).  A1c (hemoglobin A1c) level: 6-6.5%. How to manage high and low blood sugar Signs of high blood sugar High blood sugar is called hyperglycemia. Know the early signs of high blood sugar. Signs may include:  Feeling: ? Thirsty. ? Hungry. ? Very tired.  Needing to pee (urinate) more than usual.  Blurry vision. Signs of low blood sugar Low blood sugar is called hypoglycemia. This is when blood sugar is at or below 70 mg/dL (3.9 mmol/L). Signs may include:  Feeling: ? Hungry. ? Worried or nervous (anxious). ? Sweaty and clammy. ? Confused. ? Dizzy. ? Sleepy. ? Sick to your stomach (nauseous).  Having: ? A fast heartbeat. ? A headache. ? A change  in your vision. ? Tingling or no feeling (numbness) around your mouth, lips, or tongue. ? Jerky movements that you cannot control (seizure).  Having trouble with: ? Moving (coordination). ? Sleeping. ? Passing out (fainting). ? Getting upset easily (irritability). Treating low blood sugar To treat low blood sugar, eat or drink something sugary right away. If you can think clearly and swallow safely, follow the 15:15 rule:  Take 15 grams of a fast-acting carb (carbohydrate). Talk with your doctor about how much you should take.  Some fast-acting carbs are: ? Sugar tablets (glucose pills). Take 3-4 glucose pills. ? 6-8 pieces of hard candy. ? 4-6 oz (120-150 mL) of fruit juice. ? 4-6 oz (120-150 mL) of regular (not diet) soda. ? 1 Tbsp (15 mL) honey or sugar.  Check your blood sugar 15 minutes after you take the carb.  If your blood sugar is still at or below 70 mg/dL (3.9 mmol/L), take 15 grams of a carb again.  If your blood sugar does not go above 70 mg/dL (3.9 mmol/L) after 3 tries, get help right away.  After your blood sugar goes back to normal, eat a meal or a snack within 1 hour. Treating very low blood sugar If your blood sugar is at or below 54 mg/dL (3 mmol/L), you have very low blood sugar (severe hypoglycemia). This is an emergency. Do not wait to see if the symptoms will go away. Get medical help right  away. Call your local emergency services (911 in the [place] If you have very low blood sugar and you cannot eat or drink, you may need a glucagon shot (injection). A family member or friend should learn how to check your blood sugar and how to give you a glucagon shot. Ask your doctor if you need to have a glucagon shot kit at home. Follow these instructions at home: Medicine  Take your insulin and diabetes medicines as told.  If your doctor says you should take more or less insulin or medicines, do this exactly as told.  Do not run out of insulin or  medicines. Food   Make healthy food choices. These include: ? Chicken, fish, egg whites, and beans. ? Oats, whole wheat, bulgur, brown rice, quinoa, and millet. ? Fresh fruits and vegetables. ? Low-fat dairy products. ? Nuts, avocado, olive oil, and canola oil.  Meet with a food specialist (dietitian). He or she can help you make an eating plan that is right for you.  Follow instructions from your doctor about what you cannot eat or drink.  Drink enough fluid to keep your pee (urine) pale yellow.  Eat healthy snacks between healthy meals.  Keep track of carbs that you eat. Do this by reading food labels and learning food serving sizes.  Follow your sick day plan when you cannot eat or drink normally. Make this plan with your doctor so it is ready to use. Activity  Exercise for 30 or more minutes a day, or as much as your doctor recommends.  Talk with your doctor before you start a new exercise or activity. Your doctor may need to tell you to change: ? How much insulin or medicines you take. ? How much food you eat. Lifestyle  Do not drink alcohol.  Do not use any tobacco products. These include cigarettes, chewing tobacco, and e-cigarettes. If you need help quitting, ask your doctor.  Learn how to deal with stress. If you need help with this, ask your doctor. Body care  Stay up to date with your shots (immunizations).  Brush your teeth and gums two times a day. Floss one or more times a day.  Go to the dentist one or more times every 6 months.  Stay at a healthy weight while you are pregnant. General instructions  Take over-the-counter and prescription medicines only as told by your doctor.  Ask your doctor about risks of high blood pressure in pregnancy (preeclampsia and eclampsia).  Share your diabetes care plan with: ? Your work or school. ? People you live with.  Check your pee for ketones: ? When you are sick. ? As told by your doctor.  Carry a card or  wear jewelry that says you have diabetes.  Keep all follow-up visits as told by your doctor. This is important. Care after giving birth  Have your blood sugar checked 4-12 weeks after you give birth.  Get checked for diabetes one or more times during 3 years. Questions to ask your doctor  Do I need to meet with a diabetes educator?  Where can I find a support group for people with gestational diabetes? Where to find more information To learn more about diabetes, visit:  American Diabetes Association: www.diabetes.org  Centers for Disease Control and Prevention (CDC): www.cdc.gov Summary  Check your blood sugar (glucose) every day while you are pregnant. Check it as often as told.  Take your insulin and diabetes medicines as told.  Keep all follow-up visits as   told by your doctor. This is important.  Have your blood sugar checked 4-12 weeks after you give birth. This information is not intended to replace advice given to you by your health care provider. Make sure you discuss any questions you have with your health care provider. Document Revised: 10/15/2017 Document Reviewed: 05/28/2015 Elsevier Patient Education  2020 [place] induction is when steps are taken to cause a pregnant woman to begin the labor process. Most women go into labor on their own between 37 weeks and 42 weeks of pregnancy. When this does not happen or when there is a medical need for labor to begin, steps may be taken to induce labor. Labor induction causes a pregnant woman's uterus to contract. It also causes the cervix to soften (ripen), open (dilate), and thin out (efface). Usually, labor is not induced before 39 weeks of pregnancy unless there is a medical reason to do so. Your health care provider will determine if labor induction is needed. Before inducing labor, your health care provider will consider a number of factors, including:  Your medical condition and your  baby's.  How many weeks along you are in your pregnancy.  How mature your baby's lungs are.  The condition of your cervix.  The position of your baby.  The size of your birth canal. What are some reasons for labor induction? Labor may be induced if:  Your health or your baby's health is at risk.  Your pregnancy is overdue by 1 week or more.  Your water breaks but labor does not start on its own.  There is a low amount of amniotic fluid around your baby. You may also choose (elect) to have labor induced at a certain time. Generally, elective labor induction is done no earlier than 39 weeks of pregnancy. What methods are used for labor induction? Methods used for labor induction include:  Prostaglandin medicine. This medicine starts contractions and causes the cervix to dilate and ripen. It can be taken by mouth (orally) or by being inserted into the vagina (suppository).  Inserting a small, thin tube (catheter) with a balloon into the vagina and then expanding the balloon with water to dilate the cervix.  Stripping the membranes. In this method, your health care provider gently separates amniotic sac tissue from the cervix. This causes the cervix to stretch, which in turn causes the release of a hormone called progesterone. The hormone causes the uterus to contract. This procedure is often done during an office visit, after which you will be sent home to wait for contractions to begin.  Breaking the water. In this method, your health care provider uses a small instrument to make a small hole in the amniotic sac. This eventually causes the amniotic sac to break. Contractions should begin after a few hours.  Medicine to trigger or strengthen contractions. This medicine is given through an IV that is inserted into a vein in your arm. Except for membrane stripping, which can be done in a clinic, labor induction is done in the hospital so that you and your baby can be carefully  monitored. How long does it take for labor to be induced? The length of time it takes to induce labor depends on how ready your body is for labor. Some inductions can take up to 2-3 days, while others may take less than a day. Induction may take longer if:  You are induced early in your pregnancy.  It is your first pregnancy.  Your cervix is not ready. What are some risks associated with labor induction? Some risks associated with labor induction include:  Changes in fetal heart rate, such as being too high, too low, or irregular (erratic).  Failed induction.  Infection in the mother or the baby.  Increased risk of having a cesarean delivery.  Fetal death.  Breaking off (abruption) of the placenta from the uterus (rare).  Rupture of the uterus (very rare). When induction is needed for medical reasons, the benefits of induction generally outweigh the risks. What are some reasons for not inducing labor? Labor induction should not be done if:  Your baby does not tolerate contractions.  You have had previous surgeries on your uterus, such as a myomectomy, removal of fibroids, or a vertical scar from a previous cesarean delivery.  Your placenta lies very low in your uterus and blocks the opening of the cervix (placenta previa).  Your baby is not in a head-down position.  The umbilical cord drops down into the birth canal in front of the baby.  There are unusual circumstances, such as the baby being very early (premature).  You have had more than 2 previous cesarean deliveries. Summary  Labor induction is when steps are taken to cause a pregnant woman to begin the labor process.  Labor induction causes a pregnant woman's uterus to contract. It also causes the cervix to ripen, dilate, and efface.  Labor is not induced before 39 weeks of pregnancy unless there is a medical reason to do so.  When induction is needed for medical reasons, the benefits of induction generally  outweigh the risks. This information is not intended to replace advice given to you by your health care provider. Make sure you discuss any questions you have with your health care provider. Document Revised: 04/27/2017 Document Reviewed: 06/07/2016 Elsevier Patient Education  2020 [employer].

## 2019-11-06 ENCOUNTER — Telehealth (HOSPITAL_COMMUNITY): Payer: Self-pay | Admitting: *Deleted

## 2019-11-06 ENCOUNTER — Ambulatory Visit: Payer: Medicaid Other | Attending: Obstetrics

## 2019-11-06 ENCOUNTER — Ambulatory Visit: Payer: Medicaid Other | Admitting: *Deleted

## 2019-11-06 DIAGNOSIS — O361930 Maternal care for other isoimmunization, third trimester, not applicable or unspecified: Secondary | ICD-10-CM | POA: Diagnosis not present

## 2019-11-06 DIAGNOSIS — O99210 Obesity complicating pregnancy, unspecified trimester: Secondary | ICD-10-CM | POA: Insufficient documentation

## 2019-11-06 DIAGNOSIS — D563 Thalassemia minor: Secondary | ICD-10-CM

## 2019-11-06 DIAGNOSIS — O99213 Obesity complicating pregnancy, third trimester: Secondary | ICD-10-CM

## 2019-11-06 DIAGNOSIS — Z3A37 37 weeks gestation of pregnancy: Secondary | ICD-10-CM

## 2019-11-06 DIAGNOSIS — E669 Obesity, unspecified: Secondary | ICD-10-CM | POA: Diagnosis not present

## 2019-11-06 DIAGNOSIS — A5901 Trichomonal vulvovaginitis: Secondary | ICD-10-CM | POA: Diagnosis present

## 2019-11-06 DIAGNOSIS — O24415 Gestational diabetes mellitus in pregnancy, controlled by oral hypoglycemic drugs: Secondary | ICD-10-CM | POA: Diagnosis not present

## 2019-11-06 IMAGING — US US MFM FETAL BPP W/O NON-STRESS
1 series · 13 of 28 positions shown · non-contrast
Comparison: none

[Series 1: us mfm fetal bpp w/o non-stress · 35 acquisitions, 13 frames shown]
[im 2/35]
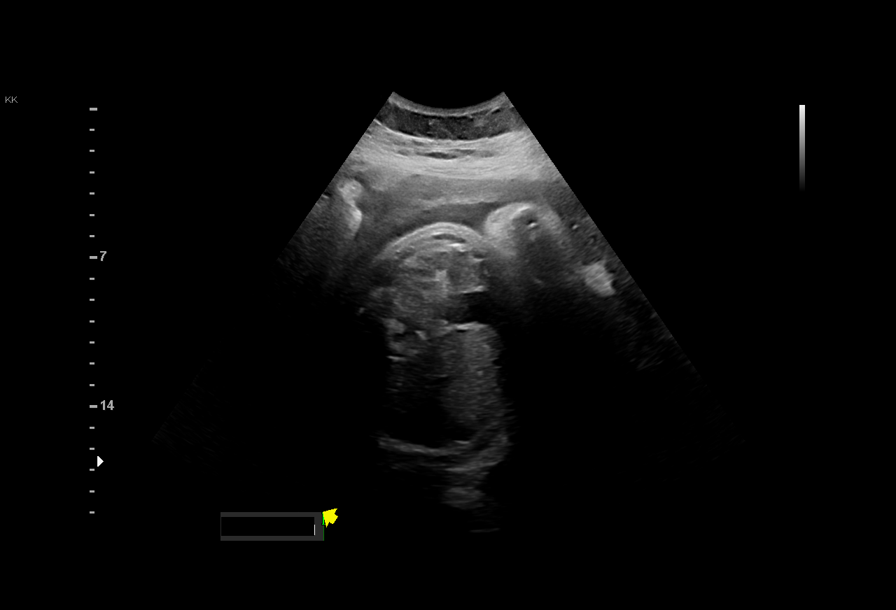
[im 4/35]
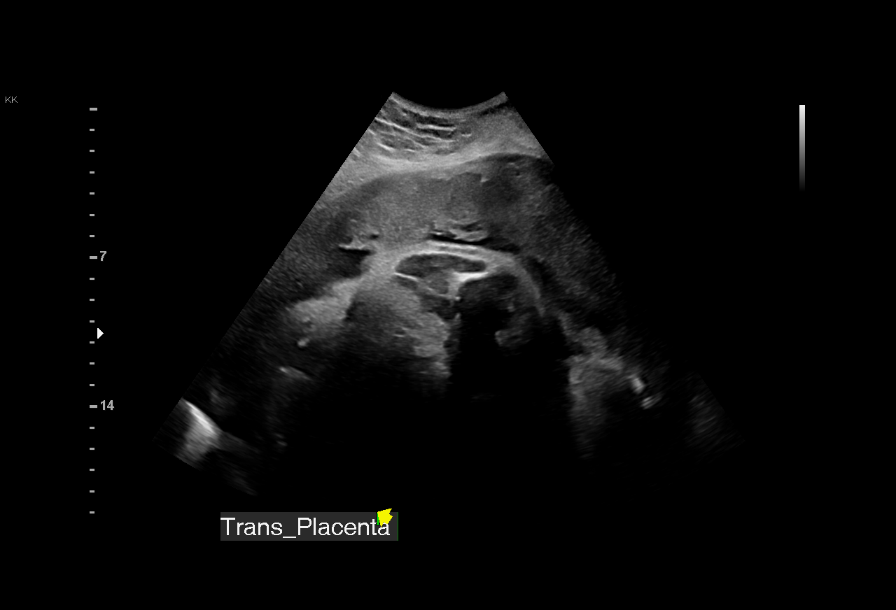
[im 7/35]
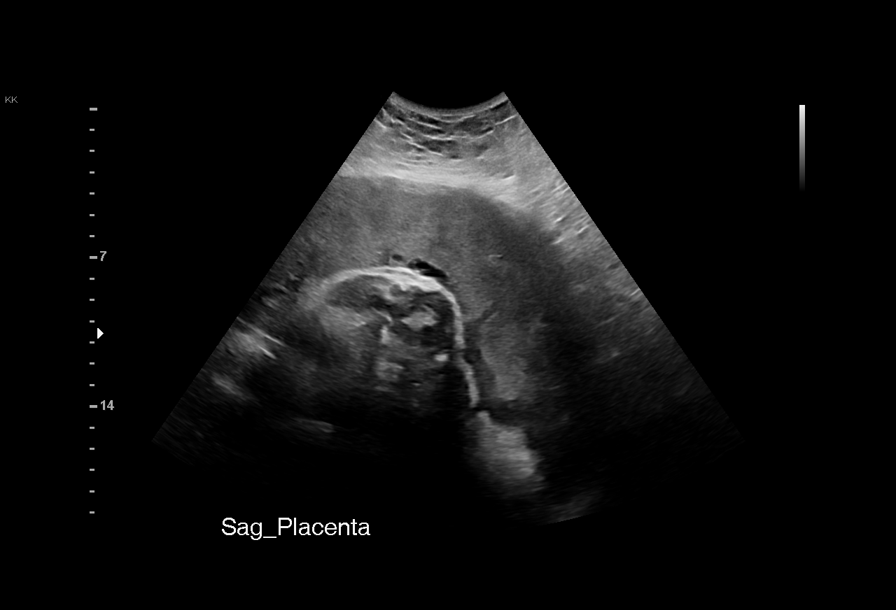
[im 9/35]
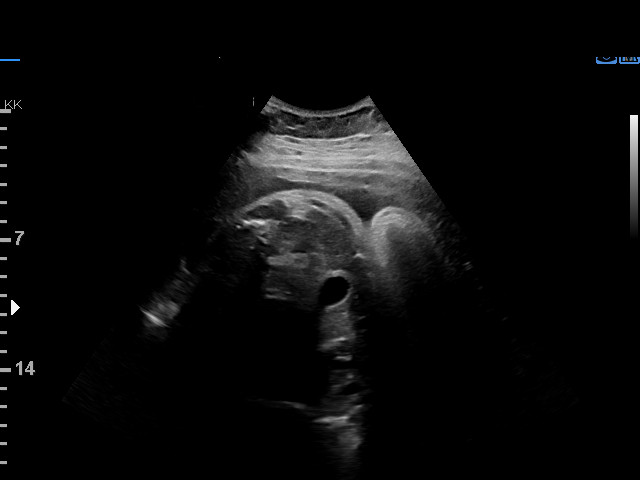
[im 12/35]
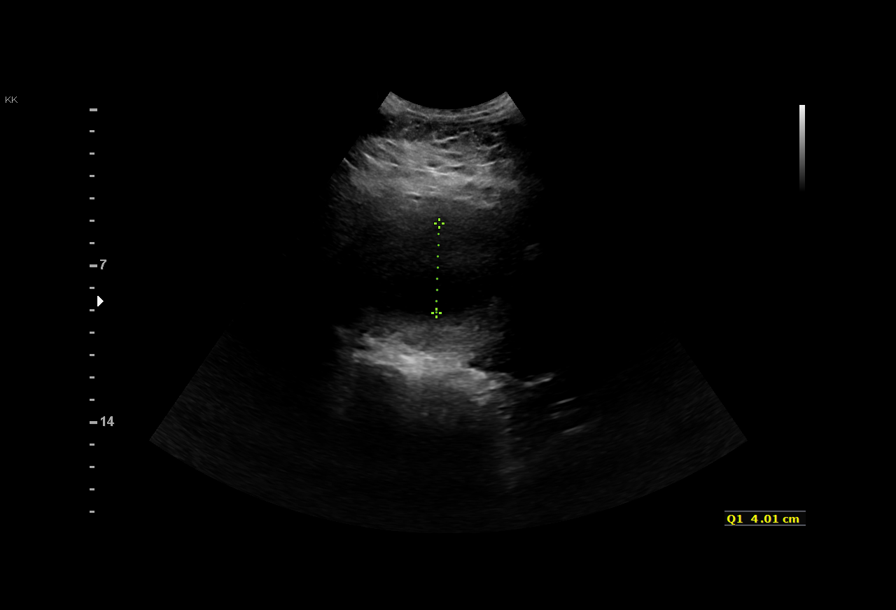
[im 14/35]
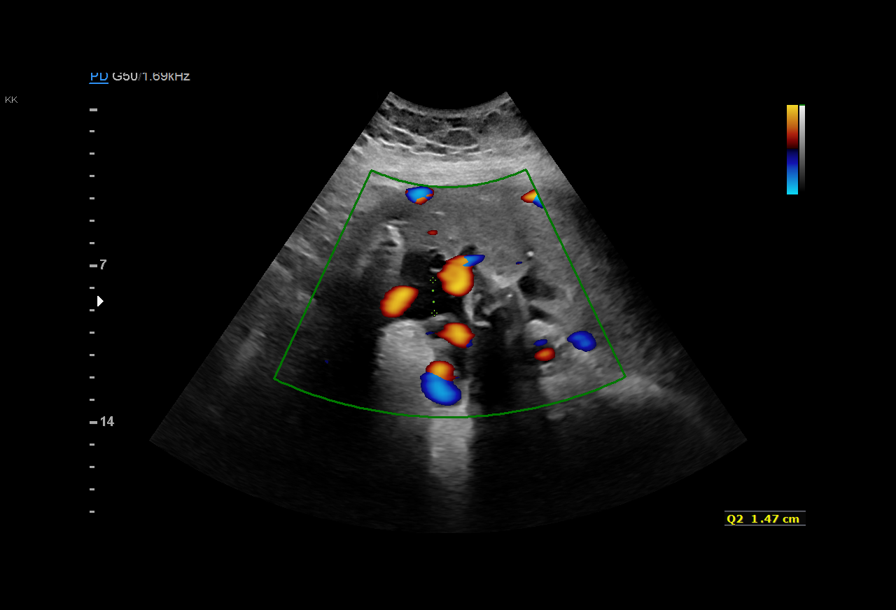
[im 18/35]
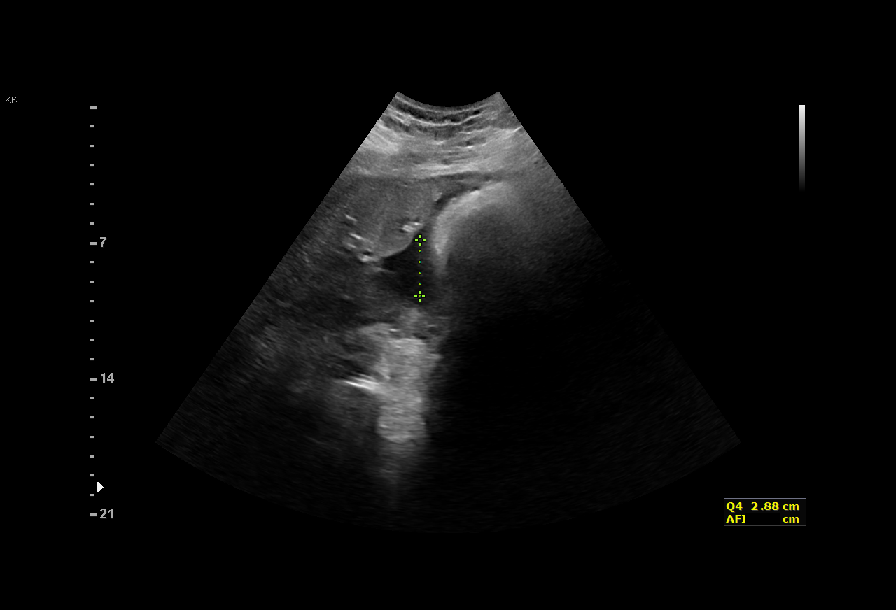
[im 21/35]
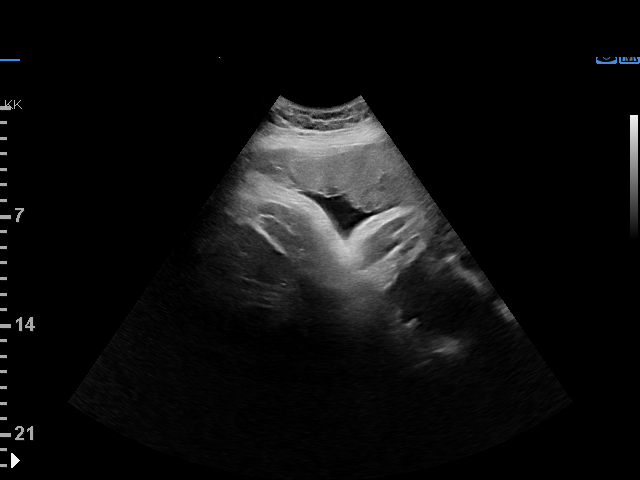
[im 23/35]
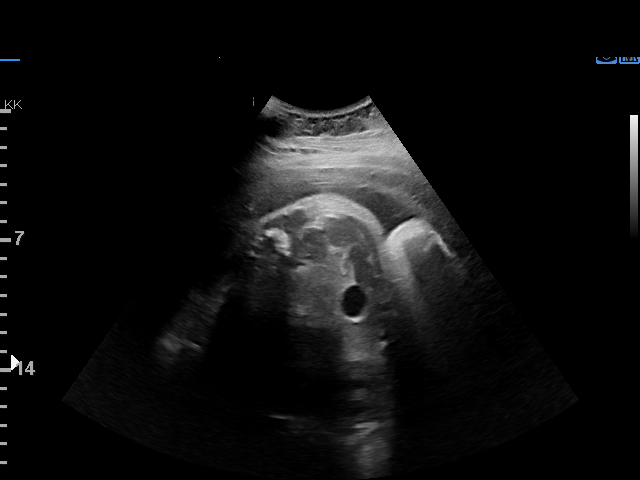
[im 26/35]
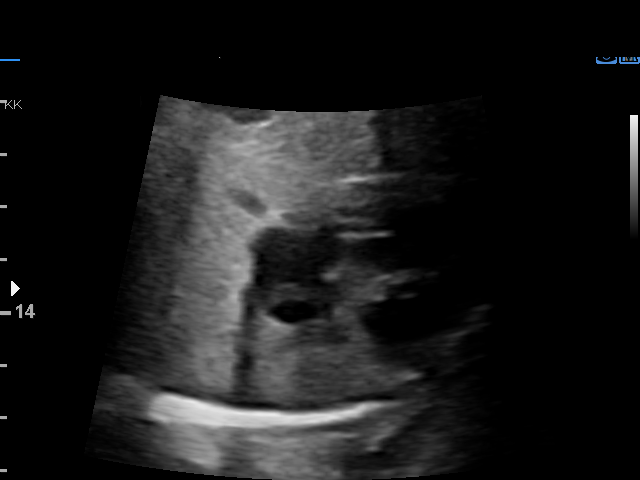
[im 28/35]
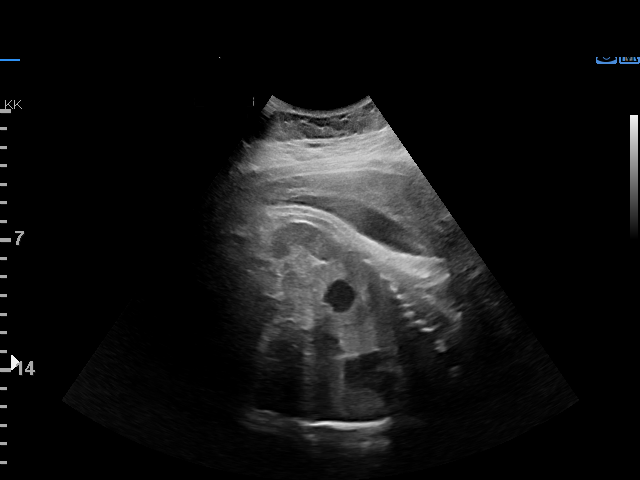
[im 31/35]
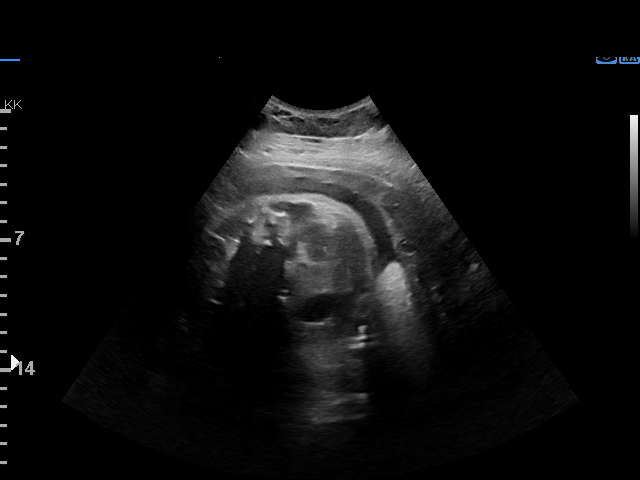
[im 33/35]
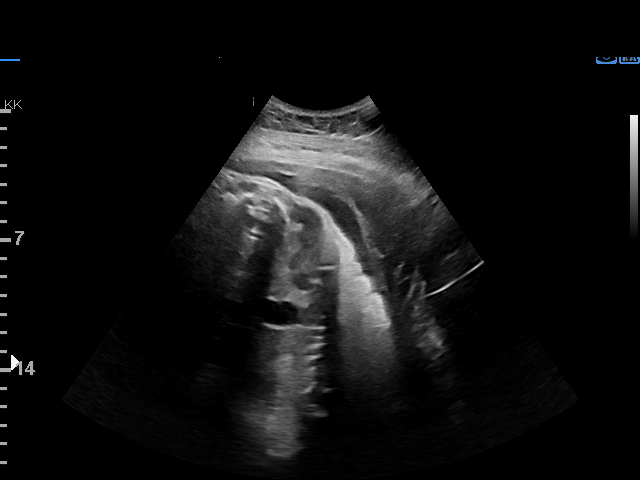

[13 of 28 positions shown; findings below may reference images not displayed]

Care

Indications

 37 weeks gestation of pregnancy
 Gestational diabetes in pregnancy,             [FL]
 controlled by oral hypoglycemic drugs
 Obesity complicating pregnancy, third          [FL]
 trimester
 Isoimmunization - Rh                           [FL]
 Silent PER-AAGE
Fetal Evaluation

 Num Of Fetuses:         1
 Fetal Heart Rate(bpm):  135
 Cardiac Activity:       Observed
 Presentation:           Cephalic
 Placenta:               Anterior
 P. Cord Insertion:      Previously Visualized

 Amniotic Fluid
 AFI FV:      Within normal limits

 AFI Sum(cm)     %Tile       Largest Pocket(cm)
 9.2             19          4.

 RUQ(cm)       RLQ(cm)       LUQ(cm)        LLQ(cm)
 4
Biophysical Evaluation
 Amniotic F.V:   Pocket => 2 cm             F. Tone:        Observed
 F. Movement:    Observed                   Score:          [DATE]
 F. Breathing:   Observed
OB History

 Gravidity:    1
Gestational Age

 LMP:           37w 4d        Date:  [DATE]                 EDD:   [DATE]
 Best:          37w 4d     Det. By:  LMP  ([DATE])          EDD:   [DATE]
Impression

 Antenatal testing given [FL]
 Biophysical profile [DATE] with good fetal movement and
 amniotic fluid volume
 She reports good overal blood glucose measurements. She
 is scheduled for delivery on [DATE] per her report.
Recommendations

 Continue weekly testing
 Consider delivery by 39 weeks.

## 2019-11-06 NOTE — Telephone Encounter (Signed)
Preadmission screen  

## 2019-11-07 ENCOUNTER — Telehealth (HOSPITAL_COMMUNITY): Payer: Self-pay | Admitting: *Deleted

## 2019-11-07 NOTE — Telephone Encounter (Signed)
Preadmission screen  

## 2019-11-09 ENCOUNTER — Other Ambulatory Visit: Payer: Self-pay | Admitting: Women's Health

## 2019-11-10 ENCOUNTER — Telehealth (HOSPITAL_COMMUNITY): Payer: Self-pay | Admitting: *Deleted

## 2019-11-10 NOTE — Telephone Encounter (Signed)
Preadmission screen  

## 2019-11-11 ENCOUNTER — Other Ambulatory Visit: Payer: Self-pay | Admitting: Obstetrics and Gynecology

## 2019-11-11 ENCOUNTER — Telehealth (HOSPITAL_COMMUNITY): Payer: Self-pay | Admitting: *Deleted

## 2019-11-11 NOTE — Progress Notes (Signed)
IOL orders placed 

## 2019-11-11 NOTE — Addendum Note (Signed)
Addended by: Warden Fillers on: 11/11/2019 12:45 PM   Modules accepted: Orders, SmartSet

## 2019-11-11 NOTE — Telephone Encounter (Signed)
Preadmission screen  

## 2019-11-12 ENCOUNTER — Encounter (HOSPITAL_COMMUNITY): Payer: Self-pay | Admitting: *Deleted

## 2019-11-12 ENCOUNTER — Telehealth (HOSPITAL_COMMUNITY): Payer: Self-pay | Admitting: *Deleted

## 2019-11-12 NOTE — Telephone Encounter (Signed)
Preadmission screen  

## 2019-11-13 ENCOUNTER — Other Ambulatory Visit: Payer: Self-pay

## 2019-11-13 ENCOUNTER — Ambulatory Visit: Payer: Medicaid Other | Admitting: *Deleted

## 2019-11-13 ENCOUNTER — Ambulatory Visit (INDEPENDENT_AMBULATORY_CARE_PROVIDER_SITE_OTHER): Payer: Medicaid Other | Admitting: Obstetrics

## 2019-11-13 ENCOUNTER — Ambulatory Visit: Payer: Medicaid Other | Attending: Obstetrics and Gynecology

## 2019-11-13 ENCOUNTER — Encounter: Payer: Self-pay | Admitting: Obstetrics

## 2019-11-13 ENCOUNTER — Other Ambulatory Visit: Payer: Self-pay | Admitting: Obstetrics

## 2019-11-13 VITALS — BP 119/85 | HR 104 | Wt 246.0 lb

## 2019-11-13 DIAGNOSIS — E669 Obesity, unspecified: Secondary | ICD-10-CM | POA: Diagnosis not present

## 2019-11-13 DIAGNOSIS — O24419 Gestational diabetes mellitus in pregnancy, unspecified control: Secondary | ICD-10-CM | POA: Diagnosis present

## 2019-11-13 DIAGNOSIS — D563 Thalassemia minor: Secondary | ICD-10-CM | POA: Insufficient documentation

## 2019-11-13 DIAGNOSIS — A5901 Trichomonal vulvovaginitis: Secondary | ICD-10-CM | POA: Insufficient documentation

## 2019-11-13 DIAGNOSIS — O99210 Obesity complicating pregnancy, unspecified trimester: Secondary | ICD-10-CM | POA: Insufficient documentation

## 2019-11-13 DIAGNOSIS — O24415 Gestational diabetes mellitus in pregnancy, controlled by oral hypoglycemic drugs: Secondary | ICD-10-CM

## 2019-11-13 DIAGNOSIS — O99213 Obesity complicating pregnancy, third trimester: Secondary | ICD-10-CM | POA: Diagnosis not present

## 2019-11-13 DIAGNOSIS — Z3A38 38 weeks gestation of pregnancy: Secondary | ICD-10-CM | POA: Diagnosis not present

## 2019-11-13 DIAGNOSIS — O361930 Maternal care for other isoimmunization, third trimester, not applicable or unspecified: Secondary | ICD-10-CM

## 2019-11-13 DIAGNOSIS — O0990 Supervision of high risk pregnancy, unspecified, unspecified trimester: Secondary | ICD-10-CM

## 2019-11-13 DIAGNOSIS — O0993 Supervision of high risk pregnancy, unspecified, third trimester: Secondary | ICD-10-CM

## 2019-11-13 IMAGING — US US MFM FETAL BPP W/ NON-STRESS
1 series · 15 of 28 positions shown · non-contrast
Comparison: none

[Series 1: us mfm fetal bpp w/ non-stress · 41 acquisitions, 15 frames shown]
[im 1/41]
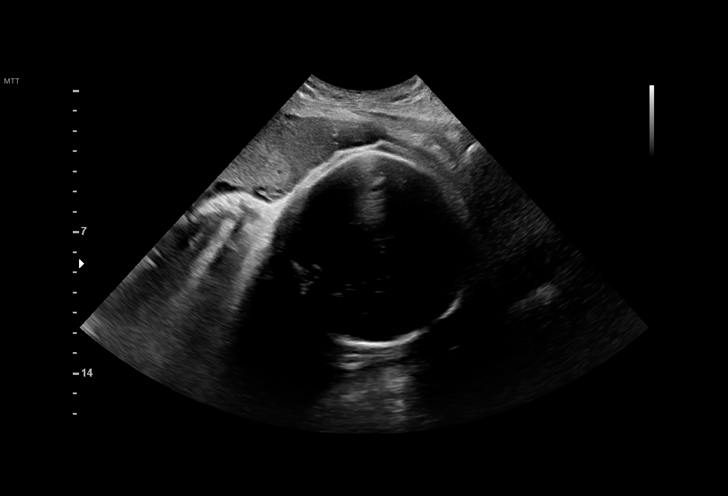
[im 3/41]
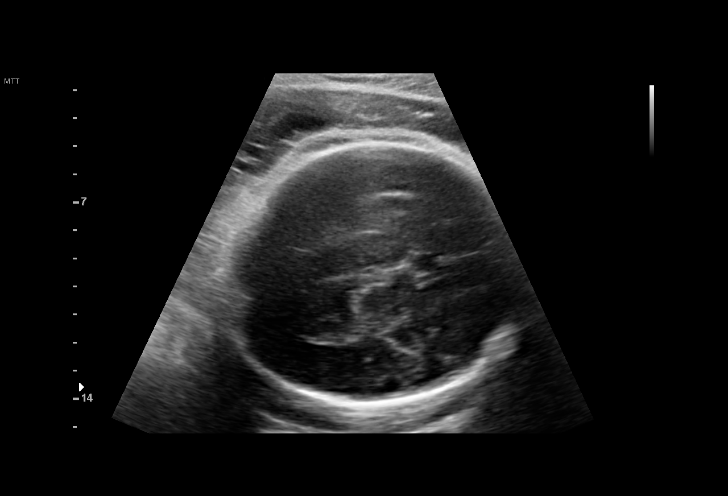
[im 6/41]
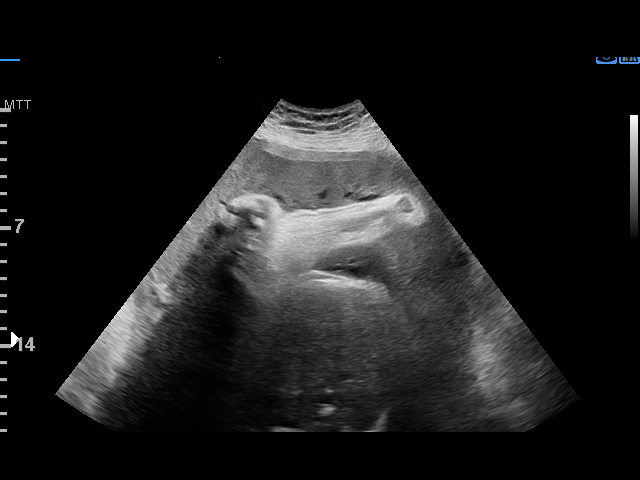
[im 9/41]
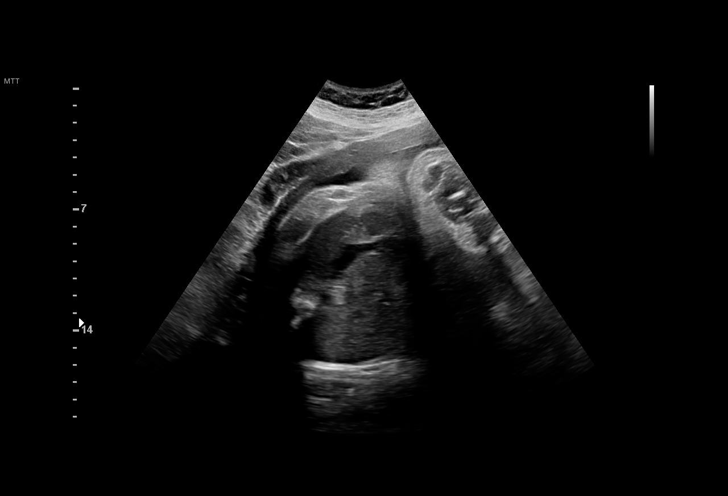
[im 12/41]
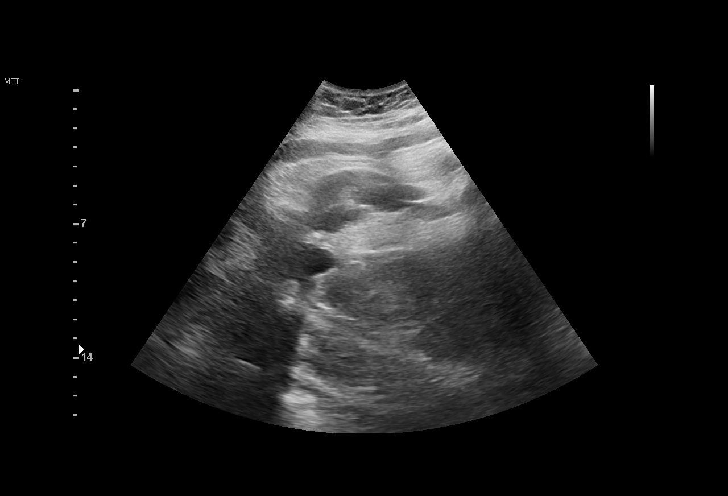
[im 15/41]
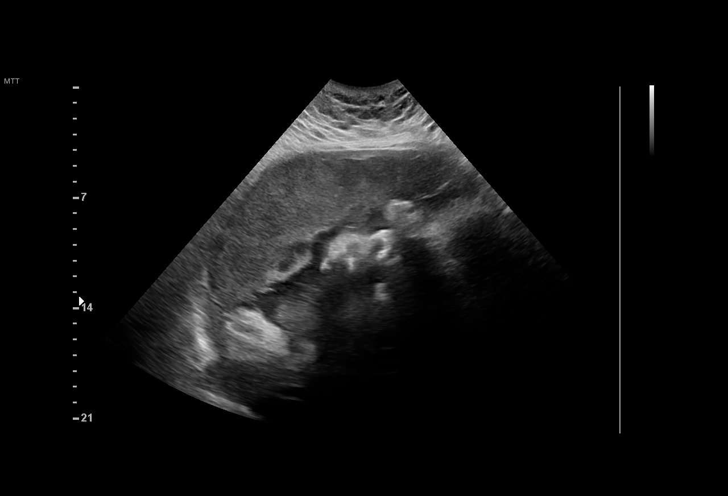
[im 18/41]
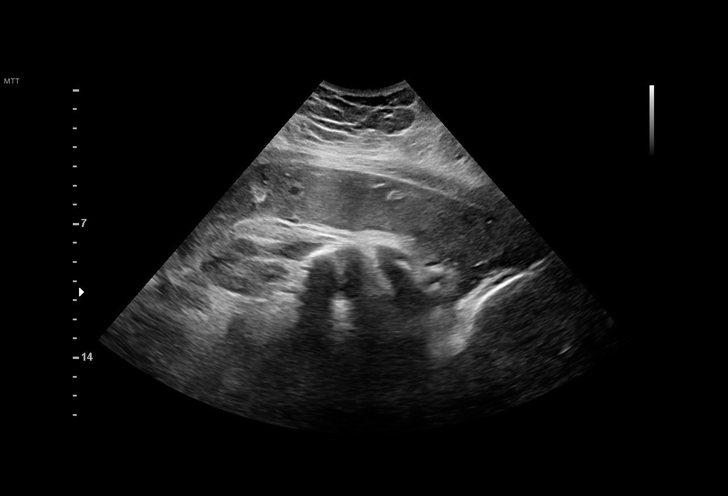
[im 21/41]
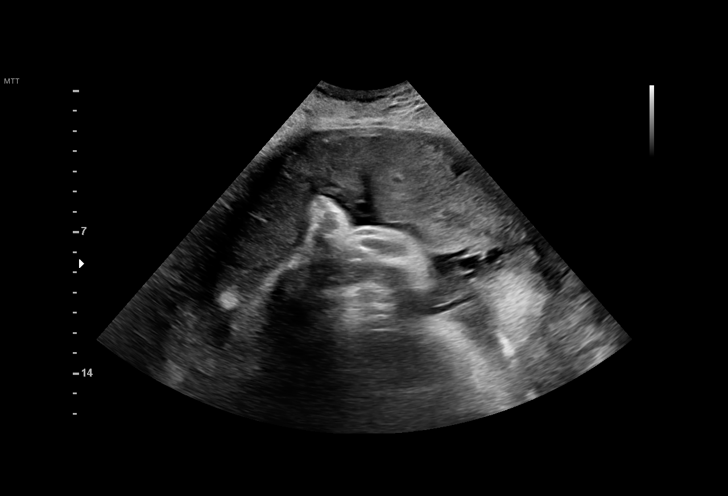
[im 23/41]
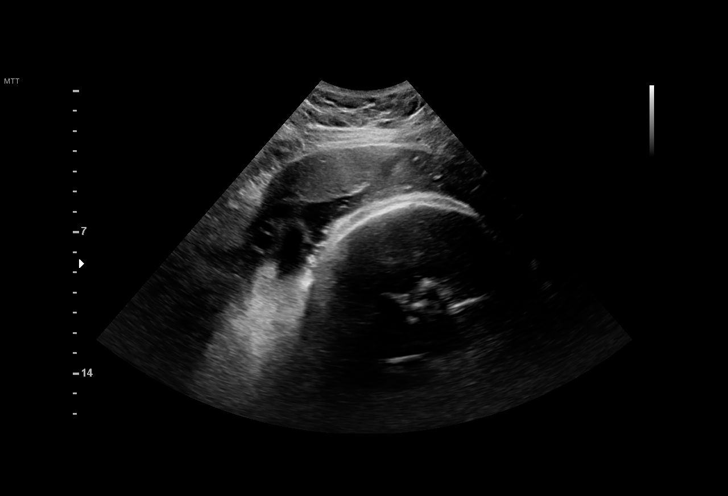
[im 26/41]
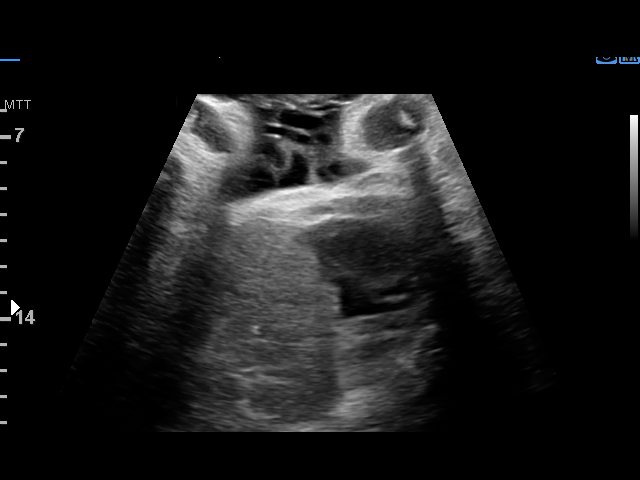
[im 29/41]
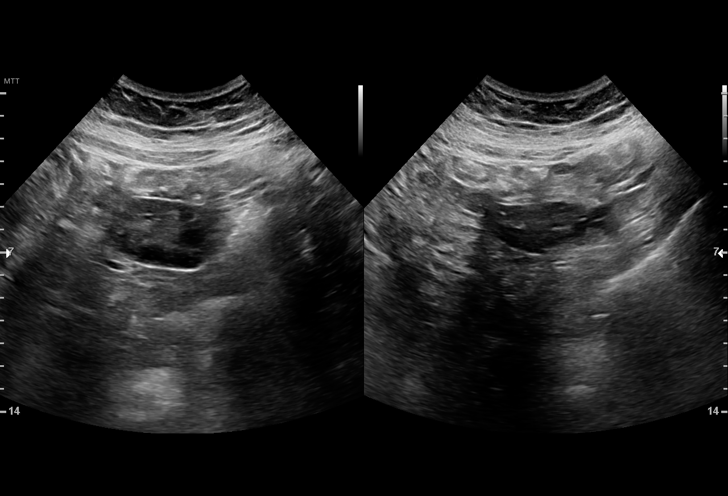
[im 32/41]
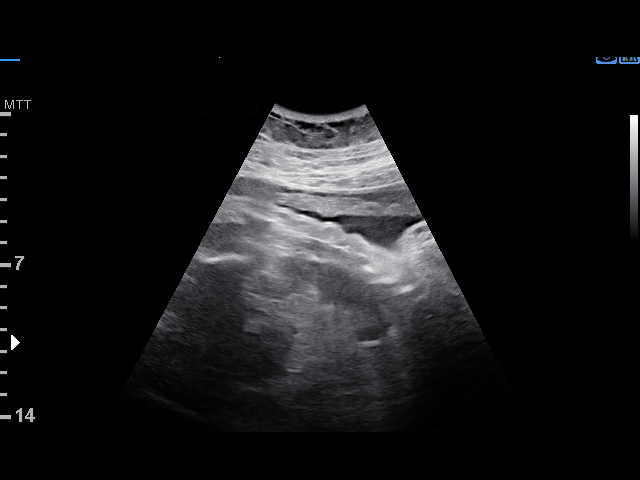
[im 35/41]
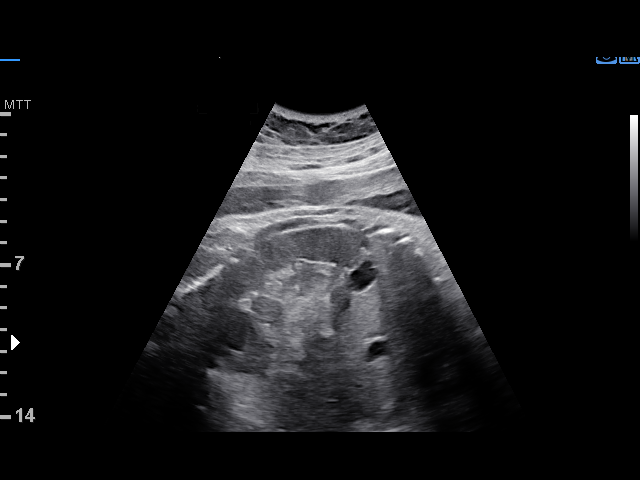
[im 38/41]
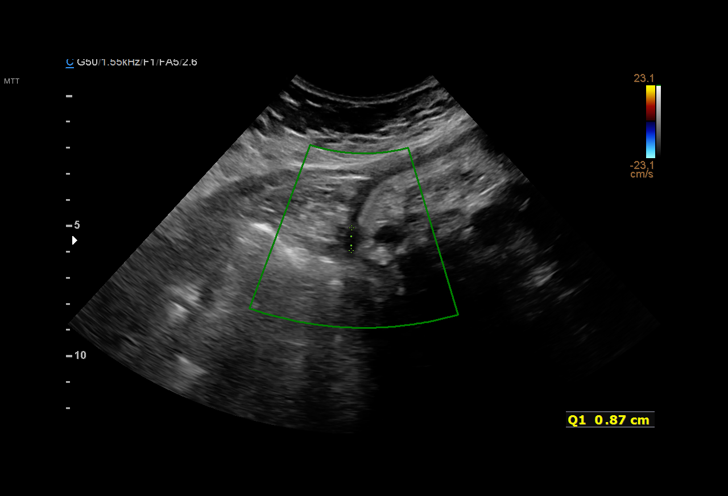
[im 41/41]
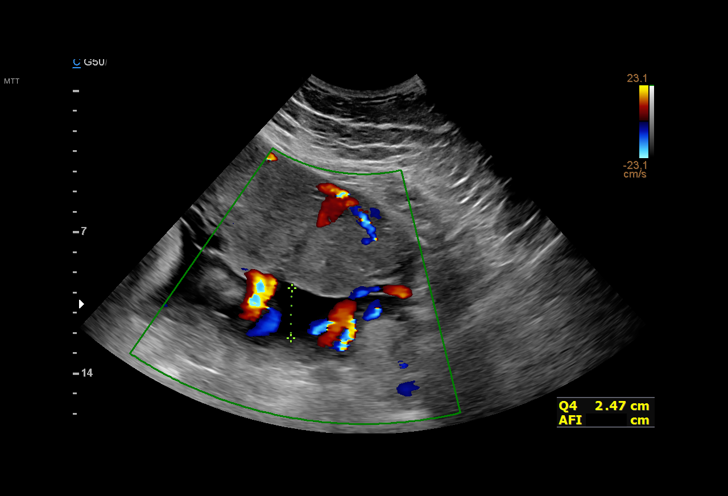

[15 of 28 positions shown; findings below may reference images not displayed]

Care

    W/NONSTRESS

Indications

 38 weeks gestation of pregnancy
 Gestational diabetes in pregnancy,             [HT]
 controlled by oral hypoglycemic drugs
 Obesity complicating pregnancy, third          [HT]
 trimester
 Isoimmunization - Rh                           [HT]
 Silent CATO
Fetal Evaluation

 Num Of Fetuses:         1
 Fetal Heart Rate(bpm):  136
 Cardiac Activity:       Observed
 Presentation:           Cephalic
 Placenta:               Anterior
 P. Cord Insertion:      Previously Visualized

 Amniotic Fluid
 AFI FV:      Within normal limits

 AFI Sum(cm)     %Tile       Largest Pocket(cm)
 11.5            39

 RUQ(cm)       RLQ(cm)       LUQ(cm)        LLQ(cm)

Biophysical Evaluation
 Amniotic F.V:   Pocket => 2 cm             F. Tone:        Observed
 F. Movement:    Observed                   N.S.T:          Reactive
 F. Breathing:   Not Observed               Score:          [DATE]
Biometry

 LV:        2.7  mm
OB History

 Gravidity:    1
Gestational Age

 LMP:           38w 4d        Date:  [DATE]                 EDD:   [DATE]
 Best:          38w 4d     Det. By:  LMP  ([DATE])          EDD:   [DATE]
Anatomy

 Ventricles:            Appears normal         Kidneys:                Appear normal
 Diaphragm:             Appears normal         Bladder:                Appears normal
 Stomach:               Appears normal, left
                        sided
Cervix Uterus Adnexa

 Cervix
 Not visualized (advanced GA >[HT])

 Uterus
 No abnormality visualized.

 Right Ovary
 Within normal limits. No adnexal mass visualized.

 Left Ovary
 Within normal limits. No adnexal mass visualized.

 Cul De Sac
 No free fluid seen.

 Adnexa
 No abnormality visualized.
Impression

 Gestational diabetes.  Patient takes Metformin for control.
 Amniotic fluid is normal and good fetal activity seen.
 Cephalic presentation.  Fetal breathing movements did not
 meet the criteria of BPP.  NST is reactive.  BPP [DATE].  We
 reassured the patient of the findings.
 She will be undergoing induction of labor on [DATE].
                 CATO

## 2019-11-13 NOTE — Procedures (Signed)
Martha Hall 1995/08/06 [redacted]w[redacted]d  Fetus A Non-Stress Test Interpretation for 11/13/19  Indication: Unsatisfactory BPP  Fetal Heart Rate A Mode: External Baseline Rate (A): 130 bpm Variability: Moderate Accelerations: 15 x 15 Decelerations: None Multiple birth?: No  Uterine Activity Mode: Palpation, Toco Contraction Frequency (min): 9 Contraction Duration (sec): 50-140 Contraction Quality: Mild Resting Tone Palpated: Relaxed Resting Time: Adequate  Interpretation (Fetal Testing) Nonstress Test Interpretation: Reactive Comments: Reviewed tracing with Dr. Judeth Cornfield

## 2019-11-13 NOTE — Progress Notes (Signed)
Subjective:  Martha Hall is a 24 y.o. G1P0000 at [redacted]w[redacted]d being seen today for ongoing prenatal care.  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  Patient reports no complaints.  Contractions: Irritability. Vag. Bleeding: None.  Movement: Present. Denies leaking of fluid.   The following portions of the patient's history were reviewed and updated as appropriate: allergies, current medications, past family history, past medical history, past social history, past surgical history and problem list. Problem list updated.  Objective:   Vitals:   11/13/19 1033  BP: 119/85  Pulse: (!) 104  Weight: 246 lb (111.6 kg)    Fetal Status:     Movement: Present     General:  Alert, oriented and cooperative. Patient is in no acute distress.  Skin: Skin is warm and dry. No rash noted.   Cardiovascular: Normal heart rate noted  Respiratory: Normal respiratory effort, no problems with respiration noted  Abdomen: Soft, gravid, appropriate for gestational age. Pain/Pressure: Present     Pelvic:  Cervical exam deferred        Extremities: Normal range of motion.     Mental Status: Normal mood and affect. Normal behavior. Normal judgment and thought content.   Urinalysis:      Assessment and Plan:  Pregnancy: G1P0000 at [redacted]w[redacted]d  1. Supervision of high risk pregnancy, antepartum  2. Gestational diabetes mellitus (GDM) in third trimester controlled on oral hypoglycemic drug - glucose well  controlled  3. Obesity in pregnancy   Term labor symptoms and general obstetric precautions including but not limited to vaginal bleeding, contractions, leaking of fluid and fetal movement were reviewed in detail with the patient. Please refer to After Visit Summary for other counseling recommendations.   Return in about 4 weeks (around 12/11/2019) for postpartum visit.   Brock Bad, MD  11/13/19

## 2019-11-14 ENCOUNTER — Other Ambulatory Visit (HOSPITAL_COMMUNITY)
Admission: RE | Admit: 2019-11-14 | Discharge: 2019-11-14 | Disposition: A | Discharge status: Home / Self Care | Payer: Medicaid Other | Source: Ambulatory Visit | Attending: Family Medicine | Admitting: Family Medicine

## 2019-11-14 DIAGNOSIS — Z01812 Encounter for preprocedural laboratory examination: Secondary | ICD-10-CM | POA: Insufficient documentation

## 2019-11-14 DIAGNOSIS — Z20822 Contact with and (suspected) exposure to covid-19: Secondary | ICD-10-CM | POA: Diagnosis not present

## 2019-11-14 LAB — SARS CORONAVIRUS 2 (TAT 6-24 HRS): SARS Coronavirus 2: NEGATIVE

## 2019-11-15 ENCOUNTER — Other Ambulatory Visit: Payer: Self-pay | Admitting: Advanced Practice Midwife

## 2019-11-16 ENCOUNTER — Inpatient Hospital Stay (HOSPITAL_COMMUNITY)
Admission: AD | Admit: 2019-11-16 | Discharge: 2019-11-20 | DRG: 807 | Disposition: A | Discharge status: Home / Self Care | Payer: Medicaid Other | Attending: Obstetrics & Gynecology | Admitting: Obstetrics & Gynecology

## 2019-11-16 ENCOUNTER — Other Ambulatory Visit: Payer: Self-pay | Admitting: Obstetrics

## 2019-11-16 ENCOUNTER — Encounter (HOSPITAL_COMMUNITY): Payer: Self-pay | Admitting: Obstetrics and Gynecology

## 2019-11-16 ENCOUNTER — Inpatient Hospital Stay (HOSPITAL_COMMUNITY): Payer: Medicaid Other

## 2019-11-16 ENCOUNTER — Other Ambulatory Visit: Payer: Self-pay | Admitting: Advanced Practice Midwife

## 2019-11-16 DIAGNOSIS — O99214 Obesity complicating childbirth: Secondary | ICD-10-CM | POA: Diagnosis present

## 2019-11-16 DIAGNOSIS — O361930 Maternal care for other isoimmunization, third trimester, not applicable or unspecified: Secondary | ICD-10-CM | POA: Diagnosis present

## 2019-11-16 DIAGNOSIS — O98819 Other maternal infectious and parasitic diseases complicating pregnancy, unspecified trimester: Secondary | ICD-10-CM | POA: Diagnosis present

## 2019-11-16 DIAGNOSIS — Z3A39 39 weeks gestation of pregnancy: Secondary | ICD-10-CM | POA: Diagnosis not present

## 2019-11-16 DIAGNOSIS — O24415 Gestational diabetes mellitus in pregnancy, controlled by oral hypoglycemic drugs: Secondary | ICD-10-CM | POA: Diagnosis present

## 2019-11-16 DIAGNOSIS — O24425 Gestational diabetes mellitus in childbirth, controlled by oral hypoglycemic drugs: Principal | ICD-10-CM | POA: Diagnosis present

## 2019-11-16 DIAGNOSIS — O99210 Obesity complicating pregnancy, unspecified trimester: Secondary | ICD-10-CM | POA: Diagnosis present

## 2019-11-16 DIAGNOSIS — D563 Thalassemia minor: Secondary | ICD-10-CM | POA: Diagnosis present

## 2019-11-16 DIAGNOSIS — O0993 Supervision of high risk pregnancy, unspecified, third trimester: Secondary | ICD-10-CM

## 2019-11-16 DIAGNOSIS — O361990 Maternal care for other isoimmunization, unspecified trimester, not applicable or unspecified: Secondary | ICD-10-CM | POA: Diagnosis present

## 2019-11-16 DIAGNOSIS — Z6841 Body Mass Index (BMI) 40.0 and over, adult: Secondary | ICD-10-CM

## 2019-11-16 DIAGNOSIS — A749 Chlamydial infection, unspecified: Secondary | ICD-10-CM | POA: Diagnosis present

## 2019-11-16 DIAGNOSIS — E669 Obesity, unspecified: Secondary | ICD-10-CM | POA: Diagnosis not present

## 2019-11-16 LAB — COMPREHENSIVE METABOLIC PANEL
ALT: 14 U/L (ref 0–44)
AST: 16 U/L (ref 15–41)
Albumin: 2.7 g/dL — ABNORMAL LOW (ref 3.5–5.0)
Alkaline Phosphatase: 271 U/L — ABNORMAL HIGH (ref 38–126)
Anion gap: 12 (ref 5–15)
BUN: 8 mg/dL (ref 6–20)
CO2: 17 mmol/L — ABNORMAL LOW (ref 22–32)
Calcium: 9.7 mg/dL (ref 8.9–10.3)
Chloride: 106 mmol/L (ref 98–111)
Creatinine, Ser: 0.66 mg/dL (ref 0.44–1.00)
GFR calc Af Amer: >60 mL/min (ref >60)
GFR calc non Af Amer: >60 mL/min (ref >60)
Glucose, Bld: 124 mg/dL — ABNORMAL HIGH (ref 70–99)
Potassium: 4 mmol/L (ref 3.5–5.1)
Sodium: 135 mmol/L (ref 135–145)
Total Bilirubin: 0.4 mg/dL (ref 0.3–1.2)
Total Protein: 6.4 g/dL — ABNORMAL LOW (ref 6.5–8.1)

## 2019-11-16 LAB — TYPE AND SCREEN
ABO/RH(D): A POS
Antibody Screen: NEGATIVE

## 2019-11-16 LAB — CBC
HCT: 37.9 % (ref 36.0–46.0)
Hemoglobin: 12.1 g/dL (ref 12.0–15.0)
MCH: 27.3 pg (ref 26.0–34.0)
MCHC: 31.9 g/dL (ref 30.0–36.0)
MCV: 85.6 fL (ref 80.0–100.0)
Platelets: 317 10*3/uL (ref 150–400)
RBC: 4.43 MIL/uL (ref 3.87–5.11)
RDW: 13.6 % (ref 11.5–15.5)
WBC: 6.8 10*3/uL (ref 4.0–10.5)
nRBC: 0 % (ref 0.0–0.2)

## 2019-11-16 LAB — GLUCOSE, CAPILLARY
Glucose-Capillary: 105 mg/dL — ABNORMAL HIGH (ref 70–99)
Glucose-Capillary: 63 mg/dL — ABNORMAL LOW (ref 70–99)
Glucose-Capillary: 70 mg/dL (ref 70–99)
Glucose-Capillary: 106 mg/dL — ABNORMAL HIGH (ref 70–99)
Glucose-Capillary: 101 mg/dL — ABNORMAL HIGH (ref 70–99)

## 2019-11-16 MED ORDER — EPHEDRINE 5 MG/ML INJ: 10.0000 mg | INTRAVENOUS | Status: DC | PRN

## 2019-11-16 MED ORDER — SOD CITRATE-CITRIC ACID 500-334 MG/5ML PO SOLN: 30.0000 mL | ORAL | Status: DC | PRN

## 2019-11-16 MED ORDER — OXYCODONE-ACETAMINOPHEN 5-325 MG PO TABS: 1.0000 | ORAL_TABLET | ORAL | Status: DC | PRN

## 2019-11-16 MED ORDER — LACTATED RINGERS IV SOLN
500.0000 mL | Freq: Once | INTRAVENOUS | Status: AC
  Administered 2019-11-17: 500 mL via INTRAVENOUS

## 2019-11-16 MED ORDER — LIDOCAINE HCL (PF) 1 % IJ SOLN: 30.0000 mL | INTRAMUSCULAR | Status: DC | PRN

## 2019-11-16 MED ORDER — ACETAMINOPHEN 325 MG PO TABS: 650.0000 mg | ORAL_TABLET | ORAL | Status: DC | PRN

## 2019-11-16 MED ORDER — PHENYLEPHRINE 40 MCG/ML (10ML) SYRINGE FOR IV PUSH (FOR BLOOD PRESSURE SUPPORT): 80.0000 ug | PREFILLED_SYRINGE | INTRAVENOUS | Status: DC | PRN

## 2019-11-16 MED ORDER — LACTATED RINGERS IV SOLN: INTRAVENOUS | Status: DC

## 2019-11-16 MED ORDER — MISOPROSTOL 25 MCG QUARTER TABLET
25.0000 ug | ORAL_TABLET | ORAL | Status: DC | PRN
  Administered 2019-11-16 – 2019-11-17 (×4): 25 ug via VAGINAL
  Filled 2019-11-16 (×5): qty 1

## 2019-11-16 MED ORDER — FENTANYL-BUPIVACAINE-NACL 0.5-0.125-0.9 MG/250ML-% EP SOLN
12.0000 mL/h | EPIDURAL | Status: DC | PRN
  Administered 2019-11-17: 12 mL/h via EPIDURAL
  Filled 2019-11-16 (×2): qty 250

## 2019-11-16 MED ORDER — OXYTOCIN-SODIUM CHLORIDE 30-0.9 UT/500ML-% IV SOLN
2.5000 [IU]/h | INTRAVENOUS | Status: DC
  Filled 2019-11-16: qty 500

## 2019-11-16 MED ORDER — LACTATED RINGERS IV SOLN
500.0000 mL | INTRAVENOUS | Status: DC | PRN
  Administered 2019-11-17 – 2019-11-18 (×3): 500 mL via INTRAVENOUS

## 2019-11-16 MED ORDER — ONDANSETRON HCL 4 MG/2ML IJ SOLN: 4.0000 mg | Freq: Four times a day (QID) | INTRAMUSCULAR | Status: DC | PRN

## 2019-11-16 MED ORDER — OXYCODONE-ACETAMINOPHEN 5-325 MG PO TABS: 2.0000 | ORAL_TABLET | ORAL | Status: DC | PRN

## 2019-11-16 MED ORDER — OXYTOCIN BOLUS FROM INFUSION
333.0000 mL | Freq: Once | INTRAVENOUS | Status: AC
  Administered 2019-11-18: 333 mL via INTRAVENOUS

## 2019-11-16 MED ORDER — DEXTROSE-NACL 5-0.45 % IV SOLN: INTRAVENOUS | Status: DC

## 2019-11-16 MED ORDER — PHENYLEPHRINE 40 MCG/ML (10ML) SYRINGE FOR IV PUSH (FOR BLOOD PRESSURE SUPPORT)
80.0000 ug | PREFILLED_SYRINGE | INTRAVENOUS | Status: DC | PRN
  Filled 2019-11-16: qty 10

## 2019-11-16 MED ORDER — FENTANYL CITRATE (PF) 100 MCG/2ML IJ SOLN
50.0000 ug | INTRAMUSCULAR | Status: DC | PRN
  Administered 2019-11-16 (×2): 100 ug via INTRAVENOUS
  Filled 2019-11-16 (×2): qty 2

## 2019-11-16 MED ORDER — DIPHENHYDRAMINE HCL 50 MG/ML IJ SOLN: 12.5000 mg | INTRAMUSCULAR | Status: DC | PRN

## 2019-11-16 MED ORDER — TERBUTALINE SULFATE 1 MG/ML IJ SOLN: 0.2500 mg | Freq: Once | INTRAMUSCULAR | Status: DC | PRN

## 2019-11-16 NOTE — Progress Notes (Signed)
Martha Hall is a 24 y.o. G1P0000 at [redacted]w[redacted]d admitted for IOL 2/2 A2GDM (metformin)  Subjective: Resting comfortably, contractions not very painful  Objective: BP 123/80   Pulse 91   Temp 98 F (36.7 C) (Oral)   Resp 17   Ht 5\' 1"  (1.549 m)   Wt 110.5 kg   LMP 02/16/2019   BMI 46.01 kg/m  No intake/output data recorded.  FHT:  FHR: 115-120 bpm, variability: moderate,  accelerations:  Present,  decelerations:  Absent UC:   irregular, every 2-5 minutes  SVE:   Dilation: 4 Effacement (%): 50 Station: -2 Exam by:: MScott, RN   Labs: Lab Results  Component Value Date   WBC 6.8 11/16/2019   HGB 12.1 11/16/2019   HCT 37.9 11/16/2019   MCV 85.6 11/16/2019   PLT 317 11/16/2019    Assessment / Plan: 24 y.o. G1P0000 at [redacted]w[redacted]d admitted for IOL 2/2 A2GDM (metformin)  Labor: S/p cyto x2, FB. 3rd cyto placed vaginally at this exam 2/2 Bishop score 6. Consider more cyto v pit next check. Fetal Wellbeing:  Category I Pain Control:  per patient request A2GDM: CBG q4h, q2h when active. Most recent 63>70>106>101 I/D:  GBS neg Anticipated MOD:  vaginal  Martha Eggleton L Felix Meras DO OB Fellow, Faculty [facility] 11/16/2019, 11:47 PM

## 2019-11-16 NOTE — Progress Notes (Addendum)
Marwah Granato is a 24 y.o. G1P0000 at [redacted]w[redacted]d  Subjective: Pt tolerating labor well; ordering light meal   Objective: BP 126/72   Pulse 86   Temp 98.7 F (37.1 C) (Oral)   Resp 16   Ht 5\' 1"  (1.549 m)   Wt 110.5 kg   LMP 02/16/2019   BMI 46.01 kg/m  No intake/output data recorded. No intake/output data recorded.  FHT:  FHR: 120 bpm, variability: moderate,  accelerations:  Present,  decelerations:  Absent UC:   irregular, every 1-4 minutes SVE:   Dilation: 1 Effacement (%): 50 Station: -2 Exam by:: stone rnc  Cooks foley placed by [email or web address], CNM, w/ 60 mL sterile water in uterine balloon; pt tolerated well  Labs: Lab Results  Component Value Date   WBC 6.8 11/16/2019   HGB 12.1 11/16/2019   HCT 37.9 11/16/2019   MCV 85.6 11/16/2019   PLT 317 11/16/2019    Assessment / Plan: Induction of labor due to gestational diabetes Expectant mgmt  Labor:  early latent phase Preeclampsia:   NA Fetal Wellbeing:  Category I Pain Control:  Labor support without medications I/D:   GBS- Anticipated MOD:  NSVD  01/17/2020 11/16/2019, 5:26 PM   Attestation of Supervision of Student:  I confirm that I have verified the information documented in the nurse midwife student's note and that I have also personally reperformed the history, physical exam and all medical decision making activities.  I have verified that all services and findings are accurately documented in this student's note; and I agree with management and plan as outlined in the documentation. I have also made any necessary editorial changes.    01/17/2020, CNM [name], [facility] 11/16/2019 6:24 PM

## 2019-11-16 NOTE — H&P (Signed)
Martha Hall is a 24 y.o. female G1P0000 with IUP at 65w0dby  UKoreapresenting for IOL for GDMA2 on metformin.  She reports positive fetal movement. She denies leakage of fluid or vaginal bleeding.  [facility]  Pregnancy complications:  - Past Medical History: Past Medical History:  Diagnosis Date  . GDM (gestational diabetes mellitus) 09/2019  . Gestational diabetes   . Hydrocephalus (HCC)    hx of  . Infection    UTI    Past Surgical History: Past Surgical History:  Procedure Laterality Date  . BRAIN SURGERY  003/14/1999  stint placed when born    Obstetrical History: OB History    Gravida  1   Para      Term      Preterm      AB  0   Living  0     SAB  0   TAB      Ectopic      Multiple      Live Births               Social History: Social History   Socioeconomic History  . Marital status: Single    Spouse name: Not on file  . Number of children: Not on file  . Years of education: Not on file  . Highest education level: Associate degree: occupational, tHotel manager or vocational program  Occupational History  . Not on file  Tobacco Use  . Smoking status: Never Smoker  . Smokeless tobacco: Never Used  Vaping Use  . Vaping Use: Never used  Substance and Sexual Activity  . Alcohol use: Never  . Drug use: Never  . Sexual activity: Not Currently  Other Topics Concern  . Not on file  Social History Narrative  . Not on file   Social Determinants of Health   Financial Resource Strain:   . Difficulty of Paying Living Expenses:   Food Insecurity:   . Worried About RCharity fundraiserin the Last Year:   . RArboriculturistin the Last Year:   Transportation Needs:   . LFilm/video editor(Medical):   .Marland KitchenLack of Transportation (Non-Medical):   Physical Activity:   . Days of Exercise per Week:   . Minutes of Exercise per Session:   Stress:   . Feeling of Stress :   Social Connections:    . Frequency of Communication with Friends and Family:   . Frequency of Social Gatherings with Friends and Family:   . Attends Religious Services:   . Active Member of Clubs or Organizations:   . Attends CArchivistMeetings:   .Marland KitchenMarital Status:     Family History: Family History  Problem Relation Age of Onset  . Healthy Mother   . Healthy Father     Allergies: No Known Allergies  Medications Prior to Admission  Medication Sig Dispense Refill Last Dose  . Accu-Chek Softclix Lancets lancets Use as instructed 100 each 12   . aspirin EC 81 MG tablet Take 1 tablet (81 mg total) by mouth daily. Take after 12 weeks for prevention of preeclampsia later in pregnancy 30 tablet 3   . Blood Glucose Monitoring Suppl (ACCU-CHEK GUIDE) w/Device KIT 1 each by Does not apply route in the morning, at noon, in the evening, and at bedtime. 1 kit 0   . Blood Pressure Monitoring (BLOOD PRESSURE KIT) DEVI 1 kit by Does not apply route  once a week. Check BP regularly and record readings into the Babyscripts App.  Large Cuff.  DX O90.0 1 each 0   . cyclobenzaprine (FLEXERIL) 10 MG tablet Take 1 tablet (10 mg total) by mouth 3 (three) times daily as needed for muscle spasms. (Patient not taking: Reported on 11/06/2019) 30 tablet 2   . Doxylamine-Pyridoxine (DICLEGIS) 10-10 MG TBEC Take 2 tablets at bedtime, may add 1 tablet at breakfast & 1 tablet at lunch if needed. (Patient not taking: Reported on 10/02/2019) 60 tablet 0   . famotidine (PEPCID) 20 MG tablet Take 1 tablet (20 mg total) by mouth 2 (two) times daily. 60 tablet 0   . glucose blood test strip Use 1 test strip to check glucose 4 times daily. 100 each 12   . metFORMIN (GLUCOPHAGE) 500 MG tablet Take 1 tablet (500 mg total) by mouth at bedtime. 30 tablet 5   . metoCLOPramide (REGLAN) 5 MG tablet Take 1 tablet (5 mg total) by mouth 3 (three) times daily before meals. 60 tablet 2   . pantoprazole (PROTONIX) 40 MG tablet Take 1 tablet (40 mg  total) by mouth daily. 30 tablet 1   . Prenatal Vit-Fe Fumarate-FA (PRENATAL VITAMIN) 27-0.8 MG TABS Take 1 tablet by mouth daily. 30 tablet 11     Review of Systems   Constitutional: Negative for fever and chills Eyes: Negative for visual disturbances Respiratory: Negative for shortness of breath, dyspnea Cardiovascular: Negative for chest pain or palpitations  Gastrointestinal: Negative for vomiting, diarrhea and constipation.  POSITIVE for abdominal pain (contractions) Genitourinary: Negative for dysuria and urgency Musculoskeletal: Negative for back pain, joint pain, myalgias  Neurological: Negative for dizziness and headaches  Last menstrual period 02/16/2019. General appearance: alert, cooperative and no distress Lungs: normal respiratory effort Heart: regular rate and rhythm Abdomen: soft, non-tender; bowel sounds normal Extremities: Homans sign is negative, no sign of DVT DTR's 2+ Presentation: cephalic by bedside US.  Fetal monitoring  Baseline: 120 bpm, mod var, no decels, present acel,  Uterine activity  q1-2     Prenatal labs: ABO, Rh: --/--/A POS (06/08 2022) Antibody: NEG (06/08 2022) Rubella: 15.20 (12/23 1051) RPR: Non Reactive (04/29 1017)  HBsAg: Negative (12/23 1051)  HIV: Non Reactive (04/29 1017)  GBS: Negative/-- (06/23 1109)  Glucola: 96 192 171 Genetic screening  Silent alpha thalassemia, low risk female Anatomy US normal female; [place] on 06/22, EFW 6 lbs 9oz in 61%  Prenatal Transfer Tool  Maternal Diabetes: Yes:  Diabetes Type:  Insulin/Medication controlled on metformin 500 mg Genetic Screening: Normal Maternal Ultrasounds/Referrals: Normal Fetal Ultrasounds or other Referrals:  [facility]erred to [facility] . Followed for growth Maternal Substance Abuse:  No Significant Maternal Medications:  Meds include: Other: metformin Significant Maternal Lab Results: Group B Strep negative  No results found for this or any previous visit (from the past  24 hour(s)).  Assessment: Martha Hall is a 24 y.o. G1P0000 with an IUP at 55w0dpresenting for IOL for GMontauk Will start with cytotec; monitor BS q 4 until transitions to active labor  Plan: #Labor: expectant management #Pain:  Per request #FWB Cat 1 #ID: GBS: neg #MOF:  breast #MOC: depo #Circ: yes   KStarr Lake7/03/2020, 9:49 AM

## 2019-11-16 NOTE — Progress Notes (Addendum)
Martha Hall is a 24 y.o. G1P0000 at [redacted]w[redacted]d admitted for GDMA2  Subjective: Pt laboring well; no complaints  Objective: BP 117/70   Pulse 91   Temp 98 F (36.7 C) (Oral)   Resp 16   Ht 5\' 1"  (1.549 m)   Wt 110.5 kg   LMP 02/16/2019   BMI 46.01 kg/m  No intake/output data recorded. No intake/output data recorded.  FHT:  FHR: 120 bpm, variability: moderate,  accelerations:  Present,  decelerations:  Absent UC:   regular, every 1-4 minutes SVE:   Dilation: 1 Effacement (%): 50 Station: -2 Exam by:: stone rnc  Labs: Lab Results  Component Value Date   WBC 6.8 11/16/2019   HGB 12.1 11/16/2019   HCT 37.9 11/16/2019   MCV 85.6 11/16/2019   PLT 317 11/16/2019    Assessment / Plan: Induction of labor due to gestational diabetes, miso X 2 Plan expectant mgmt  Labor:  early latent phase Preeclampsia:   NA Fetal Wellbeing:  Category I Pain Control:  Labor support without medications I/D:   GBS- Anticipated MOD:  NSVD  01/17/2020 11/16/2019, 4:24 PM   Attestation of Supervision of Student:  I confirm that I have verified the information documented in the nurse midwife student's note and that I have also personally reperformed the history, physical exam and all medical decision making activities.  I have verified that all services and findings are accurately documented in this student's note; and I agree with management and plan as outlined in the documentation. I have also made any necessary editorial changes.  Patient reports feeling very comfortable; no pain with contractions.  S/p two doses of cytotec; last dose at ~1600 Plan to insert FB at next check Last CBG was 70  01/17/2020, CNM [name], [facility] 11/16/2019 4:47 PM

## 2019-11-16 NOTE — Progress Notes (Signed)
cbg 63. 4 oz juice given. Will recheck cbg in 15 min

## 2019-11-17 ENCOUNTER — Inpatient Hospital Stay (HOSPITAL_COMMUNITY): Payer: Medicaid Other | Admitting: Anesthesiology

## 2019-11-17 DIAGNOSIS — Z3A39 39 weeks gestation of pregnancy: Secondary | ICD-10-CM | POA: Diagnosis not present

## 2019-11-17 DIAGNOSIS — O24429 Gestational diabetes mellitus in childbirth, unspecified control: Secondary | ICD-10-CM | POA: Diagnosis not present

## 2019-11-17 LAB — GLUCOSE, CAPILLARY
Glucose-Capillary: 93 mg/dL (ref 70–99)
Glucose-Capillary: 86 mg/dL (ref 70–99)
Glucose-Capillary: 103 mg/dL — ABNORMAL HIGH (ref 70–99)
Glucose-Capillary: 76 mg/dL (ref 70–99)
Glucose-Capillary: 71 mg/dL (ref 70–99)

## 2019-11-17 LAB — RPR: RPR Ser Ql: NONREACTIVE

## 2019-11-17 MED ORDER — LIDOCAINE-EPINEPHRINE (PF) 2 %-1:200000 IJ SOLN
INTRAMUSCULAR | Status: DC | PRN
  Administered 2019-11-17: 3 mL via EPIDURAL
  Administered 2019-11-17: 2 mL via EPIDURAL

## 2019-11-17 MED ORDER — SODIUM CHLORIDE (PF) 0.9 % IJ SOLN
INTRAMUSCULAR | Status: DC | PRN
  Administered 2019-11-17: 12 mL/h via EPIDURAL

## 2019-11-17 MED ORDER — OXYTOCIN-SODIUM CHLORIDE 30-0.9 UT/500ML-% IV SOLN
1.0000 m[IU]/min | INTRAVENOUS | Status: DC
  Administered 2019-11-17: 2 m[IU]/min via INTRAVENOUS

## 2019-11-17 NOTE — Progress Notes (Signed)
Martha Hall is a 24 y.o. G1P0000 at [redacted]w[redacted]d admitted for IOL 2/2 A2GDM (metformin)  Subjective: Sleeping on entry to room Comfortable w epidural  Objective: BP 129/74   Pulse 77   Temp 98.6 F (37 C) (Oral)   Resp 18   Ht 5\' 1"  (1.549 m)   Wt 110.5 kg   LMP 02/16/2019   BMI 46.01 kg/m  Total I/O In: -  Out: 700 [Urine:700]  FHT:  FHR: 120 bpm, variability: moderate,  accelerations:  Present,  decelerations:  Absent UC:   irregular, every 2-5 minutes  SVE:   Dilation: 5 Effacement (%): 60 Station: -2 Exam by:: MScott, RN   Labs: Lab Results  Component Value Date   WBC 6.8 11/16/2019   HGB 12.1 11/16/2019   HCT 37.9 11/16/2019   MCV 85.6 11/16/2019   PLT 317 11/16/2019    Assessment / Plan: 24 y.o. G1P0000 at [redacted]w[redacted]d admitted for IOL 2/2 A2GDM (metformin)  Labor: s/p FB and cyto x4, favorable for pit start 2x2, AROM once in good pattern. Fetal Wellbeing:  Category I Pain Control:  per patient request A2GDM: CBG q4h, q2h when active. Most recent 63>70>106>101>93 I/D:  GBS neg Anticipated MOD:  vaginal  [redacted]w[redacted]d MD/MP[facility] OB Fellow, Faculty Practice 11/17/2019, 8:24 AM

## 2019-11-17 NOTE — Anesthesia Procedure Notes (Signed)
Epidural Patient location during procedure: OB Start time: 11/17/2019 2:30 AM End time: 11/17/2019 2:45 AM  Staffing Anesthesiologist: Elmer Picker, MD Performed: anesthesiologist   Preanesthetic Checklist Completed: patient identified, IV checked, risks and benefits discussed, monitors and equipment checked, pre-op evaluation and timeout performed  Epidural Patient position: sitting Prep: DuraPrep and site prepped and draped Patient monitoring: continuous pulse ox, blood pressure, heart rate and cardiac monitor Approach: midline Location: L3-L4 Injection technique: LOR air  Needle:  Needle type: Tuohy  Needle gauge: 17 G Needle length: 9 cm Needle insertion depth: 7.5 cm Catheter type: closed end flexible Catheter size: 19 Gauge Catheter at skin depth: 13.5 cm Test dose: negative  Assessment Sensory level: T8 Events: blood not aspirated, injection not painful, no injection resistance, no paresthesia and negative IV test  Additional Notes Patient identified. Risks/Benefits/Options discussed with patient including but not limited to bleeding, infection, nerve damage, paralysis, failed block, incomplete pain control, headache, blood pressure changes, nausea, vomiting, reactions to medication both or allergic, itching and postpartum back pain. Confirmed with bedside nurse the patient's most recent platelet count. Confirmed with patient that they are not currently taking any anticoagulation, have any bleeding history or any family history of bleeding disorders. Patient expressed understanding and wished to proceed. All questions were answered. Sterile technique was used throughout the entire procedure. Please see nursing notes for vital signs. Test dose was given through epidural catheter and negative prior to continuing to dose epidural or start infusion. Warning signs of high block given to the patient including shortness of breath, tingling/numbness in hands, complete motor  block, or any concerning symptoms with instructions to call for help. Patient was given instructions on fall risk and not to get out of bed. All questions and concerns addressed with instructions to call with any issues or inadequate analgesia.  Reason for block:procedure for pain

## 2019-11-17 NOTE — Progress Notes (Signed)
[name], female   DOB: 11-Mar-1996, 24 y.o.   MRN: [id number]  Now is feeling like she has to have a BM and is requesting an exam; cx 8/90/vtx -1; FHR 120s, ctx q 2 mins. Has shoulder pain when uses PCA epidural, so would prefer to bear with the pressure. Plan to check cx in 2hrs or sooner with stronger urge to push.  Arabella Merles [facility] 11/17/2019 7:48 PM

## 2019-11-17 NOTE — Progress Notes (Signed)
[name], female   DOB: 1996-01-31, 24 y.o.   MRN: [id number]  In to introduce self to pt; sitting in bed with her legs bent and epidural in place; states that her vagina feels like it is 'tearing in two'  BP 124/79, 120/77 FHR 115-120, +accels, occ variables Ctx: MVUs 80-120 with Pit at 57mu/min Cx was 5-6/90/vtx -1 at 1735  CBGs 103, 86  IUP@39 .1wks GDMA2 Active labor  Wants to try an ice pack for the discomfort Plan to check cx between 2030-2100, or sooner with pressure/urge to push  Arabella Merles CNM 11/17/2019

## 2019-11-17 NOTE — Progress Notes (Addendum)
Martha Hall is a 24 y.o. G1P0000 at [redacted]w[redacted]d admitted for IOL 2/2 A2GDM (metformin)  Subjective: Resting comfortably  Objective: BP 116/64   Pulse 61   Temp 98.6 F (37 C) (Axillary)   Resp 16   Ht 5\' 1"  (1.549 m)   Wt 110.5 kg   LMP 02/16/2019   BMI 46.01 kg/m  Total I/O In: -  Out: 2900 [Urine:2900]  FHT:  FHR: 120 bpm, variability: moderate,  accelerations:  Present,  decelerations:  absent UC:   Regular, q3-4 min  SVE:   Dilation: 5.5 Effacement (%): 90 Station: -1 Exam by:: Dr. [email or web address]   Labs: Lab Results  Component Value Date   WBC 6.8 11/16/2019   HGB 12.1 11/16/2019   HCT 37.9 11/16/2019   MCV 85.6 11/16/2019   PLT 317 11/16/2019    Assessment / Plan: 24 y.o. G1P0000 at [redacted]w[redacted]d admitted for IOL 2/2 A2GDM (metformin)  Labor: s/p FB and cyto x4, on pitocin since 0830, AROM/IUPC at 1130. Minimal change since then though is somewhat more effaced and descended, unfortunately do feel a little bit of caput. Have needed to adjust pitocin from 10>6>8>6 due to periods of tachysystole and occasional late decelerations, though currently Cat I. Suspect some malpositioning of infant, have been doing frequent position changes and peanuting. Cont pitocin augmentation.  Fetal Wellbeing:  Cat I currently, prior period of Cat II for lates but now resolved Pain Control:  epidural A2GDM: CBG q4h, q2h when active. Most recent 63>70>106>101>93>86>103>76 I/D:  GBS neg Anticipated MOD:  vaginal  0831 MD/[facility] OB Fellow, Faculty [facility] 11/17/2019, 6:11 PM

## 2019-11-17 NOTE — Anesthesia Preprocedure Evaluation (Signed)
Anesthesia Evaluation  Patient identified by MRN, date of birth, ID band Patient awake    Reviewed: Allergy & Precautions, NPO status , Patient's Chart, lab work & pertinent test results  Airway Mallampati: III  TM Distance: >3 FB Neck ROM: Full    Dental no notable dental hx.    Pulmonary neg pulmonary ROS,    Pulmonary exam normal breath sounds clear to auscultation       Cardiovascular negative cardio ROS Normal cardiovascular exam Rhythm:Regular Rate:Normal     Neuro/Psych Hydrocephalus s/p VP shunt, asymptomatic currently negative neurological ROS  negative psych ROS   GI/Hepatic negative GI ROS, Neg liver ROS,   Endo/Other  diabetes, GestationalMorbid obesity (BMI 46)  Renal/GU negative Renal ROS  negative genitourinary   Musculoskeletal negative musculoskeletal ROS (+)   Abdominal   Peds  Hematology negative hematology ROS (+)   Anesthesia Other Findings   Reproductive/Obstetrics (+) Pregnancy                             Anesthesia Physical Anesthesia Plan  ASA: III  Anesthesia Plan: Epidural   Post-op Pain Management:    Induction:   PONV Risk Score and Plan: Treatment may vary due to age or medical condition  Airway Management Planned: Natural Airway  Additional Equipment:   Intra-op Plan:   Post-operative Plan:   Informed Consent: I have reviewed the patients History and Physical, chart, labs and discussed the procedure including the risks, benefits and alternatives for the proposed anesthesia with the patient or authorized representative who has indicated his/her understanding and acceptance.       Plan Discussed with: Anesthesiologist  Anesthesia Plan Comments: (Patient identified. Risks, benefits, options discussed with patient including but not limited to bleeding, infection, nerve damage, paralysis, failed block, incomplete pain control, headache, blood  pressure changes, nausea, vomiting, reactions to medication, itching, and post partum back pain. Confirmed with bedside nurse the patient's most recent platelet count. Confirmed with the patient that they are not taking any anticoagulation, have any bleeding history or any family history of bleeding disorders. Patient expressed understanding and wishes to proceed. All questions were answered. )        Anesthesia Quick Evaluation

## 2019-11-17 NOTE — Progress Notes (Signed)
Vernee Boettner is a 24 y.o. G1P0000 at [redacted]w[redacted]d admitted for IOL 2/2 A2GDM (metformin)  Subjective: Resting comfortably  Objective: BP (!) 125/59    Pulse 78    Temp 98.1 F (36.7 C) (Oral)    Resp 16    Ht 5\' 1"  (1.549 m)    Wt 110.5 kg    LMP 02/16/2019    BMI 46.01 kg/m  Total I/O In: -  Out: 1700 [Urine:1700]  FHT:  FHR: 125 bpm, variability: moderate,  accelerations:  Present,  decelerations:  Occasional late UC:   irregular, every 2-5 minutes  SVE:   Dilation: 5 Effacement (%): 60, 70 Station: -3 Exam by:: Dr. [email or web address]   Labs: Lab Results  Component Value Date   WBC 6.8 11/16/2019   HGB 12.1 11/16/2019   HCT 37.9 11/16/2019   MCV 85.6 11/16/2019   PLT 317 11/16/2019    Assessment / Plan: 24 y.o. G1P0000 at [redacted]w[redacted]d admitted for IOL 2/2 A2GDM (metformin)  Labor: s/p FB and cyto x4, on pitocin since 0830. More regular contraction pattern now, AROM for moderate amount of clear fluid and IUPC placed posteriorly (anterior placenta on most recent 0831). Cont pitocin augmentation.  Fetal Wellbeing:  Cat II for occasional late, will titrate pitocin accordingly but overall reassuring w moderate variability and accels Pain Control:  epidural A2GDM: CBG q4h, q2h when active. Most recent 63>70>106>101>93>86 I/D:  GBS neg Anticipated MOD:  vaginal  [place] MD/[facility] OB Fellow, Faculty [facility] 11/17/2019, 12:03 PM

## 2019-11-18 ENCOUNTER — Encounter (HOSPITAL_COMMUNITY): Payer: Self-pay | Admitting: Obstetrics and Gynecology

## 2019-11-18 DIAGNOSIS — O24425 Gestational diabetes mellitus in childbirth, controlled by oral hypoglycemic drugs: Secondary | ICD-10-CM | POA: Diagnosis not present

## 2019-11-18 DIAGNOSIS — O99214 Obesity complicating childbirth: Secondary | ICD-10-CM | POA: Diagnosis not present

## 2019-11-18 DIAGNOSIS — E669 Obesity, unspecified: Secondary | ICD-10-CM | POA: Diagnosis not present

## 2019-11-18 DIAGNOSIS — Z3A39 39 weeks gestation of pregnancy: Secondary | ICD-10-CM | POA: Diagnosis not present

## 2019-11-18 MED ORDER — COCONUT OIL OIL
1.0000 "application " | TOPICAL_OIL | Status: DC | PRN
  Administered 2019-11-18: 1 via TOPICAL

## 2019-11-18 MED ORDER — SIMETHICONE 80 MG PO CHEW: 80.0000 mg | CHEWABLE_TABLET | ORAL | Status: DC | PRN

## 2019-11-18 MED ORDER — ONDANSETRON HCL 4 MG/2ML IJ SOLN: 4.0000 mg | INTRAMUSCULAR | Status: DC | PRN

## 2019-11-18 MED ORDER — SENNOSIDES-DOCUSATE SODIUM 8.6-50 MG PO TABS
2.0000 | ORAL_TABLET | ORAL | Status: DC
  Administered 2019-11-18: 2 via ORAL
  Filled 2019-11-18 (×2): qty 2

## 2019-11-18 MED ORDER — DIPHENHYDRAMINE HCL 25 MG PO CAPS: 25.0000 mg | ORAL_CAPSULE | Freq: Four times a day (QID) | ORAL | Status: DC | PRN

## 2019-11-18 MED ORDER — ACETAMINOPHEN 325 MG PO TABS: 650.0000 mg | ORAL_TABLET | ORAL | Status: DC | PRN

## 2019-11-18 MED ORDER — ONDANSETRON HCL 4 MG PO TABS: 4.0000 mg | ORAL_TABLET | ORAL | Status: DC | PRN

## 2019-11-18 MED ORDER — OXYCODONE HCL 5 MG PO TABS: 5.0000 mg | ORAL_TABLET | ORAL | Status: DC | PRN

## 2019-11-18 MED ORDER — DIBUCAINE (PERIANAL) 1 % EX OINT: 1.0000 "application " | TOPICAL_OINTMENT | CUTANEOUS | Status: DC | PRN

## 2019-11-18 MED ORDER — WITCH HAZEL-GLYCERIN EX PADS: 1.0000 "application " | MEDICATED_PAD | CUTANEOUS | Status: DC | PRN

## 2019-11-18 MED ORDER — MEASLES, MUMPS & RUBELLA VAC IJ SOLR: 0.5000 mL | Freq: Once | INTRAMUSCULAR | Status: DC

## 2019-11-18 MED ORDER — IBUPROFEN 600 MG PO TABS
600.0000 mg | ORAL_TABLET | Freq: Four times a day (QID) | ORAL | Status: DC
  Administered 2019-11-18 – 2019-11-20 (×9): 600 mg via ORAL
  Filled 2019-11-18 (×9): qty 1

## 2019-11-18 MED ORDER — PRENATAL MULTIVITAMIN CH
1.0000 | ORAL_TABLET | Freq: Every day | ORAL | Status: DC
  Administered 2019-11-18 – 2019-11-19 (×2): 1 via ORAL
  Filled 2019-11-18 (×2): qty 1

## 2019-11-18 MED ORDER — BENZOCAINE-MENTHOL 20-0.5 % EX AERO
1.0000 "application " | INHALATION_SPRAY | CUTANEOUS | Status: DC | PRN
  Administered 2019-11-18: 1 via TOPICAL
  Filled 2019-11-18: qty 56

## 2019-11-18 MED ORDER — TETANUS-DIPHTH-ACELL PERTUSSIS 5-2.5-18.5 LF-MCG/0.5 IM SUSP: 0.5000 mL | Freq: Once | INTRAMUSCULAR | Status: DC

## 2019-11-18 MED ORDER — ZOLPIDEM TARTRATE 5 MG PO TABS: 5.0000 mg | ORAL_TABLET | Freq: Every evening | ORAL | Status: DC | PRN

## 2019-11-18 NOTE — Discharge Summary (Addendum)
Postpartum Discharge Summary     Patient Name: Martha Hall DOB: Sep 10, 1995 MRN: [id number]  Date of admission: 11/16/2019 Delivery date:11/18/2019  Delivering provider: Serita Grammes D  Date of discharge: 11/19/2019  Admitting diagnosis: Gestational diabetes mellitus (GDM) in third trimester controlled on oral hypoglycemic drug [O24.415] Intrauterine pregnancy: [redacted]w[redacted]d    Secondary diagnosis:  Active Problems:   Supervision of normal pregnancy, antepartum   Obesity in pregnancy   Lewis isoimmunization during pregnancy   Chlamydia infection affecting pregnancy   Gestational diabetes mellitus (GDM) in third trimester controlled on oral hypoglycemic drug   BMI 40.0-44.9, adult ([place]  Additional problems: none    Discharge diagnosis: Term Pregnancy Delivered and GDM A2                                              Post partum procedures: none Augmentation: AROM, Pitocin, Cytotec and IP Foley Complications: None  Hospital course: Induction of Labor With Vaginal Delivery   24y.o. yo G1P0000 at 324w2das admitted to the hospital 11/16/2019 for induction of labor.  Indication for induction: A2 DM (metformin).  Patient had a labor course remarkable for having the usual cervical ripening methods followed by Pit/AROM. She delivered approx 40hrs after her induction started, with pushing time a little less than 2hrs. Membrane Rupture Time/Date: 11:35 AM ,11/17/2019   Delivery Method:Vaginal, Spontaneous  Episiotomy: None  Lacerations:  None  Details of delivery can be found in separate delivery note.  Patient had a routine postpartum course. She will have a GTT at her post partum visit . Patient is discharged home 11/19/19.  Newborn Data: Birth date:11/18/2019  Birth time:1:43 AM  Gender:Female  Living status:Living  Apgars:8 ,9  Weight:3685 g  3685gm (8lb 2oz)  Magnesium Sulfate received: No BMZ received: No Rhophylac:N/A MMR:N/A T-DaP:Given prenatally Flu:  No Transfusion:No  Physical exam  Vitals:   11/18/19 0519 11/18/19 0900 11/18/19 1400 11/19/19 0500  BP: 132/78 131/84 128/80 119/84  Pulse: 88 (!) 116 100 94  Resp: '18 18 16   ' Temp: 99.1 F (37.3 C) 98.2 F (36.8 C) 98.2 F (36.8 C) 98.4 F (36.9 C)  TempSrc: Oral Oral Oral Oral  SpO2: 100%  100%   Weight:      Height:       General: alert, cooperative and no distress Lochia: appropriate Uterine Fundus: firm Incision: N/A DVT Evaluation: No evidence of DVT seen on physical exam. Labs: Lab Results  Component Value Date   WBC 6.8 11/16/2019   HGB 12.1 11/16/2019   HCT 37.9 11/16/2019   MCV 85.6 11/16/2019   PLT 317 11/16/2019   CMP Latest Ref Rng & Units 11/16/2019  Glucose 70 - 99 mg/dL 124(H)  BUN 6 - 20 mg/dL 8  Creatinine 0.44 - 1.00 mg/dL 0.66  Sodium 135 - 145 mmol/L 135  Potassium 3.5 - 5.1 mmol/L 4.0  Chloride 98 - 111 mmol/L 106  CO2 22 - 32 mmol/L 17(L)  Calcium 8.9 - 10.3 mg/dL 9.7  Total Protein 6.5 - 8.1 g/dL 6.4(L)  Total Bilirubin 0.3 - 1.2 mg/dL 0.4  Alkaline Phos 38 - 126 U/L 271(H)  AST 15 - 41 U/L 16  ALT 0 - 44 U/L 14   Edinburgh Score: Edinburgh Postnatal Depression Scale Screening Tool 11/18/2019  I have been able to laugh and see the funny side of things. 0  I have looked forward with enjoyment to things. 0  I have blamed myself unnecessarily when things went wrong. 2  I have been anxious or worried for no good reason. 2  I have felt scared or panicky for no good reason. 0  Things have been getting on top of me. 1  I have been so unhappy that I have had difficulty sleeping. 0  I have felt sad or miserable. 0  I have been so unhappy that I have been crying. 0  The thought of harming myself has occurred to me. 0  Edinburgh Postnatal Depression Scale Total 5     After visit meds:  Allergies as of 11/19/2019       Reactions   Metoclopramide Itching        Medication List     STOP taking these medications    aspirin EC 81 MG  tablet   cyclobenzaprine 10 MG tablet Commonly known as: FLEXERIL   Doxylamine-Pyridoxine 10-10 MG Tbec Commonly known as: Diclegis   metFORMIN 500 MG tablet Commonly known as: GLUCOPHAGE   metoCLOPramide 5 MG tablet Commonly known as: Reglan       TAKE these medications    Accu-Chek Guide w/Device Kit 1 each by Does not apply route in the morning, at noon, in the evening, and at bedtime.   Accu-Chek Softclix Lancets lancets Use as instructed   acetaminophen 500 MG tablet Commonly known as: TYLENOL Take 1,000 mg by mouth every 6 (six) hours as needed for headache.   Blood Pressure Kit Devi 1 kit by Does not apply route once a week. Check BP regularly and record readings into the Babyscripts App.  Large Cuff.  DX O90.0   diphenhydrAMINE 25 MG tablet Commonly known as: BENADRYL Take 12.5 mg by mouth every 6 (six) hours as needed for allergies.   famotidine 20 MG tablet Commonly known as: PEPCID TAKE 1 TABLET BY MOUTH TWICE A DAY   glucose blood test strip Use 1 test strip to check glucose 4 times daily.   ibuprofen 600 MG tablet Commonly known as: ADVIL Take 1 tablet (600 mg total) by mouth every 6 (six) hours.   pantoprazole 40 MG tablet Commonly known as: Protonix Take 1 tablet (40 mg total) by mouth daily.   Prenatal Vitamin 27-0.8 MG Tabs Take 1 tablet by mouth daily.         Discharge home in stable condition Infant Feeding: Bottle Infant Disposition:home with mother Discharge instruction: per After Visit Summary and Postpartum booklet. Activity: Advance as tolerated. Pelvic rest for 6 weeks.  Diet: routine diet Future Appointments: Future Appointments  Date Time Provider [place]  11/20/2019  8:15 AM Suzzanne Cloud, NP [facility] None  12/30/2019  8:45 AM [facility] None  12/30/2019  9:00 AM Cephas Darby, MD [place] None   Follow up Visit:  Myrtis Ser, CNM  P [facility] Please schedule this patient for  Postpartum visit in: 4 weeks with the following provider: Any provider  In-Person  For C/S patients schedule nurse incision check in weeks 2 weeks: no  High risk pregnancy complicated by: GDM  Delivery mode:  SVD  Anticipated Birth Control:  Depo  PP Procedures needed: 2 hour GTT  Schedule Integrated BH visit: no    11/19/2019 Rise Patience, DO  PGY1 Family Medicine  GME ATTESTATION:  I saw and evaluated the patient. I agree with the findings and the plan of care as documented in the resident's  note.  Merilyn Baba, DO OB Fellow, [place] for [place] 11/19/2019 6:51 AM

## 2019-11-18 NOTE — Discharge Instructions (Signed)

## 2019-11-18 NOTE — Lactation Note (Signed)
This note was copied from a baby's chart. Lactation Consultation Note  Patient Name: Martha Hall Today's Date: 11/18/2019   Initial visit at 9 hours of life. Mom is a P1 who did not experience an increase in breast size during pregnancy, but did have sore breasts & noted an increase in veining.   When I entered room, infant was sucking on hand. Mom consented to latch assist. With Mom in side-lying position & using the teacup hold, infant latched with ease. Mom was comfortable with latch. I taught Mom how to do breast compressions to increase frequency of swallowing.   Mom was made aware of O/P services, breastfeeding support groups, and our phone # for post-discharge questions.   Martha Hall [facility] 11/18/2019, 11:06 AM

## 2019-11-18 NOTE — Progress Notes (Signed)
[name], female   DOB: 10-04-1995, 24 y.o.   MRN: [id number]  Just starting to push  BP 142/74, 128/69 FHR 115-120s, +accels, occ variables Ctx q 3-4 mins with Pit at 2mu/min Cx initially w/ thin ant lip- reduced to complete after pushing w/ 2 ctx; vtx +1/+2  CBG 71  IUP@39 .2wks GDMA2 End 1st stage  Keep pushing w/ ctx Hopeful for vag del  Arabella Merles [facility] 11/18/2019 12:07 AM

## 2019-11-19 MED ORDER — IBUPROFEN 600 MG PO TABS: 600.0000 mg | ORAL_TABLET | Freq: Four times a day (QID) | ORAL | 0 refills | Status: DC

## 2019-11-19 NOTE — Progress Notes (Addendum)
POSTPARTUM PROGRESS NOTE  Subjective: Martha Hall is a 24 y.o. G1P1001 s/p VD at [redacted]w[redacted]d for IOL due to [facility].  She reports she doing well. No acute events overnight. She denies any problems with ambulating, voiding or po intake. Denies nausea or vomiting. She has  passed flatus. Pain is moderately controlled.  Lochia is appropriate.  Objective: Blood pressure 119/84, pulse 94, temperature 98.4 F (36.9 C), temperature source Oral, resp. rate 16, height 5\' 1"  (1.549 m), weight 110.5 kg, last menstrual period 02/16/2019, SpO2 100 %, unknown if currently breastfeeding.  Physical Exam:  General: alert, cooperative and no distress Chest: no respiratory distress Abdomen: soft, non-tender  Uterine Fundus: firm, appropriately tender Extremities: No calf swelling or tenderness  no edema  Recent Labs    11/16/19 0950  HGB 12.1  HCT 37.9    Assessment/Plan: Martha Hall is a 24 y.o. G1P1001 s/p VD at [redacted]w[redacted]d for GDMA2.  Routine Postpartum Care: Doing well, pain well-controlled.  -- Continue routine care, lactation support  -- Contraception: Depo -- Feeding: Breast and bottle  #Circ: Yes #GDMA1: last 5 readings as follows 93>86>103>76>71  Dispo: Plan for discharge tomorrow.  [redacted]w[redacted]d, DO PGY1 Family Medicine Resident  GME ATTESTATION:  I saw and evaluated the patient. I agree with the findings and the plan of care as documented in the resident's note, with the exception that the patient wishes to be discharged today. See DC summary for details.  I also discussed risks/benefits of circumcision with the patient- she wishes for her son to be circumcised when cleared by peds.  Evelena Leyden, DO OB Fellow, Faculty [facility], Center for [facility] 11/19/2019 6:49 AM

## 2019-11-19 NOTE — Anesthesia Postprocedure Evaluation (Signed)
Anesthesia Post Note  Patient: Martha Hall  Procedure(s) Performed: AN AD HOC LABOR EPIDURAL     Patient location during evaluation: Mother Baby Anesthesia Type: Epidural Level of consciousness: awake and alert Pain management: pain level controlled Vital Signs Assessment: post-procedure vital signs reviewed and stable Respiratory status: spontaneous breathing, nonlabored ventilation and respiratory function stable Cardiovascular status: stable Postop Assessment: no headache, no backache and epidural receding Anesthetic complications: no   No complications documented.  Last Vitals:  Vitals:   11/18/19 1400 11/19/19 0500  BP: 128/80 119/84  Pulse: 100 94  Resp: 16   Temp: 36.8 C 36.9 C  SpO2: 100%     Last Pain:  Vitals:   11/19/19 0533  TempSrc:   PainSc: 0-No pain   Pain Goal: Patients Stated Pain Goal: 2 (11/18/19 0900)                 Lidia Clavijo L [facility] [place]

## 2019-11-19 NOTE — Progress Notes (Deleted)
PATIENT: Martha Hall DOB: [birth year]  REASON FOR VISIT: follow up HISTORY FROM: patient  HISTORY OF PRESENT ILLNESS: Today 11/19/19  HISTORY Martha Hall is a 24 year old female, seen in request by her obstetrician Dr. Emeterio Reeve for evaluation of hydrocephalus, initial evaluation was on August 21, 2019.  I have reviewed and summarized the referring note from the referring physician.  She is currently [redacted] weeks pregnant, this is her first pregnancy, due date will be November 20, 2019.  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  She did have a history of headaches since teenage years, usually holoacranial annoying pressure headache, there was no significant light noise sensitivity, no nausea, lasting for couple days, she used to have it frequently couple times each week, it continued during her early pregnancy, now she rarely has headaches anymore.  She wants to know more information about her hydrocephalus with expected epidural anesthesia during delivery.  Laboratory evaluation in December 2020, A1c 5.6, normal CBC hemoglobin of 12.9,  Update November 20, 2019 SS: MRI of the brain was ordered, was not yet completed. Discharged yesterday from the hospital, following baby delivery.  REVIEW OF SYSTEMS: Out of a complete 14 system review of symptoms, the patient complains only of the following symptoms, and all other reviewed systems are negative.  ALLERGIES: Allergies  Allergen Reactions  . Metoclopramide Itching    HOME MEDICATIONS: Facility-Administered Medications Prior to Visit  Medication Dose Route Frequency Provider Last Rate Last Admin  . acetaminophen  (TYLENOL) tablet 650 mg  650 mg Oral Q4H PRN Myrtis Ser, CNM      . benzocaine-Menthol (DERMOPLAST) 20-0.5 % topical spray 1 application  1 application Topical PRN Myrtis Ser, CNM   1 application at 29/47/65 4650  . coconut oil  1 application Topical PRN Myrtis Ser, CNM   1 application at 35/46/56 0511  . witch hazel-glycerin (TUCKS) pad 1 application  1 application Topical PRN Myrtis Ser, CNM       And  . dibucaine (NUPERCAINAL) 1 % rectal ointment 1 application  1 application Rectal PRN Myrtis Ser, CNM      . diphenhydrAMINE (BENADRYL) capsule 25 mg  25 mg Oral Q6H PRN Myrtis Ser, CNM      . ibuprofen (ADVIL) tablet 600 mg  600 mg Oral Q6H Serita Grammes D, CNM   600 mg at 11/19/19 1117  . measles, mumps & rubella vaccine (MMR) injection 0.5 mL  0.5 mL Subcutaneous Once Myrtis Ser, CNM      . ondansetron (ZOFRAN) tablet 4 mg  4 mg Oral Q4H PRN Myrtis Ser, CNM       Or  . ondansetron [facility]) injection 4 mg  4 mg Intravenous Q4H PRN Myrtis Ser, CNM      . oxyCODONE (Oxy IR/ROXICODONE) immediate release tablet 5 mg  5 mg Oral Q4H PRN Myrtis Ser, CNM      . prenatal multivitamin tablet 1 tablet  1 tablet Oral Q1200 Myrtis Ser, CNM   1 tablet at 11/19/19 1117  . senna-docusate (Senokot-S) tablet 2 tablet  2 tablet Oral Q24H Myrtis Ser, CNM   2 tablet  at 11/18/19 2355  . simethicone (MYLICON) chewable tablet 80 mg  80 mg Oral PRN Myrtis Ser, CNM      . Tdap (BOOSTRIX) injection 0.5 mL  0.5 mL Intramuscular Once Myrtis Ser, CNM      . zolpidem (AMBIEN) tablet 5 mg  5 mg Oral QHS PRN Myrtis Ser, CNM       Outpatient Medications Prior to Visit  Medication Sig Dispense Refill  . Accu-Chek Softclix Lancets lancets Use as instructed 100 each 12  . acetaminophen (TYLENOL) 500 MG tablet Take 1,000 mg by mouth every 6 (six) hours as needed for headache.    Marland Kitchen aspirin EC 81 MG tablet Take 1 tablet (81 mg total) by  mouth daily. Take after 12 weeks for prevention of preeclampsia later in pregnancy (Patient taking differently: Take 81 mg by mouth daily. ) 30 tablet 3  . Blood Glucose Monitoring Suppl (ACCU-CHEK GUIDE) w/Device KIT 1 each by Does not apply route in the morning, at noon, in the evening, and at bedtime. 1 kit 0  . Blood Pressure Monitoring (BLOOD PRESSURE KIT) DEVI 1 kit by Does not apply route once a week. Check BP regularly and record readings into the Babyscripts App.  Large Cuff.  DX O90.0 1 each 0  . cyclobenzaprine (FLEXERIL) 10 MG tablet Take 1 tablet (10 mg total) by mouth 3 (three) times daily as needed for muscle spasms. 30 tablet 2  . diphenhydrAMINE (BENADRYL) 25 MG tablet Take 12.5 mg by mouth every 6 (six) hours as needed for allergies.    . Doxylamine-Pyridoxine (DICLEGIS) 10-10 MG TBEC Take 2 tablets at bedtime, may add 1 tablet at breakfast & 1 tablet at lunch if needed. (Patient not taking: Reported on 10/02/2019) 60 tablet 0  . famotidine (PEPCID) 20 MG tablet TAKE 1 TABLET BY MOUTH TWICE A DAY 60 tablet 5  . glucose blood test strip Use 1 test strip to check glucose 4 times daily. 100 each 12  . ibuprofen (ADVIL) 600 MG tablet Take 1 tablet (600 mg total) by mouth every 6 (six) hours. 30 tablet 0  . metFORMIN (GLUCOPHAGE) 500 MG tablet Take 1 tablet (500 mg total) by mouth at bedtime. 30 tablet 5  . metoCLOPramide (REGLAN) 5 MG tablet Take 1 tablet (5 mg total) by mouth 3 (three) times daily before meals. (Patient not taking: Reported on 11/16/2019) 60 tablet 2  . pantoprazole (PROTONIX) 40 MG tablet Take 1 tablet (40 mg total) by mouth daily. 30 tablet 1  . Prenatal Vit-Fe Fumarate-FA (PRENATAL VITAMIN) 27-0.8 MG TABS Take 1 tablet by mouth daily. 30 tablet 11    PAST MEDICAL HISTORY: Past Medical History:  Diagnosis Date  . GDM (gestational diabetes mellitus) 09/2019  . Gestational diabetes   . Hydrocephalus (HCC)    hx of  . Infection    UTI    PAST SURGICAL  HISTORY: Past Surgical History:  Procedure Laterality Date  . BRAIN SURGERY  December 10, 1997   stint placed when born    FAMILY HISTORY: Family History  Problem Relation Age of Onset  . Healthy Mother   . Healthy Father     SOCIAL HISTORY: Social History   Socioeconomic History  . Marital status: Single    Spouse name: Not on file  . Number of children: Not on file  . Years of education: Not on file  . Highest education level: Associate degree: occupational, Hotel manager, or vocational program  Occupational History  . Occupation: Therapist, art  Comment: [employer]  Tobacco Use  . Smoking status: Never Smoker  . Smokeless tobacco: Never Used  Vaping Use  . Vaping Use: Never used  Substance and Sexual Activity  . Alcohol use: Never  . Drug use: Never  . Sexual activity: Not Currently    Comment: undecided  Other Topics Concern  . Not on file  Social History Narrative  . Not on file   Social Determinants of Health   Financial Resource Strain:   . Difficulty of Paying Living Expenses:   Food Insecurity:   . Worried About Charity fundraiser in the Last Year:   . Arboriculturist in the Last Year:   Transportation Needs:   . Film/video editor (Medical):   Marland Kitchen Lack of Transportation (Non-Medical):   Physical Activity:   . Days of Exercise per Week:   . Minutes of Exercise per Session:   Stress:   . Feeling of Stress :   Social Connections:   . Frequency of Communication with Friends and Family:   . Frequency of Social Gatherings with Friends and Family:   . Attends Religious Services:   . Active Member of Clubs or Organizations:   . Attends Archivist Meetings:   Marland Kitchen Marital Status:   Intimate Partner Violence:   . Fear of Current or Ex-Partner:   . Emotionally Abused:   Marland Kitchen Physically Abused:   . Sexually Abused:       PHYSICAL EXAM  There were no vitals filed for this visit. There is no height or weight on file to calculate BMI.  Generalized:  Well developed, in no acute distress   Neurological examination  Mentation: Alert oriented to time, place, history taking. Follows all commands speech and language fluent Cranial nerve II-XII: Pupils were equal round reactive to light. Extraocular movements were full, visual field were full on confrontational test. Facial sensation and strength were normal. Uvula tongue midline. Head turning and shoulder shrug  were normal and symmetric. Motor: The motor testing reveals 5 over 5 strength of all 4 extremities. Good symmetric motor tone is noted throughout.  Sensory: Sensory testing is intact to soft touch on all 4 extremities. No evidence of extinction is noted.  Coordination: Cerebellar testing reveals good finger-nose-finger and heel-to-shin bilaterally.  Gait and station: Gait is normal. Tandem gait is normal. Romberg is negative. No drift is seen.  Reflexes: Deep tendon reflexes are symmetric and normal bilaterally.   DIAGNOSTIC DATA (LABS, IMAGING, TESTING) - I reviewed patient records, labs, notes, testing and imaging myself where available.  Lab Results  Component Value Date   WBC 6.8 11/16/2019   HGB 12.1 11/16/2019   HCT 37.9 11/16/2019   MCV 85.6 11/16/2019   PLT 317 11/16/2019      Component Value Date/Time   NA 135 11/16/2019 0950   K 4.0 11/16/2019 0950   CL 106 11/16/2019 0950   CO2 17 (L) 11/16/2019 0950   GLUCOSE 124 (H) 11/16/2019 0950   BUN 8 11/16/2019 0950   CREATININE 0.66 11/16/2019 0950   CALCIUM 9.7 11/16/2019 0950   PROT 6.4 (L) 11/16/2019 0950   ALBUMIN 2.7 (L) 11/16/2019 0950   AST 16 11/16/2019 0950   ALT 14 11/16/2019 0950   ALKPHOS 271 (H) 11/16/2019 0950   BILITOT 0.4 11/16/2019 0950   GFRNONAA >60 11/16/2019 0950   GFRAA >60 11/16/2019 0950   No results found for: CHOL, HDL, LDLCALC, LDLDIRECT, TRIG, CHOLHDL Lab Results  Component Value Date  HGBA1C 5.6 04/30/2019   No results found for: VITAMINB12 No results found for:  TSH    ASSESSMENT AND PLAN 24 y.o. year old female  has a past medical history of GDM (gestational diabetes mellitus) (09/2019), Gestational diabetes, Hydrocephalus ([place]), and Infection. here with ***   I spent 15 minutes with the patient. 50% of this time was spent   Butler Denmark, [place], DNP 11/19/2019, 3:57 PM [facility] [address] [place],  [address] [phone]

## 2019-11-20 ENCOUNTER — Ambulatory Visit: Payer: 59 | Admitting: Neurology

## 2019-11-20 MED ORDER — MEDROXYPROGESTERONE ACETATE 150 MG/ML IM SUSP
150.0000 mg | Freq: Once | INTRAMUSCULAR | Status: AC
  Administered 2019-11-20: 150 mg via INTRAMUSCULAR
  Filled 2019-11-20: qty 1

## 2019-11-20 MED ORDER — ACETAMINOPHEN 325 MG PO TABS: 325.0000 mg | ORAL_TABLET | ORAL | 0 refills | Status: DC | PRN

## 2019-11-20 MED ORDER — POLYETHYLENE GLYCOL 3350 17 GM/SCOOP PO POWD
17.0000 g | Freq: Every day | ORAL | 1 refills | Status: DC | PRN
Start: 2019-11-20 — End: 2020-12-19

## 2019-11-20 NOTE — Lactation Note (Signed)
This note was copied from a baby's chart. Lactation Consultation Note  Patient Name: Martha Hall Today's Date: 11/20/2019   I spoke with Rose Phi, RN. Mom has chosen to formula feed only.   Lurline Hare [facility] 11/20/2019, 8:45 AM

## 2019-11-20 NOTE — Discharge Summary (Signed)
Postpartum Discharge Summary     Patient Name: Martha Hall DOB: 08-13-1995 MRN: [id number]  Date of admission: 11/16/2019 Delivery date:11/18/2019  Delivering provider: Serita Grammes D  Date of discharge: 11/20/2019  Admitting diagnosis: Gestational diabetes mellitus (GDM) in third trimester controlled on oral hypoglycemic drug [O24.415] Intrauterine pregnancy: [redacted]w[redacted]d    Secondary diagnosis:  Active Problems:   Supervision of normal pregnancy, antepartum   Obesity in pregnancy   Lewis isoimmunization during pregnancy   Chlamydia infection affecting pregnancy   Gestational diabetes mellitus (GDM) in third trimester controlled on oral hypoglycemic drug   BMI 40.0-44.9, adult ([place]  Additional problems: none    Discharge diagnosis: Term Pregnancy Delivered and GDM A2                                              Post partum procedures:Depo ordered to be given prior to discharge Augmentation: AROM, Pitocin, Cytotec and IP Foley Complications: None  Hospital course: Induction of Labor With Vaginal Delivery   24y.o. yo G1P0000 at 339w2das admitted to the hospital 11/16/2019 for induction of labor.  Indication for induction: A2 DM (metformin).  Patient had a labor course remarkable for having the usual cervical ripening methods followed by Pit/AROM. She delivered approx 40hrs after her induction started, with pushing time a little less than 2hrs. Membrane Rupture Time/Date: 11:35 AM ,11/17/2019   Delivery Method:Vaginal, Spontaneous  Episiotomy: None  Lacerations:  None  Details of delivery can be found in separate delivery note.  Patient had a routine postpartum course. She will have a GTT at her post partum visit . Patient is discharged home 11/20/19.  Newborn Data: Birth date:11/18/2019  Birth time:1:43 AM  Gender:Female  Living status:Living  Apgars:8 ,9  Weight:3685 g  3685gm (8lb 2oz)  Magnesium Sulfate received: No BMZ received: No Rhophylac:N/A MMR:N/A T-DaP:Given  prenatally Flu: No Transfusion:No  Physical exam  Vitals:   11/19/19 1517 11/19/19 2045 11/20/19 0230 11/20/19 0520  BP: 120/75 117/78 133/79 126/80  Pulse: 95 82 91 98  Resp: '16 16 16 16  ' Temp: 98.3 F (36.8 C) 98.6 F (37 C)  98.9 F (37.2 C)  TempSrc: Oral Oral  Oral  SpO2: 100% 100%  100%  Weight:      Height:       General: alert, cooperative and no distress Lochia: appropriate Uterine Fundus: firm Incision: N/A DVT Evaluation: No evidence of DVT seen on physical exam. Labs: Lab Results  Component Value Date   WBC 6.8 11/16/2019   HGB 12.1 11/16/2019   HCT 37.9 11/16/2019   MCV 85.6 11/16/2019   PLT 317 11/16/2019   CMP Latest Ref Rng & Units 11/16/2019  Glucose 70 - 99 mg/dL 124(H)  BUN 6 - 20 mg/dL 8  Creatinine 0.44 - 1.00 mg/dL 0.66  Sodium 135 - 145 mmol/L 135  Potassium 3.5 - 5.1 mmol/L 4.0  Chloride 98 - 111 mmol/L 106  CO2 22 - 32 mmol/L 17(L)  Calcium 8.9 - 10.3 mg/dL 9.7  Total Protein 6.5 - 8.1 g/dL 6.4(L)  Total Bilirubin 0.3 - 1.2 mg/dL 0.4  Alkaline Phos 38 - 126 U/L 271(H)  AST 15 - 41 U/L 16  ALT 0 - 44 U/L 14   Edinburgh Score: Edinburgh Postnatal Depression Scale Screening Tool 11/18/2019  I have been able to laugh and see the funny side of things.  0  I have looked forward with enjoyment to things. 0  I have blamed myself unnecessarily when things went wrong. 2  I have been anxious or worried for no good reason. 2  I have felt scared or panicky for no good reason. 0  Things have been getting on top of me. 1  I have been so unhappy that I have had difficulty sleeping. 0  I have felt sad or miserable. 0  I have been so unhappy that I have been crying. 0  The thought of harming myself has occurred to me. 0  Edinburgh Postnatal Depression Scale Total 5     After visit meds:  Allergies as of 11/20/2019      Reactions   Metoclopramide Itching      Medication List    STOP taking these medications   Accu-Chek Guide w/Device Kit     Accu-Chek Softclix Lancets lancets   aspirin EC 81 MG tablet   cyclobenzaprine 10 MG tablet Commonly known as: FLEXERIL   Doxylamine-Pyridoxine 10-10 MG Tbec Commonly known as: Diclegis   glucose blood test strip   metFORMIN 500 MG tablet Commonly known as: GLUCOPHAGE   metoCLOPramide 5 MG tablet Commonly known as: Reglan     TAKE these medications   acetaminophen 325 MG tablet Commonly known as: TYLENOL Take 1 tablet (325 mg total) by mouth every 4 (four) hours as needed for moderate pain. What changed:   medication strength  how much to take  when to take this  reasons to take this   Blood Pressure Kit Devi 1 kit by Does not apply route once a week. Check BP regularly and record readings into the [employer] App.  Large Cuff.  DX O90.0   diphenhydrAMINE 25 MG tablet Commonly known as: BENADRYL Take 12.5 mg by mouth every 6 (six) hours as needed for allergies.   famotidine 20 MG tablet Commonly known as: PEPCID TAKE 1 TABLET BY MOUTH TWICE A DAY   ibuprofen 600 MG tablet Commonly known as: ADVIL Take 1 tablet (600 mg total) by mouth every 6 (six) hours.   pantoprazole 40 MG tablet Commonly known as: Protonix Take 1 tablet (40 mg total) by mouth daily.   polyethylene glycol powder 17 GM/SCOOP powder Commonly known as: GLYCOLAX/MIRALAX Take 17 g by mouth daily as needed.   Prenatal Vitamin 27-0.8 MG Tabs Take 1 tablet by mouth daily.        Discharge home in stable condition Infant Feeding: Bottle Infant Disposition:home with mother Discharge instruction: per After Visit Summary and Postpartum booklet. Activity: Advance as tolerated. Pelvic rest for 6 weeks.  Diet: routine diet Future Appointments: Future Appointments  Date Time Provider [place]  11/20/2019  8:15 AM Suzzanne Cloud, NP [facility] None  12/30/2019  8:45 AM [facility] None  12/30/2019  9:00 AM Cephas Darby, MD [place] None   Follow up Visit: Myrtis Ser, CNM  P [facility] Please schedule this patient for Postpartum visit in: 4 weeks with the following provider: Any provider  In-Person  For C/S patients schedule nurse incision check in weeks 2 weeks: no  High risk pregnancy complicated by: GDM  Delivery mode:  SVD  Anticipated Birth Control:  Depo  PP Procedures needed: 2 hour GTT  Schedule Integrated BH visit: no     Clarnce Flock, MD  OB Fellow, [place] 11/20/2019 7:49 AM

## 2019-11-21 ENCOUNTER — Other Ambulatory Visit: Payer: Self-pay | Admitting: Obstetrics & Gynecology

## 2019-11-21 DIAGNOSIS — O24415 Gestational diabetes mellitus in pregnancy, controlled by oral hypoglycemic drugs: Secondary | ICD-10-CM

## 2019-12-30 ENCOUNTER — Ambulatory Visit (INDEPENDENT_AMBULATORY_CARE_PROVIDER_SITE_OTHER): Payer: Medicaid Other | Admitting: Obstetrics and Gynecology

## 2019-12-30 ENCOUNTER — Other Ambulatory Visit: Payer: Self-pay

## 2019-12-30 ENCOUNTER — Encounter: Payer: Self-pay | Admitting: Obstetrics and Gynecology

## 2019-12-30 ENCOUNTER — Other Ambulatory Visit: Payer: Medicaid Other

## 2019-12-30 VITALS — BP 115/75 | HR 67 | Ht 61.0 in | Wt 220.0 lb

## 2019-12-30 DIAGNOSIS — O0990 Supervision of high risk pregnancy, unspecified, unspecified trimester: Secondary | ICD-10-CM

## 2019-12-30 MED ORDER — MEDROXYPROGESTERONE ACETATE 150 MG/ML IM SUSP: 150.0000 mg | INTRAMUSCULAR | 0 refills | Status: DC

## 2019-12-30 NOTE — Progress Notes (Signed)
..     Post Partum Visit Note  Martha Hall is a 24 y.o. G68P1001 female who presents for a postpartum visit. She is 6 weeks postpartum following a normal spontaneous vaginal delivery.  I have fully reviewed the prenatal and intrapartum course. The delivery was at 39.0 gestational weeks.  Anesthesia: epidural. Postpartum course has been good. Baby is doing well. Baby is feeding by bottle [place] Barer good start. Bleeding no bleeding. Bowel function is normal. Bladder function is normal. Patient is not sexually active. Contraception method is Depo-Provera injections. Postpartum depression screening: positive.   The pregnancy intention screening data noted above was reviewed. Potential methods of contraception were discussed. The patient elected to proceed with Abstinence.      The following portions of the patient's history were reviewed and updated as appropriate: allergies, current medications, past family history, past medical history, past social history, past surgical history and problem list.  Review of Systems A comprehensive review of systems was negative.    Objective:  unknown if currently breastfeeding.  General:  alert and cooperative   Breasts:  inspection negative, no nipple discharge or bleeding, no masses or nodularity palpable  Lungs: clear to auscultation bilaterally  Heart:  regular rate and rhythm, S1, S2 normal, no murmur, click, rub or gallop  Abdomen: soft, non-tender; bowel sounds normal; no masses,  no organomegaly   Vulva:  not evaluated  Vagina: not evaluated  Cervix:  not evaluated  Corpus: not examined  Adnexa:  not evaluated  Rectal Exam: Not performed.        Assessment:    6 week postpartum exam.   Plan:   Essential components of care per ACOG recommendations:  1.  Mood and well being: Patient with negative depression screening today. Reviewed local resources for support.  - Patient does not use tobacco. 2. Infant care and feeding:  -Patient  currently breastmilk feeding? No -Social determinants of health (SDOH) reviewed in EPIC. No concerns noted.  3. Sexuality, contraception and birth spacing - Patient does not want a pregnancy in the next year.  Desired family size is unknown number of children.  - Reviewed forms of contraception in tiered fashion. Patient desired Depo-Provera.  First dose given in hospital after delivery.  - Discussed birth spacing of 18 months  4. Sleep and fatigue -Encouraged family/partner/community support of 4 hrs of uninterrupted sleep to help with mood and fatigue  5. Physical Recovery  - Discussed patients delivery, gestational diabetes, controlled with metformin and the risk of Type 2 DM. - Patient had a no laceration.  - Patient has urinary incontinence? No   - Patient is safe to resume physical and sexual activity  6.  Health Maintenance - Last pap smear done 04/30/2019 and was normal with negative HPV.  Darrin Nipper. Gerri Spore, MD [facility] [employer], [facility]

## 2019-12-30 NOTE — Patient Instructions (Signed)
Medroxyprogesterone injection [Contraceptive] What is this medicine? MEDROXYPROGESTERONE (me DROX ee proe JES te rone) contraceptive injections prevent pregnancy. They provide effective birth control for 3 months. Depo-subQ Provera 104 is also used for treating pain related to endometriosis. This medicine may be used for other purposes; ask your health care provider or pharmacist if you have questions. COMMON BRAND NAME(S): Depo-Provera, Depo-subQ Provera 104 What should I tell my health care provider before I take this medicine? They need to know if you have any of these conditions:  frequently drink alcohol  asthma  blood vessel disease or a history of a blood clot in the lungs or legs  bone disease such as osteoporosis  breast cancer  diabetes  eating disorder (anorexia nervosa or bulimia)  high blood pressure  HIV infection or AIDS  kidney disease  liver disease  mental depression  migraine  seizures (convulsions)  stroke  tobacco smoker  vaginal bleeding  an unusual or allergic reaction to medroxyprogesterone, other hormones, medicines, foods, dyes, or preservatives  pregnant or trying to get pregnant  breast-feeding How should I use this medicine? Depo-Provera Contraceptive injection is given into a muscle. Depo-subQ Provera 104 injection is given under the skin. These injections are given by a health care professional. You must not be pregnant before getting an injection. The injection is usually given during the first 5 days after the start of a menstrual period or 6 weeks after delivery of a baby. Talk to your pediatrician regarding the use of this medicine in children. Special care may be needed. These injections have been used in female children who have started having menstrual periods. Overdosage: If you think you have taken too much of this medicine contact a poison control center or emergency room at once. NOTE: This medicine is only for you. Do not  share this medicine with others. What if I miss a dose? Try not to miss a dose. You must get an injection once every 3 months to maintain birth control. If you cannot keep an appointment, call and reschedule it. If you wait longer than 13 weeks between Depo-Provera contraceptive injections or longer than 14 weeks between Depo-subQ Provera 104 injections, you could get pregnant. Use another method for birth control if you miss your appointment. You may also need a pregnancy test before receiving another injection. What may interact with this medicine? Do not take this medicine with any of the following medications:  bosentan This medicine may also interact with the following medications:  aminoglutethimide  antibiotics or medicines for infections, especially rifampin, rifabutin, rifapentine, and griseofulvin  aprepitant  barbiturate medicines such as phenobarbital or primidone  bexarotene  carbamazepine  medicines for seizures like ethotoin, felbamate, oxcarbazepine, phenytoin, topiramate  modafinil  St. John's wort This list may not describe all possible interactions. Give your health care provider a list of all the medicines, herbs, non-prescription drugs, or dietary supplements you use. Also tell them if you smoke, drink alcohol, or use illegal drugs. Some items may interact with your medicine. What should I watch for while using this medicine? This drug does not protect you against HIV infection (AIDS) or other sexually transmitted diseases. Use of this product may cause you to lose calcium from your bones. Loss of calcium may cause weak bones (osteoporosis). Only use this product for more than 2 years if other forms of birth control are not right for you. The longer you use this product for birth control the more likely you will be at risk   for weak bones. Ask your health care professional how you can keep strong bones. You may have a change in bleeding pattern or irregular periods.  Many females stop having periods while taking this drug. If you have received your injections on time, your chance of being pregnant is very low. If you think you may be pregnant, see your health care professional as soon as possible. Tell your health care professional if you want to get pregnant within the next year. The effect of this medicine may last a long time after you get your last injection. What side effects may I notice from receiving this medicine? Side effects that you should report to your doctor or health care professional as soon as possible:  allergic reactions like skin rash, itching or hives, swelling of the face, lips, or tongue  breast tenderness or discharge  breathing problems  changes in vision  depression  feeling faint or lightheaded, falls  fever  pain in the abdomen, chest, groin, or leg  problems with balance, talking, walking  unusually weak or tired  yellowing of the eyes or skin Side effects that usually do not require medical attention (report to your doctor or health care professional if they continue or are bothersome):  acne  fluid retention and swelling  headache  irregular periods, spotting, or absent periods  temporary pain, itching, or skin reaction at site where injected  weight gain This list may not describe all possible side effects. Call your doctor for medical advice about side effects. You may report side effects to FDA at 1-800-FDA-1088. Where should I keep my medicine? This does not apply. The injection will be given to you by a health care professional. NOTE: This sheet is a summary. It may not cover all possible information. If you have questions about this medicine, talk to your doctor, pharmacist, or health care provider.  2020 Elsevier/Gold Standard (2008-05-15 18:37:56)  

## 2019-12-31 ENCOUNTER — Encounter: Payer: Self-pay | Admitting: Obstetrics & Gynecology

## 2019-12-31 LAB — GLUCOSE TOLERANCE, 2 HOURS
Glucose, 2 hour: 89 mg/dL (ref 65–139)
Glucose, GTT - Fasting: 84 mg/dL (ref 65–99)

## 2020-01-05 DIAGNOSIS — F99 Mental disorder, not otherwise specified: Secondary | ICD-10-CM | POA: Diagnosis not present

## 2020-01-14 DIAGNOSIS — F99 Mental disorder, not otherwise specified: Secondary | ICD-10-CM | POA: Diagnosis not present

## 2020-01-19 DIAGNOSIS — F4323 Adjustment disorder with mixed anxiety and depressed mood: Secondary | ICD-10-CM | POA: Diagnosis not present

## 2020-01-26 DIAGNOSIS — F411 Generalized anxiety disorder: Secondary | ICD-10-CM | POA: Diagnosis not present

## 2020-02-02 DIAGNOSIS — F4323 Adjustment disorder with mixed anxiety and depressed mood: Secondary | ICD-10-CM | POA: Diagnosis not present

## 2020-02-09 DIAGNOSIS — F4323 Adjustment disorder with mixed anxiety and depressed mood: Secondary | ICD-10-CM | POA: Diagnosis not present

## 2020-02-16 DIAGNOSIS — F4322 Adjustment disorder with anxiety: Secondary | ICD-10-CM | POA: Diagnosis not present

## 2020-02-23 DIAGNOSIS — F4322 Adjustment disorder with anxiety: Secondary | ICD-10-CM | POA: Diagnosis not present

## 2020-03-01 DIAGNOSIS — F4322 Adjustment disorder with anxiety: Secondary | ICD-10-CM | POA: Diagnosis not present

## 2020-03-17 DIAGNOSIS — F4322 Adjustment disorder with anxiety: Secondary | ICD-10-CM | POA: Diagnosis not present

## 2020-03-23 ENCOUNTER — Ambulatory Visit: Payer: Medicaid Other

## 2020-03-24 DIAGNOSIS — F4322 Adjustment disorder with anxiety: Secondary | ICD-10-CM | POA: Diagnosis not present

## 2020-04-07 DIAGNOSIS — F4322 Adjustment disorder with anxiety: Secondary | ICD-10-CM | POA: Diagnosis not present

## 2020-05-19 DIAGNOSIS — F4322 Adjustment disorder with anxiety: Secondary | ICD-10-CM | POA: Diagnosis not present

## 2020-06-21 ENCOUNTER — Other Ambulatory Visit: Payer: Self-pay

## 2020-06-21 ENCOUNTER — Ambulatory Visit (HOSPITAL_COMMUNITY)
Admission: EM | Admit: 2020-06-21 | Discharge: 2020-06-21 | Disposition: A | Discharge status: Home / Self Care | Payer: Medicaid Other | Attending: Emergency Medicine | Admitting: Emergency Medicine

## 2020-06-21 DIAGNOSIS — R55 Syncope and collapse: Secondary | ICD-10-CM

## 2020-06-21 DIAGNOSIS — R519 Headache, unspecified: Secondary | ICD-10-CM

## 2020-06-21 DIAGNOSIS — R42 Dizziness and giddiness: Secondary | ICD-10-CM

## 2020-06-21 LAB — CBG MONITORING, ED: Glucose-Capillary: 88 mg/dL (ref 70–99)

## 2020-06-21 LAB — POC URINE PREG, ED: Preg Test, Ur: NEGATIVE

## 2020-06-21 LAB — POCT URINALYSIS DIPSTICK, ED / UC
Bilirubin Urine: NEGATIVE
Glucose, UA: NEGATIVE mg/dL
Hgb urine dipstick: NEGATIVE
Ketones, ur: NEGATIVE mg/dL
Nitrite: NEGATIVE
Protein, ur: NEGATIVE mg/dL
Specific Gravity, Urine: >=1.03 (ref 1.005–1.030)
Urobilinogen, UA: 0.2 mg/dL (ref 0.0–1.0)
pH: 6 (ref 5.0–8.0)

## 2020-06-21 MED ORDER — DEXAMETHASONE SODIUM PHOSPHATE 10 MG/ML IJ SOLN
10.0000 mg | Freq: Once | INTRAMUSCULAR | Status: AC
  Administered 2020-06-21: 10 mg via INTRAMUSCULAR

## 2020-06-21 MED ORDER — KETOROLAC TROMETHAMINE 30 MG/ML IJ SOLN
INTRAMUSCULAR | Status: AC
  Filled 2020-06-21: qty 1

## 2020-06-21 MED ORDER — KETOROLAC TROMETHAMINE 30 MG/ML IJ SOLN
30.0000 mg | Freq: Once | INTRAMUSCULAR | Status: AC
  Administered 2020-06-21: 30 mg via INTRAMUSCULAR

## 2020-06-21 MED ORDER — ACETAMINOPHEN 325 MG PO TABS
ORAL_TABLET | ORAL | Status: AC
  Filled 2020-06-21: qty 3

## 2020-06-21 MED ORDER — DEXAMETHASONE SODIUM PHOSPHATE 10 MG/ML IJ SOLN
INTRAMUSCULAR | Status: AC
  Filled 2020-06-21: qty 1

## 2020-06-21 MED ORDER — ACETAMINOPHEN 325 MG PO TABS
975.0000 mg | ORAL_TABLET | Freq: Once | ORAL | Status: AC
  Administered 2020-06-21: 975 mg via ORAL

## 2020-06-21 MED ORDER — IBUPROFEN 600 MG PO TABS
600.0000 mg | ORAL_TABLET | Freq: Four times a day (QID) | ORAL | 0 refills | Status: DC | PRN
Start: 2020-06-21 — End: 2020-12-19

## 2020-06-21 MED ORDER — MECLIZINE HCL 25 MG PO TABS
25.0000 mg | ORAL_TABLET | Freq: Three times a day (TID) | ORAL | 0 refills | Status: DC | PRN
Start: 2020-06-21 — End: 2021-03-07

## 2020-06-21 MED ORDER — PROCHLORPERAZINE MALEATE 10 MG PO TABS
10.0000 mg | ORAL_TABLET | Freq: Three times a day (TID) | ORAL | 0 refills | Status: DC | PRN
Start: 2020-06-21 — End: 2021-03-07

## 2020-06-21 NOTE — ED Provider Notes (Addendum)
HPI  SUBJECTIVE:  Martha Hall is a 25 y.o. female who presents with 2 issues.  She reports 3 days of intermittent episodes of dizziness described as vertigo that last about a minute and then resolve.  She states that she felt as if she was "about almost passed out" 2 days ago, but did not lose consciousness.  States that she was having vertigo before this episode.  She reports nausea during this time.  No chest pain, shortness of breath, diaphoresis, tenderness, tunnel vision.  She also reports a gradual onset constant daily frontal headache for the past week.  Mild photophobia.  No phonophobia.  No fevers, visual changes, visual loss, ear pain, jaw pain, dental pain, arm or leg weakness, facial droop, vomiting, neck stiffness.  It did not happen during exertion.  She states that this headache is "irritating", and that it is not getting worse.  States that she has had identical headaches and vertigo like this before when she was pregnant.  She has tried 1000 mg Tylenol every 6 hours without improvement in her symptoms.  No antipyretic in the past 6 hours.  The vertigo is better with lying down, no alleviating factors for the headache.  There are no aggravating factors for the vertigo-is not associated with head rotation, positional changes.  Her headache is aggravated with bright lights.  She states that she had a "blood clot in her brain" when she was born, and it has been stented/coiled.  Has had no issues since.  No history of syncope, seizures, atrial fibrillation, arrhythmia, migraines, hypertension, stroke, PE, DVT.  She has a history of gestational diabetes, vertigo.  LMP: 2/2.  Denies possibility being pregnant.  PMD: None    Past Medical History:  Diagnosis Date  . GDM (gestational diabetes mellitus) 09/2019   Normal 2 hr GTT  . Hydrocephalus (HCC)    hx of  . Infection    UTI    Past Surgical History:  Procedure Laterality Date  . BRAIN SURGERY  March 15, 1998   stint placed when born     Family History  Problem Relation Age of Onset  . Healthy Mother   . Healthy Father     Social History   Tobacco Use  . Smoking status: Never Smoker  . Smokeless tobacco: Never Used  Vaping Use  . Vaping Use: Never used  Substance Use Topics  . Alcohol use: Never  . Drug use: Never    No current facility-administered medications for this encounter.  Current Outpatient Medications:  .  ibuprofen (ADVIL) 600 MG tablet, Take 1 tablet (600 mg total) by mouth every 6 (six) hours as needed., Disp: 30 tablet, Rfl: 0 .  meclizine (ANTIVERT) 25 MG tablet, Take 1 tablet (25 mg total) by mouth 3 (three) times daily as needed for dizziness., Disp: 30 tablet, Rfl: 0 .  prochlorperazine (COMPAZINE) 10 MG tablet, Take 1 tablet (10 mg total) by mouth every 8 (eight) hours as needed for nausea or vomiting., Disp: 10 tablet, Rfl: 0 .  Blood Pressure Monitoring (BLOOD PRESSURE KIT) DEVI, 1 kit by Does not apply route once a week. Check BP regularly and record readings into the Babyscripts App.  Large Cuff.  DX O90.0 (Patient not taking: Reported on 12/30/2019), Disp: 1 each, Rfl: 0 .  famotidine (PEPCID) 20 MG tablet, TAKE 1 TABLET BY MOUTH TWICE A DAY (Patient not taking: Reported on 12/30/2019), Disp: 60 tablet, Rfl: 5 .  medroxyPROGESTERone (DEPO-PROVERA) 150 MG/ML injection, Inject 1 mL (150 mg  total) into the muscle every 3 (three) months., Disp: 1 mL, Rfl: 0 .  pantoprazole (PROTONIX) 40 MG tablet, Take 1 tablet (40 mg total) by mouth daily. (Patient not taking: Reported on 12/30/2019), Disp: 30 tablet, Rfl: 1 .  polyethylene glycol powder (GLYCOLAX/MIRALAX) 17 GM/SCOOP powder, Take 17 g by mouth daily as needed. (Patient not taking: Reported on 12/30/2019), Disp: 510 g, Rfl: 1 .  Prenatal Vit-Fe Fumarate-FA (PRENATAL VITAMIN) 27-0.8 MG TABS, Take 1 tablet by mouth daily., Disp: 30 tablet, Rfl: 11  Allergies  Allergen Reactions  . Metoclopramide Itching     ROS  As noted in HPI.    Physical Exam  BP 132/74 (BP Location: Right Arm)   Pulse 65   Temp 98.5 F (36.9 C) (Oral)   Resp 18   LMP 06/09/2020   SpO2 100%   Constitutional: Well developed, well nourished, no acute distress Eyes:  EOMI, conjunctiva normal bilaterally PERRLA, no photophobia. HENT: Normocephalic, atraumatic,mucus membranes moist.  TMs normal bilaterally.  No TMJ tenderness.  Dentition normal bilaterally.  No nasal congestion.  No sinus tenderness. Neck: No cervical lymphadenopathy, meningismus, trapezial tenderness Respiratory: Normal inspiratory effort, lungs clear bilaterally  cardiovascular: Normal rate regular rhythm no murmurs rubs or gallops.  No carotid bruits GI: nondistended skin: No rash, skin intact Musculoskeletal: no deformities Neurologic: Alert & oriented x 3, cranial nerves III through XII intact.  Finger-nose, heel shin within normal limits.  Tandem gait steady.  Romberg negative.  Dix-Hallpike negative bilaterally. Psychiatric: Speech and behavior appropriate   ED Course   Medications  acetaminophen (TYLENOL) tablet 975 mg (975 mg Oral Given 06/21/20 1137)  ketorolac (TORADOL) 30 MG/ML injection 30 mg (30 mg Intramuscular Given 06/21/20 1139)  dexamethasone (DECADRON) injection 10 mg (10 mg Intramuscular Given 06/21/20 1137)    Orders Placed This Encounter  Procedures  . POC urine pregnancy    Standing Status:   Standing    Number of Occurrences:   1  . POC Urinalysis dipstick    Standing Status:   Standing    Number of Occurrences:   1  . POC CBG monitoring    Standing Status:   Standing    Number of Occurrences:   1  . ED EKG    Standing Status:   Standing    Number of Occurrences:   1    Order Specific Question:   Reason for Exam    Answer:   Syncope    Results for orders placed or performed during the hospital encounter of 06/21/20 (from the past 24 hour(s))  POC CBG monitoring     Status: None   Collection Time: 06/21/20 10:19 AM  Result Value Ref  Range   Glucose-Capillary 88 70 - 99 mg/dL  POC Urinalysis dipstick     Status: Abnormal   Collection Time: 06/21/20 10:44 AM  Result Value Ref Range   Glucose, UA NEGATIVE NEGATIVE mg/dL   Bilirubin Urine NEGATIVE NEGATIVE   Ketones, ur NEGATIVE NEGATIVE mg/dL   Specific Gravity, Urine >=1.030 1.005 - 1.030   Hgb urine dipstick NEGATIVE NEGATIVE   pH 6.0 5.0 - 8.0   Protein, ur NEGATIVE NEGATIVE mg/dL   Urobilinogen, UA 0.2 0.0 - 1.0 mg/dL   Nitrite NEGATIVE NEGATIVE   Leukocytes,Ua TRACE (A) NEGATIVE  POC urine pregnancy     Status: None   Collection Time: 06/21/20 10:47 AM  Result Value Ref Range   Preg Test, Ur NEGATIVE NEGATIVE   No results found.  ED  Clinical Impression  1. Acute nonintractable headache, unspecified headache type   2. Vertigo   3. Near syncope      ED Assessment/Plan  EKG: Sinus bradycardia, rate 59.  Normal axis, normal intervals.  No hypertrophy.  No ST elevation.  Isolated T wave inversion in 3.  No other reciprocal changes.   Patient is not pregnant.  She has trace leukocytes in urine, but she has no urinary complaints.  Will not culture or treat this.  1.  Headache for the past week pt describing typical pain, no sudden onset. Doubt SAH, ICH or space occupying lesion. Pt without fevers/chills, Pt has no meningeal sx, no nuchal rigidity. Doubt meningitis. Pt with normal neuro exam, no evidence of CVA/TIA.  Pt BP not elevated significantly, doubt hypertensive emergency. No evidence of temporal artery tenderness, no evidence of glaucoma or other ocular pathology. Will give headache cocktail (dexamethasone 10 IM, toradol 30 IM, Tylenol 975 mg p.o. and reassess.  She states the Zofran makes her nausea worse and she reports allergy to [place]  Work note for 2 days  Pt improving after medications. Pt with continued non-focal neuro exam. Will d/c home with ibuprofen/Tylenol, Compazine, and have pt F/U with PCP of choice.  Will provide primary care list and  order a primary care referral.  2.  Dizziness/vertigo-sounds like this is a peripheral, not a central vertigo especially with normal neuro exam.  Fingerstick 88.  UA with leukocytes, but she has no urinary complaints.  Will not treat this.  Urine pregnancy negative.  She denies syncopal episode.  Will send home with meclizine, continue pushing electrolyte containing fluids.  Discussed labs, MDM, plan for follow up, signs and sx that should prompt return to ER. Pt agrees with plan.     3.  Presyncope.  EKG with isolated T wave inversion in 3, however no reciprocal changes.  There is no ST elevation or arrhythmia.  Glucose is normal, she is not pregnant.  will have her continue pushing fluids as urine is concentrated.    Discussed medical decision-making, treatment plan and plan for follow-up with patient.  Discussed signs and symptoms that should prompt return to emergency department.  Patient agrees with plan.  Meds ordered this encounter  Medications  . acetaminophen (TYLENOL) tablet 975 mg  . ketorolac (TORADOL) 30 MG/ML injection 30 mg  . dexamethasone (DECADRON) injection 10 mg  . ibuprofen (ADVIL) 600 MG tablet    Sig: Take 1 tablet (600 mg total) by mouth every 6 (six) hours as needed.    Dispense:  30 tablet    Refill:  0  . prochlorperazine (COMPAZINE) 10 MG tablet    Sig: Take 1 tablet (10 mg total) by mouth every 8 (eight) hours as needed for nausea or vomiting.    Dispense:  10 tablet    Refill:  0  . meclizine (ANTIVERT) 25 MG tablet    Sig: Take 1 tablet (25 mg total) by mouth 3 (three) times daily as needed for dizziness.    Dispense:  30 tablet    Refill:  0    *This clinic note was created using Lobbyist. Therefore, there may be occasional mistakes despite careful proofreading.   ?;Pennie Rushing, MD 06/21/20 1515    Melynda Ripple, MD 06/21/20 1515
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
## 2020-06-21 NOTE — Discharge Instructions (Signed)
Take 600 mg of ibuprofen combined with 1000 mg of Tylenol 3-4 times a day together as needed for headache.  The Compazine will help with headache.  You may take 25 to 50 mg of Benadryl if the Compazine makes you feel jittery.  Meclizine for the dizziness.  Go immediately to the emergency department if your headache changes, gets worse, for any signs of a stroke, if your dizziness does not get better with meclizine, or for any other concerns  Below is a list of primary care practices who are taking new patients for you to follow-up with.  [facility] internal medicine clinic Ground Floor - [facility], [address] [place], [place], [place] [address] [phone]  [facility] at [facility] [address] [place], [place] [address] [phone]  [facility] [address] [place], [place] [address] [phone]  [facility] [phone] [address] [place] [address]  [facility]: [address] [place] [address]  [phone]  [facility]: [address] [place] [address]   [phone]  [facility]: [address] [place] [address]  [phone]  [facility] : [address] [place] [address] [phone]  [facility]: [address] [place] [address] [phone]  [facility]: [address] [place] [address] [phone]  Dr. Oneal Grout [address] [facility] [place] [address]  [phone]  Dr. Jackie Plum, [facility]. [address] [place], [place] [address]  [phone]  Go to www.goodrx.com to look up your medications. This will give you a list of where you can find your  prescriptions at the most affordable prices. Or ask the pharmacist what the cash price is, or if they have any other discount programs available to help make your medication more affordable. This can be less expensive than what you would pay with insurance.

## 2020-06-23 DIAGNOSIS — F4321 Adjustment disorder with depressed mood: Secondary | ICD-10-CM | POA: Diagnosis not present

## 2020-08-06 DIAGNOSIS — F4323 Adjustment disorder with mixed anxiety and depressed mood: Secondary | ICD-10-CM | POA: Diagnosis not present

## 2020-08-09 ENCOUNTER — Ambulatory Visit: Payer: Medicaid Other | Admitting: Family

## 2020-08-19 DIAGNOSIS — F4323 Adjustment disorder with mixed anxiety and depressed mood: Secondary | ICD-10-CM | POA: Diagnosis not present

## 2020-09-02 DIAGNOSIS — F4323 Adjustment disorder with mixed anxiety and depressed mood: Secondary | ICD-10-CM | POA: Diagnosis not present

## 2020-09-18 ENCOUNTER — Other Ambulatory Visit: Payer: Self-pay | Admitting: Obstetrics and Gynecology

## 2020-10-07 ENCOUNTER — Other Ambulatory Visit: Payer: Self-pay | Admitting: Obstetrics and Gynecology

## 2020-11-21 ENCOUNTER — Other Ambulatory Visit: Payer: Self-pay

## 2020-11-21 ENCOUNTER — Emergency Department (HOSPITAL_COMMUNITY)
Admission: EM | Admit: 2020-11-21 | Discharge: 2020-11-22 | Disposition: A | Discharge status: Home / Self Care | Payer: Medicaid Other | Attending: Emergency Medicine | Admitting: Emergency Medicine

## 2020-11-21 ENCOUNTER — Encounter (HOSPITAL_COMMUNITY): Payer: Self-pay | Admitting: Emergency Medicine

## 2020-11-21 DIAGNOSIS — R519 Headache, unspecified: Secondary | ICD-10-CM | POA: Diagnosis not present

## 2020-11-21 DIAGNOSIS — R110 Nausea: Secondary | ICD-10-CM | POA: Insufficient documentation

## 2020-11-21 DIAGNOSIS — Z5321 Procedure and treatment not carried out due to patient leaving prior to being seen by health care provider: Secondary | ICD-10-CM | POA: Diagnosis not present

## 2020-11-21 DIAGNOSIS — Y92410 Unspecified street and highway as the place of occurrence of the external cause: Secondary | ICD-10-CM | POA: Insufficient documentation

## 2020-11-21 DIAGNOSIS — M549 Dorsalgia, unspecified: Secondary | ICD-10-CM | POA: Diagnosis present
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
## 2020-11-22 ENCOUNTER — Ambulatory Visit
Admission: EM | Admit: 2020-11-22 | Discharge: 2020-11-22 | Disposition: A | Discharge status: Home / Self Care | Payer: Medicaid Other | Attending: Physician Assistant | Admitting: Physician Assistant

## 2020-11-22 ENCOUNTER — Other Ambulatory Visit: Payer: Self-pay

## 2020-11-22 ENCOUNTER — Encounter: Payer: Self-pay | Admitting: Emergency Medicine

## 2020-11-22 DIAGNOSIS — M545 Low back pain, unspecified: Secondary | ICD-10-CM

## 2020-11-22 LAB — POCT URINE PREGNANCY: Preg Test, Ur: NEGATIVE
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
## 2020-11-22 NOTE — ED Notes (Signed)
Patient called multiple times by registration with no response and not visible in lobby
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
## 2020-11-22 NOTE — Discharge Instructions (Addendum)
Return if any problems.

## 2020-12-19 ENCOUNTER — Ambulatory Visit (HOSPITAL_COMMUNITY)
Admission: EM | Admit: 2020-12-19 | Discharge: 2020-12-19 | Disposition: A | Discharge status: Home / Self Care | Payer: Medicaid Other | Attending: Student | Admitting: Student

## 2020-12-19 ENCOUNTER — Encounter (HOSPITAL_COMMUNITY): Payer: Self-pay

## 2020-12-19 DIAGNOSIS — S46912A Strain of unspecified muscle, fascia and tendon at shoulder and upper arm level, left arm, initial encounter: Secondary | ICD-10-CM

## 2020-12-19 MED ORDER — IBUPROFEN 800 MG PO TABS
800.0000 mg | ORAL_TABLET | Freq: Three times a day (TID) | ORAL | 0 refills | Status: DC
Start: 2020-12-19 — End: 2021-01-31

## 2020-12-19 MED ORDER — TIZANIDINE HCL 4 MG PO TABS
4.0000 mg | ORAL_TABLET | Freq: Three times a day (TID) | ORAL | 0 refills | Status: DC | PRN
Start: 2020-12-19 — End: 2022-04-04

## 2020-12-19 NOTE — Discharge Instructions (Addendum)
-  I sent a prescription for ibuprofen. You can take Tylenol up to 1000 mg 3 times daily, and ibuprofen up to 800 mg 3 times daily with food.  You can take these together, or alternate every 3-4 hours. -Start the muscle relaxer-Zanaflex (tizanidine), up to 3 times daily for muscle spasms and pain.  This can make you drowsy, so take at bedtime or when you do not need to drive or operate machinery. -Heating pad/warm shower -Use your sling while you are having pain, you can reduce the use of this as her symptoms improve.

## 2020-12-19 NOTE — ED Triage Notes (Signed)
Pt reports left shoulder pain since this morning. Reports she was laying down in bed and felt a pop in the neck and pain stared and was not abel to moved the left arm.   Pt took muscle relaxer 0900 am today.

## 2020-12-19 NOTE — ED Provider Notes (Signed)
[facility]    CSN: [id number] Arrival date & time: 12/19/20  1206      History   Chief Complaint Chief Complaint  Patient presents with   Shoulder Pain    [name] is a 25 y.o. female presenting with left shoulder pain for about 4 hours, since rolling over in bed and hearing a pop in the shoulder.  Medical history noncontributory.  Patient states that she rolled over in bed this morning, heard a pop in the left posterior shoulder and since then has had pain over this aspect of the shoulder.  Pain is worse with movement of the arm.  Denies sensation changes, denies pain radiating down the arm.  Tried a muscle relaxer that she had at home with minimal improvement.  She is left-handed.   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 Authorizing Provider  ibuprofen (ADVIL) 800 MG  tablet Take 1 tablet (800 mg total) by mouth 3 (three) times daily. 12/19/20  Yes Rhys Martini, PA-C  tiZANidine (ZANAFLEX) 4 MG tablet Take 1 tablet (4 mg total) by mouth every 8 (eight) hours as needed for muscle spasms. 12/19/20  Yes Rhys Martini, PA-C  meclizine (ANTIVERT) 25 MG tablet Take 1 tablet (25 mg total) by mouth 3 (three) times daily as needed for dizziness. 06/21/20   Domenick Gong, MD  medroxyPROGESTERone (DEPO-PROVERA) 150 MG/ML injection Inject 1 mL (150 mg total) into the muscle every 3 (three) months. 12/30/19   Johnny Bridge, MD  Prenatal Vit-Fe Fumarate-FA (PRENATAL VITAMIN) 27-0.8 MG TABS Take 1 tablet by mouth daily. 07/16/19   Fair, Hoyle Sauer, MD  prochlorperazine (COMPAZINE) 10 MG tablet Take 1 tablet (10 mg total) by mouth every 8 (eight) hours as needed for nausea or vomiting. 06/21/20   Domenick Gong, MD  diphenhydrAMINE (BENADRYL) 25 MG tablet Take 12.5 mg by mouth every 6 (six) hours as needed for allergies. Patient not taking: Reported on 12/30/2019  06/21/20  [provider]    Family History Family History  Problem Relation Age of Onset   Healthy  Mother    Healthy Father     Social History Social History   Tobacco Use   Smoking status: Never   Smokeless tobacco: Never  Vaping Use   Vaping Use: Never used  Substance Use Topics   Alcohol use: Never   Drug use: Never     Allergies   Metoclopramide   Review of Systems Review of Systems  Musculoskeletal:        L shoulder pain  All other systems reviewed and are negative.   Physical Exam Triage Vital Signs ED Triage Vitals  Enc Vitals Group     BP 12/19/20 1320 127/72     Pulse Rate 12/19/20 1320 92     Resp 12/19/20 1320 18     Temp --      Temp Source 12/19/20 1320 Oral     SpO2 12/19/20 1320 100 %     Weight --      Height --      Head Circumference --      Peak Flow --      Pain Score 12/19/20 1317 9     Pain Loc --      Pain Edu? --      Excl. in GC? --     No data found.  Updated Vital Signs BP 127/72 (BP Location: Right Arm)   Pulse 92   Resp 18   LMP  (Within Weeks) Comment: 3 weeks  SpO2 100%   Breastfeeding No   Visual Acuity Right Eye Distance:   Left Eye Distance:   Bilateral Distance:    Right Eye Near:   Left Eye Near:    Bilateral Near:     Physical Exam Vitals reviewed.  Constitutional:      General: She is not in acute distress.    Appearance: Normal appearance. She is not ill-appearing or diaphoretic.  HENT:     Head: Normocephalic and atraumatic.  Cardiovascular:     Rate and Rhythm: Normal rate and regular rhythm.     Heart sounds: Normal heart sounds.  Pulmonary:     Effort: Pulmonary effort is normal.     Breath sounds: Normal breath sounds.  Musculoskeletal:     Comments: L shoulder-mild pain to palpation over the left proximal trapezius, without obvious bony deformity or effusion. No clavicular or AC joint tenderness. Patient is moving her arm without pain. Exam with pain out of proportion.  Abduction, adduction intact, negative cross body adduction, Neer, Hawkins, Beer.  Grip strength 5/5, sensation intact, radial pulse 2+, cap refill less than 2 seconds.  Skin:    General: Skin is warm.  Neurological:     General: No focal deficit present.     Mental Status: She is alert and oriented to person, place, and time.  Psychiatric:        Mood and Affect: Mood normal.        Behavior: Behavior normal.        Thought Content: Thought content normal.        Judgment: Judgment normal.     UC Treatments / Results  Labs (all labs ordered are listed, but only abnormal results are displayed) Labs Reviewed - No data to display  EKG   Radiology No results found.  Procedures Procedures (including critical care time)  Medications Ordered in UC Medications - No data to display  Initial Impression / Assessment and Plan / UC Course  I have reviewed the triage vital signs and the nursing  notes.  Pertinent labs & imaging results that were available during my care of the patient were reviewed by me and considered in my medical decision making (see chart for details).     This patient is a very pleasant 25 y.o. year old female presenting with L trapezius strain. No red flag symptoms. Zanaflex, ibuprofen. Sling and AC wrap provided at patient request. States not pregnant or breastfeeding. ED return precautions discussed. Patient verbalizes understanding and agreement.     Final Clinical Impressions(s) / UC Diagnoses   Final diagnoses:  Strain of left shoulder, initial encounter     Discharge Instructions      -I sent a prescription for ibuprofen. You can take Tylenol up to 1000 mg 3 times daily, and ibuprofen up to 800 mg 3 times daily with food.  You can take these together, or alternate every 3-4 hours. -Start the muscle relaxer-Zanaflex (tizanidine), up to 3 times daily for muscle spasms and pain.  This can make you drowsy, so take at bedtime or when you do not need to drive or operate machinery. -Heating pad/warm shower -Use your sling while you are having pain, you can reduce the use of this as her symptoms improve.      ED Prescriptions     Medication Sig Dispense Auth. Provider   tiZANidine (ZANAFLEX) 4 MG tablet Take 1 tablet (4 mg total) by mouth every 8 (eight) hours as needed for muscle spasms. 30 tablet Rhys Martini, PA-C   ibuprofen (ADVIL) 800 MG tablet Take 1 tablet (800 mg total) by mouth 3 (three) times daily. 21 tablet Rhys Martini, PA-C      PDMP not reviewed this encounter.   Rhys Martini, PA-C 12/19/20 1349

## 2021-01-21 ENCOUNTER — Other Ambulatory Visit: Payer: Self-pay

## 2021-01-21 ENCOUNTER — Ambulatory Visit
Admission: EM | Admit: 2021-01-21 | Discharge: 2021-01-21 | Disposition: A | Discharge status: Home / Self Care | Payer: Medicaid Other | Attending: Urgent Care | Admitting: Urgent Care

## 2021-01-21 DIAGNOSIS — R112 Nausea with vomiting, unspecified: Secondary | ICD-10-CM

## 2021-01-21 DIAGNOSIS — R1010 Upper abdominal pain, unspecified: Secondary | ICD-10-CM | POA: Insufficient documentation

## 2021-01-21 DIAGNOSIS — N3001 Acute cystitis with hematuria: Secondary | ICD-10-CM | POA: Diagnosis not present

## 2021-01-21 LAB — POCT URINE PREGNANCY: Preg Test, Ur: NEGATIVE

## 2021-01-21 LAB — POCT URINALYSIS DIP (MANUAL ENTRY)
Glucose, UA: NEGATIVE mg/dL
Nitrite, UA: POSITIVE — AB
Protein Ur, POC: >=300 mg/dL — AB
Spec Grav, UA: 1.015 (ref 1.010–1.025)
Urobilinogen, UA: 4 E.U./dL — AB
pH, UA: 8.5 — AB (ref 5.0–8.0)

## 2021-01-21 MED ORDER — ESOMEPRAZOLE MAGNESIUM 20 MG PO CPDR: 20.0000 mg | DELAYED_RELEASE_CAPSULE | Freq: Every day | ORAL | 1 refills | Status: DC

## 2021-01-21 MED ORDER — CEPHALEXIN 500 MG PO CAPS: 500.0000 mg | ORAL_CAPSULE | Freq: Two times a day (BID) | ORAL | 0 refills | Status: DC

## 2021-01-21 MED ORDER — FAMOTIDINE 20 MG PO TABS: 20.0000 mg | ORAL_TABLET | Freq: Two times a day (BID) | ORAL | 0 refills | Status: DC

## 2021-01-21 NOTE — ED Provider Notes (Signed)
[facility]   MRN: [id number] DOB: 1995/10/26  Subjective:   Martha Hall is a 25 y.o. female presenting for 1 week history of persistent upper abdominal pain, nausea and vomiting.  Patient symptoms started out with throat pain and cough.  She is also had body aches.  Reports that she has previously had a difficult time with acid reflux and had to take 3 medications but is not now.  She is not on contraception.  Denies chest pain, shortness of breath.  The cough has improved.  Denies dysuria, hematuria, vaginal discharge.  No current facility-administered medications for this encounter.  Current Outpatient Medications:    ibuprofen (ADVIL) 800 MG tablet, Take 1 tablet (800 mg total) by mouth 3 (three) times daily., Disp: 21 tablet, Rfl: 0   meclizine (ANTIVERT) 25 MG tablet, Take 1 tablet (25 mg total) by mouth 3 (three) times daily as needed for dizziness., Disp: 30 tablet, Rfl: 0   medroxyPROGESTERone (DEPO-PROVERA) 150 MG/ML injection, Inject 1 mL (150 mg total) into the muscle every 3 (three) months., Disp: 1 mL, Rfl: 0   Prenatal Vit-Fe Fumarate-FA (PRENATAL VITAMIN) 27-0.8 MG TABS, Take 1 tablet by mouth daily., Disp: 30 tablet, Rfl: 11   prochlorperazine (COMPAZINE) 10 MG tablet, Take 1 tablet (10 mg total) by mouth every 8 (eight) hours as needed for nausea or vomiting., Disp: 10 tablet, Rfl: 0   tiZANidine (ZANAFLEX) 4 MG tablet, Take 1 tablet (4 mg total) by mouth every 8 (eight) hours as needed for muscle spasms., Disp: 30 tablet, Rfl: 0    Allergies  Allergen Reactions   Metoclopramide Itching    Past Medical History:  Diagnosis Date   GDM (gestational diabetes mellitus) 09/2019   Normal 2 hr GTT   Hydrocephalus (HCC)    hx of   Infection    UTI     Past Surgical History:  Procedure Laterality Date   BRAIN SURGERY  August 04, 1997   stint placed when born    Family History  Problem Relation Age of Onset   Healthy Mother    Healthy Father     Social  History   Tobacco Use   Smoking status: Never   Smokeless tobacco: Never  Vaping Use   Vaping Use: Never used  Substance Use Topics   Alcohol use: Never   Drug use: Never    ROS   Objective:   Vitals: BP 104/72 (BP Location: Left Arm)   Pulse 87   Temp 98.1 F (36.7 C) (Oral)   Resp 18   LMP 01/17/2021 (Approximate)   SpO2 100%   Breastfeeding No   Physical Exam Constitutional:      General: She is not in acute distress.    Appearance: Normal appearance. She is well-developed. She is not ill-appearing, toxic-appearing or diaphoretic.  HENT:     Head: Normocephalic and atraumatic.     Nose: Nose normal.     Mouth/Throat:     Mouth: Mucous membranes are moist.     Pharynx: Oropharynx is clear.  Eyes:     General: No scleral icterus.       Right eye: No discharge.        Left eye: No discharge.     Extraocular Movements: Extraocular movements intact.     Conjunctiva/sclera: Conjunctivae normal.     Pupils: Pupils are equal, round, and reactive to light.  Cardiovascular:     Rate and Rhythm: Normal rate and regular rhythm.     Pulses: Normal  pulses.     Heart sounds: Normal heart sounds. No murmur heard.   No friction rub. No gallop.  Pulmonary:     Effort: Pulmonary effort is normal. No respiratory distress.     Breath sounds: Normal breath sounds. No stridor. No wheezing, rhonchi or rales.  Abdominal:     General: Bowel sounds are normal. There is no distension.     Palpations: Abdomen is soft. There is no mass.     Tenderness: There is generalized abdominal tenderness (mild) and tenderness in the epigastric area and periumbilical area. There is no right CVA tenderness, left CVA tenderness, guarding or rebound.  Skin:    General: Skin is warm and dry.     Findings: No rash.  Neurological:     General: No focal deficit present.     Mental Status: She is alert and oriented to person, place, and time.  Psychiatric:        Mood and Affect: Mood normal.         Behavior: Behavior normal.        Thought Content: Thought content normal.        Judgment: Judgment normal.    Results for orders placed or performed during the hospital encounter of 01/21/21 (from the past 24 hour(s))  POCT urine pregnancy     Status: None   Collection Time: 01/21/21 11:52 AM  Result Value Ref Range   Preg Test, Ur Negative Negative  POCT urinalysis dipstick     Status: Abnormal   Collection Time: 01/21/21 12:01 PM  Result Value Ref Range   Color, UA red (A) yellow   Clarity, UA cloudy (A) clear   Glucose, UA negative negative mg/dL   Bilirubin, UA moderate (A) negative   Ketones, POC UA trace (5) (A) negative mg/dL   Spec Grav, UA 7.062 3.762 - 1.025   Blood, UA large (A) negative   pH, UA 8.5 (A) 5.0 - 8.0   Protein Ur, POC >=300 (A) negative mg/dL   Urobilinogen, UA 4.0 (A) 0.2 or 1.0 E.U./dL   Nitrite, UA Positive (A) Negative   Leukocytes, UA Trace (A) Negative    Assessment and Plan :   PDMP not reviewed this encounter.  1. Acute cystitis with hematuria   2. Nausea and vomiting, intractability of vomiting not specified, unspecified vomiting type   3. Upper abdominal pain     Start Keflex to cover for acute cystitis, urine culture pending.  Recommended aggressive hydration, limiting urinary irritants.  We will also address her GI symptoms for recurrent acid reflux with Nexium and famotidine.  Counseled on dietary modifications.  No signs of an acute abdomen on exam.  Counseled patient on potential for adverse effects with medications prescribed/recommended today, ER and return-to-clinic precautions discussed, patient verbalized understanding.    Wallis Bamberg, PA-C 01/21/21 1225

## 2021-01-21 NOTE — ED Triage Notes (Signed)
Pt c/o vomiting, sore throat, cough, acid reflux, body aches, left eye "swole up". Onset last Friday. Denies headache, chills, diarrhea or constipation. Abdominal pain described 9/10 aching.  States takes ibuprofen for headaches semi-regularly.

## 2021-01-21 NOTE — Discharge Instructions (Signed)

## 2021-01-23 LAB — URINE CULTURE: Culture: >=100000 — AB

## 2021-01-24 ENCOUNTER — Observation Stay (HOSPITAL_COMMUNITY): Payer: Medicaid Other

## 2021-01-24 ENCOUNTER — Emergency Department (HOSPITAL_COMMUNITY): Payer: Medicaid Other

## 2021-01-24 ENCOUNTER — Inpatient Hospital Stay (HOSPITAL_COMMUNITY)
Admission: EM | Admit: 2021-01-24 | Discharge: 2021-01-31 | DRG: 417 | Disposition: A | Discharge status: Home / Self Care | Payer: Medicaid Other | Attending: Internal Medicine | Admitting: Internal Medicine

## 2021-01-24 ENCOUNTER — Other Ambulatory Visit: Payer: Self-pay

## 2021-01-24 ENCOUNTER — Encounter (HOSPITAL_COMMUNITY): Payer: Self-pay

## 2021-01-24 DIAGNOSIS — E669 Obesity, unspecified: Secondary | ICD-10-CM | POA: Diagnosis present

## 2021-01-24 DIAGNOSIS — K8050 Calculus of bile duct without cholangitis or cholecystitis without obstruction: Secondary | ICD-10-CM

## 2021-01-24 DIAGNOSIS — K8010 Calculus of gallbladder with chronic cholecystitis without obstruction: Secondary | ICD-10-CM | POA: Diagnosis not present

## 2021-01-24 DIAGNOSIS — K8020 Calculus of gallbladder without cholecystitis without obstruction: Secondary | ICD-10-CM | POA: Diagnosis not present

## 2021-01-24 DIAGNOSIS — K8063 Calculus of gallbladder and bile duct with acute cholecystitis with obstruction: Principal | ICD-10-CM | POA: Diagnosis present

## 2021-01-24 DIAGNOSIS — K219 Gastro-esophageal reflux disease without esophagitis: Secondary | ICD-10-CM | POA: Diagnosis present

## 2021-01-24 DIAGNOSIS — D563 Thalassemia minor: Secondary | ICD-10-CM | POA: Diagnosis present

## 2021-01-24 DIAGNOSIS — Z20822 Contact with and (suspected) exposure to covid-19: Secondary | ICD-10-CM | POA: Diagnosis present

## 2021-01-24 DIAGNOSIS — R17 Unspecified jaundice: Secondary | ICD-10-CM

## 2021-01-24 DIAGNOSIS — K661 Hemoperitoneum: Secondary | ICD-10-CM | POA: Diagnosis not present

## 2021-01-24 DIAGNOSIS — Z419 Encounter for procedure for purposes other than remedying health state, unspecified: Secondary | ICD-10-CM

## 2021-01-24 DIAGNOSIS — R1011 Right upper quadrant pain: Secondary | ICD-10-CM

## 2021-01-24 DIAGNOSIS — Z888 Allergy status to other drugs, medicaments and biological substances status: Secondary | ICD-10-CM

## 2021-01-24 DIAGNOSIS — R9431 Abnormal electrocardiogram [ECG] [EKG]: Secondary | ICD-10-CM | POA: Diagnosis not present

## 2021-01-24 DIAGNOSIS — K82A1 Gangrene of gallbladder in cholecystitis: Secondary | ICD-10-CM | POA: Diagnosis present

## 2021-01-24 DIAGNOSIS — K831 Obstruction of bile duct: Secondary | ICD-10-CM | POA: Diagnosis present

## 2021-01-24 DIAGNOSIS — N179 Acute kidney failure, unspecified: Secondary | ICD-10-CM | POA: Diagnosis not present

## 2021-01-24 DIAGNOSIS — Z87440 Personal history of urinary (tract) infections: Secondary | ICD-10-CM

## 2021-01-24 DIAGNOSIS — Z6841 Body Mass Index (BMI) 40.0 and over, adult: Secondary | ICD-10-CM

## 2021-01-24 DIAGNOSIS — K838 Other specified diseases of biliary tract: Secondary | ICD-10-CM | POA: Diagnosis not present

## 2021-01-24 DIAGNOSIS — D62 Acute posthemorrhagic anemia: Secondary | ICD-10-CM | POA: Diagnosis not present

## 2021-01-24 DIAGNOSIS — O24419 Gestational diabetes mellitus in pregnancy, unspecified control: Secondary | ICD-10-CM | POA: Diagnosis not present

## 2021-01-24 DIAGNOSIS — R109 Unspecified abdominal pain: Secondary | ICD-10-CM | POA: Diagnosis present

## 2021-01-24 DIAGNOSIS — I959 Hypotension, unspecified: Secondary | ICD-10-CM | POA: Diagnosis not present

## 2021-01-24 DIAGNOSIS — K828 Other specified diseases of gallbladder: Secondary | ICD-10-CM | POA: Diagnosis not present

## 2021-01-24 HISTORY — DX: Calculus of bile duct without cholangitis or cholecystitis without obstruction: K80.50

## 2021-01-24 HISTORY — DX: Other injury of unspecified body region, initial encounter: T14.8XXA

## 2021-01-24 HISTORY — DX: Calculus of gallbladder without cholecystitis without obstruction: K80.20

## 2021-01-24 LAB — I-STAT BETA HCG BLOOD, ED (MC, WL, AP ONLY): I-stat hCG, quantitative: <5 m[IU]/mL (ref <5)

## 2021-01-24 LAB — URINALYSIS, ROUTINE W REFLEX MICROSCOPIC
Glucose, UA: NEGATIVE mg/dL
Ketones, ur: NEGATIVE mg/dL
Nitrite: NEGATIVE
Protein, ur: NEGATIVE mg/dL
Specific Gravity, Urine: >1.03 — ABNORMAL HIGH (ref 1.005–1.030)
pH: 5.5 (ref 5.0–8.0)

## 2021-01-24 LAB — CBC
HCT: 42.2 % (ref 36.0–46.0)
Hemoglobin: 12.8 g/dL (ref 12.0–15.0)
MCH: 26.2 pg (ref 26.0–34.0)
MCHC: 30.3 g/dL (ref 30.0–36.0)
MCV: 86.5 fL (ref 80.0–100.0)
Platelets: 277 10*3/uL (ref 150–400)
RBC: 4.88 MIL/uL (ref 3.87–5.11)
RDW: 13.9 % (ref 11.5–15.5)
WBC: 4.8 10*3/uL (ref 4.0–10.5)
nRBC: 0 % (ref 0.0–0.2)

## 2021-01-24 LAB — BILIRUBIN, FRACTIONATED(TOT/DIR/INDIR)
Bilirubin, Direct: 1.6 mg/dL — ABNORMAL HIGH (ref 0.0–0.2)
Indirect Bilirubin: 1.4 mg/dL — ABNORMAL HIGH (ref 0.3–0.9)
Total Bilirubin: 3 mg/dL — ABNORMAL HIGH (ref 0.3–1.2)

## 2021-01-24 LAB — URINALYSIS, MICROSCOPIC (REFLEX)
RBC / HPF: >50 RBC/hpf (ref 0–5)
WBC, UA: >50 WBC/hpf (ref 0–5)

## 2021-01-24 LAB — COMPREHENSIVE METABOLIC PANEL
ALT: 456 U/L — ABNORMAL HIGH (ref 0–44)
AST: 142 U/L — ABNORMAL HIGH (ref 15–41)
Albumin: 3.4 g/dL — ABNORMAL LOW (ref 3.5–5.0)
Alkaline Phosphatase: 158 U/L — ABNORMAL HIGH (ref 38–126)
Anion gap: 11 (ref 5–15)
BUN: 7 mg/dL (ref 6–20)
CO2: 23 mmol/L (ref 22–32)
Calcium: 9 mg/dL (ref 8.9–10.3)
Chloride: 103 mmol/L (ref 98–111)
Creatinine, Ser: 0.84 mg/dL (ref 0.44–1.00)
GFR, Estimated: >60 mL/min (ref >60)
Glucose, Bld: 112 mg/dL — ABNORMAL HIGH (ref 70–99)
Potassium: 4.1 mmol/L (ref 3.5–5.1)
Sodium: 137 mmol/L (ref 135–145)
Total Bilirubin: 3.8 mg/dL — ABNORMAL HIGH (ref 0.3–1.2)
Total Protein: 6.8 g/dL (ref 6.5–8.1)

## 2021-01-24 LAB — SURGICAL PCR SCREEN
MRSA, PCR: NEGATIVE
Staphylococcus aureus: NEGATIVE

## 2021-01-24 LAB — RESP PANEL BY RT-PCR (FLU A&B, COVID) ARPGX2
Influenza A by PCR: NEGATIVE
Influenza B by PCR: NEGATIVE
SARS Coronavirus 2 by RT PCR: NEGATIVE

## 2021-01-24 LAB — HEPATITIS PANEL, ACUTE
HCV Ab: NONREACTIVE
Hep A IgM: NONREACTIVE
Hep B C IgM: NONREACTIVE
Hepatitis B Surface Ag: NONREACTIVE

## 2021-01-24 LAB — LIPASE, BLOOD: Lipase: 31 U/L (ref 11–51)

## 2021-01-24 IMAGING — US US ABDOMEN LIMITED
1 series · 14 of 25 positions shown · non-contrast
Comparison: None.

CLINICAL DATA: Right upper quadrant pain

EXAM:
ULTRASOUND ABDOMEN LIMITED RIGHT UPPER QUADRANT

[Series 1: us abdomen limited ruq (liver/gb) · 14 of 56 slices shown]
[im 1/56]
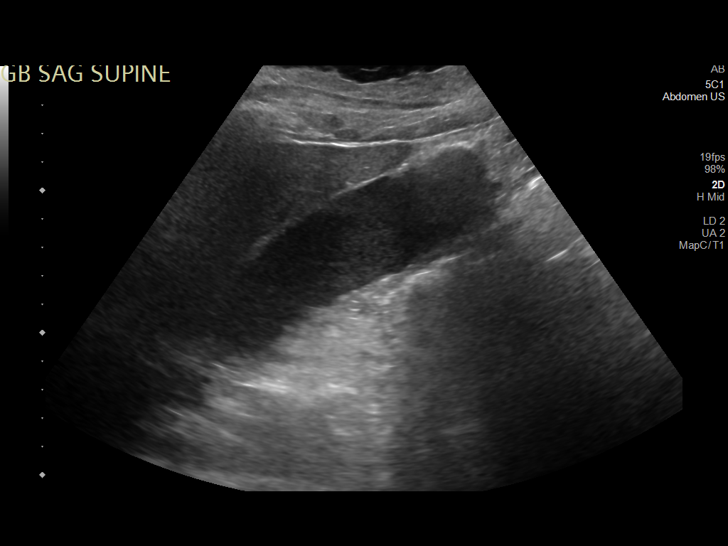
[im 5/56]
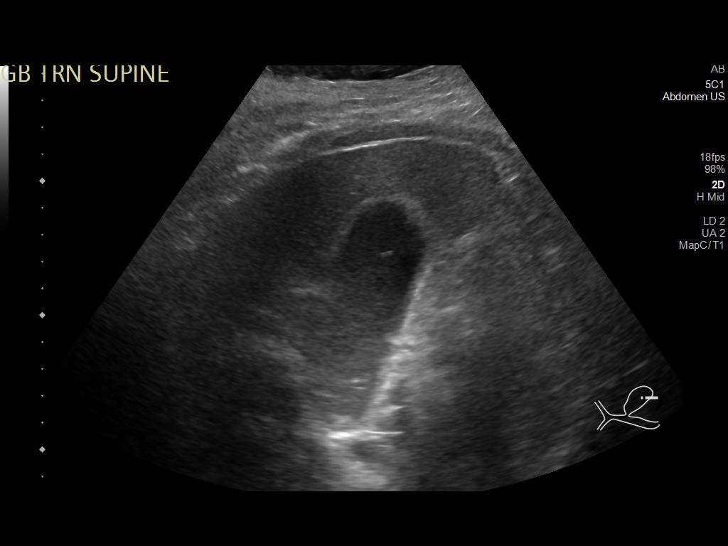
[im 10/56]
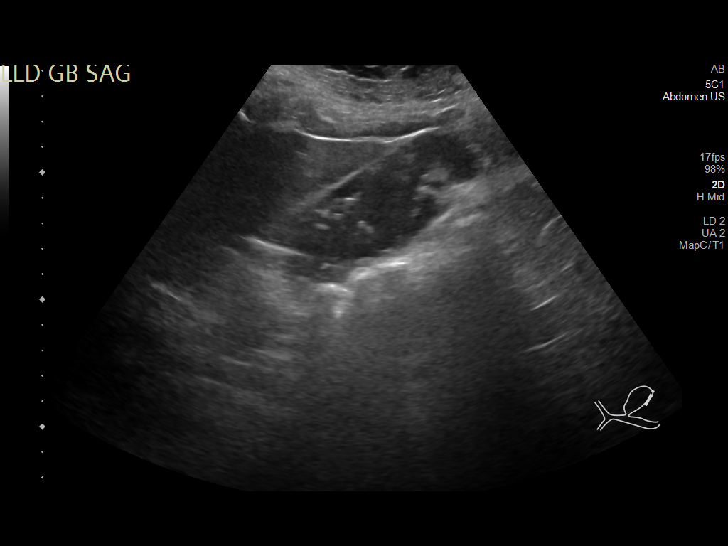
[im 14/56]
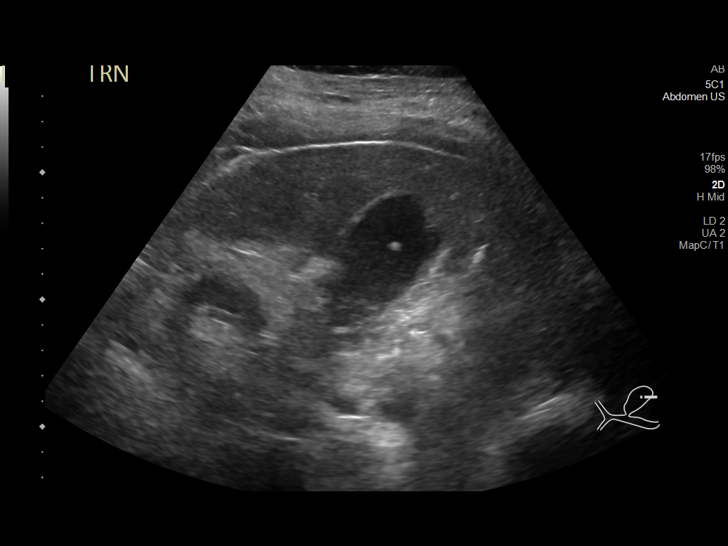
[im 19/56]
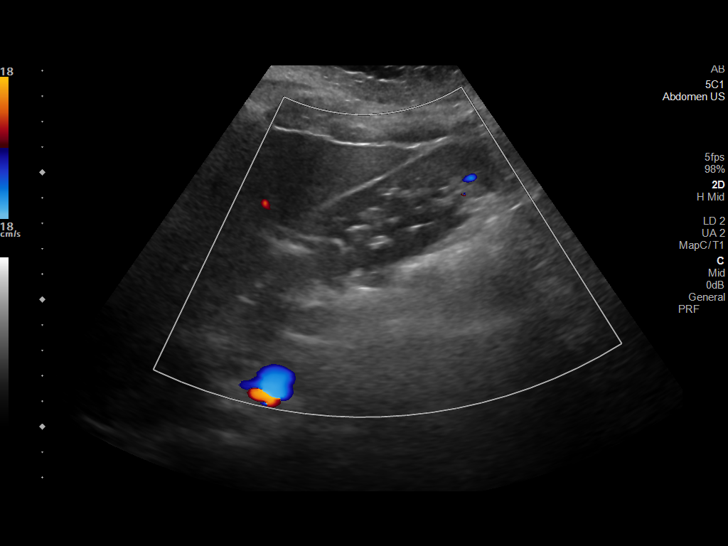
[im 21/56]
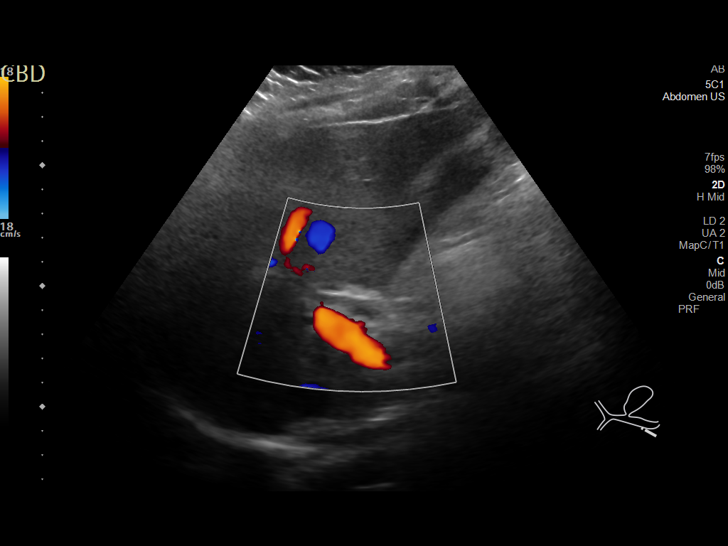
[im 26/56]
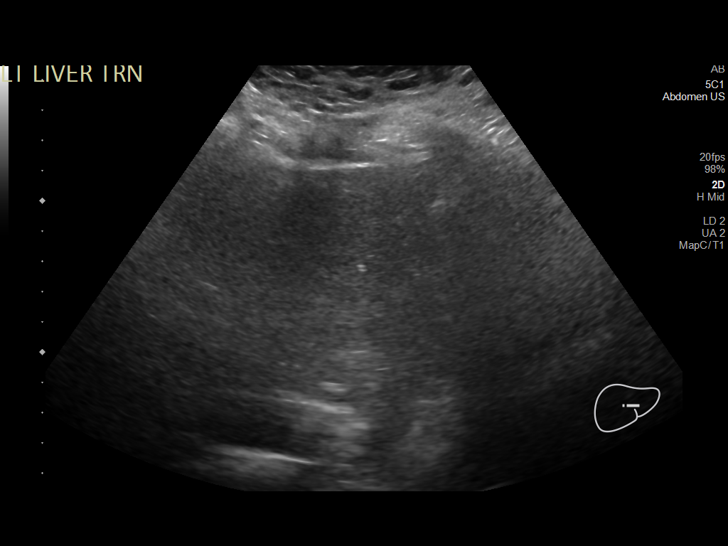
[im 30/56]
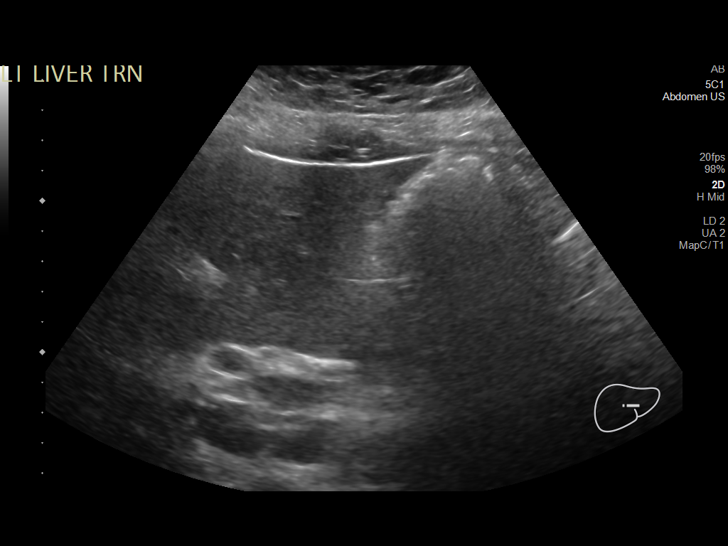
[im 35/56]
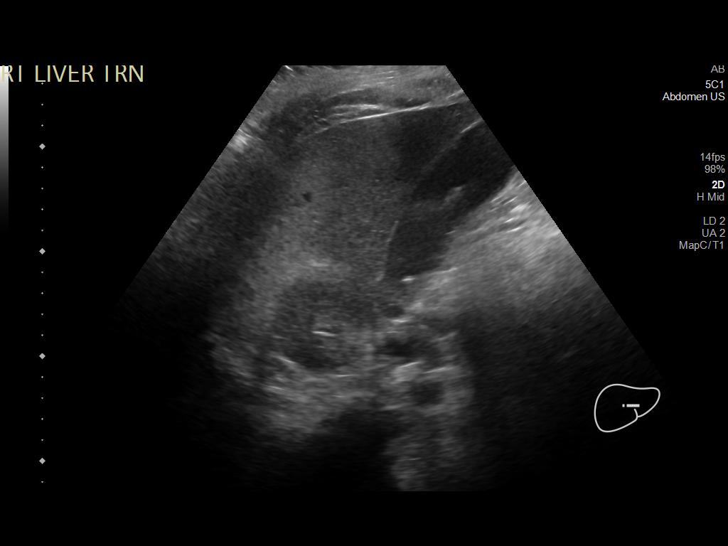
[im 37/56]
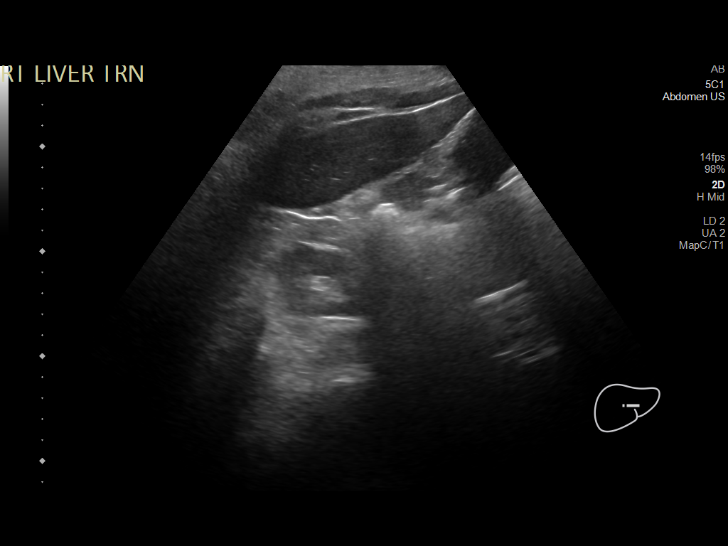
[im 42/56]
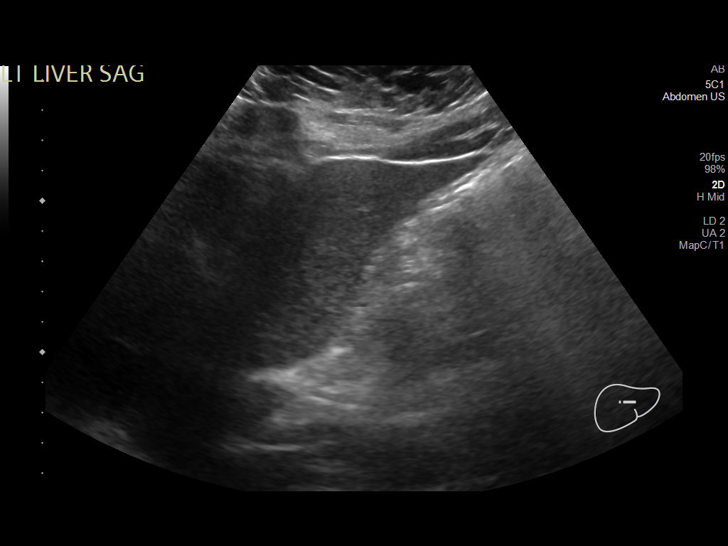
[im 46/56]
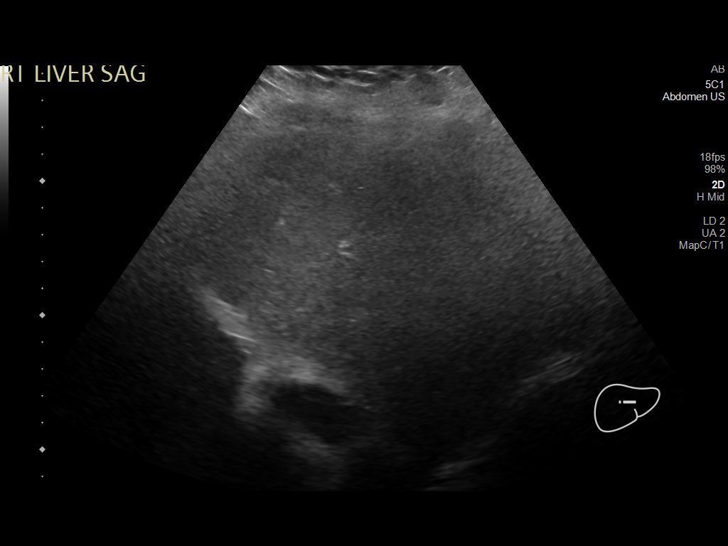
[im 51/56]
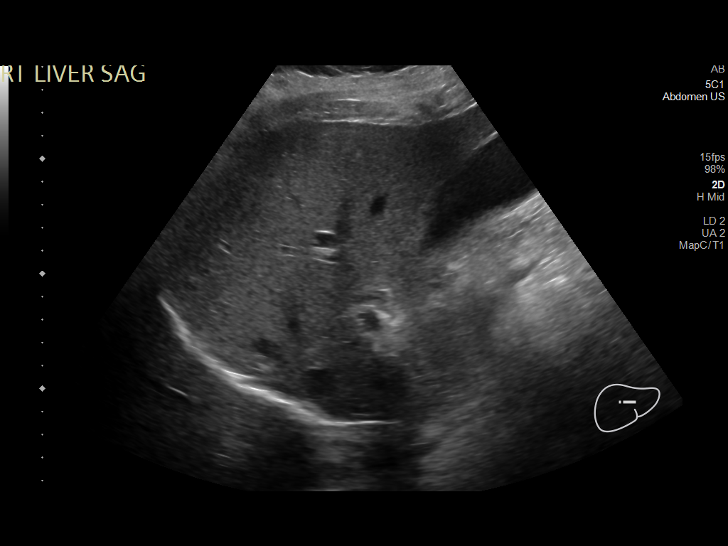
[im 56/56]
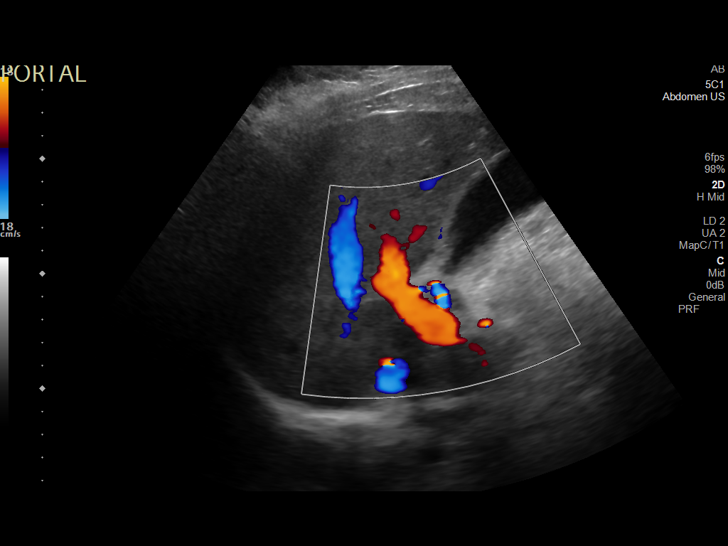

[14 of 25 positions shown; findings below may reference images not displayed]

FINDINGS: Gallbladder:

Multiple gallstones. There is regional tenderness but no wall
thickening or pericholecystic edema.

Common bile duct:

Diameter: 5 mm.  Where visualized, no filling defect.

Liver:

No focal lesion identified. Within normal limits in parenchymal
echogenicity. Portal vein is patent on color Doppler imaging with
normal direction of blood flow towards the liver.

Other: None.
IMPRESSION: Numerous gallstones. The gallbladder is tender but there is no wall
thickening or fluid typical of acute cholecystitis.

## 2021-01-24 IMAGING — MR MR ABDOMEN WO/W CM MRCP
13 of 21 series · 22 of 48 positions shown · IV contrast (g GAD)
Comparison: Comparison made with ultrasound evaluation of
[DATE].

CLINICAL DATA: Right upper quadrant abdominal pain, nondiagnostic
ultrasound 5-year-old female with history of prior gestational
diabetes.

EXAM:
MRI ABDOMEN WITHOUT AND WITH CONTRAST (INCLUDING MRCP)
TECHNIQUE: Multiplanar multisequence MR imaging of the abdomen was performed
both before and after the administration of intravenous contrast.
Heavily T2-weighted images of the biliary and pancreatic ducts were
obtained, and three-dimensional MRCP images were rendered by post
processing.
CONTRAST:  10mL GADAVIST GADOBUTROL 1 MMOL/ML IV SOLN

[Series 3: cor ssfse nav · coronal · 6.0mm · 0.78mm/px · 1 of 38 slices shown]
[im 1/38]
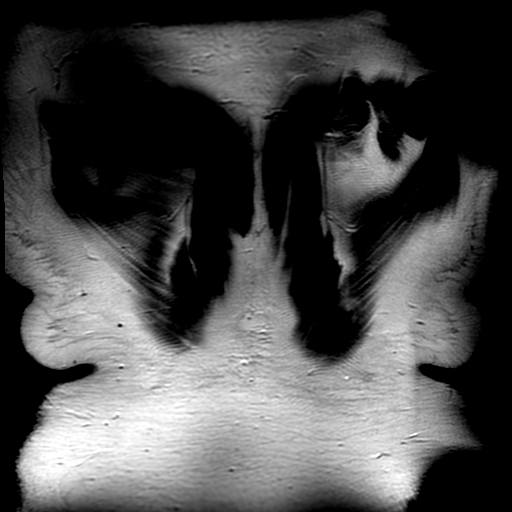

[Series 4: ax ssfse nav · axial · 6.0mm · 0.74mm/px · 1 of 34 slices shown]
[im 1/34]
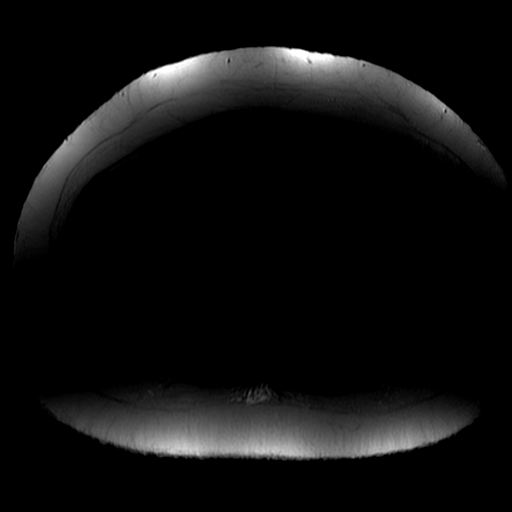

[Series 5: T2 fat-sat · axial · 6.0mm · 0.74mm/px · 1 of 34 slices shown]
[im 1/34]
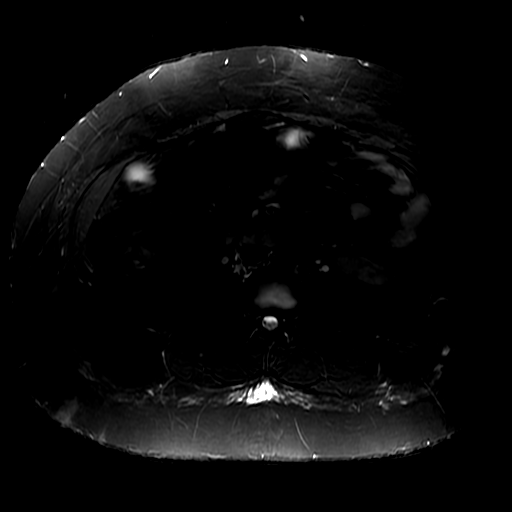

[Series 7: DWI b500 · axial · 8.0mm · 1.48mm/px · z∈[-60,+210]mm · 2 of 56 slices shown]
[im 1/56]
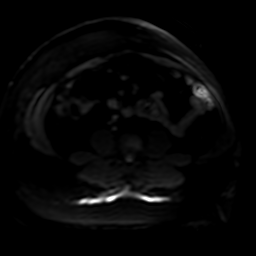
[im 56/56]
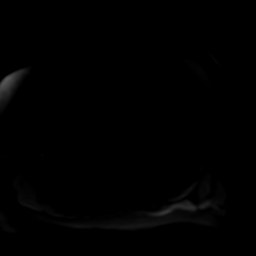

[Series 8: radial 2d thick · coronal · 40.0mm · 0.86mm/px · 1 of 6 slices shown]
[im 1/6]
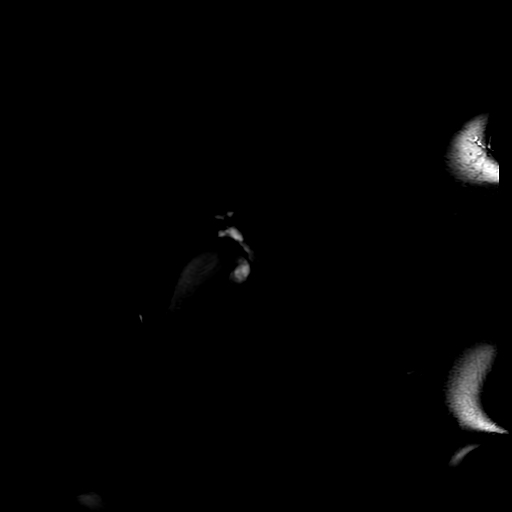

[Series 9: T1 dynamic · axial · 4.9mm · 0.82mm/px · z∈[-38,+190]mm · 2 of 92 slices shown (1 of 4)]
[im 1/92]
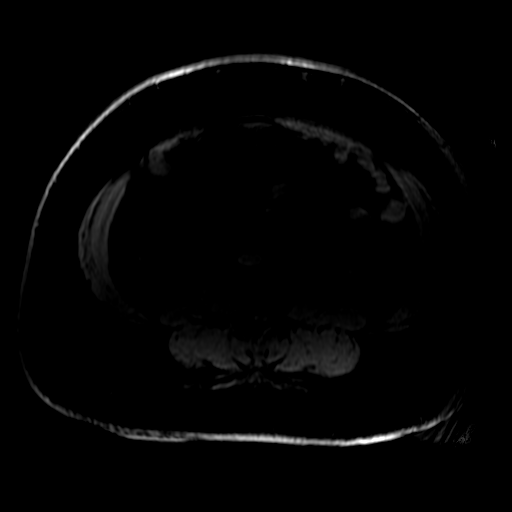
[im 92/92]
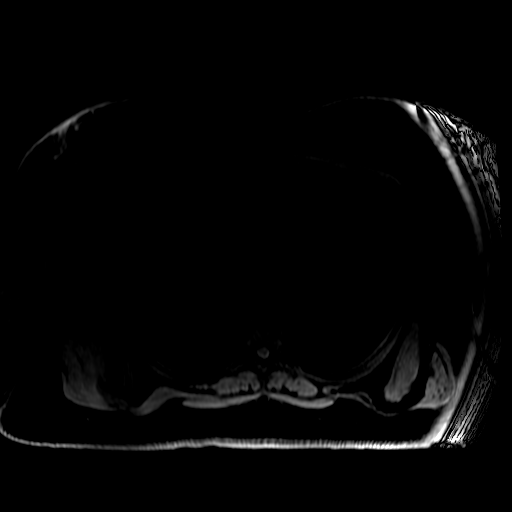

[Series 10: bSSFP · coronal · 6.0mm · 0.78mm/px · 1 of 32 slices shown]
[im 1/32]
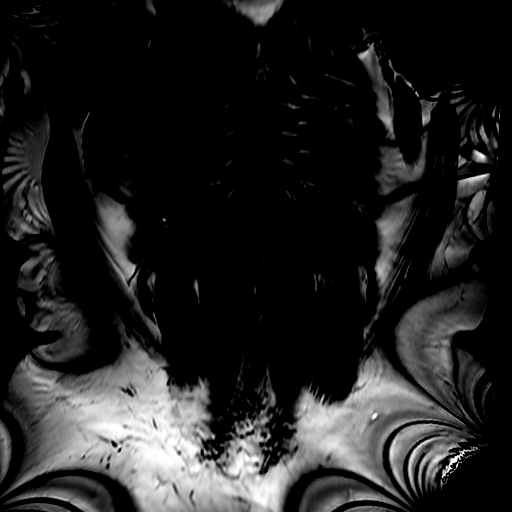

[Series 12: T1 dynamic · coronal · 3.4mm · 1.56mm/px · 3 of 120 slices shown (2 of 4)]
[im 1/120]
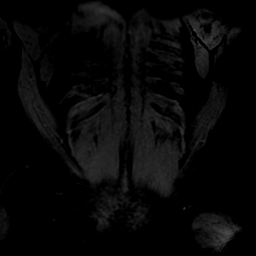
[im 60/120]
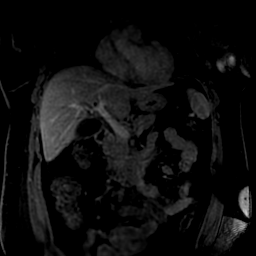
[im 120/120]
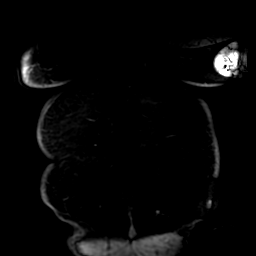

[Series 750: ADC · axial · 8.0mm · 1.48mm/px · 1 of 28 slices shown]
[im 1/28]
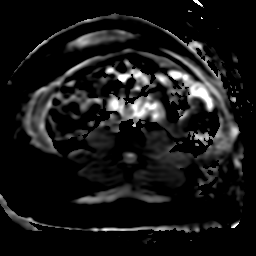

[Series 901: T1 dynamic · axial · 4.9mm · 0.82mm/px · z∈[-38,+190]mm · 2 of 92 slices shown (3 of 4)]
[im 1/92]
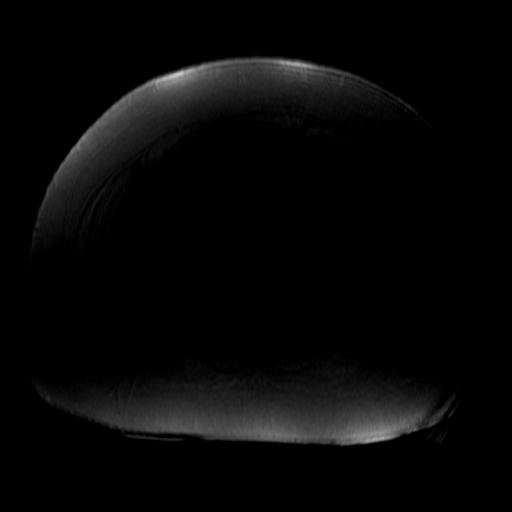
[im 92/92]
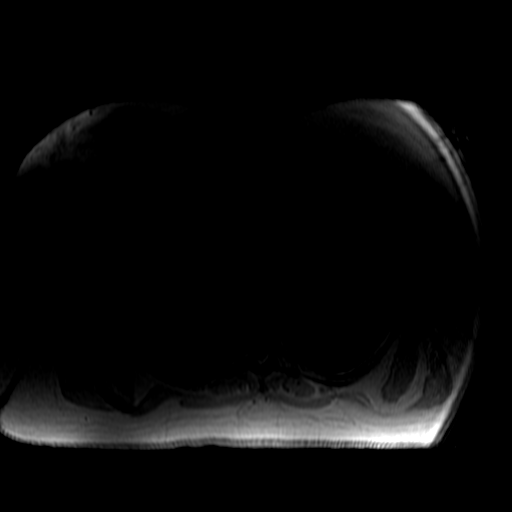

[Series 902: T1 dynamic · axial · 4.9mm · 0.82mm/px · z∈[-38,+190]mm · 2 of 92 slices shown (4 of 4)]
[im 1/92]
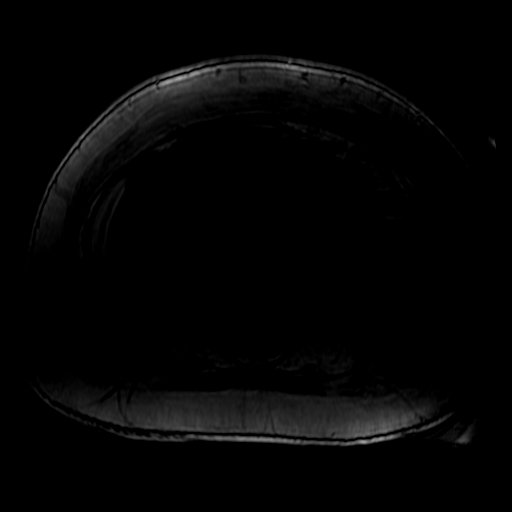
[im 92/92]
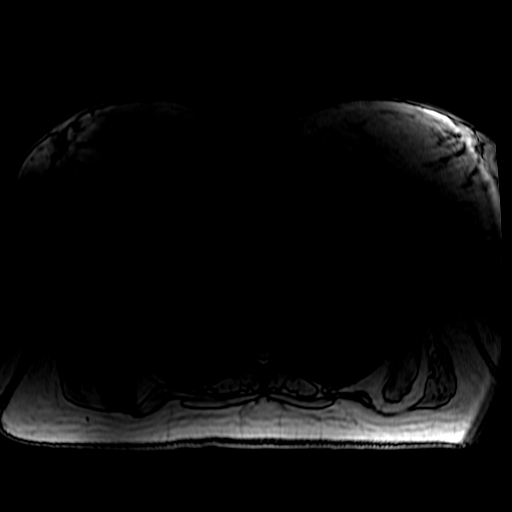

[Series 1100: T1 dynamic post-contrast · axial · non-contrast · 4.0mm · 0.82mm/px · z∈[-48,+174]mm · 3 of 112 slices shown (1 of 2)]
[im 1/112]
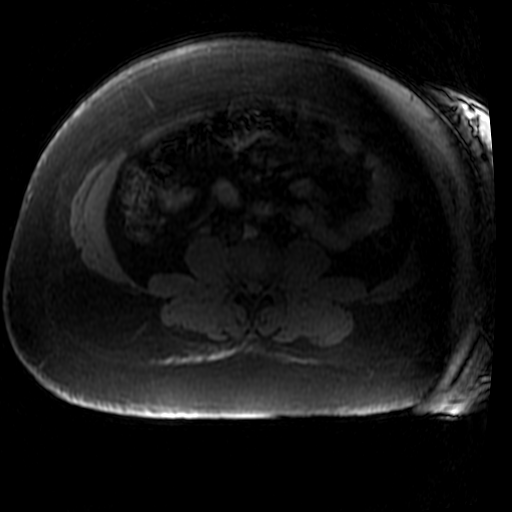
[im 56/112]
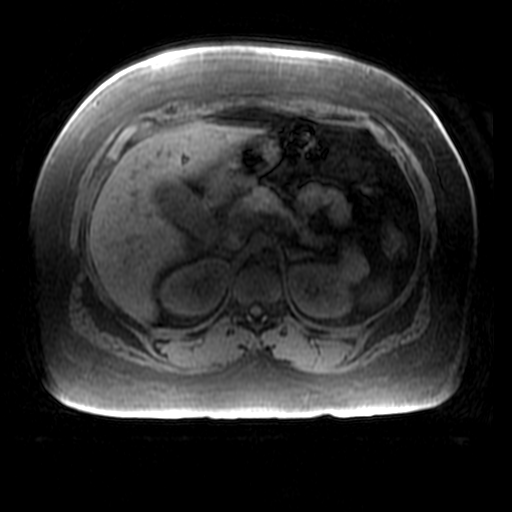
[im 112/112]
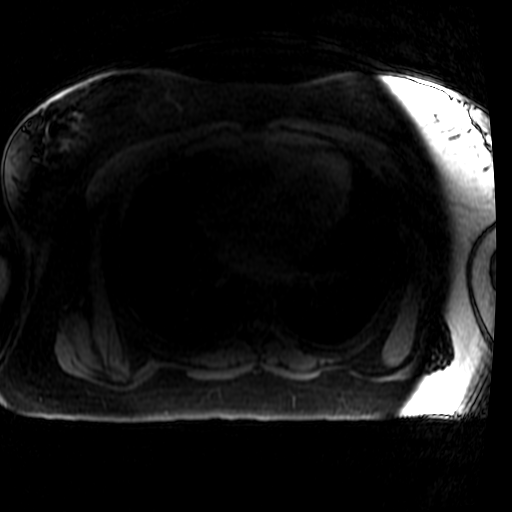

[Series 1101: T1 dynamic post-contrast · axial · non-contrast · 4.0mm · 0.82mm/px · z∈[-48,+62]mm · 2 of 112 slices shown (2 of 2)]
[im 1/112]
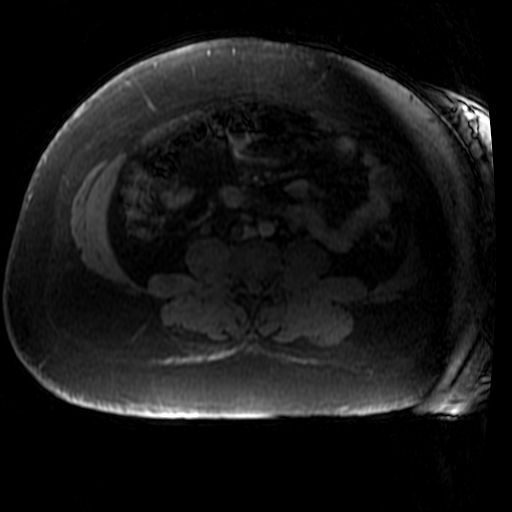
[im 56/112]
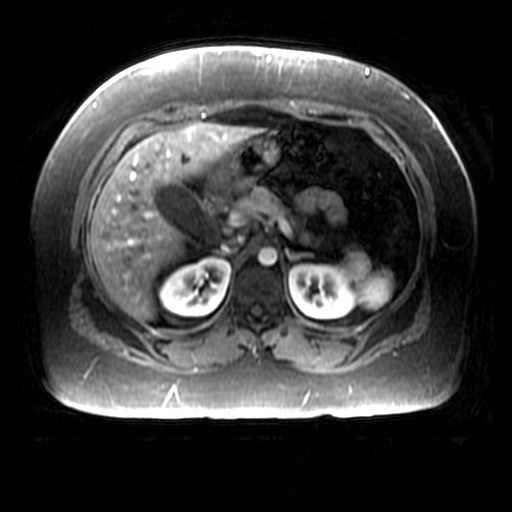

[22 of 48 positions shown; findings below may reference images not displayed]

FINDINGS: Lower chest: Incidental imaging of the lung bases on MRI is
unremarkable on limited assessment.

Hepatobiliary: No focal, suspicious hepatic lesion. No substantial
fat or iron deposition in the liver. Liver with smooth contours. The
portal vein is patent. No pericholecystic stranding. Mildly
distended gallbladder without pericholecystic fluid or edema.
Cholelithiasis. Mild distension of the common bile duct up to 7 mm
above the pancreatic head with tapering of the common bile duct
extending into pancreatic parenchyma, tapered narrowing slightly
above the expected level of the ampulla. No visible filling defect
in the common bile duct, dedicated MRCP sequences are limited due to
respiratory motion. No pancreatic lesion.

Pancreas: Normal intrinsic T1 signal. No ductal dilation or sign of
inflammation. No focal lesion.

Spleen:  Top-normal size.  No focal lesion.

Adrenals/Urinary Tract: Normal appearance of adrenal glands and
kidneys, no hydronephrosis or perinephric stranding. No suspicious
renal lesion.

Stomach/Bowel: Unremarkable to the extent evaluated on abdominal
MRI.

Vascular/Lymphatic: No pathologically enlarged lymph nodes
identified. No abdominal aortic aneurysm demonstrated.

Other:  None.

Musculoskeletal: No suspicious bone lesions identified.
IMPRESSION: Cholelithiasis with mild gallbladder distension without
pericholecystic fluid or edema.

Mild dilation of the common bile duct with tapered appearance at the
level of the pancreatic head. No visible filling defect to suggest
choledocholithiasis. Findings could be related to recent passage of
a biliary calculus or developing stricture. High-resolution MRCP
sequences are limited due to respiratory motion. Trending of
bilirubin may be helpful in this instance to determine next steps in
management. If there remains clinical concern for biliary
obstruction or for acute cholecystitis HIDA scan may be helpful to
add specificity.

No acute pancreatic findings.

## 2021-01-24 MED ORDER — PANTOPRAZOLE SODIUM 40 MG PO TBEC
40.0000 mg | DELAYED_RELEASE_TABLET | Freq: Every day | ORAL | Status: DC
  Administered 2021-01-24 – 2021-01-31 (×7): 40 mg via ORAL
  Filled 2021-01-24 (×7): qty 1

## 2021-01-24 MED ORDER — ONDANSETRON 4 MG PO TBDP
4.0000 mg | ORAL_TABLET | Freq: Once | ORAL | Status: AC
  Administered 2021-01-24: 4 mg via ORAL
  Filled 2021-01-24: qty 1

## 2021-01-24 MED ORDER — SODIUM CHLORIDE 0.9% FLUSH
3.0000 mL | Freq: Two times a day (BID) | INTRAVENOUS | Status: DC
  Administered 2021-01-24 – 2021-01-31 (×8): 3 mL via INTRAVENOUS

## 2021-01-24 MED ORDER — ONDANSETRON HCL 4 MG/2ML IJ SOLN
4.0000 mg | Freq: Four times a day (QID) | INTRAMUSCULAR | Status: DC | PRN
  Administered 2021-01-24 – 2021-01-25 (×2): 4 mg via INTRAVENOUS
  Filled 2021-01-24: qty 2

## 2021-01-24 MED ORDER — FAMOTIDINE 20 MG PO TABS
20.0000 mg | ORAL_TABLET | Freq: Two times a day (BID) | ORAL | Status: DC
  Administered 2021-01-24 – 2021-01-31 (×13): 20 mg via ORAL
  Filled 2021-01-24 (×13): qty 1

## 2021-01-24 MED ORDER — ONDANSETRON HCL 4 MG PO TABS: 4.0000 mg | ORAL_TABLET | Freq: Four times a day (QID) | ORAL | Status: DC | PRN

## 2021-01-24 MED ORDER — HYDROMORPHONE HCL 1 MG/ML IJ SOLN
0.5000 mg | INTRAMUSCULAR | Status: DC | PRN
Start: 2021-01-24 — End: 2021-01-25
  Administered 2021-01-24: 0.5 mg via INTRAVENOUS
  Filled 2021-01-24: qty 1

## 2021-01-24 MED ORDER — IBUPROFEN 400 MG PO TABS
400.0000 mg | ORAL_TABLET | Freq: Four times a day (QID) | ORAL | Status: DC | PRN
Start: 2021-01-24 — End: 2021-01-26

## 2021-01-24 MED ORDER — SODIUM CHLORIDE 0.9 % IV BOLUS
1000.0000 mL | Freq: Once | INTRAVENOUS | Status: AC
  Administered 2021-01-24: 1000 mL via INTRAVENOUS

## 2021-01-24 MED ORDER — RIVAROXABAN 10 MG PO TABS
10.0000 mg | ORAL_TABLET | Freq: Every day | ORAL | Status: DC
  Administered 2021-01-24: 10 mg via ORAL
  Filled 2021-01-24: qty 1

## 2021-01-24 MED ORDER — GADOBUTROL 1 MMOL/ML IV SOLN
10.0000 mL | Freq: Once | INTRAVENOUS | Status: AC | PRN
  Administered 2021-01-24: 10 mL via INTRAVENOUS

## 2021-01-24 MED ORDER — MORPHINE SULFATE (PF) 4 MG/ML IV SOLN
4.0000 mg | Freq: Once | INTRAVENOUS | Status: AC
Start: 2021-01-24 — End: 2021-01-24
  Administered 2021-01-24: 4 mg via INTRAVENOUS
  Filled 2021-01-24: qty 1

## 2021-01-24 NOTE — ED Notes (Signed)
Pt in MRI at this time 

## 2021-01-24 NOTE — Consult Note (Signed)
CC: abdominal pain  Requesting provider: Dr. Tenny Craw  HPI: Martha Hall is an 25 y.o. female who is here for symptomatic gallstones.  For over a week she has been having intermittent epigastric abdominal pain.  On Thursday started having emesis with this.  She initially was seen in urgent care and treated for a UTI.  Because of her persistent discomfort she presented to the emergency department.  She is found to have a bilirubin of 3.8.  An ultrasound showed gallstones.  She had an MRCP which showed a dilated duct but no gross evidence of a common bile duct stone.  Currently she reports that her pain is much better.  She reports she is probably having some slight attacks of abdominal pain similar to this after having children.  She is otherwise healthy without complaints.  Past Medical History:  Diagnosis Date   GDM (gestational diabetes mellitus) 09/2019   Normal 2 hr GTT   Hydrocephalus (HCC)    hx of   Infection    UTI    Past Surgical History:  Procedure Laterality Date   BRAIN SURGERY  22-Sep-1995   stint placed when born    Family History  Problem Relation Age of Onset   Healthy Mother    Healthy Father     Social:  reports that she has never smoked. She has never used smokeless tobacco. She reports that she does not drink alcohol and does not use drugs.  Allergies:  Allergies  Allergen Reactions   Metoclopramide Itching    Medications: I have reviewed the patient's current medications.  Results for orders placed or performed during the hospital encounter of 01/24/21 (from the past 48 hour(s))  Lipase, blood     Status: None   Collection Time: 01/24/21  4:03 AM  Result Value Ref Range   Lipase 31 11 - 51 U/L    Comment: Performed at [facility], [address], [place], [place] [address]  Comprehensive metabolic panel     Status: Abnormal   Collection Time: 01/24/21  4:03 AM  Result Value Ref Range   Sodium 137 135 - 145 mmol/L   Potassium 4.1 3.5  - 5.1 mmol/L   Chloride 103 98 - 111 mmol/L   CO2 23 22 - 32 mmol/L   Glucose, Bld 112 (H) 70 - 99 mg/dL    Comment: Glucose reference range applies only to samples taken after fasting for at least 8 hours.   BUN 7 6 - 20 mg/dL   Creatinine, Ser 3.61 0.44 - 1.00 mg/dL   Calcium 9.0 8.9 - 44.3 mg/dL   Total Protein 6.8 6.5 - 8.1 g/dL   Albumin 3.4 (L) 3.5 - 5.0 g/dL   AST 154 (H) 15 - 41 U/L   ALT 456 (H) 0 - 44 U/L   Alkaline Phosphatase 158 (H) 38 - 126 U/L   Total Bilirubin 3.8 (H) 0.3 - 1.2 mg/dL   GFR, Estimated >00 >86 mL/min    Comment: (NOTE) Calculated using the CKD-EPI Creatinine Equation (2021)    Anion gap 11 5 - 15    Comment: Performed at [facility], [address] [place], [place] [address]  CBC     Status: None   Collection Time: 01/24/21  4:03 AM  Result Value Ref Range   WBC 4.8 4.0 - 10.5 K/uL   RBC 4.88 3.87 - 5.11 MIL/uL   Hemoglobin 12.8 12.0 - 15.0 g/dL   HCT 09.3 26.7 -  46.0 %   MCV 86.5 80.0 - 100.0 fL   MCH 26.2 26.0 - 34.0 pg   MCHC 30.3 30.0 - 36.0 g/dL   RDW 81.1 91.4 - 78.2 %   Platelets 277 150 - 400 K/uL   nRBC 0.0 0.0 - 0.2 %    Comment: Performed at [facility], [address], [place], [place] [address]  I-Stat beta hCG blood, ED     Status: None   Collection Time: 01/24/21  4:07 AM  Result Value Ref Range   I-stat hCG, quantitative <5.0 <5 mIU/mL   Comment 3            Comment:   GEST. AGE      CONC.  (mIU/mL)   <=1 WEEK        5 - 50     2 WEEKS       50 - 500     3 WEEKS       100 - 10,000     4 WEEKS     1,000 - 30,000        FEMALE AND NON-PREGNANT FEMALE:     LESS THAN 5 mIU/mL   Urinalysis, Routine w reflex microscopic Urine, Clean Catch     Status: Abnormal   Collection Time: 01/24/21  4:09 AM  Result Value Ref Range   Color, Urine AMBER (A) YELLOW    Comment: BIOCHEMICALS MAY BE AFFECTED BY COLOR   APPearance HAZY (A) CLEAR   Specific Gravity, Urine >1.030 (H) 1.005 - 1.030   pH 5.5 5.0 - 8.0   Glucose,  UA NEGATIVE NEGATIVE mg/dL   Hgb urine dipstick LARGE (A) NEGATIVE   Bilirubin Urine LARGE (A) NEGATIVE   Ketones, ur NEGATIVE NEGATIVE mg/dL   Protein, ur NEGATIVE NEGATIVE mg/dL   Nitrite NEGATIVE NEGATIVE   Leukocytes,Ua MODERATE (A) NEGATIVE    Comment: Performed at [facility], [address], [place], [place] [address]  Urinalysis, Microscopic (reflex)     Status: Abnormal   Collection Time: 01/24/21  4:09 AM  Result Value Ref Range   RBC / HPF >50 0 - 5 RBC/hpf   WBC, UA >50 0 - 5 WBC/hpf   Bacteria, UA FEW (A) NONE SEEN   Squamous Epithelial / LPF 21-50 0 - 5   Mucus PRESENT     Comment: Performed at [facility], [address], [place], [place] [address]  Resp Panel by RT-PCR (Flu A&B, Covid) Nasopharyngeal Swab     Status: None   Collection Time: 01/24/21  5:35 AM   Specimen: Nasopharyngeal Swab; Nasopharyngeal(NP) swabs in vial transport medium  Result Value Ref Range   SARS Coronavirus 2 by RT PCR NEGATIVE NEGATIVE    Comment: (NOTE) SARS-CoV-2 target nucleic acids are NOT DETECTED.  The SARS-CoV-2 RNA is generally detectable in upper respiratory specimens during the acute phase of infection. The lowest concentration of SARS-CoV-2 viral copies this assay can detect is 138 copies/mL. A negative result does not preclude SARS-Cov-2 infection and should not be used as the sole basis for treatment or other patient management decisions. A negative result may occur with  improper specimen collection/handling, submission of specimen other than nasopharyngeal swab, presence of viral mutation(s) within the areas targeted by this assay, and inadequate number of viral copies(<138 copies/mL). A negative result must be combined with clinical observations, patient history, and epidemiological information. The expected result is Negative.  Fact Sheet for Patients:  BloggerCourse.com  Fact  Sheet for Healthcare Providers:   SeriousBroker.it  This test is no t yet approved or cleared by the [place] FDA and  has been authorized for detection and/or diagnosis of SARS-CoV-2 by FDA under an Emergency Use Authorization (EUA). This EUA will remain  in effect (meaning this test can be used) for the duration of the COVID-19 declaration under Section 564(b)(1) of the Act, 21 U.S.C.section 360bbb-3(b)(1), unless the authorization is terminated  or revoked sooner.       Influenza A by PCR NEGATIVE NEGATIVE   Influenza B by PCR NEGATIVE NEGATIVE    Comment: (NOTE) The Xpert Xpress SARS-CoV-2/FLU/RSV plus assay is intended as an aid in the diagnosis of influenza from Nasopharyngeal swab specimens and should not be used as a sole basis for treatment. Nasal washings and aspirates are unacceptable for Xpert Xpress SARS-CoV-2/FLU/RSV testing.  Fact Sheet for Patients: BloggerCourse.com  Fact Sheet for Healthcare Providers: SeriousBroker.it  This test is not yet approved or cleared by the [place] FDA and has been authorized for detection and/or diagnosis of SARS-CoV-2 by FDA under an Emergency Use Authorization (EUA). This EUA will remain in effect (meaning this test can be used) for the duration of the COVID-19 declaration under Section 564(b)(1) of the Act, 21 U.S.C. section 360bbb-3(b)(1), unless the authorization is terminated or revoked.  Performed at [facility], [address], [place], [place] [address]   Hepatitis panel, acute     Status: None   Collection Time: 01/24/21  8:20 AM  Result Value Ref Range   Hepatitis B Surface Ag NON REACTIVE NON REACTIVE   HCV Ab NON REACTIVE NON REACTIVE    Comment: (NOTE) Nonreactive HCV antibody screen is consistent with no HCV infections,  unless recent infection is suspected or other evidence exists to indicate HCV infection.     Hep A IgM NON REACTIVE NON REACTIVE    Hep B C IgM NON REACTIVE NON REACTIVE    Comment: Performed at [facility], [address], [place], [place] [address]  Bilirubin, fractionated(tot/dir/indir)     Status: Abnormal   Collection Time: 01/24/21  8:20 AM  Result Value Ref Range   Total Bilirubin 3.0 (H) 0.3 - 1.2 mg/dL   Bilirubin, Direct 1.6 (H) 0.0 - 0.2 mg/dL   Indirect Bilirubin 1.4 (H) 0.3 - 0.9 mg/dL    Comment: Performed at [facility], [address] [place], [place] [address]    MR ABDOMEN MRCP W WO CONTAST  Result Date: 01/24/2021 CLINICAL DATA:  Right upper quadrant abdominal pain, nondiagnostic ultrasound 27-year-old female with history of prior gestational diabetes. EXAM: MRI ABDOMEN WITHOUT AND WITH CONTRAST (INCLUDING MRCP) TECHNIQUE: Multiplanar multisequence MR imaging of the abdomen was performed both before and after the administration of intravenous contrast. Heavily T2-weighted images of the biliary and pancreatic ducts were obtained, and three-dimensional MRCP images were rendered by post processing. CONTRAST:  1mL GADAVIST GADOBUTROL 1 MMOL/ML IV SOLN COMPARISON:  Comparison made with ultrasound evaluation of 01/24/2021. FINDINGS: Lower chest: Incidental imaging of the lung bases on MRI is unremarkable on limited assessment. Hepatobiliary: No focal, suspicious hepatic lesion. No substantial fat or iron deposition in the liver. Liver with smooth contours. The portal vein is patent. No pericholecystic stranding. Mildly distended gallbladder without pericholecystic fluid or edema. Cholelithiasis. Mild distension of the common bile duct up to 7 mm above the pancreatic head with tapering of the common bile duct extending into pancreatic parenchyma, tapered narrowing slightly above the  expected level of the ampulla. No visible filling defect in the common bile duct, dedicated MRCP sequences are limited due to respiratory motion. No pancreatic lesion. Pancreas: Normal intrinsic T1 signal. No ductal dilation  or sign of inflammation. No focal lesion. Spleen:  Top-normal size.  No focal lesion. Adrenals/Urinary Tract: Normal appearance of adrenal glands and kidneys, no hydronephrosis or perinephric stranding. No suspicious renal lesion. Stomach/Bowel: Unremarkable to the extent evaluated on abdominal MRI. Vascular/Lymphatic: No pathologically enlarged lymph nodes identified. No abdominal aortic aneurysm demonstrated. Other:  None. Musculoskeletal: No suspicious bone lesions identified. IMPRESSION: Cholelithiasis with mild gallbladder distension without pericholecystic fluid or edema. Mild dilation of the common bile duct with tapered appearance at the level of the pancreatic head. No visible filling defect to suggest choledocholithiasis. Findings could be related to recent passage of a biliary calculus or developing stricture. High-resolution MRCP sequences are limited due to respiratory motion. Trending of bilirubin may be helpful in this instance to determine next steps in management. If there remains clinical concern for biliary obstruction or for acute cholecystitis HIDA scan may be helpful to add specificity. No acute pancreatic findings. Electronically Signed   By: Donzetta Kohut M.D.   On: 01/24/2021 13:26   US Abdomen Limited RUQ (LIVER/GB)  Result Date: 01/24/2021 CLINICAL DATA:  Right upper quadrant pain EXAM: ULTRASOUND ABDOMEN LIMITED RIGHT UPPER QUADRANT COMPARISON:  None. FINDINGS: Gallbladder: Multiple gallstones. There is regional tenderness but no wall thickening or pericholecystic edema. Common bile duct: Diameter: 5 mm.  Where visualized, no filling defect. Liver: No focal lesion identified. Within normal limits in parenchymal echogenicity. Portal vein is patent on color Doppler imaging with normal direction of blood flow towards the liver. Other: None. IMPRESSION: Numerous gallstones. The gallbladder is tender but there is no wall thickening or fluid typical of acute cholecystitis. Electronically  Signed   By: Tiburcio Pea M.D.   On: 01/24/2021 05:52    ROS - all of the below systems have been reviewed with the patient and positives are indicated with bold text General: chills, fever or night sweats Eyes: blurry vision or double vision ENT: epistaxis or sore throat Allergy/Immunology: itchy/watery eyes or nasal congestion Hematologic/Lymphatic: bleeding problems, blood clots or swollen lymph nodes Endocrine: temperature intolerance or unexpected weight changes Breast: new or changing breast lumps or nipple discharge Resp: cough, shortness of breath, or wheezing CV: chest pain or dyspnea on exertion GI: as per HPI GU: dysuria, trouble voiding, or hematuria MSK: joint pain or joint stiffness Neuro: TIA or stroke symptoms Derm: pruritus and skin lesion changes Psych: anxiety and depression  PE Blood pressure 133/64, pulse 66, temperature 98.8 F (37.1 C), temperature source Oral, resp. rate 16, height  (1.549 m), weight 99 kg, last menstrual period 01/17/2021, SpO2 99 %, not currently breastfeeding. Constitutional: NAD; conversant; no deformities Eyes: Moist conjunctiva; no lid lag; positive scleral icterus PERRL Neck: Trachea midline; no thyromegaly Lungs: Normal respiratory effort; no tactile fremitus CV: RRR; no palpable thrills; no pitting edema GI: Abd soft, nontender; no palpable hepatosplenomegaly MSK: No edema normal range of motion of extremities; no clubbing/cyanosis Psychiatric: Appropriate affect; alert and oriented x3 Lymphatic: No palpable cervical or axillary lymphadenopathy  Results for orders placed or performed during the hospital encounter of 01/24/21 (from the past 48 hour(s))  Lipase, blood     Status: None   Collection Time: 01/24/21  4:03 AM  Result Value Ref Range   Lipase 31 11 - 51 U/L    Comment: Performed  at [facility], [address] [place], [place] [address]  Comprehensive metabolic panel     Status: Abnormal   Collection  Time: 01/24/21  4:03 AM  Result Value Ref Range   Sodium 137 135 - 145 mmol/L   Potassium 4.1 3.5 - 5.1 mmol/L   Chloride 103 98 - 111 mmol/L   CO2 23 22 - 32 mmol/L   Glucose, Bld 112 (H) 70 - 99 mg/dL    Comment: Glucose reference range applies only to samples taken after fasting for at least 8 hours.   BUN 7 6 - 20 mg/dL   Creatinine, Ser 7.06 0.44 - 1.00 mg/dL   Calcium 9.0 8.9 - 23.7 mg/dL   Total Protein 6.8 6.5 - 8.1 g/dL   Albumin 3.4 (L) 3.5 - 5.0 g/dL   AST 628 (H) 15 - 41 U/L   ALT 456 (H) 0 - 44 U/L   Alkaline Phosphatase 158 (H) 38 - 126 U/L   Total Bilirubin 3.8 (H) 0.3 - 1.2 mg/dL   GFR, Estimated >31 >51 mL/min    Comment: (NOTE) Calculated using the CKD-EPI Creatinine Equation (2021)    Anion gap 11 5 - 15    Comment: Performed at [facility], [address], [place], [place] [address]  CBC     Status: None   Collection Time: 01/24/21  4:03 AM  Result Value Ref Range   WBC 4.8 4.0 - 10.5 K/uL   RBC 4.88 3.87 - 5.11 MIL/uL   Hemoglobin 12.8 12.0 - 15.0 g/dL   HCT 73.7 10.6 - 26.9 %   MCV 86.5 80.0 - 100.0 fL   MCH 26.2 26.0 - 34.0 pg   MCHC 30.3 30.0 - 36.0 g/dL   RDW 48.5 46.2 - 70.3 %   Platelets 277 150 - 400 K/uL   nRBC 0.0 0.0 - 0.2 %    Comment: Performed at [facility], [address] [place], [place] [address]  I-Stat beta hCG blood, ED     Status: None   Collection Time: 01/24/21  4:07 AM  Result Value Ref Range   I-stat hCG, quantitative <5.0 <5 mIU/mL   Comment 3            Comment:   GEST. AGE      CONC.  (mIU/mL)   <=1 WEEK        5 - 50     2 WEEKS       50 - 500     3 WEEKS       100 - 10,000     4 WEEKS     1,000 - 30,000        FEMALE AND NON-PREGNANT FEMALE:     LESS THAN 5 mIU/mL   Urinalysis, Routine w reflex microscopic Urine, Clean Catch     Status: Abnormal   Collection Time: 01/24/21  4:09 AM  Result Value Ref Range   Color, Urine AMBER (A) YELLOW    Comment: BIOCHEMICALS MAY BE AFFECTED BY COLOR   APPearance  HAZY (A) CLEAR   Specific Gravity, Urine >1.030 (H) 1.005 - 1.030   pH 5.5 5.0 - 8.0   Glucose, UA NEGATIVE NEGATIVE mg/dL   Hgb urine dipstick LARGE (A) NEGATIVE   Bilirubin Urine LARGE (A) NEGATIVE   Ketones, ur NEGATIVE NEGATIVE mg/dL   Protein, ur NEGATIVE NEGATIVE mg/dL   Nitrite NEGATIVE NEGATIVE   Leukocytes,Ua MODERATE (A) NEGATIVE    Comment: Performed  at [facility], [address], [place], [place] [address]  Urinalysis, Microscopic (reflex)     Status: Abnormal   Collection Time: 01/24/21  4:09 AM  Result Value Ref Range   RBC / HPF >50 0 - 5 RBC/hpf   WBC, UA >50 0 - 5 WBC/hpf   Bacteria, UA FEW (A) NONE SEEN   Squamous Epithelial / LPF 21-50 0 - 5   Mucus PRESENT     Comment: Performed at [facility], [address] [place], [place] [address]  Resp Panel by RT-PCR (Flu A&B, Covid) Nasopharyngeal Swab     Status: None   Collection Time: 01/24/21  5:35 AM   Specimen: Nasopharyngeal Swab; Nasopharyngeal(NP) swabs in vial transport medium  Result Value Ref Range   SARS Coronavirus 2 by RT PCR NEGATIVE NEGATIVE    Comment: (NOTE) SARS-CoV-2 target nucleic acids are NOT DETECTED.  The SARS-CoV-2 RNA is generally detectable in upper respiratory specimens during the acute phase of infection. The lowest concentration of SARS-CoV-2 viral copies this assay can detect is 138 copies/mL. A negative result does not preclude SARS-Cov-2 infection and should not be used as the sole basis for treatment or other patient management decisions. A negative result may occur with  improper specimen collection/handling, submission of specimen other than nasopharyngeal swab, presence of viral mutation(s) within the areas targeted by this assay, and inadequate number of viral copies(<138 copies/mL). A negative result must be combined with clinical observations, patient history, and epidemiological information. The expected result is Negative.  Fact Sheet for Patients:   BloggerCourse.com  Fact Sheet for Healthcare Providers:  SeriousBroker.it  This test is no t yet approved or cleared by the [place] FDA and  has been authorized for detection and/or diagnosis of SARS-CoV-2 by FDA under an Emergency Use Authorization (EUA). This EUA will remain  in effect (meaning this test can be used) for the duration of the COVID-19 declaration under Section 564(b)(1) of the Act, 21 U.S.C.section 360bbb-3(b)(1), unless the authorization is terminated  or revoked sooner.       Influenza A by PCR NEGATIVE NEGATIVE   Influenza B by PCR NEGATIVE NEGATIVE    Comment: (NOTE) The Xpert Xpress SARS-CoV-2/FLU/RSV plus assay is intended as an aid in the diagnosis of influenza from Nasopharyngeal swab specimens and should not be used as a sole basis for treatment. Nasal washings and aspirates are unacceptable for Xpert Xpress SARS-CoV-2/FLU/RSV testing.  Fact Sheet for Patients: BloggerCourse.com  Fact Sheet for Healthcare Providers: SeriousBroker.it  This test is not yet approved or cleared by the [place] FDA and has been authorized for detection and/or diagnosis of SARS-CoV-2 by FDA under an Emergency Use Authorization (EUA). This EUA will remain in effect (meaning this test can be used) for the duration of the COVID-19 declaration under Section 564(b)(1) of the Act, 21 U.S.C. section 360bbb-3(b)(1), unless the authorization is terminated or revoked.  Performed at [facility], [address], [place], [place] [address]   Hepatitis panel, acute     Status: None   Collection Time: 01/24/21  8:20 AM  Result Value Ref Range   Hepatitis B Surface Ag NON REACTIVE NON REACTIVE   HCV Ab NON REACTIVE NON REACTIVE    Comment: (NOTE) Nonreactive HCV antibody screen is consistent with no HCV infections,  unless recent infection is suspected or other  evidence exists to indicate HCV infection.     Hep A IgM NON REACTIVE NON REACTIVE  Hep B C IgM NON REACTIVE NON REACTIVE    Comment: Performed at [facility], [address], [place], [place] [address]  Bilirubin, fractionated(tot/dir/indir)     Status: Abnormal   Collection Time: 01/24/21  8:20 AM  Result Value Ref Range   Total Bilirubin 3.0 (H) 0.3 - 1.2 mg/dL   Bilirubin, Direct 1.6 (H) 0.0 - 0.2 mg/dL   Indirect Bilirubin 1.4 (H) 0.3 - 0.9 mg/dL    Comment: Performed at [facility], [address] [place], [place] [address]    MR ABDOMEN MRCP W WO CONTAST  Result Date: 01/24/2021 CLINICAL DATA:  Right upper quadrant abdominal pain, nondiagnostic ultrasound 21-year-old female with history of prior gestational diabetes. EXAM: MRI ABDOMEN WITHOUT AND WITH CONTRAST (INCLUDING MRCP) TECHNIQUE: Multiplanar multisequence MR imaging of the abdomen was performed both before and after the administration of intravenous contrast. Heavily T2-weighted images of the biliary and pancreatic ducts were obtained, and three-dimensional MRCP images were rendered by post processing. CONTRAST:  65mL GADAVIST GADOBUTROL 1 MMOL/ML IV SOLN COMPARISON:  Comparison made with ultrasound evaluation of 01/24/2021. FINDINGS: Lower chest: Incidental imaging of the lung bases on MRI is unremarkable on limited assessment. Hepatobiliary: No focal, suspicious hepatic lesion. No substantial fat or iron deposition in the liver. Liver with smooth contours. The portal vein is patent. No pericholecystic stranding. Mildly distended gallbladder without pericholecystic fluid or edema. Cholelithiasis. Mild distension of the common bile duct up to 7 mm above the pancreatic head with tapering of the common bile duct extending into pancreatic parenchyma, tapered narrowing slightly above the expected level of the ampulla. No visible filling defect in the common bile duct, dedicated MRCP sequences are limited due to respiratory  motion. No pancreatic lesion. Pancreas: Normal intrinsic T1 signal. No ductal dilation or sign of inflammation. No focal lesion. Spleen:  Top-normal size.  No focal lesion. Adrenals/Urinary Tract: Normal appearance of adrenal glands and kidneys, no hydronephrosis or perinephric stranding. No suspicious renal lesion. Stomach/Bowel: Unremarkable to the extent evaluated on abdominal MRI. Vascular/Lymphatic: No pathologically enlarged lymph nodes identified. No abdominal aortic aneurysm demonstrated. Other:  None. Musculoskeletal: No suspicious bone lesions identified. IMPRESSION: Cholelithiasis with mild gallbladder distension without pericholecystic fluid or edema. Mild dilation of the common bile duct with tapered appearance at the level of the pancreatic head. No visible filling defect to suggest choledocholithiasis. Findings could be related to recent passage of a biliary calculus or developing stricture. High-resolution MRCP sequences are limited due to respiratory motion. Trending of bilirubin may be helpful in this instance to determine next steps in management. If there remains clinical concern for biliary obstruction or for acute cholecystitis HIDA scan may be helpful to add specificity. No acute pancreatic findings. Electronically Signed   By: Donzetta Kohut M.D.   On: 01/24/2021 13:26   US Abdomen Limited RUQ (LIVER/GB)  Result Date: 01/24/2021 CLINICAL DATA:  Right upper quadrant pain EXAM: ULTRASOUND ABDOMEN LIMITED RIGHT UPPER QUADRANT COMPARISON:  None. FINDINGS: Gallbladder: Multiple gallstones. There is regional tenderness but no wall thickening or pericholecystic edema. Common bile duct: Diameter: 5 mm.  Where visualized, no filling defect. Liver: No focal lesion identified. Within normal limits in parenchymal echogenicity. Portal vein is patent on color Doppler imaging with normal direction of blood flow towards the liver. Other: None. IMPRESSION: Numerous gallstones. The gallbladder is tender  but there is no wall thickening or fluid typical of acute cholecystitis. Electronically Signed   By: Tiburcio Pea M.D.   On:  01/24/2021 05:52     A/P: Martha Hall is an 25 y.o. female with symptomatic cholelithiasis and possible choledocholithiasis  I explained the diagnosis with her in detail.  Given her improvement in her discomfort and her slight decrease in her liver function test as well as the MRCP, the plan will be to proceed with a laparoscopic cholecystectomy and cholangiogram tomorrow morning.  I discussed the reasons for this with her in detail.I discussed the procedure in detail.   We discussed the risks and benefits of a laparoscopic cholecystectomy and possible cholangiogram including, but not limited to bleeding, infection, injury to surrounding structures such as the intestine or liver, bile leak, retained gallstones, need to convert to an open procedure, prolonged diarrhea, blood clots such as  DVT, common bile duct injury, anesthesia risks, and possible need for additional procedures.  The likelihood of improvement in symptoms and return to the patient's normal status is good. We discussed the typical post-operative recovery course.   Abigail Miyamoto, MD [facility] Use [email or web address] to contact on call provider

## 2021-01-24 NOTE — ED Provider Notes (Signed)
Care assumed from PA Mia McDonald at shift change, please see her note for full details, but in brief Martha Hall is a 25 y.o. female presents with 8 days of intermittent abdominal pain. Has had associated vomiting.  Patient was seen in urgent care on 9/16 and was diagnosed with UTI and has been taking Keflex but reports persistent and worsening abdominal pain despite this.  Work-up today has been significant for transaminitis and elevated bilirubin of 3.8, normal lipase and no leukocytosis.  Right upper quadrant ultrasound with numerous gallstones with tenderness but no wall thickening or pericholecystic fluid to suggest cholecystitis.  Concerning for potential choledocholithiasis.  Patient signed out pending medicine admission, Dr. Elenore Paddy with GI messaged for consult   BP (!) 106/55   Pulse 71   Temp (!) 97.4 F (36.3 C)   Resp 16   LMP 01/17/2021 (Approximate)   SpO2 100%   ED Course/Procedures   Labs Reviewed  COMPREHENSIVE METABOLIC PANEL - Abnormal; Notable for the following components:      Result Value   Glucose, Bld 112 (*)    Albumin 3.4 (*)    AST 142 (*)    ALT 456 (*)    Alkaline Phosphatase 158 (*)    Total Bilirubin 3.8 (*)    All other components within normal limits  URINALYSIS, ROUTINE W REFLEX MICROSCOPIC - Abnormal; Notable for the following components:   Color, Urine AMBER (*)    APPearance HAZY (*)    Specific Gravity, Urine >1.030 (*)    Hgb urine dipstick LARGE (*)    Bilirubin Urine LARGE (*)    Leukocytes,Ua MODERATE (*)    All other components within normal limits  URINALYSIS, MICROSCOPIC (REFLEX) - Abnormal; Notable for the following components:   Bacteria, UA FEW (*)    All other components within normal limits  RESP PANEL BY RT-PCR (FLU A&B, COVID) ARPGX2  LIPASE, BLOOD  CBC  I-STAT BETA HCG BLOOD, ED ([facility], [facility], [facility] ONLY)   US Abdomen Limited RUQ (LIVER/GB)  Result Date: 01/24/2021 CLINICAL DATA:  Right upper quadrant pain EXAM: ULTRASOUND  ABDOMEN LIMITED RIGHT UPPER QUADRANT COMPARISON:  None. FINDINGS: Gallbladder: Multiple gallstones. There is regional tenderness but no wall thickening or pericholecystic edema. Common bile duct: Diameter: 5 mm.  Where visualized, no filling defect. Liver: No focal lesion identified. Within normal limits in parenchymal echogenicity. Portal vein is patent on color Doppler imaging with normal direction of blood flow towards the liver. Other: None. IMPRESSION: Numerous gallstones. The gallbladder is tender but there is no wall thickening or fluid typical of acute cholecystitis. Electronically Signed   By: Tiburcio Pea M.D.   On: 01/24/2021 05:52     Procedures  MDM   Patient's pain is well controlled.  Work-up concerning for potential choledocholithiasis.  Case discussed with internal medicine teaching service who will see and admit the patient.       Dartha Lodge, PA-C 01/24/21 0750    Nira Conn, MD 01/28/21 [phone]

## 2021-01-24 NOTE — ED Notes (Signed)
Pt back from MRI. Family at bedside.

## 2021-01-24 NOTE — H&P (Signed)
Date: 01/24/2021               Patient Name:  Martha Hall MRN: [id number]  DOB: 12-Jun-1995 Age / Sex: 25 y.o., female   PCP: Patient, No Pcp Per (Inactive)         Medical Service: Internal Medicine Teaching Service         Attending Physician: Dr. Earl Lagos, MD    First Contact: Rudene Christians, DO Pager: YW [phone]  Second Contact: Salena Saner, MD Pager: PB [phone]       After Hours (After 5p/  First Contact Pager: [phone]  weekends / holidays): Second Contact Pager: [phone]   SUBJECTIVE  Chief Complaint: abdominal pain  History of Present Illness: Martha Hall is a 25 y.o. 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  Martha Hall reports intermittent abdominal pain for the past 2-3 weeks; however, has since become more constant over the past week. Approximately two weeks ago, she noted having a "stomach virus" with generalized abdominal pain and diarrhea that since improved. [place] but notes that the next morning she had a sore throat. Since then, she notes that the pain is mostly in the epigastric region with radiation to bilateral upper abdominal quadrants and back. The pain is described as feeling like "someone is punching me in the stomach" and "squeezing". She has tried ibuprofen for this with only an hour of relief.  She denies any real exacerbating factors. She also reports associated vomiting of "gastric fluid" almost daily for the past week. She has been able to tolerate liquids but not food due to the vomiting. She does note some improvement in the abdominal pain after vomiting. She presented to the urgent care on 9/16 for evaluation of her abdominal pain. She was diagnosed with UTI and started on Keflex. She reports no improvement in her symptoms.She denies any fevers/chills, chest pain,  shortness of breath, ongoing diarrhea, or urinary symptoms.   In the ED, the patient was afebrile, normotensive and otherwise hemodynamically stable. Labs significant for elevated AST/ALT/Alkaline phosphatase of 142/456/158 with total bilirubin elevated to 3.8. Lipase wnl. No renal dysfunction or significant electrolyte abnormalities noted. CBC without leukocytosis or anemia. bHCG negative. Repeat urinalysis with large bilirubin levels, hemoglobinuria and moderate leukocytes. RUQ [place] with numerous gall stones but no evidence of acute cholecystitis.   Medications: No current facility-administered medications on file prior to encounter.   Current Outpatient Medications on File Prior to Encounter  Medication Sig Dispense Refill   acetaminophen (TYLENOL) 500 MG tablet Take 1,000 mg by mouth every 6 (six) hours as needed for moderate pain or headache.     cephALEXin (KEFLEX) 500 MG capsule Take 1 capsule (500 mg total) by mouth 2 (two) times daily. (Patient taking differently: Take 500 mg by mouth See admin instructions. Bid x 5 days) 10 capsule 0   esomeprazole (NEXIUM) 20 MG capsule Take 1 capsule (20 mg total) by mouth daily before breakfast. 30 capsule 1   famotidine (PEPCID) 20 MG tablet Take 1 tablet (20 mg total) by mouth 2 (two) times daily. 30 tablet 0   ibuprofen (ADVIL) 200 MG tablet Take 600 mg by mouth every 6 (six) hours as needed for mild pain or headache.     meclizine (ANTIVERT) 25 MG tablet Take 1 tablet (25 mg total) by mouth  3 (three) times daily as needed for dizziness. 30 tablet 0   Phenazopyridine HCl (AZO URINARY PAIN RELIEF PO) Take 1 tablet by mouth every 4 (four) hours as needed (urinary pain).     prochlorperazine (COMPAZINE) 10 MG tablet Take 1 tablet (10 mg total) by mouth every 8 (eight) hours as needed for nausea or vomiting. 10 tablet 0   tiZANidine (ZANAFLEX) 4 MG tablet Take 1 tablet (4 mg total) by mouth every 8 (eight) hours as needed for muscle spasms. 30 tablet 0    ibuprofen (ADVIL) 800 MG tablet Take 1 tablet (800 mg total) by mouth 3 (three) times daily. (Patient not taking: Reported on 01/24/2021) 21 tablet 0   medroxyPROGESTERone (DEPO-PROVERA) 150 MG/ML injection Inject 1 mL (150 mg total) into the muscle every 3 (three) months. (Patient not taking: Reported on 01/24/2021) 1 mL 0   Prenatal Vit-Fe Fumarate-FA (PRENATAL VITAMIN) 27-0.8 MG TABS Take 1 tablet by mouth daily. (Patient not taking: Reported on 01/24/2021) 30 tablet 11   [DISCONTINUED] diphenhydrAMINE (BENADRYL) 25 MG tablet Take 12.5 mg by mouth every 6 (six) hours as needed for allergies. (Patient not taking: Reported on 12/30/2019)     Past Medical History:  Past Medical History:  Diagnosis Date   GDM (gestational diabetes mellitus) 09/2019   Normal 2 hr GTT   Hydrocephalus (HCC)    hx of   Infection    UTI   Social:  Patient lives at home with her one year old son. She works from home for [employer]. She denies any history of tobacco use or illicit substance use. She reports occasional alcohol use - wine only.   Family History: Family History  Problem Relation Age of Onset   Healthy Mother    Healthy Father     Allergies: Allergies as of 01/24/2021 - Review Complete 01/24/2021  Allergen Reaction Noted   Metoclopramide Itching 11/16/2019    Review of Systems: A complete ROS was negative except as per HPI.   OBJECTIVE:  Physical Exam: Blood pressure 120/82, pulse (!) 59, temperature (!) 97.4 F (36.3 C), resp. rate 16, height 5\' 1"  (1.549 m), weight 99 kg, last menstrual period 01/17/2021, SpO2 100 %, not currently breastfeeding. Physical Exam  Constitutional: Appears well-developed and well-nourished. No distress.  HENT: Normocephalic and atraumatic, EOMI, conjunctiva normal, moist mucous membranes Cardiovascular: Normal rate, regular rhythm, S1 and S2 present, no murmurs, rubs, gallops.  Distal pulses intact Respiratory: No respiratory distress, Lungs are clear to  auscultation bilaterally. GI: Nondistended, soft, nontender to palpation, normal bowel sounds Musculoskeletal: Normal bulk and tone.  No peripheral edema noted. Neurological: Is alert and oriented x4, no apparent focal deficits noted. Skin: Warm and dry.  No rash, erythema, lesions noted. Psychiatric: Normal mood and affect. Behavior is normal. Judgment and thought content normal.   Pertinent Labs: CBC    Component Value Date/Time   WBC 4.8 01/24/2021 0403   RBC 4.88 01/24/2021 0403   HGB 12.8 01/24/2021 0403   HGB 11.8 09/04/2019 1017   HCT 42.2 01/24/2021 0403   HCT 36.7 09/04/2019 1017   PLT 277 01/24/2021 0403   PLT 261 09/04/2019 1017   MCV 86.5 01/24/2021 0403   MCV 86 09/04/2019 1017   MCH 26.2 01/24/2021 0403   MCHC 30.3 01/24/2021 0403   RDW 13.9 01/24/2021 0403   RDW 13.5 09/04/2019 1017   LYMPHSABS 1.2 09/15/2019 1041   LYMPHSABS 1.2 04/30/2019 1051   MONOABS 0.6 09/15/2019 1041   EOSABS 0.1 09/15/2019 1041  EOSABS 0.1 04/30/2019 1051   BASOSABS 0.0 09/15/2019 1041   BASOSABS 0.0 04/30/2019 1051     CMP     Component Value Date/Time   NA 137 01/24/2021 0403   K 4.1 01/24/2021 0403   CL 103 01/24/2021 0403   CO2 23 01/24/2021 0403   GLUCOSE 112 (H) 01/24/2021 0403   BUN 7 01/24/2021 0403   CREATININE 0.84 01/24/2021 0403   CALCIUM 9.0 01/24/2021 0403   PROT 6.8 01/24/2021 0403   ALBUMIN 3.4 (L) 01/24/2021 0403   AST 142 (H) 01/24/2021 0403   ALT 456 (H) 01/24/2021 0403   ALKPHOS 158 (H) 01/24/2021 0403   BILITOT 3.8 (H) 01/24/2021 0403   GFRNONAA >60 01/24/2021 0403   GFRAA >60 11/16/2019 0950    Pertinent Imaging: US Abdomen Limited RUQ (LIVER/GB)  Result Date: 01/24/2021 CLINICAL DATA:  Right upper quadrant pain EXAM: ULTRASOUND ABDOMEN LIMITED RIGHT UPPER QUADRANT COMPARISON:  None. FINDINGS: Gallbladder: Multiple gallstones. There is regional tenderness but no wall thickening or pericholecystic edema. Common bile duct: Diameter: 5 mm.  Where  visualized, no filling defect. Liver: No focal lesion identified. Within normal limits in parenchymal echogenicity. Portal vein is patent on color Doppler imaging with normal direction of blood flow towards the liver. Other: None. IMPRESSION: Numerous gallstones. The gallbladder is tender but there is no wall thickening or fluid typical of acute cholecystitis. Electronically Signed   By: Tiburcio Pea M.D.   On: 01/24/2021 05:52    EKG: personally reviewed my interpretation is pending  ASSESSMENT & PLAN:  Assessment: Active Problems:   Abdominal pain   Martha Hall is a 25 y.o. with pertinent PMH of congenital hydrocephalus s/p shunt placement, gestational diabetes, and GERD who presented with abdominal pain and admit for symptomatic choledicholithiasis on hospital day 0  Plan: #Symptomatic choledocholithiasis #Conjugated hyperbilirubinemia  Patient presented with several weeks of abdominal pain, nausea and vomiting. She is afebrile. She was intermittently treated with Keflex for a presumed UTI without any improvement in symptoms. Noted to have elevated LFT's on presentation and mixed hyperbilirubinemia on labs. Urinalysis with urobilinogen. RUQ [place] with multiple gall stones without evidence of acute cholecystitis. Patient previously received IV morphine in the ED and appeared comfortable on my exam. Abdominal exam benign without tenderness noted.  Differential includes biliary obstruction (recently passed gall stone), viral hepatitis, nonalcoholic steatohepatitis, primary biliary cholangitis, or medication induced, inherited liver disorders.  Acute hepatitis panel negative for viral etiology. Patient denies any recent skin changes, pruritis, or fatigue and there is no evidence of anemia on labs concerning for iron deficiency anemia; making primary biliary cholangitis less likely. Patient is only on PPI therapy and H2 blocker for GERD and recently started on Keflex. PPI therapy can cause minor  increases in LFT's; however, she is on low-dose and it is unlikely that this is cause of her symptoms. Will obtain MRCP to further evaluate for obstructive etiology.  -MRCP Abdomen w and wo contrast - GI consulted by EDP, appreciate recommendations - Pending MRCP results, will consider surgery consultation  - IV dilaudid 0.5mg  q4h prn for pain and IV zofran 4mg  prn for nausea  #Presumed UTI Patient was prescribed Keflex on 9/16 for UTI although she denies any urinary symptoms at this time. She reports that her abdominal pain has always been epigastric radiating diffusely and not suprapubic in nature. Urine culture with bacillus species. Repeat urinalysis with hematuria and leukocytes; however, does have significant epithelial cells. She does note having orange colored urine after  taking Azo. I suspect that this is secondary to the AZO vs urinary tract infection.  - Repeat urinalysis - If persistent, will resume antibiotics   Best Practice: Diet: NPO IVF: Fluids: None, Rate: None VTE: rivaroxaban (XARELTO) tablet 10 mg Start: 01/24/21 1000 Code: Full AB: None Status: Observation with expected length of stay less than 2 midnights. Anticipated Discharge Location: Home Barriers to Discharge: Medical stability  Signature: Eliezer Bottom, MD Internal Medicine Resident, PGY-3 [facility]  Pager: [phone] 8:30 AM, 01/24/2021   Please contact the on call pager after 5 pm and on weekends at [phone].

## 2021-01-24 NOTE — ED Triage Notes (Signed)
Pt reports that she has been having abd pain for the past week, went to UC the other day and diagnosed with UTI, pt continues, radiates to back, denies fevers.

## 2021-01-24 NOTE — ED Provider Notes (Signed)
[facility] EMERGENCY DEPARTMENT Provider Note   CSN: [id number] Arrival date & time: 01/24/21  0347     History Chief Complaint  Patient presents with   Abdominal Pain    Martha Hall is a 25 y.o. female with a history of hydrocephalus with reported history of shunt placement at birth, alpha thalassemia silent carrier, gestational diabetes, GERD during pregnancy who presents to the emergency department who presents to the emergency department with a chief complaint of abdominal pain.  The patient reports that she has been having intermittent abdominal pain for more than a week.  States that the pain radiates to her bilateral low back.  She reports that the pain is mostly constant, but she has been able to tolerate some ibuprofen and may get about an hour of relief from the pain.  She characterizes the pain as "feeling like someone is punching me in the stomach" and also "squeezing pain".  Pain is worse after eating and with positional changes.  She reports that the intensity of pain worsened tonight, which prompted her visit to the emergency department.  She also reports that she has had daily vomiting for the last 8 days.  She has been nonbloody, nonbilious vomiting on average 4-5 times daily.  Episodes of vomiting are associated with eating food.  She has been able to tolerate fluids, but has been vomiting almost every time that she tries to eat.  Despite vomiting, she reports that she has been voiding frequently she has been able to tolerate fluids.  Prior to onset of vomiting and abdominal pain, she reports that she had a "stomach bug" with generalized abdominal pain and diarrhea approximately 2 to 2-1/2 weeks ago that resolved about 2 days prior to the onset of vomiting and diarrhea.  She was seen at urgent care on 9/16 for the same and had a negative pregnancy test and a urinalysis.  She was diagnosed with UTI and started on Keflex.  She has been taking Keflex as  prescribed, but reports no improvement in her symptoms.  No fever, chills, chest pain, shortness of breath, diarrhea, constipation, vaginal bleeding, discharge, or pain, hematuria, dysuria, or weight loss.   The history is provided by the patient and medical records. No language interpreter was used.      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   Vaginal trichomoniasis 05/09/2019   Chlamydia infection affecting pregnancy 05/09/2019   Obesity in pregnancy 04/30/2019   Supervision of high risk pregnancy, antepartum, third trimester 04/23/2019    Past Surgical History:  Procedure Laterality Date   BRAIN SURGERY  1997[birth year]   stint placed when born     OB History     Gravida  1   Para  1   Term  1   Preterm      AB  0   Living  1      SAB  0   IAB      Ectopic  Multiple  0   Live Births  1           Family History  Problem Relation Age of Onset   Healthy Mother    Healthy Father     Social History   Tobacco Use   Smoking status: Never   Smokeless tobacco: Never  Vaping Use   Vaping Use: Never used  Substance Use Topics   Alcohol use: Never   Drug use: Never    Home Medications Prior to Admission medications   Medication Sig Start Date End Date Taking? Authorizing Provider  acetaminophen (TYLENOL) 500 MG tablet Take 1,000 mg by mouth every 6 (six) hours as needed for moderate pain or headache.   Yes [provider]  cephALEXin (KEFLEX) 500  MG capsule Take 1 capsule (500 mg total) by mouth 2 (two) times daily. Patient taking differently: Take 500 mg by mouth See admin instructions. Bid x 5 days 01/21/21  Yes Wallis Bamberg, PA-C  esomeprazole (NEXIUM) 20 MG capsule Take 1 capsule (20 mg total) by mouth daily before breakfast. 01/21/21  Yes Wallis Bamberg, PA-C  famotidine (PEPCID) 20 MG tablet Take 1 tablet (20 mg total) by mouth 2 (two) times daily. 01/21/21  Yes Wallis Bamberg, PA-C  ibuprofen (ADVIL) 200 MG tablet Take 600 mg by mouth every 6 (six) hours as needed for mild pain or headache.   Yes [provider]  meclizine (ANTIVERT) 25 MG tablet Take 1 tablet (25 mg total) by mouth 3 (three) times daily as needed for dizziness. 06/21/20  Yes Domenick Gong, MD  Phenazopyridine HCl (AZO URINARY PAIN RELIEF PO) Take 1 tablet by mouth every 4 (four) hours as needed (urinary pain).   Yes [provider]  prochlorperazine (COMPAZINE) 10 MG tablet Take 1 tablet (10 mg total) by mouth every 8 (eight) hours as needed for nausea or vomiting. 06/21/20  Yes Domenick Gong, MD  tiZANidine (ZANAFLEX) 4 MG tablet Take 1 tablet (4 mg total) by mouth every 8 (eight) hours as needed for muscle spasms. 12/19/20  Yes Rhys Martini, PA-C  ibuprofen (ADVIL) 800 MG tablet Take 1 tablet (800 mg total) by mouth 3 (three) times daily. Patient not taking: Reported on 01/24/2021 12/19/20   Rhys Martini, PA-C  medroxyPROGESTERone (DEPO-PROVERA) 150 MG/ML injection Inject 1 mL (150 mg total) into the muscle every 3 (three) months. Patient not taking: Reported on 01/24/2021 12/30/19   Johnny Bridge, MD  Prenatal Vit-Fe Fumarate-FA (PRENATAL VITAMIN) 27-0.8 MG TABS Take 1 tablet by mouth daily. Patient not taking: Reported on 01/24/2021 07/16/19   Joselyn Arrow, MD  diphenhydrAMINE (BENADRYL) 25 MG tablet Take 12.5 mg by mouth every 6 (six) hours as needed for allergies. Patient not taking: Reported on 12/30/2019  06/21/20  [provider]     Allergies    Metoclopramide  Review of Systems   Review of Systems  Constitutional:  Negative for activity change, chills and fever.  HENT:  Negative for congestion and sore throat.   Eyes:  Negative for visual disturbance.  Respiratory:  Negative for cough, shortness of breath and wheezing.   Cardiovascular:  Negative for chest pain and palpitations.  Gastrointestinal:  Positive for abdominal pain, nausea and vomiting. Negative for anal bleeding, blood in stool, constipation and diarrhea.  Genitourinary:  Negative for dysuria, flank pain, genital sores, menstrual problem, urgency, vaginal bleeding and vaginal pain.  Musculoskeletal:  Negative for back pain, gait problem, myalgias, neck pain and neck stiffness.  Skin:  Negative for rash.  Allergic/Immunologic: Negative for immunocompromised state.  Neurological:  Negative for seizures, syncope, weakness, light-headedness, numbness and headaches.  Psychiatric/Behavioral:  Negative for confusion.    Physical Exam Updated Vital Signs BP (!) 106/55   Pulse 71   Temp (!) 97.4 F (36.3 C)   Resp 16   LMP 01/17/2021 (Approximate)   SpO2 100%   Physical Exam Vitals and nursing note reviewed.  Constitutional:      General: She is not in acute distress. HENT:     Head: Normocephalic.  Eyes:     Conjunctiva/sclera: Conjunctivae normal.  Cardiovascular:     Rate and Rhythm: Normal rate and regular rhythm.     Pulses: Normal pulses.     Heart sounds: Normal heart sounds. No murmur heard.   No friction rub. No gallop.  Pulmonary:     Effort: Pulmonary effort is normal. No respiratory distress.     Breath sounds: No stridor. No wheezing, rhonchi or rales.  Chest:     Chest wall: No tenderness.  Abdominal:     General: There is no distension.     Palpations: Abdomen is soft. There is no mass.     Tenderness: There is abdominal tenderness. There is no right CVA tenderness, left CVA tenderness, guarding or rebound.     Hernia:  No hernia is present.     Comments: Tender to palpation in the bilateral upper abdomen, right greater than left.  She has a positive Murphy sign.  Abdomen is protuberant, but soft and nondistended.  Normoactive bowel sounds.  No CVA tenderness bilaterally.  No tenderness over McBurney's point.   Musculoskeletal:     Cervical back: Neck supple.  Skin:    General: Skin is warm.     Coloration: Skin is not jaundiced.     Findings: No rash.  Neurological:     Mental Status: She is alert.  Psychiatric:        Behavior: Behavior normal.    ED Results / Procedures / Treatments   Labs (all labs ordered are listed, but only abnormal results are displayed) Labs Reviewed  COMPREHENSIVE METABOLIC PANEL - Abnormal; Notable for the following components:      Result Value   Glucose, Bld 112 (*)    Albumin 3.4 (*)    AST 142 (*)    ALT 456 (*)    Alkaline Phosphatase 158 (*)    Total Bilirubin 3.8 (*)    All other components within normal limits  URINALYSIS, ROUTINE W REFLEX MICROSCOPIC - Abnormal; Notable for the following components:   Color, Urine AMBER (*)    APPearance HAZY (*)    Specific Gravity, Urine >1.030 (*)    Hgb urine dipstick LARGE (*)    Bilirubin Urine LARGE (*)    Leukocytes,Ua MODERATE (*)    All other components within normal limits  URINALYSIS, MICROSCOPIC (REFLEX) - Abnormal; Notable for the following components:   Bacteria, UA FEW (*)    All other components within normal limits  RESP PANEL BY RT-PCR (FLU A&B, COVID) ARPGX2  LIPASE, BLOOD  CBC  I-STAT BETA HCG BLOOD, ED ([facility], [facility], [facility] ONLY)    EKG None  Radiology US Abdomen Limited RUQ (LIVER/GB)  Result Date: 01/24/2021 CLINICAL DATA:  Right upper quadrant pain EXAM: ULTRASOUND ABDOMEN LIMITED RIGHT UPPER QUADRANT COMPARISON:  None. FINDINGS: Gallbladder: Multiple gallstones. There is regional tenderness but no wall thickening or pericholecystic edema. Common bile duct: Diameter: 5 mm.  Where visualized, no  filling defect. Liver: No focal lesion identified. Within normal limits in parenchymal echogenicity. Portal vein is patent on color Doppler imaging with normal direction of blood flow towards the liver. Other: None. IMPRESSION: Numerous gallstones. The gallbladder is tender but there is no wall thickening or fluid typical of acute cholecystitis. Electronically Signed   By: Tiburcio Pea M.D.   On: 01/24/2021 05:52    Procedures Procedures   Medications Ordered in ED Medications  ondansetron (ZOFRAN-ODT) disintegrating tablet 4 mg (4 mg Oral Given 01/24/21 0501)  sodium chloride 0.9 % bolus 1,000 mL (0 mLs Intravenous Stopped 01/24/21 0626)  morphine 4 MG/ML injection 4 mg (4 mg Intravenous Given 01/24/21 0517)    ED Course  I have reviewed the triage vital signs and the nursing notes.  Pertinent labs & imaging results that were available during my care of the patient were reviewed by me and considered in my medical decision making (see chart for details).    MDM Rules/Calculators/A&P                           25 year old female with a history of hydrocephalus with reported history of shunt placement at birth, alpha thalassemia silent carrier, gestational diabetes, GERD during pregnancy who presents to the emergency department with an 8-day history of postprandial abdominal pain and vomiting.  No constitutional symptoms.  Patient was seen at urgent care 3 days ago and diagnosed with a UTI.  She has been taking Keflex with no improvement in her symptoms.  Vital signs are stable.  I reviewed the patient's urgent care visit.  She had a positive urine culture for bacillus species.  Urine also notably has 3+ proteinuria and bilirubinuria.  On exam, she has bilateral tenderness to palpation in the upper abdomen, right greater than left and has a positive Murphy sign.  Will order labs and right upper quadrant ultrasound.  Labs have been reviewed and independently interpreted by me.  She has elevated  transaminases, 142 and 456 of her AST and ALT respectively.  Alkaline phosphatase is 158.  Total bilirubin is 3.8.  UA appears contaminated.  UA with urobilirubin.  Right upper quadrant ultrasound with numerous gallstones.  Gallbladder is tender, but no wall thickening or fluid suggestive of cholecystitis.  Common bile duct is 5 mm, upper limit of normal.  Could consider that patient recently passed a stone or has choledocholithiasis.  Given changes in her labs, she will require admission for further work-up and evaluation.  I sent a secure chat to Dr. Tomasa Rand, gastroenterology.  PA Ala Dach will speak with admitting service.   Patient recheck and she reports that nausea has resolved and pain is well controlled.  The patient appears reasonably stabilized for admission considering the current resources, flow, and capabilities available in the ED at this time, and I doubt any other [facility] requiring further screening and/or treatment in the ED prior to admission.    Final Clinical Impression(s) / ED Diagnoses Final diagnoses:  RUQ abdominal pain    Rx / DC Orders ED Discharge Orders     None        Barkley Boards, PA-C 01/24/21 0648    Nira Conn, MD 01/24/21 [phone]

## 2021-01-24 NOTE — Plan of Care (Signed)

## 2021-01-25 ENCOUNTER — Encounter (HOSPITAL_COMMUNITY)
Admission: EM | Disposition: A | Discharge status: Home / Self Care | Payer: Self-pay | Source: Home / Self Care | Attending: Internal Medicine

## 2021-01-25 ENCOUNTER — Observation Stay (HOSPITAL_COMMUNITY): Payer: Medicaid Other

## 2021-01-25 ENCOUNTER — Encounter (HOSPITAL_COMMUNITY): Payer: Self-pay | Admitting: Internal Medicine

## 2021-01-25 ENCOUNTER — Observation Stay (HOSPITAL_COMMUNITY): Payer: Medicaid Other | Admitting: Anesthesiology

## 2021-01-25 DIAGNOSIS — K8020 Calculus of gallbladder without cholecystitis without obstruction: Secondary | ICD-10-CM | POA: Diagnosis not present

## 2021-01-25 DIAGNOSIS — K8063 Calculus of gallbladder and bile duct with acute cholecystitis with obstruction: Secondary | ICD-10-CM | POA: Diagnosis not present

## 2021-01-25 DIAGNOSIS — E669 Obesity, unspecified: Secondary | ICD-10-CM | POA: Diagnosis not present

## 2021-01-25 DIAGNOSIS — Z87440 Personal history of urinary (tract) infections: Secondary | ICD-10-CM | POA: Diagnosis not present

## 2021-01-25 DIAGNOSIS — R7401 Elevation of levels of liver transaminase levels: Secondary | ICD-10-CM | POA: Diagnosis not present

## 2021-01-25 DIAGNOSIS — I959 Hypotension, unspecified: Secondary | ICD-10-CM | POA: Diagnosis not present

## 2021-01-25 DIAGNOSIS — N179 Acute kidney failure, unspecified: Secondary | ICD-10-CM | POA: Diagnosis not present

## 2021-01-25 DIAGNOSIS — K810 Acute cholecystitis: Secondary | ICD-10-CM | POA: Diagnosis not present

## 2021-01-25 DIAGNOSIS — K8051 Calculus of bile duct without cholangitis or cholecystitis with obstruction: Secondary | ICD-10-CM | POA: Diagnosis not present

## 2021-01-25 DIAGNOSIS — D563 Thalassemia minor: Secondary | ICD-10-CM | POA: Diagnosis not present

## 2021-01-25 DIAGNOSIS — D62 Acute posthemorrhagic anemia: Secondary | ICD-10-CM | POA: Diagnosis not present

## 2021-01-25 DIAGNOSIS — Z6841 Body Mass Index (BMI) 40.0 and over, adult: Secondary | ICD-10-CM | POA: Diagnosis not present

## 2021-01-25 DIAGNOSIS — K661 Hemoperitoneum: Secondary | ICD-10-CM | POA: Diagnosis not present

## 2021-01-25 DIAGNOSIS — K82A1 Gangrene of gallbladder in cholecystitis: Secondary | ICD-10-CM | POA: Diagnosis not present

## 2021-01-25 DIAGNOSIS — R17 Unspecified jaundice: Secondary | ICD-10-CM | POA: Diagnosis not present

## 2021-01-25 DIAGNOSIS — K219 Gastro-esophageal reflux disease without esophagitis: Secondary | ICD-10-CM | POA: Diagnosis not present

## 2021-01-25 DIAGNOSIS — K831 Obstruction of bile duct: Secondary | ICD-10-CM | POA: Diagnosis not present

## 2021-01-25 DIAGNOSIS — R1011 Right upper quadrant pain: Secondary | ICD-10-CM | POA: Diagnosis not present

## 2021-01-25 DIAGNOSIS — R109 Unspecified abdominal pain: Secondary | ICD-10-CM | POA: Diagnosis not present

## 2021-01-25 DIAGNOSIS — K8010 Calculus of gallbladder with chronic cholecystitis without obstruction: Secondary | ICD-10-CM | POA: Diagnosis not present

## 2021-01-25 DIAGNOSIS — Z20822 Contact with and (suspected) exposure to covid-19: Secondary | ICD-10-CM | POA: Diagnosis not present

## 2021-01-25 DIAGNOSIS — Z9049 Acquired absence of other specified parts of digestive tract: Secondary | ICD-10-CM | POA: Diagnosis not present

## 2021-01-25 DIAGNOSIS — Z888 Allergy status to other drugs, medicaments and biological substances status: Secondary | ICD-10-CM | POA: Diagnosis not present

## 2021-01-25 HISTORY — PX: CHOLECYSTECTOMY: SHX55

## 2021-01-25 LAB — COMPREHENSIVE METABOLIC PANEL
ALT: 352 U/L — ABNORMAL HIGH (ref 0–44)
AST: 153 U/L — ABNORMAL HIGH (ref 15–41)
Albumin: 3.1 g/dL — ABNORMAL LOW (ref 3.5–5.0)
Alkaline Phosphatase: 157 U/L — ABNORMAL HIGH (ref 38–126)
Anion gap: 9 (ref 5–15)
BUN: <5 mg/dL — ABNORMAL LOW (ref 6–20)
CO2: 25 mmol/L (ref 22–32)
Calcium: 9.2 mg/dL (ref 8.9–10.3)
Chloride: 104 mmol/L (ref 98–111)
Creatinine, Ser: 0.83 mg/dL (ref 0.44–1.00)
GFR, Estimated: >60 mL/min (ref >60)
Glucose, Bld: 100 mg/dL — ABNORMAL HIGH (ref 70–99)
Potassium: 3.9 mmol/L (ref 3.5–5.1)
Sodium: 138 mmol/L (ref 135–145)
Total Bilirubin: 3.6 mg/dL — ABNORMAL HIGH (ref 0.3–1.2)
Total Protein: 6.4 g/dL — ABNORMAL LOW (ref 6.5–8.1)

## 2021-01-25 LAB — CBC
HCT: 37.9 % (ref 36.0–46.0)
HCT: 34.7 % — ABNORMAL LOW (ref 36.0–46.0)
Hemoglobin: 12.3 g/dL (ref 12.0–15.0)
Hemoglobin: 11.1 g/dL — ABNORMAL LOW (ref 12.0–15.0)
MCH: 26.8 pg (ref 26.0–34.0)
MCH: 26.6 pg (ref 26.0–34.0)
MCHC: 32.5 g/dL (ref 30.0–36.0)
MCHC: 32 g/dL (ref 30.0–36.0)
MCV: 82.6 fL (ref 80.0–100.0)
MCV: 83.2 fL (ref 80.0–100.0)
Platelets: 264 10*3/uL (ref 150–400)
Platelets: 411 10*3/uL — ABNORMAL HIGH (ref 150–400)
RBC: 4.59 MIL/uL (ref 3.87–5.11)
RBC: 4.17 MIL/uL (ref 3.87–5.11)
RDW: 14.2 % (ref 11.5–15.5)
RDW: 14.1 % (ref 11.5–15.5)
WBC: 4.8 10*3/uL (ref 4.0–10.5)
WBC: 11.6 10*3/uL — ABNORMAL HIGH (ref 4.0–10.5)
nRBC: 0 % (ref 0.0–0.2)
nRBC: 0 % (ref 0.0–0.2)

## 2021-01-25 LAB — HIV ANTIBODY (ROUTINE TESTING W REFLEX): HIV Screen 4th Generation wRfx: NONREACTIVE

## 2021-01-25 IMAGING — RF DG CHOLANGIOGRAM OPERATIVE
1 series · 5 of 5 positions shown · non-contrast
Comparison: Abdominal MRI-[DATE]

CLINICAL DATA: Intraoperative cholangiogram during laparoscopic
cholecystectomy.

EXAM:
INTRAOPERATIVE CHOLANGIOGRAM
FLUOROSCOPY TIME:  14 seconds (8.0 mGy)

[Series 1: run · 2 acquisitions, 5 frames shown]
[im 1/2]
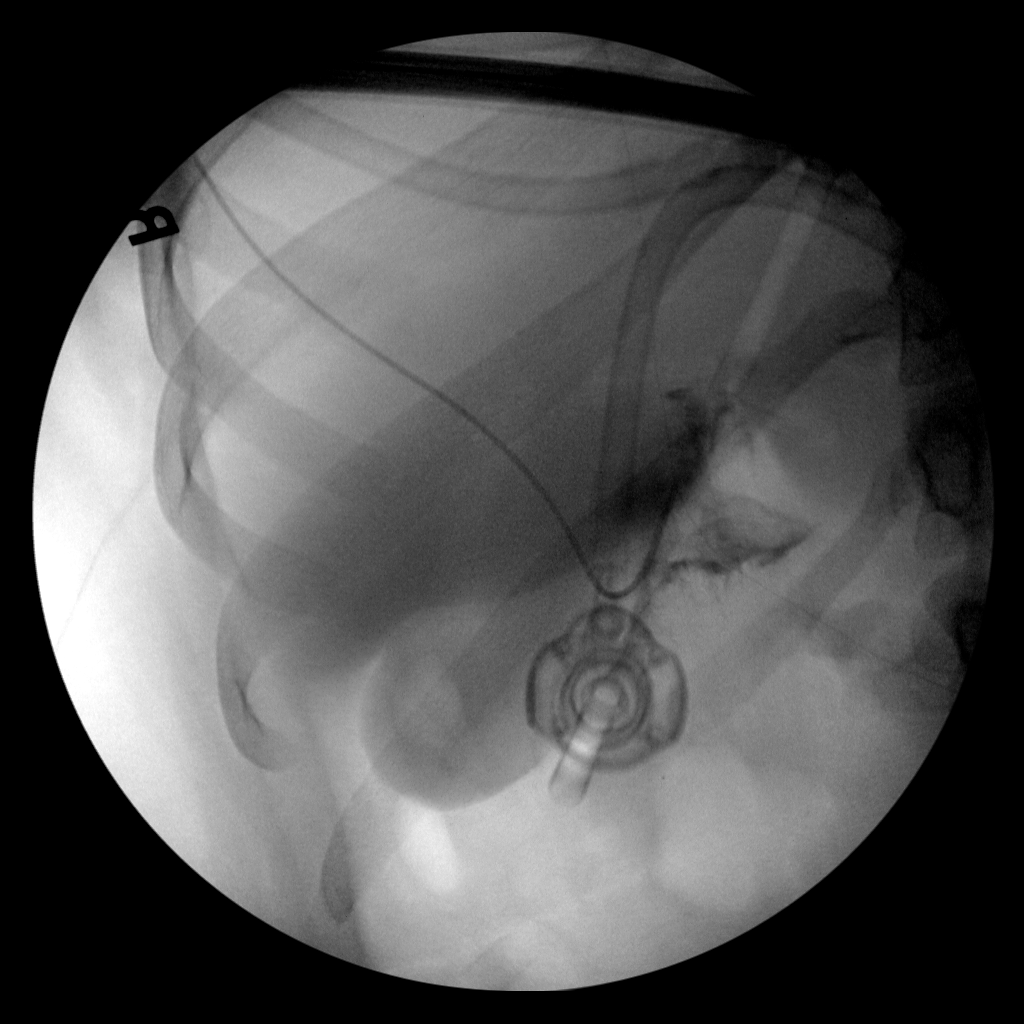
[im 1/2]
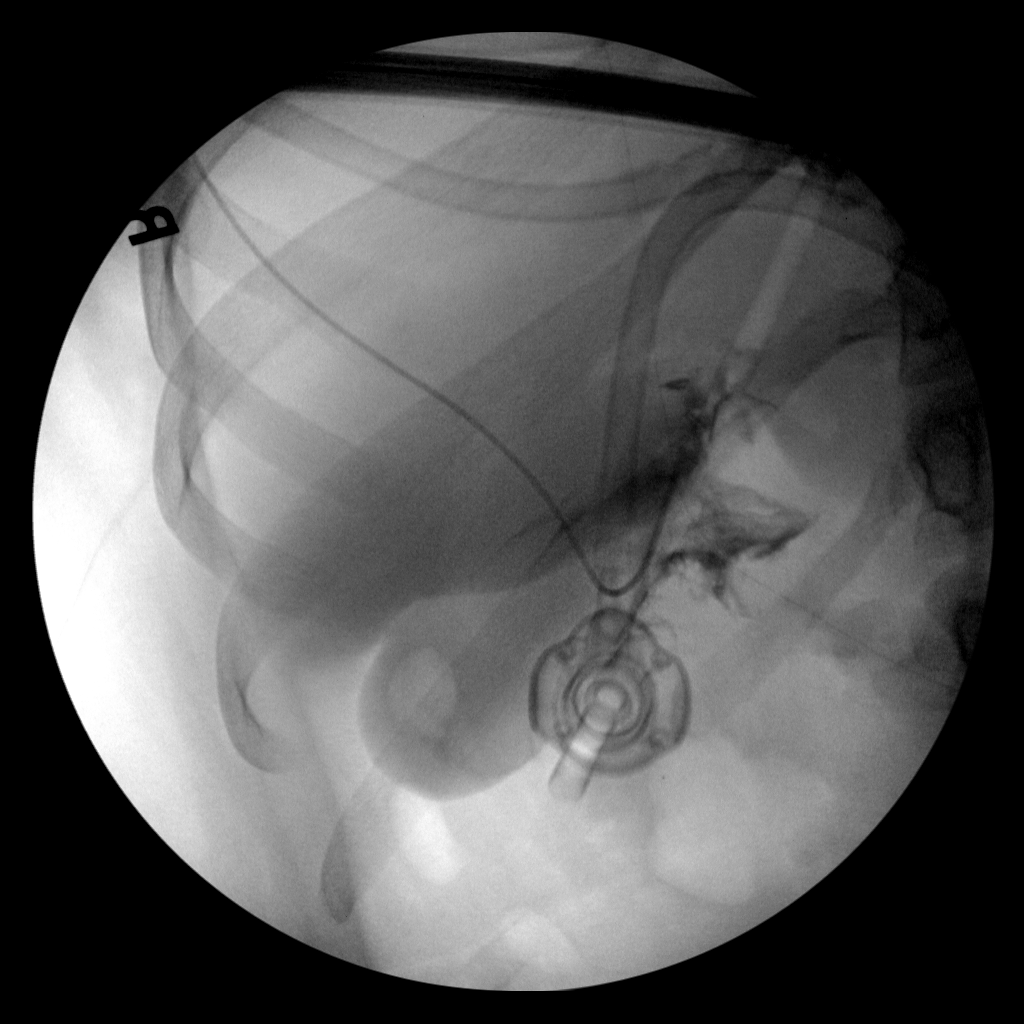
[im 1/2]
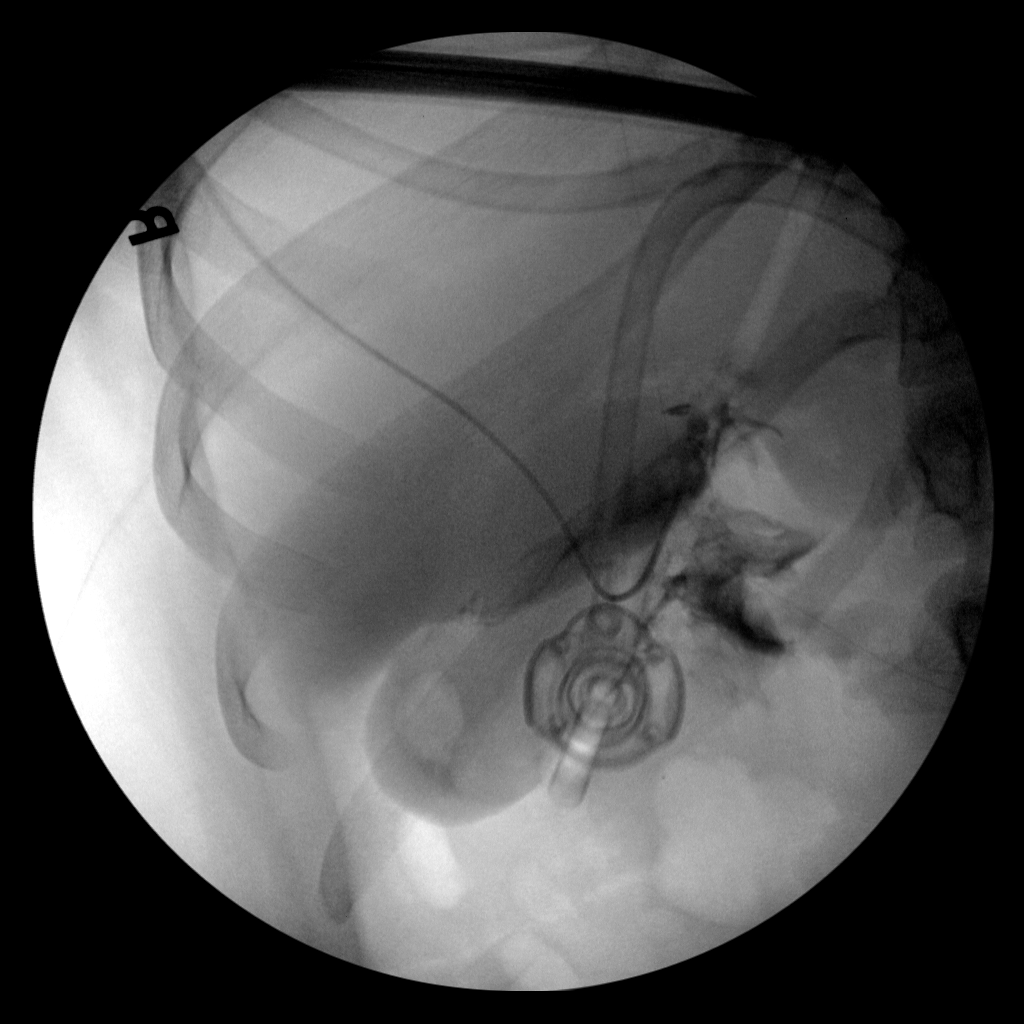
[im 1/2]
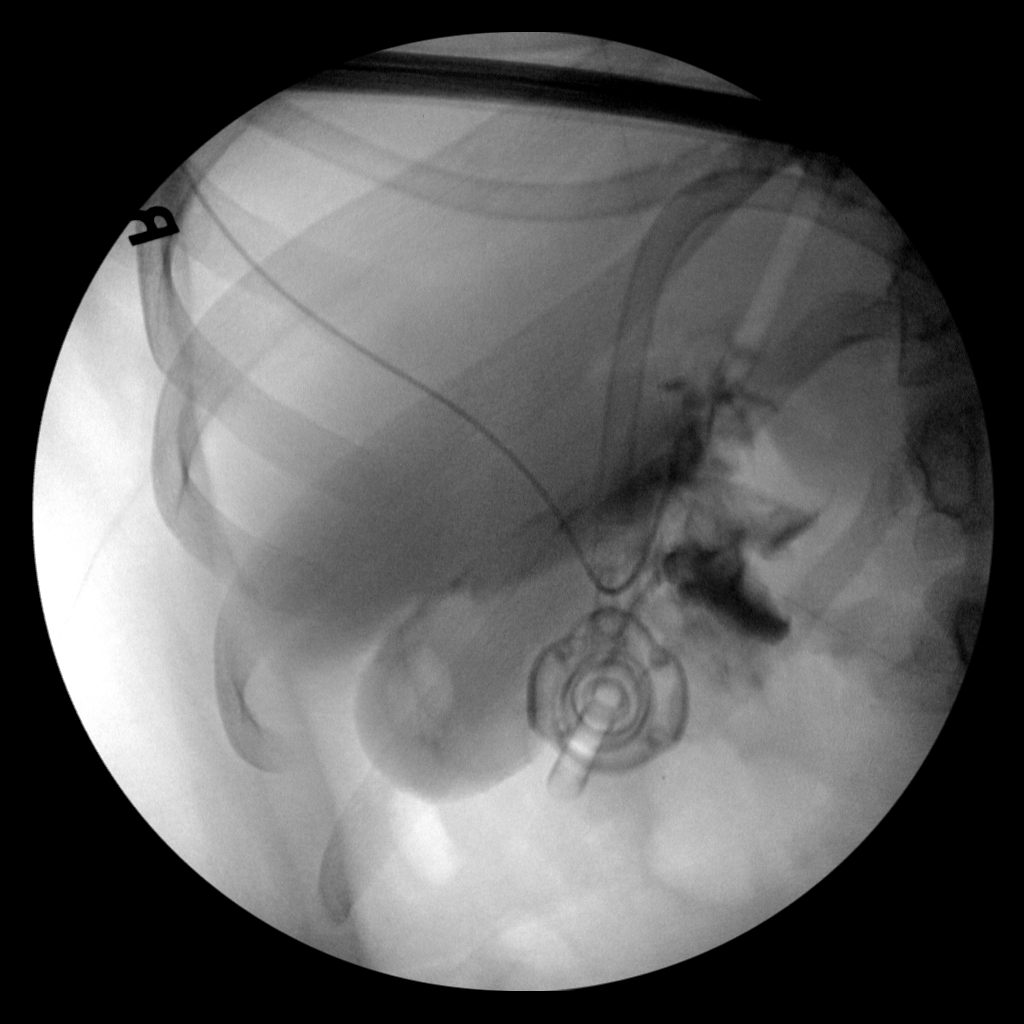
[im 2/2]
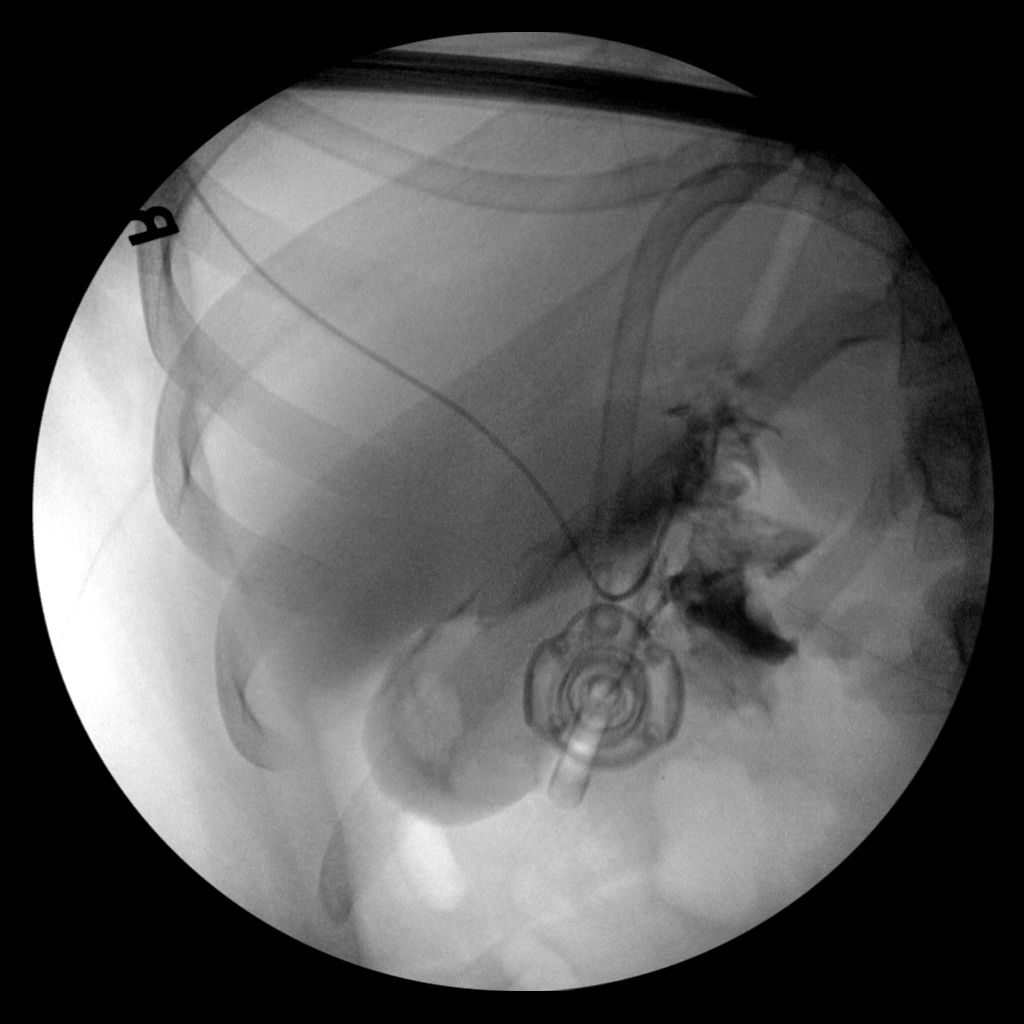

[5 of 5 positions shown; findings below may reference images not displayed]

FINDINGS: Intraoperative cholangiographic images of the right upper abdominal
quadrant during laparoscopic cholecystectomy are provided for
review.

There is extravasation of contrast about the attempted cystic duct
cannulation site without definitive opacification of either the
cystic duct or intrahepatic biliary tree.

There is partial opacification of the gallbladder with apparent
filling defect at the level of the gallbladder fundus suggestive of
cholelithiasis.
IMPRESSION: Nondiagnostic intraoperative cholangiogram. Further evaluation with
ERCP could be performed as indicated.

## 2021-01-25 SURGERY — LAPAROSCOPIC CHOLECYSTECTOMY
Anesthesia: General | Site: Abdomen

## 2021-01-25 MED ORDER — LIDOCAINE HCL (CARDIAC) PF 100 MG/5ML IV SOSY
PREFILLED_SYRINGE | INTRAVENOUS | Status: DC | PRN
  Administered 2021-01-25: 80 mg via INTRAVENOUS

## 2021-01-25 MED ORDER — ONDANSETRON HCL 4 MG/2ML IJ SOLN: 4.0000 mg | Freq: Once | INTRAMUSCULAR | Status: DC | PRN

## 2021-01-25 MED ORDER — SCOPOLAMINE 1 MG/3DAYS TD PT72
1.0000 | MEDICATED_PATCH | TRANSDERMAL | Status: DC
  Administered 2021-01-25 – 2021-01-31 (×3): 1.5 mg via TRANSDERMAL
  Filled 2021-01-25 (×3): qty 1

## 2021-01-25 MED ORDER — SCOPOLAMINE 1 MG/3DAYS TD PT72
MEDICATED_PATCH | TRANSDERMAL | Status: AC
  Filled 2021-01-25: qty 1

## 2021-01-25 MED ORDER — OXYCODONE HCL 5 MG/5ML PO SOLN: 5.0000 mg | Freq: Once | ORAL | Status: AC | PRN

## 2021-01-25 MED ORDER — PROPOFOL 10 MG/ML IV BOLUS
INTRAVENOUS | Status: DC | PRN
  Administered 2021-01-25: 180 mg via INTRAVENOUS

## 2021-01-25 MED ORDER — SODIUM CHLORIDE 0.9 % IV SOLN
INTRAVENOUS | Status: DC | PRN
  Administered 2021-01-25: 10 mL

## 2021-01-25 MED ORDER — SODIUM CHLORIDE 0.9 % IR SOLN
Status: DC | PRN
  Administered 2021-01-25: 1000 mL

## 2021-01-25 MED ORDER — PROPOFOL 10 MG/ML IV BOLUS
INTRAVENOUS | Status: AC
  Filled 2021-01-25: qty 20

## 2021-01-25 MED ORDER — SUGAMMADEX SODIUM 500 MG/5ML IV SOLN
INTRAVENOUS | Status: DC | PRN
Start: 2021-01-25 — End: 2021-01-25
  Administered 2021-01-25: 500 mg via INTRAVENOUS

## 2021-01-25 MED ORDER — FENTANYL CITRATE (PF) 100 MCG/2ML IJ SOLN
INTRAMUSCULAR | Status: AC
  Filled 2021-01-25: qty 2

## 2021-01-25 MED ORDER — CEFAZOLIN SODIUM-DEXTROSE 2-4 GM/100ML-% IV SOLN
INTRAVENOUS | Status: AC
  Filled 2021-01-25: qty 100

## 2021-01-25 MED ORDER — 0.9 % SODIUM CHLORIDE (POUR BTL) OPTIME
TOPICAL | Status: DC | PRN
  Administered 2021-01-25: 1000 mL

## 2021-01-25 MED ORDER — OXYCODONE HCL 5 MG PO TABS
ORAL_TABLET | ORAL | Status: AC
  Filled 2021-01-25: qty 1

## 2021-01-25 MED ORDER — FENTANYL CITRATE (PF) 100 MCG/2ML IJ SOLN
25.0000 ug | INTRAMUSCULAR | Status: DC | PRN
  Administered 2021-01-25 (×3): 50 ug via INTRAVENOUS

## 2021-01-25 MED ORDER — BUPIVACAINE-EPINEPHRINE 0.5% -1:200000 IJ SOLN
INTRAMUSCULAR | Status: AC
  Filled 2021-01-25: qty 2

## 2021-01-25 MED ORDER — KETOROLAC TROMETHAMINE 30 MG/ML IJ SOLN
30.0000 mg | Freq: Four times a day (QID) | INTRAMUSCULAR | Status: DC
  Administered 2021-01-25 – 2021-01-26 (×3): 30 mg via INTRAVENOUS
  Filled 2021-01-25 (×3): qty 1

## 2021-01-25 MED ORDER — BUPIVACAINE-EPINEPHRINE 0.25% -1:200000 IJ SOLN
INTRAMUSCULAR | Status: DC | PRN
  Administered 2021-01-25: 20 mL

## 2021-01-25 MED ORDER — FENTANYL CITRATE (PF) 100 MCG/2ML IJ SOLN
INTRAMUSCULAR | Status: DC | PRN
  Administered 2021-01-25 (×3): 50 ug via INTRAVENOUS
  Administered 2021-01-25: 100 ug via INTRAVENOUS

## 2021-01-25 MED ORDER — LACTATED RINGERS IV BOLUS
1000.0000 mL | Freq: Once | INTRAVENOUS | Status: AC
  Administered 2021-01-25: 1000 mL via INTRAVENOUS

## 2021-01-25 MED ORDER — HYDROMORPHONE HCL 1 MG/ML IJ SOLN
1.0000 mg | INTRAMUSCULAR | Status: DC | PRN
  Administered 2021-01-25 – 2021-01-29 (×12): 1 mg via INTRAVENOUS
  Filled 2021-01-25 (×13): qty 1

## 2021-01-25 MED ORDER — OXYCODONE-ACETAMINOPHEN 5-325 MG PO TABS: 1.0000 | ORAL_TABLET | ORAL | Status: DC | PRN

## 2021-01-25 MED ORDER — CHLORHEXIDINE GLUCONATE 0.12 % MT SOLN
OROMUCOSAL | Status: AC
  Administered 2021-01-25: 15 mL via OROMUCOSAL
  Filled 2021-01-25: qty 15

## 2021-01-25 MED ORDER — ROCURONIUM BROMIDE 100 MG/10ML IV SOLN
INTRAVENOUS | Status: DC | PRN
  Administered 2021-01-25: 70 mg via INTRAVENOUS

## 2021-01-25 MED ORDER — FENTANYL CITRATE (PF) 250 MCG/5ML IJ SOLN
INTRAMUSCULAR | Status: AC
  Filled 2021-01-25: qty 5

## 2021-01-25 MED ORDER — ORAL CARE MOUTH RINSE: 15.0000 mL | Freq: Once | OROMUCOSAL | Status: AC

## 2021-01-25 MED ORDER — CHLORHEXIDINE GLUCONATE 0.12 % MT SOLN: 15.0000 mL | Freq: Once | OROMUCOSAL | Status: AC

## 2021-01-25 MED ORDER — LACTATED RINGERS IV SOLN: INTRAVENOUS | Status: DC

## 2021-01-25 MED ORDER — CEFAZOLIN SODIUM-DEXTROSE 2-4 GM/100ML-% IV SOLN
2.0000 g | Freq: Once | INTRAVENOUS | Status: AC
  Administered 2021-01-25: 2 g via INTRAVENOUS

## 2021-01-25 MED ORDER — ENOXAPARIN SODIUM 40 MG/0.4ML IJ SOSY: 40.0000 mg | PREFILLED_SYRINGE | INTRAMUSCULAR | Status: DC

## 2021-01-25 MED ORDER — LACTATED RINGERS IV SOLN: INTRAVENOUS | Status: AC

## 2021-01-25 MED ORDER — MIDAZOLAM HCL 2 MG/2ML IJ SOLN
INTRAMUSCULAR | Status: AC
  Filled 2021-01-25: qty 2

## 2021-01-25 MED ORDER — MIDAZOLAM HCL 5 MG/5ML IJ SOLN
INTRAMUSCULAR | Status: DC | PRN
  Administered 2021-01-25: 2 mg via INTRAVENOUS

## 2021-01-25 MED ORDER — DEXAMETHASONE SODIUM PHOSPHATE 10 MG/ML IJ SOLN
INTRAMUSCULAR | Status: DC | PRN
  Administered 2021-01-25: 10 mg via INTRAVENOUS

## 2021-01-25 MED ORDER — OXYCODONE HCL 5 MG PO TABS
5.0000 mg | ORAL_TABLET | Freq: Once | ORAL | Status: AC | PRN
  Administered 2021-01-25: 5 mg via ORAL

## 2021-01-25 SURGICAL SUPPLY — 36 items
APPLIER CLIP 5 13 M/L LIGAMAX5 (MISCELLANEOUS) ×3
BAG COUNTER SPONGE SURGICOUNT (BAG) ×3 IMPLANT
BLADE CLIPPER SURG (BLADE) IMPLANT
CANISTER SUCT 3000ML PPV (MISCELLANEOUS) ×3 IMPLANT
CHLORAPREP W/TINT 26 (MISCELLANEOUS) ×3 IMPLANT
CLIP APPLIE 5 13 M/L LIGAMAX5 (MISCELLANEOUS) ×2 IMPLANT
COVER MAYO STAND STRL (DRAPES) ×3 IMPLANT
COVER SURGICAL LIGHT HANDLE (MISCELLANEOUS) ×3 IMPLANT
DERMABOND ADVANCED (GAUZE/BANDAGES/DRESSINGS) ×1
DERMABOND ADVANCED .7 DNX12 (GAUZE/BANDAGES/DRESSINGS) ×2 IMPLANT
DRAPE C-ARM 42X120 X-RAY (DRAPES) ×3 IMPLANT
ELECT REM PT RETURN 9FT ADLT (ELECTROSURGICAL) ×3
ELECTRODE REM PT RTRN 9FT ADLT (ELECTROSURGICAL) ×2 IMPLANT
GLOVE SURG SIGNA 7.5 PF LTX (GLOVE) ×3 IMPLANT
GOWN STRL REUS W/ TWL LRG LVL3 (GOWN DISPOSABLE) ×6 IMPLANT
GOWN STRL REUS W/ TWL XL LVL3 (GOWN DISPOSABLE) ×2 IMPLANT
GOWN STRL REUS W/TWL LRG LVL3 (GOWN DISPOSABLE) ×3
GOWN STRL REUS W/TWL XL LVL3 (GOWN DISPOSABLE) ×1
KIT BASIN OR (CUSTOM PROCEDURE TRAY) ×3 IMPLANT
KIT TURNOVER KIT B (KITS) ×3 IMPLANT
NS IRRIG 1000ML POUR BTL (IV SOLUTION) ×3 IMPLANT
PAD ARMBOARD 7.5X6 YLW CONV (MISCELLANEOUS) ×3 IMPLANT
POUCH SPECIMEN RETRIEVAL 10MM (ENDOMECHANICALS) ×3 IMPLANT
SCISSORS LAP 5X35 DISP (ENDOMECHANICALS) ×3 IMPLANT
SET CHOLANGIOGRAPH 5 50 .035 (SET/KITS/TRAYS/PACK) ×3 IMPLANT
SET IRRIG TUBING LAPAROSCOPIC (IRRIGATION / IRRIGATOR) ×3 IMPLANT
SET TUBE SMOKE EVAC HIGH FLOW (TUBING) ×3 IMPLANT
SLEEVE ENDOPATH XCEL 5M (ENDOMECHANICALS) ×6 IMPLANT
SPECIMEN JAR SMALL (MISCELLANEOUS) ×3 IMPLANT
SUT MNCRL AB 4-0 PS2 18 (SUTURE) ×3 IMPLANT
TOWEL GREEN STERILE (TOWEL DISPOSABLE) ×3 IMPLANT
TOWEL GREEN STERILE FF (TOWEL DISPOSABLE) ×3 IMPLANT
TRAY LAPAROSCOPIC MC (CUSTOM PROCEDURE TRAY) ×3 IMPLANT
TROCAR XCEL BLUNT TIP 100MML (ENDOMECHANICALS) ×3 IMPLANT
TROCAR XCEL NON-BLD 5MMX100MML (ENDOMECHANICALS) ×3 IMPLANT
WATER STERILE IRR 1000ML POUR (IV SOLUTION) ×3 IMPLANT

## 2021-01-25 NOTE — Progress Notes (Signed)
[name], female   DOB: 14-Jul-1995, 25 y.o.   MRN: [id number]   Pre Procedure note for inpatients:   Cira Habermehl has been scheduled for Procedure(s): LAPAROSCOPIC CHOLECYSTECTOMY WITH INTRAOPERATIVE CHOLANGIOGRAM (N/A) today. The various methods of treatment have been discussed with the patient. After consideration of the risks, benefits and treatment options the patient has consented to the planned procedure.   The patient has been seen and labs reviewed. There are no changes in the patient's condition to prevent proceeding with the planned procedure today.  Recent labs:  Lab Results  Component Value Date   WBC 4.8 01/25/2021   HGB 12.3 01/25/2021   HCT 37.9 01/25/2021   PLT 264 01/25/2021   GLUCOSE 100 (H) 01/25/2021   ALT 352 (H) 01/25/2021   AST 153 (H) 01/25/2021   NA 138 01/25/2021   K 3.9 01/25/2021   CL 104 01/25/2021   CREATININE 0.83 01/25/2021   BUN <5 (L) 01/25/2021   CO2 25 01/25/2021   HGBA1C 5.6 04/30/2019    Abigail Miyamoto, MD 01/25/2021 8:31 AM

## 2021-01-25 NOTE — Progress Notes (Signed)
Nutrition Consult - Diet education  Received consult for low fat diet education. RD visited patient to discuss low fat diet, but patient was very sleepy after surgery. Left low fat education handouts at bedside for patient to review. RD contact information provided. Will follow-up as able to complete verbal education and answer questions.  Gabriel Rainwater, RD, LDN, CNSC Please refer to [facility] for contact information.

## 2021-01-25 NOTE — Op Note (Signed)
PATIENT:  Martha Hall  25 y.o. female  PRE-OPERATIVE DIAGNOSIS:  SYMPTOMATIC CHOLELITHIASIS  POST-OPERATIVE DIAGNOSIS:  SYMPTOMATIC CHOLELITHIASIS  PROCEDURE:  Procedure(s): LAPAROSCOPIC CHOLECYSTECTOMY WITH ATTEMPTED CHOLANGIOGRAM  SURGEON:  Abigail Miyamoto, MD   ASSISTANT: Aquilla Solian, MD  ANESTHESIA:   local and general  Indications for procedure: Martha Hall is a 25 y.o. female with symptoms of Abdominal pain and Nausea and vomiting consistent with gallbladder disease, Confirmed by ultrasound and MRI.  Description of procedure: The patient was brought into the operative suite, placed supine. Anesthesia was administered with endotracheal tube. Patient was strapped in place and foot board was secured. All pressure points were offloaded by foam padding. The patient was prepped and draped in the usual sterile fashion.  In Hasson fashion, an infraumbilical incision was made, fascia identified with blunt dissection, the fascia was divided and trocar placed. 2 5 mm trocars were placed on in the right lateral space on in the right subcostal space. A 55mm trocar was placed in the subxiphoid space. Marcaine with Epinephrine was infused to the subxiphoid space and lateral upper right abdomen in the transversus abdominis plane. Next the patient was placed in reverse trendelenberg. The gallbladder appeared dilated, acutely inflamed, and gangrenous. Minimal amount of omentum was adhered to the gallbladder and was taken down with cautery/blunt dissection.   The gallbladder was retracted cephalad and lateral. The peritoneum was reflected off the infundibulum working lateral to medial. The cystic duct and cystic artery were identified and further dissection revealed a critical view, due to concern for choledocholithiasis a cholangiogram was performed with Rolene Arbour. A small stone was milked from the cystic duct. The cholangiogram, however, was unsuccessful and aborted due to the friable nature of  the cystic duct. The cystic duct was triply clipped and cystic artery was doubly clipped and ligated.   The gallbladder was removed off the liver bed with cautery. The Gallbladder was placed in a specimen bag. During the dissection, a small injury to the anterior gallbladder wall led to spillage of bile. Due to the level of dilation, the gallbladder was evacuated with suction.The gallbladder fossa was irrigated and hemostasis was applied with cautery. The gallbladder was removed via the 68mm trocar. The fascial defect was closed with a figure-of-eight 0 vicryl suture. Pneumoperitoneum was removed, all trocars were removed. All incisions were closed with 4-0 monocryl subcuticular stitch and Dermabond. The patient woke from anesthesia and was brought to PACU in stable condition. All counts were correct  Findings: acutely inflamed, enlarged, and necrotic gallbladder  Specimen: gallbladder  Blood loss: minimal  Local anesthesia: 20 ml Marcaine with Epinephrine  Complications: none  PLAN OF CARE: Admit for overnight observation  PATIENT DISPOSITION:  PACU - hemodynamically stable.  [facility], [place]

## 2021-01-25 NOTE — Progress Notes (Signed)
  Date: 01/25/2021  Patient name: Martha Hall  Medical record number: [id number]  Date of birth: [birth year]   I have seen and evaluated Kwanza Robinette Haines and discussed their care with the Residency Team.  In brief, patient is a 25 year old female with a past medical history of hydrocephalus status post shunt placement of birth, GERD, alpha thalassemia trait and gestational diabetes who presented to the ED with abdominal pain over the last 2 to 3 weeks.  Patient states that approximately 2 to 3 weeks ago she developed abdominal pain with diarrhea which initially resolved.  She states that her son was sick prior to her getting sick and she thought this is a virus she picked up from her son.  Patient states that approximately 2 to 3 days after this [place] and [place] sore [place].  Then she had recurrent abdominal pain which is mainly epigastric with radiation to her bilateral right and left upper quadrants as well as her back.  It was squeezing in nature.  Patient was unable to get improvement with ibuprofen for this.  She also had associated episodes of vomiting over the last week and worsening abdominal pain over the last week.  She has been able to tolerate liquids but not solids.  She went to urgent care for evaluation on September 16 was diagnosed with UTI and started on Keflex.  Patient had no improvement in her symptoms and came to the ED for further evaluation.  No fevers or chills, no chest pain, no shortness of breath, no palpitations, no lightheadedness, no syncope, no focal weakness, no tingling or numbness, no blurry vision, no diaphoresis.  Today, patient states that her abdominal pain has improved and she denies any pain currently.  PMHx, Fam Hx, and/or Soc Hx : As per resident admit note  Vitals:   01/25/21 0934 01/25/21 1155  BP: 139/84 131/87  Pulse: 62 91  Resp: 18 (!) 25  Temp: 98.4 F (36.9 C) 97.9 F (36.6 C)  SpO2: 100% 100%   General: Awake, alert,  oriented x3, NAD CVS: Regular rhythm, normal heart sounds Lungs: CTA bilaterally Abdomen: Soft, nontender, nondistended, normoactive bowel sounds, negative Murphy sign Extremities: No edema noted, nontender to palpation HEENT: Normocephalic, atraumatic Psych: Normal affect Skin: Warm and dry  Assessment and Plan: I have seen and evaluated the patient as outlined above. I agree with the formulated Assessment and Plan as detailed in the residents' note, with the following changes:   1.  Acute cholecystitis: -Patient presented to the ED with persistent abdominal pain, decreased oral intake and nausea and vomiting over the last week and was noted to have cholelithiasis and a dilated common bile duct on imaging as well as elevated LFTs consistent with acute cholecystitis and possible passed gallbladder stone. -Hepatitis panel was negative -Surgery follow-up recommendations appreciated. -Patient is status post laparoscopic cholecystectomy today and was noted to have an acutely inflamed/necrotic gallbladder.  A small stone was milked from the cystic duct.  Cholangiogram was unable to be performed secondary to friable cystic duct -We will continue with pain control -We will monitor CBC closely -No further work-up at this time.  If patient remains stable and her pain has improved she should be stable for DC home tomorrow  Earl Lagos, MD 9/20/202212:15 PM

## 2021-01-25 NOTE — Plan of Care (Signed)

## 2021-01-25 NOTE — Progress Notes (Signed)
HD#0 SUBJECTIVE:  Patient Summary: Martha Hall is a 25 y.o. with a pertinent PMH of of hydrocephalus s/p shunt placement at birth, GERD, alpha thalassemia trait, and gestational diabetes , who presented with abdominal pain and admitted for symptomatic cholelithiasis with plans for laparoscopic cholecystectomy this morning on hospital day #1.Marland Kitchen  Overnight Events: patient states that her abdominal pain has improved overnight.  No further episodes of nausea or vomiting.  Denies dysuria, frequency, and urgency.   OBJECTIVE:  Vital Signs: Vitals:   01/24/21 1803 01/24/21 1803 01/25/21 0002 01/25/21 0421  BP: (!) 109/52 (!) 109/52 130/81 132/71  Pulse: (!) 56 (!) 56 75 62  Resp: '16 16 17 17  ' Temp: 98.7 F (37.1 C) 98.7 F (37.1 C) 98.1 F (36.7 C) 98.1 F (36.7 C)  TempSrc: Oral Oral Oral Oral  SpO2: 100% 100% 100% 100%  Weight:    101.4 kg  Height:       Supplemental O2: Room Air SpO2: 100 %  Filed Weights   01/24/21 0736 01/25/21 0421  Weight: 99 kg 101.4 kg     Intake/Output Summary (Last 24 hours) at 01/25/2021 0602 Last data filed at 01/24/2021 0626 Gross per 24 hour  Intake 1000.68 ml  Output --  Net 1000.68 ml   Net IO Since Admission: 1,000.68 mL [01/25/21 0602]  Physical Exam: General: well-developed, well-nourished HENT: NCAT Eyes: no scleral icterus, conjunctiva clear CV: No murmurs, rubs, or gallops Pulm: CTAB, normal pulmonary effort GI: no tenderness throughout all four quadrants, murphy's sign negative, bowel sounds present, no  MSK: normal muscle and tone, no edema present to legs bilaterally Skin: warm and dry Psych: normal mood and affect  Patient Lines/Drains/Airways Status     Active Line/Drains/Airways     Name Placement date Placement time Site Days   Peripheral IV 01/24/21 22 G Left Antecubital 01/24/21  0513  Antecubital  1            Pertinent Labs: CBC Latest Ref Rng & Units 01/25/2021 01/24/2021 11/16/2019  WBC 4.0 - 10.5 K/uL  4.8 4.8 6.8  Hemoglobin 12.0 - 15.0 g/dL 12.3 12.8 12.1  Hematocrit 36.0 - 46.0 % 37.9 42.2 37.9  Platelets 150 - 400 K/uL 264 277 317    CMP Latest Ref Rng & Units 01/24/2021 01/24/2021 11/16/2019  Glucose 70 - 99 mg/dL - 112(H) 124(H)  BUN 6 - 20 mg/dL - 7 8  Creatinine 0.44 - 1.00 mg/dL - 0.84 0.66  Sodium 135 - 145 mmol/L - 137 135  Potassium 3.5 - 5.1 mmol/L - 4.1 4.0  Chloride 98 - 111 mmol/L - 103 106  CO2 22 - 32 mmol/L - 23 17(L)  Calcium 8.9 - 10.3 mg/dL - 9.0 9.7  Total Protein 6.5 - 8.1 g/dL - 6.8 6.4(L)  Total Bilirubin 0.3 - 1.2 mg/dL 3.0(H) 3.8(H) 0.4  Alkaline Phos 38 - 126 U/L - 158(H) 271(H)  AST 15 - 41 U/L - 142(H) 16  ALT 0 - 44 U/L - 456(H) 14    No results for input(s): GLUCAP in the last 72 hours.   Pertinent Imaging: MRCP 01/24/2021 IMPRESSION: Cholelithiasis with mild gallbladder distension without pericholecystic fluid or edema.   Mild dilation of the common bile duct with tapered appearance at the level of the pancreatic head. No visible filling defect to suggest choledocholithiasis. Findings could be related to recent passage of a biliary calculus or developing stricture. High-resolution MRCP sequences are limited due to respiratory motion. Trending of bilirubin may be  helpful in this instance to determine next steps in management. If there remains clinical concern for biliary obstruction or for acute cholecystitis HIDA scan may be helpful to add specificity.   No acute pancreatic findings.  ASSESSMENT/PLAN:  Assessment: Active Problems:   Abdominal pain  Martha Hall is a 25 y.o. with a pertinent PMH of of hydrocephalus s/p shunt placement at birth, GERD, alpha thalassemia trait, and gestational diabetes , who presented with abdominal pain and admitted for symptomatic cholelithiasis with plans for laparoscopic cholecystectomy this morning on hospital day #1.  Plan: #Symptomatic cholelithiasis #Conjugated hyperbilirubinemia  Patient  remains afebrile with stable WBCs within normal limits.  AST 142-->153/ALT 456-->352/Alk phos 271-->158-->157.  Bilirubin of 3.8-->3.0. Hepatitis panel negative.   MRCP showed mild dilation of the CBD with tapered appearance with no filling defects to suggest choledocholithiasis.  On exam today, no tenderness present in all four quadrants. Negative murphy's sign. General surgery consulted and plan for laparoscopic cholecystectomy and cholangiogram this morning due to presence of symptoms with gallstones present.   -NPO until after cleared by surgery -Consult to dietitian about low-fat diet -follow up on CMP   Best Practice: Diet: NPO IVF: Fluids: LR infusion at 100 mL/ hour pre-op VTE: Rivaroxaban tablet 10 mg Code: Full AB: Cefazolin 2 g once DISPO: Anticipated discharge today or tomorrow to Home pending  surgery .  Signature: Christiana Fuchs, D.O. Internal Medicine Resident, PGY-1 [facility]  Pager: [phone] 6:02 AM, 01/25/2021   Please contact the on call pager after 5 pm and on weekends at [phone].

## 2021-01-25 NOTE — Discharge Instructions (Signed)
[facility]  Please arrive at least 30 min before your appointment to complete your check in paperwork.  If you are unable to arrive 30 min prior to your appointment time we may have to cancel or reschedule you. LAPAROSCOPIC SURGERY: POST OP INSTRUCTIONS Always review your discharge instruction sheet given to you by the facility where your surgery was performed. IF YOU HAVE DISABILITY OR FAMILY LEAVE FORMS, YOU MUST BRING THEM TO THE OFFICE FOR PROCESSING.   DO NOT GIVE THEM TO YOUR DOCTOR.  PAIN CONTROL  First take acetaminophen (Tylenol) AND/or ibuprofen (Advil) to control your pain after surgery.  Follow directions on package.  Taking acetaminophen (Tylenol) and/or ibuprofen (Advil) regularly after surgery will help to control your pain and lower the amount of prescription pain medication you may need.  You should not take more than 4,000 mg (4 grams) of acetaminophen (Tylenol) in 24 hours.  You should not take ibuprofen (Advil), aleve, motrin, naprosyn or other NSAIDS if you have a history of stomach ulcers or chronic kidney disease.  A prescription for pain medication may be given to you upon discharge.  Take your pain medication as prescribed, if you still have uncontrolled pain after taking acetaminophen (Tylenol) or ibuprofen (Advil). Use ice packs to help control pain. If you need a refill on your pain medication, please contact your pharmacy.  They will contact our office to request authorization. Prescriptions will not be filled after 5pm or on week-ends.  HOME MEDICATIONS Take your usually prescribed medications unless otherwise directed.  DIET You should follow a light diet the first few days after arrival home.  Be sure to include lots of fluids daily. Avoid fatty, fried foods.   CONSTIPATION It is common to experience some constipation after surgery and if you are taking pain medication.  Increasing fluid intake and taking a stool softener (such as Colace)  will usually help or prevent this problem from occurring.  A mild laxative (Milk of Magnesia or Miralax) should be taken according to package instructions if there are no bowel movements after 48 hours.  WOUND/INCISION CARE Most patients will experience some swelling and bruising in the area of the incisions.  Ice packs will help.  Swelling and bruising can take several days to resolve.  Unless discharge instructions indicate otherwise, follow guidelines below  STERI-STRIPS - you may remove your outer bandages 48 hours after surgery, and you may shower at that time.  You have steri-strips (small skin tapes) in place directly over the incision.  These strips should be left on the skin for 7-10 days.   DERMABOND/SKIN GLUE - you may shower in 24 hours.  The glue will flake off over the next 2-3 weeks. Any sutures or staples will be removed at the office during your follow-up visit.  ACTIVITIES You may resume regular (light) daily activities beginning the next day--such as daily self-care, walking, climbing stairs--gradually increasing activities as tolerated.  You may have sexual intercourse when it is comfortable.  Refrain from any heavy lifting or straining until approved by your doctor. You may drive when you are no longer taking prescription pain medication, you can comfortably wear a seatbelt, and you can safely maneuver your car and apply brakes.  FOLLOW-UP You should see your doctor in the office for a follow-up appointment approximately 2-3 weeks after your surgery.  You should have been given your post-op/follow-up appointment when your surgery was scheduled.  If you did not receive a post-op/follow-up appointment, make sure   that you call for this appointment within a day or two after you arrive home to insure a convenient appointment time.  OTHER INSTRUCTIONS  WHEN TO CALL YOUR DOCTOR: Fever over 101.0 Inability to urinate Continued bleeding from incision. Increased pain, redness, or  drainage from the incision. Increasing abdominal pain  The clinic staff is available to answer your questions during regular business hours.  Please don't hesitate to call and ask to speak to one of the nurses for clinical concerns.  If you have a medical emergency, go to the nearest emergency room or call 911.  A surgeon from [facility] is always on call at the hospital. [address], [address], [place], [place]  [address] ? [address] [address], [place], [place]   [address] [phone] ? [phone] ? FAX [phone]   

## 2021-01-25 NOTE — Anesthesia Preprocedure Evaluation (Signed)
Anesthesia Evaluation  Patient identified by MRN, date of birth, ID band Patient awake    Reviewed: Allergy & Precautions, NPO status , Patient's Chart, lab work & pertinent test results  Airway Mallampati: II  TM Distance: >3 FB Neck ROM: Full    Dental no notable dental hx. (+) Teeth Intact   Pulmonary neg pulmonary ROS,    Pulmonary exam normal breath sounds clear to auscultation       Cardiovascular negative cardio ROS Normal cardiovascular exam Rhythm:Regular Rate:Normal     Neuro/Psych Hx/o hydrocephalus negative neurological ROS  negative psych ROS   GI/Hepatic Elevated LFT's Symptomatic Cholelithiasis   Endo/Other  diabetes, GestationalMorbid obesityHx/o gestational DM  Renal/GU negative Renal ROS  negative genitourinary   Musculoskeletal negative musculoskeletal ROS (+)   Abdominal (+) + obese,  Abdomen: tender.    Peds  Hematology negative hematology ROS (+)   Anesthesia Other Findings   Reproductive/Obstetrics                             Anesthesia Physical Anesthesia Plan  ASA: 3  Anesthesia Plan: General   Post-op Pain Management:    Induction: Intravenous  PONV Risk Score and Plan: 4 or greater and Scopolamine patch - Pre-op, Treatment may vary due to age or medical condition, Midazolam and Ondansetron  Airway Management Planned: Oral ETT  Additional Equipment:   Intra-op Plan:   Post-operative Plan: Extubation in OR  Informed Consent: I have reviewed the patients History and Physical, chart, labs and discussed the procedure including the risks, benefits and alternatives for the proposed anesthesia with the patient or authorized representative who has indicated his/her understanding and acceptance.     Dental advisory given  Plan Discussed with: CRNA and Anesthesiologist  Anesthesia Plan Comments:         Anesthesia Quick Evaluation

## 2021-01-25 NOTE — Anesthesia Postprocedure Evaluation (Signed)
Anesthesia Post Note  Patient: Martha Hall  Procedure(s) Performed: LAPAROSCOPIC CHOLECYSTECTOMY WITH ATTEMPTED CHOLANGIOGRAM (Abdomen)     Patient location during evaluation: PACU Anesthesia Type: General Level of consciousness: awake and alert and oriented Pain management: pain level controlled Vital Signs Assessment: post-procedure vital signs reviewed and stable Respiratory status: spontaneous breathing, nonlabored ventilation and respiratory function stable Cardiovascular status: blood pressure returned to baseline and stable Postop Assessment: no apparent nausea or vomiting Anesthetic complications: no   No notable events documented.  Last Vitals:  Vitals:   01/25/21 1310 01/25/21 1324  BP: (!) 147/96 (!) 144/97  Pulse: 72 85  Resp: 17 (!) 22  Temp:  36.4 C  SpO2: 100% 99%    Last Pain:  Vitals:   01/25/21 1215  TempSrc:   PainSc: 9                  Riker Collier A.

## 2021-01-25 NOTE — Anesthesia Procedure Notes (Signed)
Procedure Name: Intubation Date/Time: 01/25/2021 10:32 AM Performed by: Jonna Munro, CRNA Pre-anesthesia Checklist: Patient identified, Emergency Drugs available, Suction available, Patient being monitored and Timeout performed Patient Re-evaluated:Patient Re-evaluated prior to induction Oxygen Delivery Method: Circle system utilized Preoxygenation: Pre-oxygenation with 100% oxygen Induction Type: IV induction Ventilation: Mask ventilation without difficulty Laryngoscope Size: Mac and 3 Grade View: Grade I Tube type: Oral Tube size: 7.0 mm Number of attempts: 1 Airway Equipment and Method: Stylet Placement Confirmation: ETT inserted through vocal cords under direct vision, positive ETCO2 and breath sounds checked- equal and bilateral Secured at: 22 cm Tube secured with: Tape Dental Injury: Teeth and Oropharynx as per pre-operative assessment

## 2021-01-25 NOTE — Transfer of Care (Signed)
Immediate Anesthesia Transfer of Care Note  Patient: Martha Hall  Procedure(s) Performed: LAPAROSCOPIC CHOLECYSTECTOMY WITH ATTEMPTED CHOLANGIOGRAM (Abdomen)  Patient Location: PACU  Anesthesia Type:General  Level of Consciousness: awake, alert , oriented and patient cooperative  Airway & Oxygen Therapy: Patient Spontanous Breathing and Patient connected to face mask oxygen  Post-op Assessment: Report given to RN, Post -op Vital signs reviewed and stable and Patient moving all extremities X 4  Post vital signs: Reviewed and stable  Last Vitals:  Vitals Value Taken Time  BP 131/87 01/25/21 1155  Temp    Pulse 87 01/25/21 1157  Resp 21 01/25/21 1157  SpO2 100 % 01/25/21 1157  Vitals shown include unvalidated device data.  Last Pain:  Vitals:   01/25/21 0934  TempSrc: Oral  PainSc:          Complications: No notable events documented.

## 2021-01-26 ENCOUNTER — Inpatient Hospital Stay (HOSPITAL_COMMUNITY): Payer: Medicaid Other

## 2021-01-26 ENCOUNTER — Encounter (HOSPITAL_COMMUNITY): Payer: Self-pay | Admitting: Surgery

## 2021-01-26 DIAGNOSIS — R7401 Elevation of levels of liver transaminase levels: Secondary | ICD-10-CM

## 2021-01-26 DIAGNOSIS — R109 Unspecified abdominal pain: Secondary | ICD-10-CM | POA: Diagnosis not present

## 2021-01-26 DIAGNOSIS — N179 Acute kidney failure, unspecified: Secondary | ICD-10-CM

## 2021-01-26 LAB — COMPREHENSIVE METABOLIC PANEL
ALT: 334 U/L — ABNORMAL HIGH (ref 0–44)
AST: 192 U/L — ABNORMAL HIGH (ref 15–41)
Albumin: 2.9 g/dL — ABNORMAL LOW (ref 3.5–5.0)
Alkaline Phosphatase: 158 U/L — ABNORMAL HIGH (ref 38–126)
Anion gap: 12 (ref 5–15)
BUN: 12 mg/dL (ref 6–20)
CO2: 21 mmol/L — ABNORMAL LOW (ref 22–32)
Calcium: 8.8 mg/dL — ABNORMAL LOW (ref 8.9–10.3)
Chloride: 102 mmol/L (ref 98–111)
Creatinine, Ser: 1.33 mg/dL — ABNORMAL HIGH (ref 0.44–1.00)
GFR, Estimated: 58 mL/min — ABNORMAL LOW (ref >60)
Glucose, Bld: 153 mg/dL — ABNORMAL HIGH (ref 70–99)
Potassium: 4.5 mmol/L (ref 3.5–5.1)
Sodium: 135 mmol/L (ref 135–145)
Total Bilirubin: 4.6 mg/dL — ABNORMAL HIGH (ref 0.3–1.2)
Total Protein: 5.9 g/dL — ABNORMAL LOW (ref 6.5–8.1)

## 2021-01-26 LAB — SURGICAL PATHOLOGY

## 2021-01-26 LAB — CBC
HCT: 25.9 % — ABNORMAL LOW (ref 36.0–46.0)
HCT: 20.2 % — ABNORMAL LOW (ref 36.0–46.0)
Hemoglobin: 8.4 g/dL — ABNORMAL LOW (ref 12.0–15.0)
Hemoglobin: 6.7 g/dL — CL (ref 12.0–15.0)
MCH: 27 pg (ref 26.0–34.0)
MCH: 27.6 pg (ref 26.0–34.0)
MCHC: 32.4 g/dL (ref 30.0–36.0)
MCHC: 33.2 g/dL (ref 30.0–36.0)
MCV: 83.3 fL (ref 80.0–100.0)
MCV: 83.1 fL (ref 80.0–100.0)
Platelets: 332 10*3/uL (ref 150–400)
Platelets: 259 10*3/uL (ref 150–400)
RBC: 3.11 MIL/uL — ABNORMAL LOW (ref 3.87–5.11)
RBC: 2.43 MIL/uL — ABNORMAL LOW (ref 3.87–5.11)
RDW: 14.5 % (ref 11.5–15.5)
RDW: 14.5 % (ref 11.5–15.5)
WBC: 13.2 10*3/uL — ABNORMAL HIGH (ref 4.0–10.5)
WBC: 7.3 10*3/uL (ref 4.0–10.5)
nRBC: 0 % (ref 0.0–0.2)
nRBC: 0 % (ref 0.0–0.2)

## 2021-01-26 LAB — PREPARE RBC (CROSSMATCH)

## 2021-01-26 LAB — DIC (DISSEMINATED INTRAVASCULAR COAGULATION)PANEL
D-Dimer, Quant: 3.3 ug/mL-FEU — ABNORMAL HIGH (ref 0.00–0.50)
Fibrinogen: 376 mg/dL (ref 210–475)
INR: 1.1 (ref 0.8–1.2)
Platelets: 283 10*3/uL (ref 150–400)
Prothrombin Time: 14 seconds (ref 11.4–15.2)
Smear Review: NONE SEEN
aPTT: 23 seconds — ABNORMAL LOW (ref 24–36)

## 2021-01-26 IMAGING — CT CTA GI BLEED
2 of 16 series · 10 of 46 positions shown, 14 images · IV contrast (APPLIED)
Comparison: Abdomen ultrasound [DATE].
COMPARISON: Abdomen ultrasound [DATE].

Addendum:
CLINICAL DATA: 25-year-old female with diffuse abdominal pain
postoperative day 1 laparoscopic cholecystectomy. Gangrenous
gallbladder.

EXAM:
CTA ABDOMEN AND PELVIS WITHOUT AND WITH CONTRAST
TECHNIQUE: Multidetector CT imaging of the abdomen and pelvis was performed
using the standard protocol during bolus administration of
intravenous contrast. Multiplanar reconstructed images and MIPs were
obtained and reviewed to evaluate the vascular anatomy.
CONTRAST:  100mL OMNIPAQUE IOHEXOL 300 MG/ML  SOLN

[Series 11: venous thins · axial · portal-venous · 0.84mm/px · z∈[+814,+1223]mm · 9 of 1252 slices shown, 13 images]
[im 114/1252  soft-tissue]
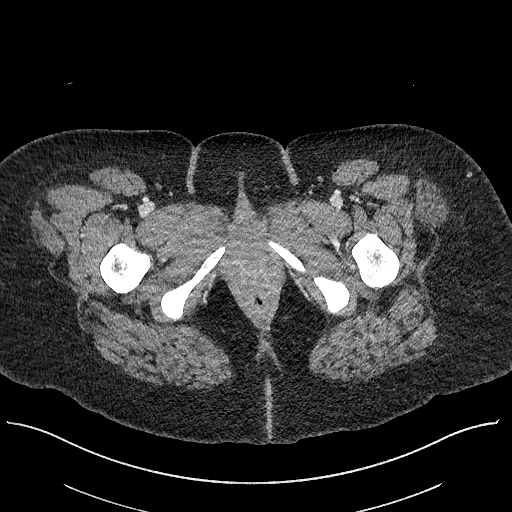
[im 114/1252  bone]
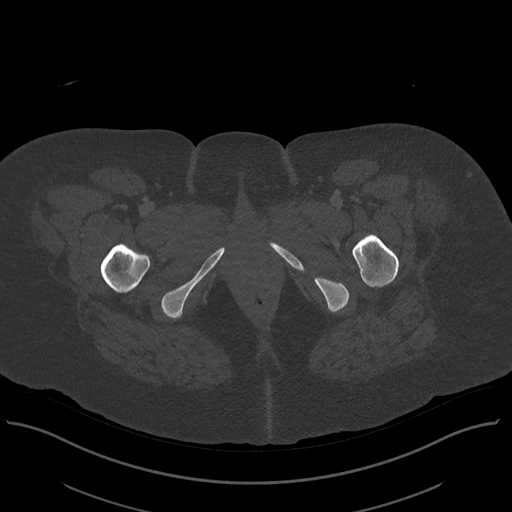
[im 228/1252  soft-tissue]
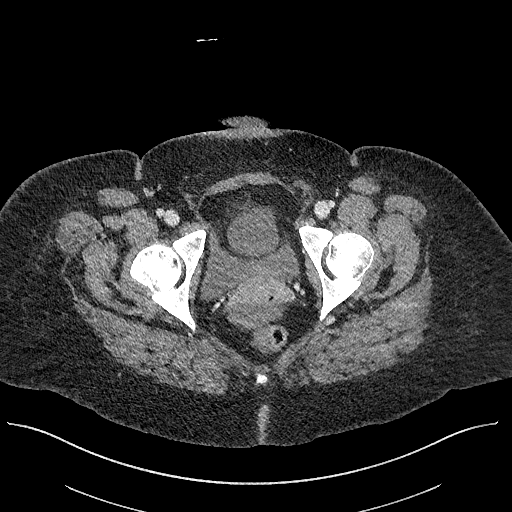
[im 455/1252  soft-tissue]
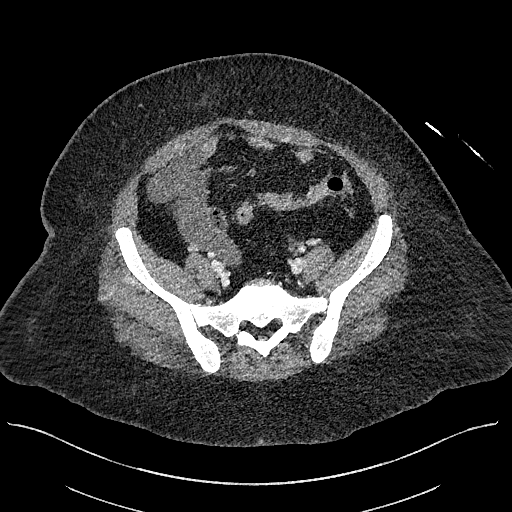
[im 569/1252  soft-tissue]
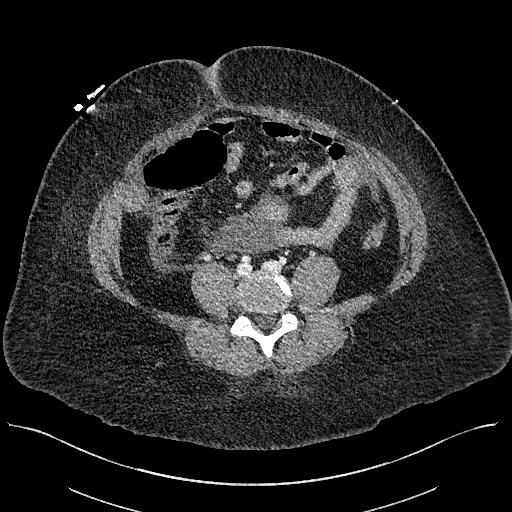
[im 683/1252  soft-tissue]
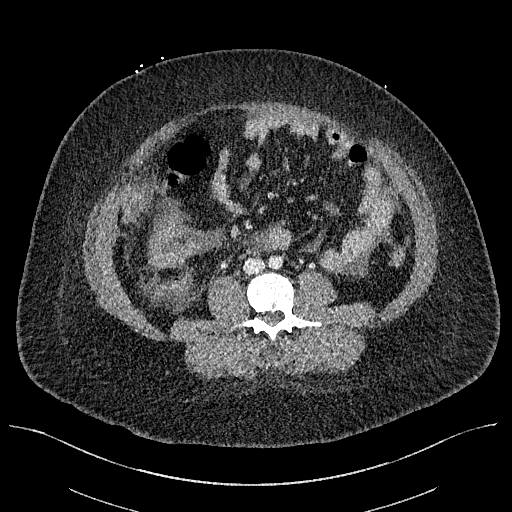
[im 797/1252  soft-tissue]
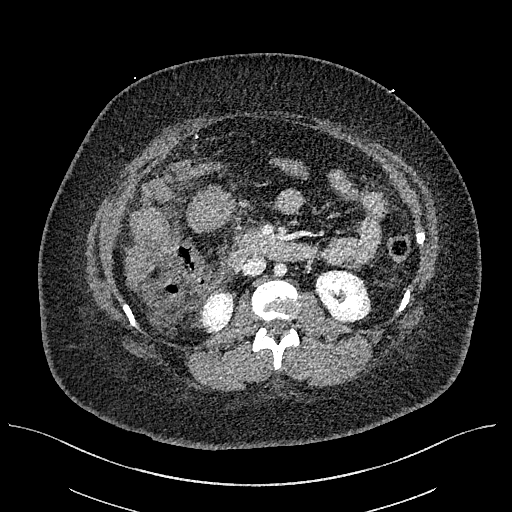
[im 797/1252  lung]
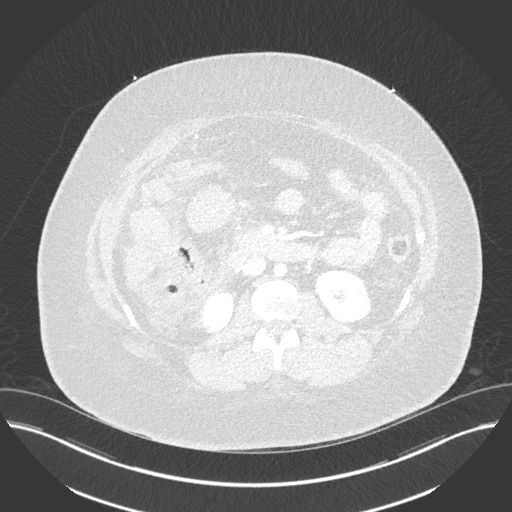
[im 910/1252  lung]
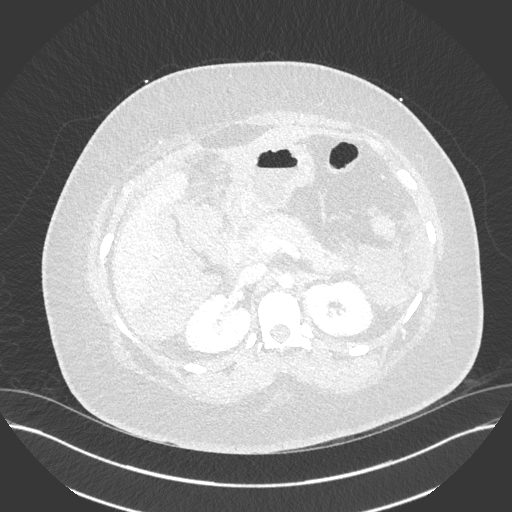
[im 1024/1252  soft-tissue]
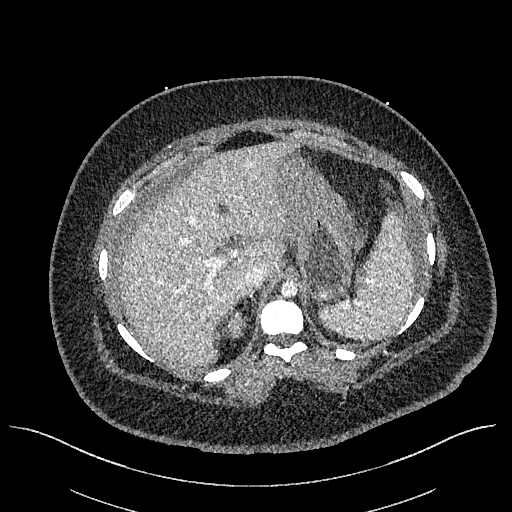
[im 1024/1252  lung]
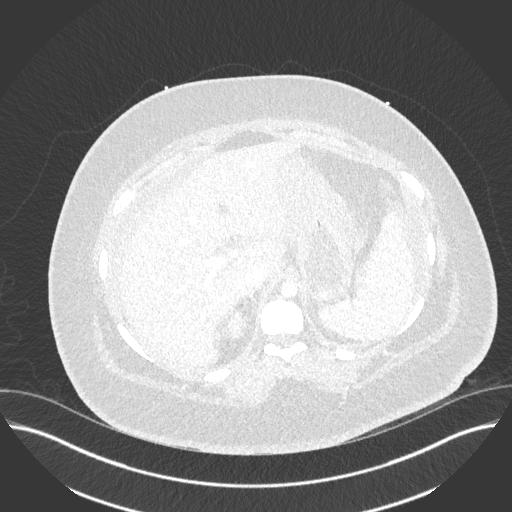
[im 1138/1252  soft-tissue]
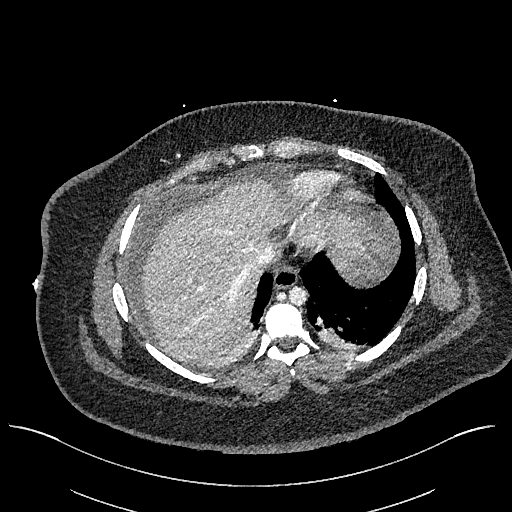
[im 1138/1252  lung]
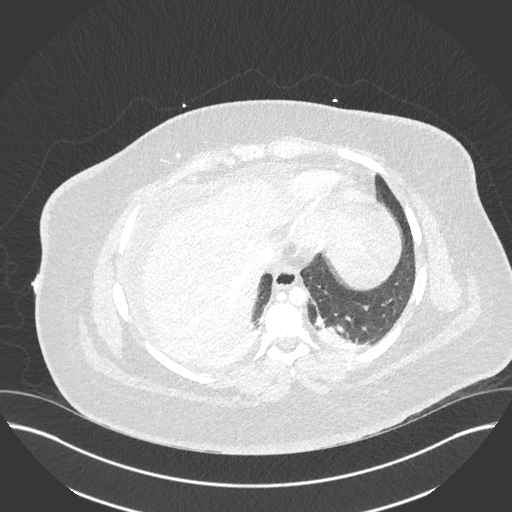

[Series 18: cor · coronal · 0.73mm/px · 1 of 151 slices shown]
[im 76/151  soft-tissue]
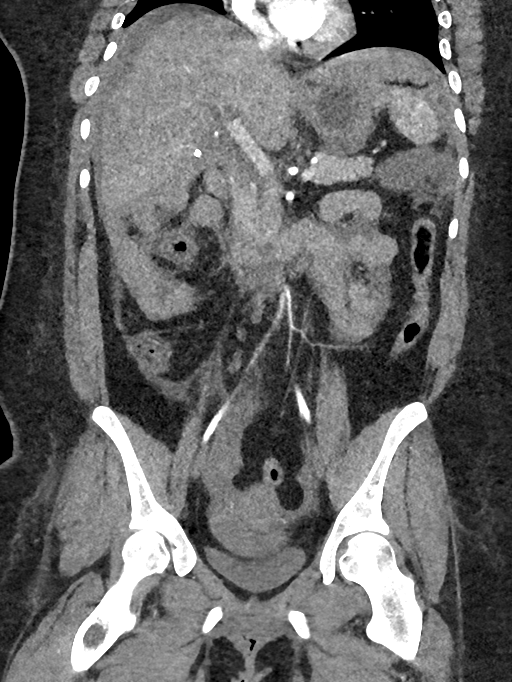

[10 of 46 positions shown; findings below may reference images not displayed]

FINDINGS: VASCULAR

Abdominal aorta and the major arterial branches in the abdomen and
pelvis are patent and within normal limits. No atherosclerosis.
Pelvic and proximal femoral arteries appear patent and within normal
limits.

On the venous phase images the portal venous system is patent. The
hepatic veins, IVC, and central pelvic veins also appear to be
patent.

Review of the MIP images confirms the above findings.

NON-VASCULAR

Lower chest: Confluent but enhancing bilateral lower lobe
atelectasis. Trace bilateral pleural effusion. No pericardial
effusion.

Hepatobiliary: Gallbladder shaped hematoma within the gallbladder
fossa and additional globular, confluent hematoma caudal to the
liver edge (series 6, image 74). Similar moderate volume of mixed
density perihepatic free fluid including between the liver and the
diaphragm. Cholecystectomy clips in the gallbladder fossa, and along
the posterior liver edge. Liver enhancement is within normal
limits. No convincing contrast extravasation on the arterial or
portal venous phase images.

Pancreas: Negative.

Spleen: Negative aside from perisplenic fluid.

Adrenals/Urinary Tract: Normal adrenal glands. Symmetric renal
enhancement. Nonobstructed kidneys. Decompressed ureters. Diminutive
and unremarkable bladder.

Stomach/Bowel: Decompressed large bowel from the ascending colon
distally. Gas-filled cecum is located anteriorly and may beyond a
lax mesentery. Appendix remains normal on series 17, image 57.
Decompressed terminal ileum. No dilated small bowel. Decompressed
stomach and duodenum.

No free air. However, there is widely scattered free fluid in the
bilateral abdomen including the small bowel mesentery and both
gutters, with complex fluid density.

There are mild postoperative changes to the ventral abdominal wall
including the umbilicus with a small volume of abdominal wall gas
(including along the undersurface of the right rectus muscle on
series 6, image 158. No abdominal wall fluid collection.

Lymphatic: No lymphadenopathy.

Reproductive: Negative.

Other: Moderate volume of layering fluid in the pelvis and lower
peritoneal cavity with complex fluid density.

Musculoskeletal: No acute osseous abnormality identified. Probable
benign right femoral head bone island.
IMPRESSION: 1. Positive for a moderate volume of Hemoperitoneum throughout the
abdomen and pelvis, including a gallbladder shaped hematoma within
the gallbladder fossa. No active contrast extravasation identified
to indicate the source of bleeding.

2. Small layering pleural effusions with bilateral lung base
atelectasis.

3. Postoperative changes to the ventral abdominal wall with no
adverse features.

4. No bowel obstruction. Normal appendix.

ADDENDUM:
Study discussed by telephone with Dr. HWARI on [DATE] at

*** End of Addendum ***
FINDINGS: VASCULAR

Abdominal aorta and the major arterial branches in the abdomen and
pelvis are patent and within normal limits. No atherosclerosis.
Pelvic and proximal femoral arteries appear patent and within normal
limits.

On the venous phase images the portal venous system is patent. The
hepatic veins, IVC, and central pelvic veins also appear to be
patent.

Review of the MIP images confirms the above findings.

NON-VASCULAR

Lower chest: Confluent but enhancing bilateral lower lobe
atelectasis. Trace bilateral pleural effusion. No pericardial
effusion.

Hepatobiliary: Gallbladder shaped hematoma within the gallbladder
fossa and additional globular, confluent hematoma caudal to the
liver edge (series 6, image 74). Similar moderate volume of mixed
density perihepatic free fluid including between the liver and the
diaphragm. Cholecystectomy clips in the gallbladder fossa, and along
the posterior liver edge. Liver enhancement is within normal
limits. No convincing contrast extravasation on the arterial or
portal venous phase images.

Pancreas: Negative.

Spleen: Negative aside from perisplenic fluid.

Adrenals/Urinary Tract: Normal adrenal glands. Symmetric renal
enhancement. Nonobstructed kidneys. Decompressed ureters. Diminutive
and unremarkable bladder.

Stomach/Bowel: Decompressed large bowel from the ascending colon
distally. Gas-filled cecum is located anteriorly and may beyond a
lax mesentery. Appendix remains normal on series 17, image 57.
Decompressed terminal ileum. No dilated small bowel. Decompressed
stomach and duodenum.

No free air. However, there is widely scattered free fluid in the
bilateral abdomen including the small bowel mesentery and both
gutters, with complex fluid density.

There are mild postoperative changes to the ventral abdominal wall
including the umbilicus with a small volume of abdominal wall gas
(including along the undersurface of the right rectus muscle on
series 6, image 158. No abdominal wall fluid collection.

Lymphatic: No lymphadenopathy.

Reproductive: Negative.

Other: Moderate volume of layering fluid in the pelvis and lower
peritoneal cavity with complex fluid density.

Musculoskeletal: No acute osseous abnormality identified. Probable
benign right femoral head bone island.
IMPRESSION: 1. Positive for a moderate volume of Hemoperitoneum throughout the
abdomen and pelvis, including a gallbladder shaped hematoma within
the gallbladder fossa. No active contrast extravasation identified
to indicate the source of bleeding.

2. Small layering pleural effusions with bilateral lung base
atelectasis.

3. Postoperative changes to the ventral abdominal wall with no
adverse features.

4. No bowel obstruction. Normal appendix.

## 2021-01-26 MED ORDER — IOHEXOL 300 MG/ML  SOLN
100.0000 mL | Freq: Once | INTRAMUSCULAR | Status: AC | PRN
  Administered 2021-01-26: 100 mL via INTRAVENOUS

## 2021-01-26 MED ORDER — SODIUM CHLORIDE 0.9 % IV SOLN: INTRAVENOUS | Status: AC

## 2021-01-26 MED ORDER — OXYCODONE HCL 5 MG PO TABS
5.0000 mg | ORAL_TABLET | ORAL | Status: DC | PRN
  Administered 2021-01-27 – 2021-01-28 (×2): 5 mg via ORAL
  Administered 2021-01-28: 10 mg via ORAL
  Filled 2021-01-26: qty 2
  Filled 2021-01-26 (×2): qty 1

## 2021-01-26 MED ORDER — SODIUM CHLORIDE 0.9 % IV BOLUS: 500.0000 mL | Freq: Once | INTRAVENOUS | Status: DC

## 2021-01-26 MED ORDER — LACTATED RINGERS IV BOLUS
1000.0000 mL | Freq: Once | INTRAVENOUS | Status: AC
  Administered 2021-01-26: 1000 mL via INTRAVENOUS

## 2021-01-26 MED ORDER — SODIUM CHLORIDE 0.9 % IV BOLUS: 1000.0000 mL | Freq: Once | INTRAVENOUS | Status: DC

## 2021-01-26 MED ORDER — SODIUM CHLORIDE 0.9% IV SOLUTION
Freq: Once | INTRAVENOUS | Status: AC
  Administered 2021-01-26: 40 mL via INTRAVENOUS

## 2021-01-26 MED ORDER — METHOCARBAMOL 500 MG PO TABS
500.0000 mg | ORAL_TABLET | Freq: Three times a day (TID) | ORAL | Status: DC | PRN
  Administered 2021-01-30 – 2021-01-31 (×2): 500 mg via ORAL
  Filled 2021-01-26 (×2): qty 1

## 2021-01-26 MED ORDER — PIPERACILLIN-TAZOBACTAM 3.375 G IVPB
3.3750 g | Freq: Three times a day (TID) | INTRAVENOUS | Status: DC
  Administered 2021-01-26 – 2021-01-28 (×5): 3.375 g via INTRAVENOUS
  Filled 2021-01-26 (×6): qty 50

## 2021-01-26 MED ORDER — ACETAMINOPHEN 500 MG PO TABS
1000.0000 mg | ORAL_TABLET | Freq: Four times a day (QID) | ORAL | Status: DC | PRN
  Administered 2021-01-30 – 2021-01-31 (×2): 1000 mg via ORAL
  Filled 2021-01-26 (×2): qty 2

## 2021-01-26 NOTE — Progress Notes (Signed)
HD#1 SUBJECTIVE:  Patient Summary: Martha Hall is a 25 y.o. with a pertinent PMH of hydrocephalus s/p shunt placement at birth, GERD, alpha thalassemia trait, and gestational diabetes, who presented with abdominal pain and admitted for acute cholecystitis.   Overnight Events: Patient was hypotensive overnight and stated that she felt light headed when she sat up.    OBJECTIVE:  Vital Signs: Vitals:   01/26/21 0811 01/26/21 1010 01/26/21 1011 01/26/21 1014  BP: 110/79     Pulse: (!) 132 (!) 124 (!) 143 (!) 152  Resp: 20     Temp: 98 F (36.7 C)     TempSrc: Oral     SpO2: 100%     Weight:      Height:       Supplemental O2: Room Air SpO2: 100 % O2 Flow Rate (L/min): 2 L/min  Filed Weights   01/24/21 0736 01/25/21 0421 01/25/21 0934  Weight: 99 kg 101.4 kg 101.2 kg     Intake/Output Summary (Last 24 hours) at 01/26/2021 1106 Last data filed at 01/26/2021 2229 Gross per 24 hour  Intake 2963 ml  Output 440 ml  Net 2523 ml   Net IO Since Admission: 3,523.68 mL [01/26/21 1106]  Physical Exam: General: well-developed, well-nourished, obese HENT: NCAT Eyes: no scleral icterus, conjunctiva clear CV: tachycardic, no rubs, or gallops Pulm: CTAB, normal pulmonary effort GI: mild tenderness in right upper quadrant, no abdominal distension MSK: no edema in legs bilaterally,   Skin: warm and dry, no ecchymosis noted on abdomen, 3 laparoscopic incisions clean and covered in dermabond Psych: normal mood and affect   Patient Lines/Drains/Airways Status     Active Line/Drains/Airways     Name Placement date Placement time Site Days   Peripheral IV 01/24/21 22 G Left Antecubital 01/24/21  0513  Antecubital  2   Peripheral IV 01/25/21 20 G Anterior;Left Wrist 01/25/21  --  Wrist  1   Incision (Closed) 01/25/21 Abdomen Other (Comment) 01/25/21  1054  -- 1   Incision - 4 Ports Abdomen 1: Umbilicus 2: Mid;Upper 3: Right;Medial 4: Right;Lateral 01/25/21  1049  -- 1             Pertinent Labs: CBC Latest Ref Rng & Units 01/26/2021 01/25/2021 01/25/2021  WBC 4.0 - 10.5 K/uL 13.2(H) 11.6(H) 4.8  Hemoglobin 12.0 - 15.0 g/dL 8.4(L) 11.1(L) 12.3  Hematocrit 36.0 - 46.0 % 25.9(L) 34.7(L) 37.9  Platelets 150 - 400 K/uL 332 411(H) 264    CMP Latest Ref Rng & Units 01/26/2021 01/25/2021 01/24/2021  Glucose 70 - 99 mg/dL 153(H) 100(H) -  BUN 6 - 20 mg/dL 12 <5(L) -  Creatinine 0.44 - 1.00 mg/dL 1.33(H) 0.83 -  Sodium 135 - 145 mmol/L 135 138 -  Potassium 3.5 - 5.1 mmol/L 4.5 3.9 -  Chloride 98 - 111 mmol/L 102 104 -  CO2 22 - 32 mmol/L 21(L) 25 -  Calcium 8.9 - 10.3 mg/dL 8.8(L) 9.2 -  Total Protein 6.5 - 8.1 g/dL 5.9(L) 6.4(L) -  Total Bilirubin 0.3 - 1.2 mg/dL 4.6(H) 3.6(H) 3.0(H)  Alkaline Phos 38 - 126 U/L 158(H) 157(H) -  AST 15 - 41 U/L 192(H) 153(H) -  ALT 0 - 44 U/L 334(H) 352(H) -    No results for input(s): GLUCAP in the last 72 hours.   Pertinent Imaging: DG Cholangiogram Operative  Result Date: 01/25/2021 CLINICAL DATA:  Intraoperative cholangiogram during laparoscopic cholecystectomy. EXAM: INTRAOPERATIVE CHOLANGIOGRAM FLUOROSCOPY TIME:  14 seconds (8.0 mGy) COMPARISON:  Abdominal MRI-01/24/2021 FINDINGS: Intraoperative cholangiographic images of the right upper abdominal quadrant during laparoscopic cholecystectomy are provided for review. There is extravasation of contrast about the attempted cystic duct cannulation site without definitive opacification of either the cystic duct or intrahepatic biliary tree. There is partial opacification of the gallbladder with apparent filling defect at the level of the gallbladder fundus suggestive of cholelithiasis. IMPRESSION: Nondiagnostic intraoperative cholangiogram. Further evaluation with ERCP could be performed as indicated. Electronically Signed   By: Sandi Mariscal M.D.   On: 01/25/2021 12:12    ASSESSMENT/PLAN:  Assessment: Active Problems:   Abdominal pain   Cholelithiasis  Martha Hall is a 25  y.o. with a pertinent PMH of hydrocephalus s/p shunt placement at birth, GERD, alpha thalassemia trait, and gestational diabetes, who presented with abdominal pain and admitted for acute cholecystitis on hospital day 1.    Plan: #Blood loss anemia s/p laparoscopic cholecystectomy Patient underwent laparoscopic cholecystectomy 09/20.  MRCP done 09/19 showed cholelithiasis, mild GB distension without obvious cholecystitis. However, during surgery, gallbladder was found to be dilated, gangrenous, and inflamed with minor adherence to omentum. Recheck Hgb that evening was 12.3-->8.4.  Overnight patient was hypotensive, as low as 85/47.  This morning Hgb has continued to drop from 8.4-->6.7.  CT Abdomen showed hemoperitoneum throughout the abdomen and pelvis, with no active contrast extravasation identified to indicate the source of bleeding. Patient was given two units of RBCs.  Surgical team do not plan to take patient for further intervention due to the CT not showing active extravasation of blood.  Will repeat CBC this evening.   -Follow CBC -Surgery following -Zosyn  -fluid resuscitation  #Transaminitis GI consulted due to trend of bilirubin from 3.8--> 4.6. AST/ ALT/ Alk phos at 192/334/158.  Albumin of 2.9.  During laparoscopic cholecystectomy, gallbladder was seen to be gangrenous and a stone was milked from the cystic duct. Intraoperative cholangiogram was unsuccessful.  Elevated bilirubin could be secondary to gallstone remaining in hepatic duct.   -GI following  #Acute Kidney Injury Creatinine increased from 0.83-->1.33.  In the setting of hemoperitoneum, this is likely pre-renal due to blood loss.  -Monitor BMP -LR bolus of 1L with 167m/hr of NS following bolus  Best Practice: Diet: NPO IVF: Fluids: 0.9NS, Rate:  100 cc/hr x 1 hrs VTE: last xarelto given 09/19 for one dose Code: Full AB: Zosyn Therapy Recs:  Family Contact: grandmother updated at bedside DISPO: Anticipated discharge  pending Medical stability.  Signature: KChristiana Fuchs D.O. Internal Medicine Resident, PGY-1 [facility]  Pager: #[phone] 322711:06 AM, 01/26/2021   Please contact the on call pager after 5 pm and on weekends at [phone]

## 2021-01-26 NOTE — Consult Note (Addendum)
[facility] Consult: 10:13 AM 01/26/2021  LOS: 1 day    Referring Provider: Dr Nedra Hai  Primary Care Physician:  Patient, No Pcp Per (Inactive) Primary Gastroenterologist:  unassigned     Reason for Consultation:  rising LFTs.  S/p lap chole.     HPI: Martha Hall is a 25 y.o. female.  PMH outlined below.  Pregnancy associated reflux last year.  Alk phos elevation to 271 11/2019 during pregnancy.    Went to urgent care on 9/16 with about a week of upper abdominal pain, nonbloody emesis, nausea..  No blood work obtained, no imaging studies.  Urine culture grew greater than 100K Bacillus species.  She was prescribed cephalexin, bid famotidine, esomeprazole 20 mg daily.    Symptoms did not improve and came back to the ED 3 days later.  Took a couple of ibuprofen without improvement.  Generally does not use a lot of NSAIDs. Lab work-up revealed T bili 3.8.  Alk phos 158.  AST/ALT 142/456.  Lipase 31.  Acute hepatitis serologies, HIV all nonreactive. Ultrasound 9/19 numerous gallstones without obvious cholecystitis.  CBD 5 mm MRCP/MRI abdomen 9/19.  Cholelithiasis, mild GB distention without obvious cholecystitis.  CBD up to 7 mm above pancreatic head and tapering/narrowing above the expected level of ampulla.  No CBD stone.  Underwent laparoscopic cholecystectomy yesterday. GB was dilated, gangrenous, inflamed.  Minor adherence to omentum..  A small stone was milked from the cystic duct.  IOC unsuccessful and aborted due to cystic duct friability.  Cystic duct triple clipped, cystic artery double clipped, ligated.  LFTs have been persistently elevated.  Today T bili 4.6, alk phos 158.  AST/ALT 192/334.  Other lab abnormalities include AKI noted for the first time today, creatinine previously normal now  1.33. Additional acute lab abnormality today is Hgb 8.4, previously 12.8 -11.1.  WBCs now 13.2, 4.8 at arrival. Patient's had persistent tachycardia beginning yesterday at 1530.  Heart rate today has reached as high as 160. Teaching service has ordered a CT abdomen pelvis with angio  Clinically, abdominal pain improved but still pretty intense.  Initial pain located in upper abdomen/epigastric area with some radiation to the back.  Current, postoperative pain is more diffuse.  Since surgery she is tolerating solid food.  Had a brown stool yesterday before surgery.  No nausea or vomiting.  Patient drinks a glass or 2 of wine on occasion but no more than a couple of times a month if that.    Past Medical History:  Diagnosis Date   GDM (gestational diabetes mellitus) 09/2019   Normal 2 hr GTT   Hydrocephalus (HCC)    hx of   Infection    UTI    Past Surgical History:  Procedure Laterality Date   BRAIN SURGERY  February 26, 1996   stint placed when born   CHOLECYSTECTOMY N/A 01/25/2021   Procedure: LAPAROSCOPIC CHOLECYSTECTOMY WITH ATTEMPTED CHOLANGIOGRAM;  Surgeon: Coralie Keens, MD;  Location: [facility];  Service: General;  Laterality: N/A;    Prior to Admission medications   Medication Sig Start Date  End Date Taking? Authorizing Provider  acetaminophen (TYLENOL) 500 MG tablet Take 1,000 mg by mouth every 6 (six) hours as needed for moderate pain or headache.   Yes [provider]  cephALEXin (KEFLEX) 500 MG capsule Take 1 capsule (500 mg total) by mouth 2 (two) times daily. Patient taking differently: Take 500 mg by mouth See admin instructions. Bid x 5 days 01/21/21  Yes Jaynee Eagles, PA-C  esomeprazole (NEXIUM) 20 MG capsule Take 1 capsule (20 mg total) by mouth daily before breakfast. 01/21/21  Yes Jaynee Eagles, PA-C  famotidine (PEPCID) 20 MG tablet Take 1 tablet (20 mg total) by mouth 2 (two) times daily. 01/21/21  Yes Jaynee Eagles, PA-C  ibuprofen (ADVIL) 200 MG tablet Take 600 mg by  mouth every 6 (six) hours as needed for mild pain or headache.   Yes [provider]  meclizine (ANTIVERT) 25 MG tablet Take 1 tablet (25 mg total) by mouth 3 (three) times daily as needed for dizziness. 06/21/20  Yes Melynda Ripple, MD  Phenazopyridine HCl (AZO URINARY PAIN RELIEF PO) Take 1 tablet by mouth every 4 (four) hours as needed (urinary pain).   Yes [provider]  prochlorperazine (COMPAZINE) 10 MG tablet Take 1 tablet (10 mg total) by mouth every 8 (eight) hours as needed for nausea or vomiting. 06/21/20  Yes Melynda Ripple, MD  tiZANidine (ZANAFLEX) 4 MG tablet Take 1 tablet (4 mg total) by mouth every 8 (eight) hours as needed for muscle spasms. 12/19/20  Yes Hazel Sams, PA-C  ibuprofen (ADVIL) 800 MG tablet Take 1 tablet (800 mg total) by mouth 3 (three) times daily. Patient not taking: Reported on 01/24/2021 12/19/20   Hazel Sams, PA-C  medroxyPROGESTERone (DEPO-PROVERA) 150 MG/ML injection Inject 1 mL (150 mg total) into the muscle every 3 (three) months. Patient not taking: Reported on 01/24/2021 12/30/19   Cephas Darby, MD  Prenatal Vit-Fe Fumarate-FA (PRENATAL VITAMIN) 27-0.8 MG TABS Take 1 tablet by mouth daily. Patient not taking: Reported on 01/24/2021 07/16/19   Chauncey Mann, MD  diphenhydrAMINE (BENADRYL) 25 MG tablet Take 12.5 mg by mouth every 6 (six) hours as needed for allergies. Patient not taking: Reported on 12/30/2019  06/21/20  [provider]    Scheduled Meds:  famotidine  20 mg Oral BID   pantoprazole  40 mg Oral Daily   scopolamine  1 patch Transdermal Q72H   sodium chloride flush  3 mL Intravenous Q12H   Infusions:  sodium chloride     lactated ringers     piperacillin-tazobactam (ZOSYN)  IV     PRN Meds: acetaminophen, HYDROmorphone (DILAUDID) injection, methocarbamol, ondansetron **OR** ondansetron (ZOFRAN) IV, oxyCODONE   Allergies as of 01/24/2021 - Review Complete 01/24/2021  Allergen Reaction Noted    Metoclopramide Itching 11/16/2019    Family History  Problem Relation Age of Onset   Healthy Mother    Healthy Father     Social History   Tobacco Use   Smoking status: Never   Smokeless tobacco: Never  Vaping Use   Vaping Use: Never used  Substance Use Topics   Alcohol use: Never   Drug use: Never      REVIEW OF SYSTEMS: Constitutional: No issues with weakness or fatigue. ENT:  No nose bleeds Pulm: No shortness of breath, no cough. CV:  No palpitations, no LE edema.  No angina. GU:  No hematuria, dysuria.  Is urinating a lot. GI: See HPI. Heme: No unusual bleeding or bruising. Transfusions:  None. Neuro:  No headaches, no peripheral tingling or numbness no syncope, no seizures. Derm:  No itching, no rash or sores.  Endocrine:  No sweats or chills.  No polyuria or dysuria Immunization: Pfizer COVID-19 vaccination x 2 in 2021    PHYSICAL EXAM: Vital signs in last 24 hours: Vitals:   01/26/21 0600 01/26/21 0811  BP: 102/71 110/79  Pulse: (!) 117 (!) 132  Resp: (!) 22 20  Temp: 98.3 F (36.8 C) 98 F (36.7 C)  SpO2: 100% 100%   Wt Readings from Last 3 Encounters:  01/25/21 101.2 kg  12/30/19 99.8 kg  11/16/19 110.5 kg    General: Morbidly obese, appears comfortable.  Does not look toxic or acutely ill. Head: No facial asymmetry, no signs of head trauma. Eyes: No scleral icterus, no conjunctival pallor.  EOMI Ears: Not hard of hearing Nose: No congestion or discharge Mouth: Good dentition.  Tongue midline.  Mucosa moist, pink, clear. Neck: No JVD, no masses, no thyromegaly Lungs: Clear bilaterally without labored breathing or cough. Heart: Tacky to 160, regular.  No MRG.  S1, S2 present Abdomen: Soft.  Minor tenderness across the upper and mid abdomen.  Surgical incision sites intact, glued, unremarkable..    Musc/Skeltl: No obvious joint redness, swelling or deformities. Extremities: No CCE. Neurologic: Alert.  Oriented x3.  Moves all 4 limbs  without gross deficit.  Strength not tested.  No tremors. Skin: No rash, no sores, no suspicious lesions. Nodes: No cervical adenopathy Psych: Fluid speech.  Calm.  Cooperative.   LAB RESULTS: Recent Labs    01/25/21 0541 01/25/21 1640 01/26/21 0633  WBC 4.8 11.6* 13.2*  HGB 12.3 11.1* 8.4*  HCT 37.9 34.7* 25.9*  PLT 264 411* 332   BMET Lab Results  Component Value Date   NA 135 01/26/2021   NA 138 01/25/2021   NA 137 01/24/2021   K 4.5 01/26/2021   K 3.9 01/25/2021   K 4.1 01/24/2021   CL 102 01/26/2021   CL 104 01/25/2021   CL 103 01/24/2021   CO2 21 (L) 01/26/2021   CO2 25 01/25/2021   CO2 23 01/24/2021   GLUCOSE 153 (H) 01/26/2021   GLUCOSE 100 (H) 01/25/2021   GLUCOSE 112 (H) 01/24/2021   BUN 12 01/26/2021   BUN <5 (L) 01/25/2021   BUN 7 01/24/2021   CREATININE 1.33 (H) 01/26/2021   CREATININE 0.83 01/25/2021   CREATININE 0.84 01/24/2021   CALCIUM 8.8 (L) 01/26/2021   CALCIUM 9.2 01/25/2021   CALCIUM 9.0 01/24/2021   LFT Recent Labs    01/24/21 0403 01/24/21 0820 01/25/21 0541 01/26/21 0633  PROT 6.8  --  6.4* 5.9*  ALBUMIN 3.4*  --  3.1* 2.9*  AST 142*  --  153* 192*  ALT 456*  --  352* 334*  ALKPHOS 158*  --  157* 158*  BILITOT 3.8* 3.0* 3.6* 4.6*  BILIDIR  --  1.6*  --   --   IBILI  --  1.4*  --   --     Hepatitis Panel Recent Labs    01/24/21 0820  HEPBSAG NON REACTIVE  HCVAB NON REACTIVE  HEPAIGM NON REACTIVE  HEPBIGM NON REACTIVE    Lipase     Component Value Date/Time   LIPASE 31 01/24/2021 0403     RADIOLOGY STUDIES: DG Cholangiogram Operative  Result Date: 01/25/2021 CLINICAL DATA:  Intraoperative cholangiogram during laparoscopic cholecystectomy. EXAM: INTRAOPERATIVE CHOLANGIOGRAM FLUOROSCOPY TIME:  14 seconds (8.0 mGy) COMPARISON:  Abdominal MRI-01/24/2021  FINDINGS: Intraoperative cholangiographic images of the right upper abdominal quadrant during laparoscopic cholecystectomy are provided for review. There is  extravasation of contrast about the attempted cystic duct cannulation site without definitive opacification of either the cystic duct or intrahepatic biliary tree. There is partial opacification of the gallbladder with apparent filling defect at the level of the gallbladder fundus suggestive of cholelithiasis. IMPRESSION: Nondiagnostic intraoperative cholangiogram. Further evaluation with ERCP could be performed as indicated. Electronically Signed   By: Sandi Mariscal M.D.   On: 01/25/2021 12:12   MR ABDOMEN MRCP W WO CONTAST  Result Date: 01/24/2021 CLINICAL DATA:  Right upper quadrant abdominal pain, nondiagnostic ultrasound 22-year-old female with history of prior gestational diabetes. EXAM: MRI ABDOMEN WITHOUT AND WITH CONTRAST (INCLUDING MRCP) TECHNIQUE: Multiplanar multisequence MR imaging of the abdomen was performed both before and after the administration of intravenous contrast. Heavily T2-weighted images of the biliary and pancreatic ducts were obtained, and three-dimensional MRCP images were rendered by post processing. CONTRAST:  8m GADAVIST GADOBUTROL 1 MMOL/ML IV SOLN COMPARISON:  Comparison made with ultrasound evaluation of 01/24/2021. FINDINGS: Lower chest: Incidental imaging of the lung bases on MRI is unremarkable on limited assessment. Hepatobiliary: No focal, suspicious hepatic lesion. No substantial fat or iron deposition in the liver. Liver with smooth contours. The portal vein is patent. No pericholecystic stranding. Mildly distended gallbladder without pericholecystic fluid or edema. Cholelithiasis. Mild distension of the common bile duct up to 7 mm above the pancreatic head with tapering of the common bile duct extending into pancreatic parenchyma, tapered narrowing slightly above the expected level of the ampulla. No visible filling defect in the common bile duct, dedicated MRCP sequences are limited due to respiratory motion. No pancreatic lesion. Pancreas: Normal intrinsic T1 signal.  No ductal dilation or sign of inflammation. No focal lesion. Spleen:  Top-normal size.  No focal lesion. Adrenals/Urinary Tract: Normal appearance of adrenal glands and kidneys, no hydronephrosis or perinephric stranding. No suspicious renal lesion. Stomach/Bowel: Unremarkable to the extent evaluated on abdominal MRI. Vascular/Lymphatic: No pathologically enlarged lymph nodes identified. No abdominal aortic aneurysm demonstrated. Other:  None. Musculoskeletal: No suspicious bone lesions identified. IMPRESSION: Cholelithiasis with mild gallbladder distension without pericholecystic fluid or edema. Mild dilation of the common bile duct with tapered appearance at the level of the pancreatic head. No visible filling defect to suggest choledocholithiasis. Findings could be related to recent passage of a biliary calculus or developing stricture. High-resolution MRCP sequences are limited due to respiratory motion. Trending of bilirubin may be helpful in this instance to determine next steps in management. If there remains clinical concern for biliary obstruction or for acute cholecystitis HIDA scan may be helpful to add specificity. No acute pancreatic findings. Electronically Signed   By: GZetta BillsM.D.   On: 01/24/2021 13:26      IMPRESSION:   Persistent elevation of LFTs.  Based on preoperative imaging, may have passed a bile duct stone and CBD revealed tapering near ampulla.  Unable to perform IOC yesterday.  Rule out recurrent CBD stone.  Note her alkaline phosphatase was elevated/otherwise normal LFTs associated with pregnancy last July, never rechecked LFTs until last few days.  Liver parenchyma normal on Ultrasoun, MRI  Laparoscopic cholecystectomy 01/2019, yesterday.  Gallbladder gangrenous.  Stone milked from cystic duct, unsuccessful IOC due to friable cystic duct    Acute anemia.  Worsening tachycardia.  Rising WBCs.  Received Rivaroxiban DVT prophylaxis once on 9/19.  CTAP w angio ordered.      PLAN:  Await CT findings.  Dr Carlean Purl will see pt.     Azucena Freed  01/26/2021, 10:13 AM Phone [place] Attending   I have taken a history, reviewed the chart and examined the patient. I agree with the Advanced Practitioner's note, impression and recommendations.  Majority the medical decision-making in the formulation of the assessment and plan were performed by me.  CT-A abd/pelvis shows sig post lap chole hematoma   CT ANGIO GI BLEED Addendum: ADDENDUM REPORT: 01/26/2021 12:10   ADDENDUM:  Study discussed by telephone with Dr. Joneen Boers on 01/26/2021 at  12:09 .   Electronically Signed    By: Genevie Ann M.D.    On: 01/26/2021 12:10 Narrative: CLINICAL DATA:  25 year old female with diffuse abdominal pain postoperative day 1 laparoscopic cholecystectomy. Gangrenous gallbladder.  EXAM: CTA ABDOMEN AND PELVIS WITHOUT AND WITH CONTRAST  TECHNIQUE: Multidetector CT imaging of the abdomen and pelvis was performed using the standard protocol during bolus administration of intravenous contrast. Multiplanar reconstructed images and MIPs were obtained and reviewed to evaluate the vascular anatomy.  CONTRAST:  178m OMNIPAQUE IOHEXOL 300 MG/ML  SOLN  COMPARISON:  Abdomen ultrasound 01/24/2021.  FINDINGS: VASCULAR  Abdominal aorta and the major arterial branches in the abdomen and pelvis are patent and within normal limits. No atherosclerosis. Pelvic and proximal femoral arteries appear patent and within normal limits.  On the venous phase images the portal venous system is patent. The hepatic veins, IVC, and central pelvic veins also appear to be patent.  Review of the MIP images confirms the above findings.  NON-VASCULAR  Lower chest: Confluent but enhancing bilateral lower lobe atelectasis. Trace bilateral pleural effusion. No pericardial effusion.  Hepatobiliary: Gallbladder shaped hematoma within the gallbladder fossa and  additional globular, confluent hematoma caudal to the liver edge (series 6, image 74). Similar moderate volume of mixed density perihepatic free fluid including between the liver and the diaphragm. Cholecystectomy clips in the gallbladder fossa, and along the posterior liver edge. Liver enhancement is within normal limits. No convincing contrast extravasation on the arterial or portal venous phase images.  Pancreas: Negative.  Spleen: Negative aside from perisplenic fluid.  Adrenals/Urinary Tract: Normal adrenal glands. Symmetric renal enhancement. Nonobstructed kidneys. Decompressed ureters. Diminutive and unremarkable bladder.  Stomach/Bowel: Decompressed large bowel from the ascending colon distally. Gas-filled cecum is located anteriorly and may beyond a lax mesentery. Appendix remains normal on series 17, image 57. Decompressed terminal ileum. No dilated small bowel. Decompressed stomach and duodenum.  No free air. However, there is widely scattered free fluid in the bilateral abdomen including the small bowel mesentery and both gutters, with complex fluid density.  There are mild postoperative changes to the ventral abdominal wall including the umbilicus with a small volume of abdominal wall gas (including along the undersurface of the right rectus muscle on series 6, image 158. No abdominal wall fluid collection.  Lymphatic: No lymphadenopathy.  Reproductive: Negative.  Other: Moderate volume of layering fluid in the pelvis and lower peritoneal cavity with complex fluid density.  Musculoskeletal: No acute osseous abnormality identified. Probable benign right femoral head bone island.  IMPRESSION: 1. Positive for a moderate volume of Hemoperitoneum throughout the abdomen and pelvis, including a gallbladder shaped hematoma within the gallbladder fossa. No active contrast extravasation identified to indicate the source of bleeding.  2. Small layering pleural  effusions with bilateral lung base atelectasis.  3. Postoperative changes to the ventral abdominal wall with no adverse features.  4. No  bowel obstruction. Normal appendix.  Electronically Signed: By: Genevie Ann M.D. On: 01/26/2021 11:53  Defer to surgery for management I did order a DIC panel   Will f/u given elevated LFT's and previous mild CBD dilation   Gatha Mayer, MD, [facility] 01/26/2021 12:14 PM

## 2021-01-26 NOTE — Discharge Summary (Signed)
Name: Martha Hall MRN: [id number] DOB: 1995/08/02 25 y.o. PCP: Patient, No Pcp Per (Inactive)  Date of Admission: 01/24/2021  3:54 AM Date of Discharge: 01/31/2021 Attending Physician: Earl Lagos, MD  Discharge Diagnosis: 1. Acute cholecystitis 2. Acute blood loss anemia 3. Choledocholithiasis   Discharge Medications: Allergies as of 01/31/2021       Reactions   Metoclopramide Itching        Medication List     STOP taking these medications    AZO URINARY PAIN RELIEF PO   cephALEXin 500 MG capsule Commonly known as: KEFLEX   ibuprofen 200 MG tablet Commonly known as: ADVIL   ibuprofen 800 MG tablet Commonly known as: ADVIL   Prenatal Vitamin 27-0.8 MG Tabs       TAKE these medications    acetaminophen 500 MG tablet Commonly known as: TYLENOL Take 1,000 mg by mouth every 6 (six) hours as needed for moderate pain or headache.   esomeprazole 20 MG capsule Commonly known as: NexIUM Take 1 capsule (20 mg total) by mouth daily before breakfast.   famotidine 20 MG tablet Commonly known as: PEPCID Take 1 tablet (20 mg total) by mouth 2 (two) times daily.   meclizine 25 MG tablet Commonly known as: ANTIVERT Take 1 tablet (25 mg total) by mouth 3 (three) times daily as needed for dizziness.   medroxyPROGESTERone 150 MG/ML injection Commonly known as: DEPO-PROVERA Inject 1 mL (150 mg total) into the muscle every 3 (three) months.   oxyCODONE 5 MG immediate release tablet Commonly known as: Oxy IR/ROXICODONE Take 1 tablet (5 mg total) by mouth every 6 (six) hours as needed for breakthrough pain.   polyethylene glycol 17 g packet Commonly known as: MIRALAX / GLYCOLAX Take 17 g by mouth daily as needed for moderate constipation.   prochlorperazine 10 MG tablet Commonly known as: COMPAZINE Take 1 tablet (10 mg total) by mouth every 8 (eight) hours as needed for nausea or vomiting.   tiZANidine 4 MG tablet Commonly known as: Zanaflex Take 1  tablet (4 mg total) by mouth every 8 (eight) hours as needed for muscle spasms.        Disposition and follow-up:   Martha Hall was discharged from [facility] in Stable condition.  At the hospital follow up visit please address:  1.  She will have one suture in place in one of the ports from her lap chole when you see her in clinic on Friday.  I spoke with surgery in regards to this and due to patient's difficulty with transportation, surgery was okay with having the sutures removed at her follow-up appointment at the internal medicine clinic.  She will need to follow-up with surgery on October 18.  2.  Labs / imaging needed at time of follow-up: CBC  3.  Pending labs/ test needing follow-up: none  Follow-up Appointments:  Follow-up Information     [facility], [place]. Go on 02/22/2021.   Specialty: General Surgery Why: Your appointment is 02/22/2021 at 9:15am Please arrive 30 minutes prior to your appointment to check in and fill out paperwork. Bring photo ID and insurance information. Contact information: [address] [place] [address] [phone]        [facility], [place]. Go on 02/04/2021.   Specialty: General Surgery Why: 02/04/21 @ 2pm. This is a nurse visit for suture removal. Contact information: [address] [place] [address] [phone]  Hospital Course by problem list: 1. Acute cholecystitis Patient presented due to intermittent abdominal pain for the 3 weeks.  She presented to the urgent care on September 16 for evaluation of abdominal pain and was diagnosed with UTI and started on Keflex.  No improvement in her symptoms she presented to the ED.  In the ED labs significant for elevated AST/ALT/alkaline phosphatase with total bilirubin elevated at 3.8.  CBC without leukocytosis or anemia.  Upper quadrant ultrasound showed  numerous gallstones but no evidence of acute cholecystitis.  MRCP showed dilated duct but no gross evidence of common bile duct stone.  Due to symptomatic cholelithiasis, surgery proceeded with a laparoscopic cholecystectomy.  Intraoperatively, the bladder was seen to be dilated, acutely inflamed, and gangrenous.  The angiogram was unsuccessful due to the friable nature of the cystic duct.   2. Acute blood loss anemia Following laparoscopic cholecystectomy, patient was hypertensive and her hemoglobin was seem to have dropped from 12.3-6.7.  CT abdomen showed hemoperitoneum throughout the abdomen pelvis, with no acute active contrast extravasation identified to indicate the source of bleeding.  Patient was transfused 2 units of red blood cells.  No further surgeries were deemed necessary due to CT not showing active bleeding.  Hemoglobin improved to 9.2.  However on the following morning had decreased to 8.0, patient was transfused an additional unit.  Bleeding was visualized from one of the port sites and an additional suture was placed.  Patient can have suture removed at follow-up appointment with [facility] on 09/30th per surgery. 3. Choledocholithiasis Initial MRCP showed cholelithiasis with mild gallbladder distention, no visible filling defect to suggest choledocholithiasis.  During laparoscopic cholecystectomy cholangiogram was attempted, however due to to the friable nature of the cystic duct was not possible.  A small stone was milked from the cystic duct at that time.  Following surgery, bilirubin began to increase, as high as 7.1. Initially unclear if hemoperitoneum in the abdomen was increasing bilirubin versus possible retained stone.  Repeat MRCP showed increased biliary duct distention from previous imaging.  ERCP completed and revealed a small stone with sludge.  This was removed and Bilirubin decreased.   Discharge Exam:   BP (!) 147/81 (BP Location: Right Arm)   Pulse 87   Temp 98.9 F (37.2 C)  (Oral)   Resp 17   Ht 5\' 1"  (1.549 m)   Wt 108.4 kg   LMP 01/17/2021 (Approximate)   SpO2 99%   BMI 45.15 kg/m  Discharge exam:  General: Well developed, well nourished, in no acute distress HENT: NCAT Eyes: Scleral icterus present, conjunctiva clear CV: No murmurs, rubs, or gallops Pulm: Clear to auscultation bilaterally, pulmonary effort normal GI: Mild tenderness present in the right upper quadrant, bowel sounds present MSK: Able to ambulate without difficulty, normal bulk and tone Skin: Warm and dry Psych: Normal mood and affect  Pertinent Labs, Studies, and Procedures:  CBC Latest Ref Rng & Units 01/31/2021 01/30/2021 01/29/2021  WBC 4.0 - 10.5 K/uL 8.6 7.6 6.2  Hemoglobin 12.0 - 15.0 g/dL 01/31/2021) 9.9(I) 3.3(A)  Hematocrit 36.0 - 46.0 % 29.1(L) 26.7(L) 25.8(L)  Platelets 150 - 400 K/uL 311 287 231    BMP Latest Ref Rng & Units 01/31/2021 01/30/2021 01/29/2021  Glucose 70 - 99 mg/dL 01/31/2021) 77 93  BUN 6 - 20 mg/dL 10 8 5(L)  Creatinine 539(J - 1.00 mg/dL 6.73 4.19 3.79  Sodium 135 - 145 mmol/L 137 138 137  Potassium 3.5 - 5.1 mmol/L 3.6 3.9 3.7  Chloride  98 - 111 mmol/L 106 106 106  CO2 22 - 32 mmol/L 24 22 23   Calcium 8.9 - 10.3 mg/dL ) 1.6(X) 0.9(U)   09/19 RUQ U/s IMPRESSION: Numerous gallstones. The gallbladder is tender but there is no wall thickening or fluid typical of acute cholecystitis.  MRCP 09/19: IMPRESSION: Cholelithiasis with mild gallbladder distension without pericholecystic fluid or edema.   Mild dilation of the common bile duct with tapered appearance at the level of the pancreatic head. No visible filling defect to suggest choledocholithiasis. Findings could be related to recent passage of a biliary calculus or developing stricture. High-resolution MRCP sequences are limited due to respiratory motion. Trending of bilirubin may be helpful in this instance to determine next steps in management. If there remains clinical concern for  biliary obstruction or for acute cholecystitis HIDA scan may be helpful to add specificity.   No acute pancreatic findings.  09/21 CT Abd: IMPRESSION: 1. Positive for a moderate volume of Hemoperitoneum throughout the abdomen and pelvis, including a gallbladder shaped hematoma within the gallbladder fossa. No active contrast extravasation identified to indicate the source of bleeding.   2. Small layering pleural effusions with bilateral lung base atelectasis.   3. Postoperative changes to the ventral abdominal wall with no adverse features.   4. No bowel obstruction. Normal appendix.  09/23 MRCP: IMPRESSION: Choledocholithiasis is suspected though there is limited assessment on dedicated MRCP sequences. Increased biliary duct distension is present since previous imaging, it is possible the blood products/clot in the biliary tree could have a similar appearance.   Biliary duct distension increased since previous imaging of the prior study.   Infrahepatic hematoma with similar appearance along the inferior margin of the LEFT and RIGHT hemi liver with slightly diminished blood products along the lateral margin of the RIGHT hemi liver.   Small volume hemoperitoneum in the LEFT abdomen is similar.   Mild hepatic steatosis.   Basilar atelectasis and small effusions, lung bases not well assessed by MRI. Correlate with any signs of pulmonary infection.  09/24 ERCP: IMPRESSION: 1. Choledocholithiasis. 2. ERCP with sphincterotomy and balloon sweeping of the common duct.    Discharge Instructions: Discharge Instructions     Diet - low sodium heart healthy   Complete by: As directed    Discharge instructions   Complete by: As directed    Martha Hall, You were admitted due to an inflamed gallbladder.  This was removed, but you had some bleeding following the surgery.  After blood transfusions the bleeding slowed down, but there was a stone in your duct that blocked your  liver.  Your liver function labs have improved with the stone removal. Please follow-up with the Internal Medicine Clinic on 02/04/21 at 9:15AM to have your blood count checked and stitch removed. Please follow-up with surgery October 18th. I sent in some Miralax to help with your constipation, take that as needed. Thank you for letting October 20 take part in your care. Dr. [place]   Increase activity slowly   Complete by: As directed    No wound care   Complete by: As directed        Signed: Sloan Leiter, DO 01/31/2021, 3:14 PM   Pager: [phone]

## 2021-01-26 NOTE — Progress Notes (Signed)
Date and time results received: 01/26/21 1200 (use smartphrase ".now" to insert current time)  Test: HGB Critical Value: 6.7  Name of Provider Notified: Will notify Team  Orders Received? Or Actions Taken?:  waiting for Orders

## 2021-01-26 NOTE — Progress Notes (Signed)
Patient's  vital signs M[place]s was turned to yellow. Discussed patient;s conditions with charge nurse. Will continue monitor.

## 2021-01-26 NOTE — Progress Notes (Addendum)
1 Day Post-Op  Subjective: CC: Patient reports "tolerable 10/10" generalized abdominal pain. She reports this has improved from the pain she was having post op yesterday. She denies pain being worse in one location. She notes constant nausea without emesis. She tried to get oob yesterday but got lightheaded and dizzy. She denies ha, visual changes, lightheadedness or dizziness currently. She reports she is voiding. No flatus.   Objective: Vital signs in last 24 hours: Temp:  [97.5 F (36.4 C)-98.6 F (37 C)] 98 F (36.7 C) (09/21 0811) Pulse Rate:  [62-132] 132 (09/21 0811) Resp:  [17-26] 20 (09/21 0811) BP: (74-147)/(42-97) 110/79 (09/21 0811) SpO2:  [96 %-100 %] 100 % (09/21 0811) Weight:  [101.2 kg] 101.2 kg (09/20 0934) Last BM Date: 01/25/21  Intake/Output from previous day: 09/20 0701 - 09/21 0700 In: 2963 [I.V.:2850; IV Piggyback:113] Out: 340 [Urine:320; Blood:20] Intake/Output this shift: Total I/O In: -  Out: 100 [Urine:100]  PE: Gen:  Alert, NAD, pleasant HEENT: EOM's intact, pupils equal and round Card:  Tachycardic  Pulm:  CTAB, no W/R/R, effort normal Abd: Soft, mild distension, appropriately tender around laparoscopic incisions with some mild tenderness of the epigastrium and RUQ without peritonitis. +BS. Incisions with glue intact appears well and are without drainage, bleeding, or signs of infection Psych: A&Ox3  Skin: no rashes noted, warm and dry  Lab Results:  Recent Labs    01/25/21 1640 01/26/21 0633  WBC 11.6* 13.2*  HGB 11.1* 8.4*  HCT 34.7* 25.9*  PLT 411* 332   BMET Recent Labs    01/25/21 0541 01/26/21 0633  NA 138 135  K 3.9 4.5  CL 104 102  CO2 25 21*  GLUCOSE 100* 153*  BUN <5* 12  CREATININE 0.83 1.33*  CALCIUM 9.2 8.8*   PT/INR No results for input(s): LABPROT, INR in the last 72 hours. CMP     Component Value Date/Time   NA 135 01/26/2021 0633   K 4.5 01/26/2021 0633   CL 102 01/26/2021 0633   CO2 21 (L)  01/26/2021 0633   GLUCOSE 153 (H) 01/26/2021 0633   BUN 12 01/26/2021 0633   CREATININE 1.33 (H) 01/26/2021 0633   CALCIUM 8.8 (L) 01/26/2021 0633   PROT 5.9 (L) 01/26/2021 0633   ALBUMIN 2.9 (L) 01/26/2021 0633   AST 192 (H) 01/26/2021 0633   ALT 334 (H) 01/26/2021 0633   ALKPHOS 158 (H) 01/26/2021 0633   BILITOT 4.6 (H) 01/26/2021 0633   GFRNONAA 58 (L) 01/26/2021 0633   GFRAA >60 11/16/2019 0950   Lipase     Component Value Date/Time   LIPASE 31 01/24/2021 0403    Studies/Results: DG Cholangiogram Operative  Result Date: 01/25/2021 CLINICAL DATA:  Intraoperative cholangiogram during laparoscopic cholecystectomy. EXAM: INTRAOPERATIVE CHOLANGIOGRAM FLUOROSCOPY TIME:  14 seconds (8.0 mGy) COMPARISON:  Abdominal MRI-01/24/2021 FINDINGS: Intraoperative cholangiographic images of the right upper abdominal quadrant during laparoscopic cholecystectomy are provided for review. There is extravasation of contrast about the attempted cystic duct cannulation site without definitive opacification of either the cystic duct or intrahepatic biliary tree. There is partial opacification of the gallbladder with apparent filling defect at the level of the gallbladder fundus suggestive of cholelithiasis. IMPRESSION: Nondiagnostic intraoperative cholangiogram. Further evaluation with ERCP could be performed as indicated. Electronically Signed   By: Simonne Come M.D.   On: 01/25/2021 12:12   MR ABDOMEN MRCP W WO CONTAST  Result Date: 01/24/2021 CLINICAL DATA:  Right upper quadrant abdominal pain, nondiagnostic ultrasound 25-year-old female with  history of prior gestational diabetes. EXAM: MRI ABDOMEN WITHOUT AND WITH CONTRAST (INCLUDING MRCP) TECHNIQUE: Multiplanar multisequence MR imaging of the abdomen was performed both before and after the administration of intravenous contrast. Heavily T2-weighted images of the biliary and pancreatic ducts were obtained, and three-dimensional MRCP images were rendered by  post processing. CONTRAST:  41mL GADAVIST GADOBUTROL 1 MMOL/ML IV SOLN COMPARISON:  Comparison made with ultrasound evaluation of 01/24/2021. FINDINGS: Lower chest: Incidental imaging of the lung bases on MRI is unremarkable on limited assessment. Hepatobiliary: No focal, suspicious hepatic lesion. No substantial fat or iron deposition in the liver. Liver with smooth contours. The portal vein is patent. No pericholecystic stranding. Mildly distended gallbladder without pericholecystic fluid or edema. Cholelithiasis. Mild distension of the common bile duct up to 7 mm above the pancreatic head with tapering of the common bile duct extending into pancreatic parenchyma, tapered narrowing slightly above the expected level of the ampulla. No visible filling defect in the common bile duct, dedicated MRCP sequences are limited due to respiratory motion. No pancreatic lesion. Pancreas: Normal intrinsic T1 signal. No ductal dilation or sign of inflammation. No focal lesion. Spleen:  Top-normal size.  No focal lesion. Adrenals/Urinary Tract: Normal appearance of adrenal glands and kidneys, no hydronephrosis or perinephric stranding. No suspicious renal lesion. Stomach/Bowel: Unremarkable to the extent evaluated on abdominal MRI. Vascular/Lymphatic: No pathologically enlarged lymph nodes identified. No abdominal aortic aneurysm demonstrated. Other:  None. Musculoskeletal: No suspicious bone lesions identified. IMPRESSION: Cholelithiasis with mild gallbladder distension without pericholecystic fluid or edema. Mild dilation of the common bile duct with tapered appearance at the level of the pancreatic head. No visible filling defect to suggest choledocholithiasis. Findings could be related to recent passage of a biliary calculus or developing stricture. High-resolution MRCP sequences are limited due to respiratory motion. Trending of bilirubin may be helpful in this instance to determine next steps in management. If there remains  clinical concern for biliary obstruction or for acute cholecystitis HIDA scan may be helpful to add specificity. No acute pancreatic findings. Electronically Signed   By: Donzetta Kohut M.D.   On: 01/24/2021 13:26    Anti-infectives: Anti-infectives (From admission, onward)    Start     Dose/Rate Route Frequency Ordered Stop   01/25/21 1000  ceFAZolin (ANCEF) IVPB 2g/100 mL premix        2 g 200 mL/hr over 30 Minutes Intravenous  Once 01/25/21 0951 01/25/21 1052   01/25/21 0951  ceFAZolin (ANCEF) 2-4 GM/100ML-% IVPB       Note to Pharmacy: Phebe Colla   : cabinet override      01/25/21 0951 01/25/21 1058        Assessment/Plan POD 1 s/p Laparoscopic Cholecystectomy with attempted IOC, 01/26/2021 - Dr. Magnus Ivan for Gangrenous Cholecystitis  - MRCP negative for CBD stone but showed some mild dilatation fo the CBD w/ tapered appearance at the level of the pancreatitic head. IOC unable to be performed intra-op due to the friable nature of the cystic duct. From discussion it appeared with my team this am, it sounds as though she may have had almost a Mirizzi like picture. Her labs this am show continued elevation of LFT's w/ T. Bili > 4 now. I have contact GI given continued uptrending of LFT's w/ concern for possible CBD stone and their input if she will require ERCP, imaging etc - Given her GB was noted to have gangrenous intra-op, will start on some abx - Noted to be tachycardic with soft BP  overnight. She reports lightheaded and dizziness with standing. Her BP has improved this am with last being 110/79. She has an AKI this AM (Cr 0.83 > 1.33). She is not currently on any maintenance fluids. Will get Orthostatic vital signs, give 500cc bolus w/ 100cc/hr maintenance fluids to follow. Start cardiac monitoring. D/c NSAIDs given AKI. Strict I/O (UOP documented as 0.15ml/kg/hr but reports she has urinated several times this am).  - Patient hgb dropped from 11.1 > 8.4. Hold Lovenox and repeat CBC at  noon. She is appropriately tender on exam - Will discuss above with my attending. I have reached out to the primary team and discussed with them over the phone the above.   FEN - NPO for Gi eval. IVF VTE - SCDs ID - Ancef peri-op. Start Zosyn. Afebrile. 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

## 2021-01-27 DIAGNOSIS — R7401 Elevation of levels of liver transaminase levels: Secondary | ICD-10-CM | POA: Diagnosis not present

## 2021-01-27 DIAGNOSIS — N179 Acute kidney failure, unspecified: Secondary | ICD-10-CM | POA: Diagnosis not present

## 2021-01-27 LAB — CBC
HCT: 27.5 % — ABNORMAL LOW (ref 36.0–46.0)
HCT: 24.1 % — ABNORMAL LOW (ref 36.0–46.0)
Hemoglobin: 9.2 g/dL — ABNORMAL LOW (ref 12.0–15.0)
Hemoglobin: 8 g/dL — ABNORMAL LOW (ref 12.0–15.0)
MCH: 28.9 pg (ref 26.0–34.0)
MCH: 28.6 pg (ref 26.0–34.0)
MCHC: 33.5 g/dL (ref 30.0–36.0)
MCHC: 33.2 g/dL (ref 30.0–36.0)
MCV: 86.5 fL (ref 80.0–100.0)
MCV: 86.1 fL (ref 80.0–100.0)
Platelets: 193 10*3/uL (ref 150–400)
Platelets: 229 10*3/uL (ref 150–400)
RBC: 3.18 MIL/uL — ABNORMAL LOW (ref 3.87–5.11)
RBC: 2.8 MIL/uL — ABNORMAL LOW (ref 3.87–5.11)
RDW: 15.1 % (ref 11.5–15.5)
RDW: 15.1 % (ref 11.5–15.5)
WBC: 9.9 10*3/uL (ref 4.0–10.5)
WBC: 10.9 10*3/uL — ABNORMAL HIGH (ref 4.0–10.5)
nRBC: 0 % (ref 0.0–0.2)
nRBC: 0 % (ref 0.0–0.2)

## 2021-01-27 LAB — BILIRUBIN, FRACTIONATED(TOT/DIR/INDIR)
Bilirubin, Direct: 4.5 mg/dL — ABNORMAL HIGH (ref 0.0–0.2)
Indirect Bilirubin: 2.2 mg/dL — ABNORMAL HIGH (ref 0.3–0.9)
Total Bilirubin: 6.7 mg/dL — ABNORMAL HIGH (ref 0.3–1.2)

## 2021-01-27 LAB — COMPREHENSIVE METABOLIC PANEL
ALT: 288 U/L — ABNORMAL HIGH (ref 0–44)
AST: 188 U/L — ABNORMAL HIGH (ref 15–41)
Albumin: 2.7 g/dL — ABNORMAL LOW (ref 3.5–5.0)
Alkaline Phosphatase: 143 U/L — ABNORMAL HIGH (ref 38–126)
Anion gap: 9 (ref 5–15)
BUN: 10 mg/dL (ref 6–20)
CO2: 22 mmol/L (ref 22–32)
Calcium: 8.3 mg/dL — ABNORMAL LOW (ref 8.9–10.3)
Chloride: 105 mmol/L (ref 98–111)
Creatinine, Ser: 1 mg/dL (ref 0.44–1.00)
GFR, Estimated: >60 mL/min (ref >60)
Glucose, Bld: 97 mg/dL (ref 70–99)
Potassium: 5 mmol/L (ref 3.5–5.1)
Sodium: 136 mmol/L (ref 135–145)
Total Bilirubin: 6.1 mg/dL — ABNORMAL HIGH (ref 0.3–1.2)
Total Protein: 5.3 g/dL — ABNORMAL LOW (ref 6.5–8.1)

## 2021-01-27 LAB — PROTIME-INR
INR: 1.1 (ref 0.8–1.2)
Prothrombin Time: 13.7 seconds (ref 11.4–15.2)

## 2021-01-27 LAB — HEMOGLOBIN AND HEMATOCRIT, BLOOD
HCT: 29.3 % — ABNORMAL LOW (ref 36.0–46.0)
Hemoglobin: 9.9 g/dL — ABNORMAL LOW (ref 12.0–15.0)

## 2021-01-27 LAB — PREPARE RBC (CROSSMATCH)

## 2021-01-27 MED ORDER — LIDOCAINE HCL (PF) 1 % IJ SOLN
5.0000 mL | Freq: Once | INTRAMUSCULAR | Status: AC
  Administered 2021-01-27: 4 mL
  Filled 2021-01-27: qty 5

## 2021-01-27 MED ORDER — SILVER NITRATE-POT NITRATE 75-25 % EX MISC
1.0000 | CUTANEOUS | Status: DC | PRN
  Filled 2021-01-27: qty 1

## 2021-01-27 MED ORDER — SODIUM CHLORIDE 0.9% IV SOLUTION: Freq: Once | INTRAVENOUS | Status: AC

## 2021-01-27 NOTE — Progress Notes (Addendum)
HD#2 SUBJECTIVE:  Patient Summary: Martha Hall is a 25 y.o. 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  Surgery revealed gallbladder to be gangrenous and necrotic. Overnight into 09/21, Hgb dropped from 12.3 to 6.7.  She was given 2 units RBCs. No surgical interventions deemed necessary.  An additional unit given 09/22, due to Hbg drop from 9.2 to 8.0 on hospital day 2.        Overnight Events: No overnight events   OBJECTIVE:  Vital Signs: Vitals:   01/26/21 2013 01/26/21 2225 01/27/21 0358 01/27/21 0400  BP: 131/68 138/74 129/64   Pulse: (!) 130 (!) 119 (!) 123   Resp: '18 20 19   ' Temp: 100.1 F (37.8 C) 98.8 F (37.1 C) 98.6 F (37 C)   TempSrc: Oral Oral Oral   SpO2: 99% 100% 92%   Weight:    110.4 kg  Height:       Supplemental O2: Room Air SpO2: 92 % O2 Flow Rate (L/min): 2 L/min  Filed Weights   01/25/21 0421 01/25/21 0934 01/27/21 0400  Weight: 101.4 kg 101.2 kg 110.4 kg     Intake/Output Summary (Last 24 hours) at 01/27/2021 0615 Last data filed at 01/27/2021 6433 Gross per 24 hour  Intake 2276.01 ml  Output 900 ml  Net 1376.01 ml   Net IO Since Admission: 4,999.69 mL [01/27/21 0615]  Physical Exam: General: well-developed, well-nourished, obese HENT: NCAT Eyes:scleral icterus present, conjunctiva clear CV: tachycardic, no rubs, or gallops Pulm: CTAB, normal pulmonary effort GI: mild tenderness in right upper quadrant, no abdominal distension MSK: no edema in legs bilaterally,   Skin: warm and dry, no ecchymosis noted on abdomen, 3 laparoscopic incisions clean and covered in dermabond Psych: normal mood and affect  Patient Lines/Drains/Airways Status     Active Line/Drains/Airways     Name Placement date Placement time Site Days   Peripheral IV 01/24/21 22 G Left Antecubital 01/24/21   0513  Antecubital  3   Peripheral IV 01/25/21 20 G Anterior;Left Wrist 01/25/21  --  Wrist  2   External Urinary Catheter 01/26/21  1330  --  1   Incision (Closed) 01/25/21 Abdomen Other (Comment) 01/25/21  1054  -- 2   Incision - 4 Ports Abdomen 1: Umbilicus 2: Mid;Upper 3: Right;Medial 4: Right;Lateral 01/25/21  1049  -- 2            Pertinent Labs: CBC Latest Ref Rng & Units 01/27/2021 01/26/2021 01/26/2021  WBC 4.0 - 10.5 K/uL 9.9 - 7.3  Hemoglobin 12.0 - 15.0 g/dL 9.2(L) - 6.7(LL)  Hematocrit 36.0 - 46.0 % 27.5(L) - 20.2(L)  Platelets 150 - 400 K/uL 193 283 259    CMP Latest Ref Rng & Units 01/27/2021 01/26/2021 01/25/2021  Glucose 70 - 99 mg/dL 97 153(H) 100(H)  BUN 6 - 20 mg/dL 10 12 <5(L)  Creatinine 0.44 - 1.00 mg/dL 1.00 1.33(H) 0.83  Sodium 135 - 145 mmol/L 136 135 138  Potassium 3.5 - 5.1 mmol/L 5.0 4.5 3.9  Chloride 98 - 111 mmol/L 105 102 104  CO2 22 - 32 mmol/L 22 21(L) 25  Calcium 8.9 - 10.3 mg/dL 8.3(L) 8.8(L) 9.2  Total Protein 6.5 - 8.1 g/dL 5.3(L) 5.9(L) 6.4(L)  Total Bilirubin 0.3 - 1.2 mg/dL 6.1(H) 4.6(H) 3.6(H)  Alkaline Phos 38 - 126 U/L 143(H) 158(H) 157(H)  AST 15 - 41  U/L 188(H) 192(H) 153(H)  ALT 0 - 44 U/L 288(H) 334(H) 352(H)    No results for input(s): GLUCAP in the last 72 hours.   Pertinent Imaging: CT ANGIO GI BLEED  Addendum Date: 01/26/2021   ADDENDUM REPORT: 01/26/2021 12:10 ADDENDUM: Study discussed by telephone with Dr. Joneen Boers on 01/26/2021 at 12:09 . Electronically Signed   By: Genevie Ann M.D.   On: 01/26/2021 12:10   Result Date: 01/26/2021 CLINICAL DATA:  25 year old female with diffuse abdominal pain postoperative day 1 laparoscopic cholecystectomy. Gangrenous gallbladder. EXAM: CTA ABDOMEN AND PELVIS WITHOUT AND WITH CONTRAST TECHNIQUE: Multidetector CT imaging of the abdomen and pelvis was performed using the standard protocol during bolus administration of intravenous contrast. Multiplanar reconstructed images and MIPs were obtained  and reviewed to evaluate the vascular anatomy. CONTRAST:  126m OMNIPAQUE IOHEXOL 300 MG/ML  SOLN COMPARISON:  Abdomen ultrasound 01/24/2021. FINDINGS: VASCULAR Abdominal aorta and the major arterial branches in the abdomen and pelvis are patent and within normal limits. No atherosclerosis. Pelvic and proximal femoral arteries appear patent and within normal limits. On the venous phase images the portal venous system is patent. The hepatic veins, IVC, and central pelvic veins also appear to be patent. Review of the MIP images confirms the above findings. NON-VASCULAR Lower chest: Confluent but enhancing bilateral lower lobe atelectasis. Trace bilateral pleural effusion. No pericardial effusion. Hepatobiliary: Gallbladder shaped hematoma within the gallbladder fossa and additional globular, confluent hematoma caudal to the liver edge (series 6, image 74). Similar moderate volume of mixed density perihepatic free fluid including between the liver and the diaphragm. Cholecystectomy clips in the gallbladder fossa, and along the posterior liver edge. Liver enhancement is within normal limits. No convincing contrast extravasation on the arterial or portal venous phase images. Pancreas: Negative. Spleen: Negative aside from perisplenic fluid. Adrenals/Urinary Tract: Normal adrenal glands. Symmetric renal enhancement. Nonobstructed kidneys. Decompressed ureters. Diminutive and unremarkable bladder. Stomach/Bowel: Decompressed large bowel from the ascending colon distally. Gas-filled cecum is located anteriorly and may beyond a lax mesentery. Appendix remains normal on series 17, image 57. Decompressed terminal ileum. No dilated small bowel. Decompressed stomach and duodenum. No free air. However, there is widely scattered free fluid in the bilateral abdomen including the small bowel mesentery and both gutters, with complex fluid density. There are mild postoperative changes to the ventral abdominal wall including the  umbilicus with a small volume of abdominal wall gas (including along the undersurface of the right rectus muscle on series 6, image 158. No abdominal wall fluid collection. Lymphatic: No lymphadenopathy. Reproductive: Negative. Other: Moderate volume of layering fluid in the pelvis and lower peritoneal cavity with complex fluid density. Musculoskeletal: No acute osseous abnormality identified. Probable benign right femoral head bone island. IMPRESSION: 1. Positive for a moderate volume of Hemoperitoneum throughout the abdomen and pelvis, including a gallbladder shaped hematoma within the gallbladder fossa. No active contrast extravasation identified to indicate the source of bleeding. 2. Small layering pleural effusions with bilateral lung base atelectasis. 3. Postoperative changes to the ventral abdominal wall with no adverse features. 4. No bowel obstruction. Normal appendix. Electronically Signed: By: HGenevie AnnM.D. On: 01/26/2021 11:53    ASSESSMENT/PLAN:  Assessment: Active Problems:   Abdominal pain   Cholelithiasis  Martha Hall a 25y.o. 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  Surgery revealed gallbladder to be gangrenous and necrotic. Overnight  into 09/21, Hgb dropped from 12.3 to 6.7.  She was given 2 units RBCs. No surgical interventions deemed necessary.  An additional unit given 09/22, due to Hbg drop from 9.2 to 8.0 on hospital day 2.     Plan: #Acute blood loss anemia s/p laparoscopic cholecystectomy Patient underwent laparoscopic cholecystectomy 09/20.  On September 21, her hemoglobin dropped from 12.3 to 6.7.  CT abdomen showed no active extravasation but significant hemoperitoneum.  Patient transfused 2 units yesterday.  Repeat hemoglobin improved to 9.2.  However repeat CBC this morning had decreased to 8.0.  Patient was  transfused additional unit. -Follow CBC closely -Surgery follow-up and recommendations appreciated.  No surgical intervention at this time -GI following -We will continue with IV Zosyn per surgery (started given patient had an acutely inflamed gallbladder on resection).  Would consider stopping antibiotics in a.m. with no current infectious source -Patient remains tachycardic today with heart rates in the 100s and her hemoglobin decreased to 8.  Patient still hypovolemic secondary to her recent bleed.  Will transfuse additional unit.  DC today and continue with IV fluid resuscitation.   #Transaminitis GI consulted due to trend of bilirubin from 3.8--> 4.6-->6.1. AST/ ALT/ Alk phos have moderately improved.  In the setting of hemoperitoneum it is difficult to interpret her LFTs.  Her transaminases are improving and her alkaline phosphatase level is stable.  Her labs do not seem consistent with a retained stone.  However, her bilirubin has continued to increase and there was concern for possible retained stone.  GI consulted due to concern that residual stone was causing bilirubin increase.  No plans for ERCP at this time.  Possibly plan for MRCP tomorrow. -GI following, possible MRCP tomorrow -N.p.o. after midnight   #Acute Kidney Injury Creatinine improved  from 1.33-->1.00 with fluid resuscitation -Monitor BMP -LR bolus of 1L with 12m/hr of NS following bolus  Best Practice: Diet: NPO IVF: Fluids: 0.9NS, Rate:  150 cc/hr x 1 hrs VTE: last xarelto given 09/19 for one dose Code: Full AB: Zosyn Family Contact: grandmother updated at bedside DISPO: Anticipated discharge pending Medical stability.  Signature: KChristiana Fuchs D.O. Internal Medicine Resident, PGY-1 [facility]  Pager: #[phone]:[phone] AM, 01/27/2021   Please contact the on call pager after 5 pm and on weekends at [phone]

## 2021-01-27 NOTE — Progress Notes (Addendum)
[name], female   DOB: 02-27-1996, 25 y.o.   MRN: [id number]    Progress Note   Subjective   Day # 3  CC; elevated LFTs, acute anemia  Status post laparoscopic cholecystectomy 01/25/2021-gangrenous appearing gallbladder, IOC unsuccessful due to cystic duct friability of the cystic duct clipped  MRCP 01/24/2021-mild dilation of the CBD with a tapered appearance at the level of the pancreatic head no visible filling defect  CT -9/21-gallbladder shaped hematoma within the gallbladder fossa, additional confluent hematoma caudal to the liver edge, moderate volume of hemoperitoneum throughout the abdomen pelvis, small layering effusions  Labs-T bili 6.1/alk phos 143/AST 188/ALT 288 Hemoglobin up to 9.2 post 2 units yesterday then drifted back down to 8.0 receiving 3 rd  unit now  DIC screen yesterday not consistent with DIC  Patient says she is uncomfortable but not having severe pain currently ,asking about food      Objective   Vital signs in last 24 hours: Temp:  [97.3 F (36.3 C)-100.1 F (37.8 C)] 99 F (37.2 C) (09/22 1127) Pulse Rate:  [119-138] 123 (09/22 0841) Resp:  [18-22] 18 (09/22 1127) BP: (105-144)/(43-94) 117/58 (09/22 1127) SpO2:  [92 %-100 %] 99 % (09/22 0841) Weight:  [110.4 kg] 110.4 kg (09/22 0400) Last BM Date: 01/25/21 General:   AA female  in NAD, jaundiced Heart:  Regular rate and rhythm; no murmurs Lungs: Respirations even and unlabored, lungs CTA bilaterally Abdomen:  Soft, obese, mildly tender across the upper abdomen and nondistended. Normal bowel sounds.  Patient is bleeding from one of the incisional port sites in the right upper quadrant Extremities:  Without edema. Neurologic:  Alert and oriented,  grossly normal neurologically. Psych:  Cooperative. Normal mood and affect.  Intake/Output from previous day: 09/21 0701 - 09/22 0700 In: 2276 [I.V.:1517.4; Blood:701.3; IV Piggyback:57.4] Out: 900 [Urine:900] Intake/Output this  shift: Total I/O In: 433.1 [I.V.:433.1] Out: -   Lab Results: Recent Labs    01/26/21 1121 01/26/21 1427 01/27/21 0208 01/27/21 0919  WBC 7.3  --  9.9 10.9*  HGB 6.7*  --  9.2* 8.0*  HCT 20.2*  --  27.5* 24.1*  PLT 259 283 193 229   BMET Recent Labs    01/25/21 0541 01/26/21 0633 01/27/21 0208  NA 138 135 136  K 3.9 4.5 5.0  CL 104 102 105  CO2 25 21* 22  GLUCOSE 100* 153* 97  BUN <5* 12 10  CREATININE 0.83 1.33* 1.00  CALCIUM 9.2 8.8* 8.3*   LFT Recent Labs    01/27/21 0208 01/27/21 0919  PROT 5.3*  --   ALBUMIN 2.7*  --   AST 188*  --   ALT 288*  --   ALKPHOS 143*  --   BILITOT 6.1* 6.7*  BILIDIR  --  4.5*  IBILI  --  2.2*   PT/INR Recent Labs    01/26/21 1427  LABPROT 14.0  INR 1.1    Studies/Results: CT ANGIO GI BLEED  Addendum Date: 01/26/2021   ADDENDUM REPORT: 01/26/2021 12:10 ADDENDUM: Study discussed by telephone with Dr. Joneen Boers on 01/26/2021 at 12:09 . Electronically Signed   By: Genevie Ann M.D.   On: 01/26/2021 12:10   Result Date: 01/26/2021 CLINICAL DATA:  25 year old female with diffuse abdominal pain postoperative day 1 laparoscopic cholecystectomy. Gangrenous gallbladder. EXAM: CTA ABDOMEN AND PELVIS WITHOUT AND WITH CONTRAST TECHNIQUE: Multidetector CT imaging of the abdomen and pelvis was performed using the standard protocol during bolus administration of intravenous contrast.  Multiplanar reconstructed images and MIPs were obtained and reviewed to evaluate the vascular anatomy. CONTRAST:  193m OMNIPAQUE IOHEXOL 300 MG/ML  SOLN COMPARISON:  Abdomen ultrasound 01/24/2021. FINDINGS: VASCULAR Abdominal aorta and the major arterial branches in the abdomen and pelvis are patent and within normal limits. No atherosclerosis. Pelvic and proximal femoral arteries appear patent and within normal limits. On the venous phase images the portal venous system is patent. The hepatic veins, IVC, and central pelvic veins also appear to be patent. Review of  the MIP images confirms the above findings. NON-VASCULAR Lower chest: Confluent but enhancing bilateral lower lobe atelectasis. Trace bilateral pleural effusion. No pericardial effusion. Hepatobiliary: Gallbladder shaped hematoma within the gallbladder fossa and additional globular, confluent hematoma caudal to the liver edge (series 6, image 74). Similar moderate volume of mixed density perihepatic free fluid including between the liver and the diaphragm. Cholecystectomy clips in the gallbladder fossa, and along the posterior liver edge. Liver enhancement is within normal limits. No convincing contrast extravasation on the arterial or portal venous phase images. Pancreas: Negative. Spleen: Negative aside from perisplenic fluid. Adrenals/Urinary Tract: Normal adrenal glands. Symmetric renal enhancement. Nonobstructed kidneys. Decompressed ureters. Diminutive and unremarkable bladder. Stomach/Bowel: Decompressed large bowel from the ascending colon distally. Gas-filled cecum is located anteriorly and may beyond a lax mesentery. Appendix remains normal on series 17, image 57. Decompressed terminal ileum. No dilated small bowel. Decompressed stomach and duodenum. No free air. However, there is widely scattered free fluid in the bilateral abdomen including the small bowel mesentery and both gutters, with complex fluid density. There are mild postoperative changes to the ventral abdominal wall including the umbilicus with a small volume of abdominal wall gas (including along the undersurface of the right rectus muscle on series 6, image 158. No abdominal wall fluid collection. Lymphatic: No lymphadenopathy. Reproductive: Negative. Other: Moderate volume of layering fluid in the pelvis and lower peritoneal cavity with complex fluid density. Musculoskeletal: No acute osseous abnormality identified. Probable benign right femoral head bone island. IMPRESSION: 1. Positive for a moderate volume of Hemoperitoneum throughout the  abdomen and pelvis, including a gallbladder shaped hematoma within the gallbladder fossa. No active contrast extravasation identified to indicate the source of bleeding. 2. Small layering pleural effusions with bilateral lung base atelectasis. 3. Postoperative changes to the ventral abdominal wall with no adverse features. 4. No bowel obstruction. Normal appendix. Electronically Signed: By: HGenevie AnnM.D. On: 01/26/2021 11:53       Assessment / Plan:    #[age] old female with acute cholecystitis status post lap chole 01/25/2021 with finding of a gangrenous appearing gallbladder. Course complicated by postoperative bleed yesterday with moderate pneumoperitoneum on CT and hematoma in the gallbladder fossa, and confluent hematoma along the liver bed  Patient required 2 units of packed RBCs yesterday, hemoglobin down 1 g again today receiving third unit Patient is also bleeding from a port site when I examined her today  Question continued intra-abdominal bleed    #2 elevated LFTs preoperatively-MRCP prior to surgery minimally dilated CBD common bile duct , but no filling defect.  LFTs on the rise today, however in the setting of hemoperitoneum, gallbladder fossa hematoma and adjacent hematoma along the liver edge LFTs are difficult to interpret, and it is not at all clear that this rise in LFTs represents a CBD stone.  Plan; diet as per surgery, no ERCP planned  Trend LFTs Would pursue repeat MRCP prior to making any decision regarding need for ERCP. Consider repeat MRCP tomorrow.  Surgery to reevaluate regarding bleeding from incisional port.     Active Problems:   Abdominal pain   Cholelithiasis     LOS: 2 days   Amy Esterwood PA-C 01/27/2021, 12:14 PM      [facility] Attending   I have taken a history, reviewed the chart and examined the patient. I agree with the Advanced Practitioner's note, impression and recommendations.  Majority the medical decision-making in the  formulation of the assessment and plan were performed by me.  Plan MRCP tomorrow  Gatha Mayer, MD, [facility] 01/27/2021 8:36 PM

## 2021-01-27 NOTE — Progress Notes (Signed)
Lap site still continuing to bleed even after PA's attempted to use silver nitrate x2 to stop bleeding. Dr. Sheliah Hatch made aware and he came to bedside to place stich to lap site to stop bleeding.

## 2021-01-27 NOTE — Progress Notes (Signed)
2 Days Post-Op  Subjective: CC: Reports generalized abdominal soreness that is much improved from yesterday and greatest around her incisions but also epigastric/ruq. No nausea or emesis. Passing flatus. No bm yesterday. Was npo yesterday. Did not get oob. Voiding. Hgb 9.2 after 2U yesterday. She reports after getting blood her lightheadedness and dizziness resolved. She overall "feels better" as well. T. Bili up at 6.1  Objective: Vital signs in last 24 hours: Temp:  [97.3 F (36.3 C)-100.1 F (37.8 C)] 98.6 F (37 C) (09/22 0358) Pulse Rate:  [119-152] 123 (09/22 0358) Resp:  [18-22] 19 (09/22 0358) BP: (110-144)/(62-94) 129/64 (09/22 0358) SpO2:  [92 %-100 %] 92 % (09/22 0358) Weight:  [110.4 kg] 110.4 kg (09/22 0400) Last BM Date: 01/25/21  Intake/Output from previous day: 09/21 0701 - 09/22 0700 In: 2276 [I.V.:1517.4; Blood:701.3; IV Piggyback:57.4] Out: 900 [Urine:900] Intake/Output this shift: No intake/output data recorded.  PE: Gen:  Alert, NAD, pleasant HEENT: EOM's intact, pupils equal and round Card:  Tachycardic  Pulm:  CTAB, no W/R/R, effort normal Abd: Soft, mild distension, appropriately tender around laparoscopic incisions with some mild tenderness of the epigastrium and RUQ that is stable to slightly improved from yesterday and without peritonitis. +BS. Incisions with glue intact appears well and are without drainage, bleeding, or signs of infection Psych: A&Ox3  Msk: SCDs in place, no LE edema Skin: no rashes noted, warm and dry  Lab Results:  Recent Labs    01/26/21 1121 01/26/21 1427 01/27/21 0208  WBC 7.3  --  9.9  HGB 6.7*  --  9.2*  HCT 20.2*  --  27.5*  PLT 259 283 193   BMET Recent Labs    01/26/21 0633 01/27/21 0208  NA 135 136  K 4.5 5.0  CL 102 105  CO2 21* 22  GLUCOSE 153* 97  BUN 12 10  CREATININE 1.33* 1.00  CALCIUM 8.8* 8.3*   PT/INR Recent Labs    01/26/21 1427  LABPROT 14.0  INR 1.1   CMP     Component Value  Date/Time   NA 136 01/27/2021 0208   K 5.0 01/27/2021 0208   CL 105 01/27/2021 0208   CO2 22 01/27/2021 0208   GLUCOSE 97 01/27/2021 0208   BUN 10 01/27/2021 0208   CREATININE 1.00 01/27/2021 0208   CALCIUM 8.3 (L) 01/27/2021 0208   PROT 5.3 (L) 01/27/2021 0208   ALBUMIN 2.7 (L) 01/27/2021 0208   AST 188 (H) 01/27/2021 0208   ALT 288 (H) 01/27/2021 0208   ALKPHOS 143 (H) 01/27/2021 0208   BILITOT 6.1 (H) 01/27/2021 0208   GFRNONAA >60 01/27/2021 0208   GFRAA >60 11/16/2019 0950   Lipase     Component Value Date/Time   LIPASE 31 01/24/2021 0403    Studies/Results: DG Cholangiogram Operative  Result Date: 01/25/2021 CLINICAL DATA:  Intraoperative cholangiogram during laparoscopic cholecystectomy. EXAM: INTRAOPERATIVE CHOLANGIOGRAM FLUOROSCOPY TIME:  14 seconds (8.0 mGy) COMPARISON:  Abdominal MRI-01/24/2021 FINDINGS: Intraoperative cholangiographic images of the right upper abdominal quadrant during laparoscopic cholecystectomy are provided for review. There is extravasation of contrast about the attempted cystic duct cannulation site without definitive opacification of either the cystic duct or intrahepatic biliary tree. There is partial opacification of the gallbladder with apparent filling defect at the level of the gallbladder fundus suggestive of cholelithiasis. IMPRESSION: Nondiagnostic intraoperative cholangiogram. Further evaluation with ERCP could be performed as indicated. Electronically Signed   By: Simonne Come M.D.   On: 01/25/2021 12:12   CT  ANGIO GI BLEED  Addendum Date: 01/26/2021   ADDENDUM REPORT: 01/26/2021 12:10 ADDENDUM: Study discussed by telephone with Dr. Rosita Fire on 01/26/2021 at 12:09 . Electronically Signed   By: Odessa Fleming M.D.   On: 01/26/2021 12:10   Result Date: 01/26/2021 CLINICAL DATA:  25 year old female with diffuse abdominal pain postoperative day 1 laparoscopic cholecystectomy. Gangrenous gallbladder. EXAM: CTA ABDOMEN AND PELVIS WITHOUT AND WITH  CONTRAST TECHNIQUE: Multidetector CT imaging of the abdomen and pelvis was performed using the standard protocol during bolus administration of intravenous contrast. Multiplanar reconstructed images and MIPs were obtained and reviewed to evaluate the vascular anatomy. CONTRAST:  OMNIPAQUE IOHEXOL 300 MG/ML  SOLN COMPARISON:  Abdomen ultrasound 01/24/2021. FINDINGS: VASCULAR Abdominal aorta and the major arterial branches in the abdomen and pelvis are patent and within normal limits. No atherosclerosis. Pelvic and proximal femoral arteries appear patent and within normal limits. On the venous phase images the portal venous system is patent. The hepatic veins, IVC, and central pelvic veins also appear to be patent. Review of the MIP images confirms the above findings. NON-VASCULAR Lower chest: Confluent but enhancing bilateral lower lobe atelectasis. Trace bilateral pleural effusion. No pericardial effusion. Hepatobiliary: Gallbladder shaped hematoma within the gallbladder fossa and additional globular, confluent hematoma caudal to the liver edge (series 6, image 74). Similar moderate volume of mixed density perihepatic free fluid including between the liver and the diaphragm. Cholecystectomy clips in the gallbladder fossa, and along the posterior liver edge. Liver enhancement is within normal limits. No convincing contrast extravasation on the arterial or portal venous phase images. Pancreas: Negative. Spleen: Negative aside from perisplenic fluid. Adrenals/Urinary Tract: Normal adrenal glands. Symmetric renal enhancement. Nonobstructed kidneys. Decompressed ureters. Diminutive and unremarkable bladder. Stomach/Bowel: Decompressed large bowel from the ascending colon distally. Gas-filled cecum is located anteriorly and may beyond a lax mesentery. Appendix remains normal on series 17, image 57. Decompressed terminal ileum. No dilated small bowel. Decompressed stomach and duodenum. No free air. However, there is  widely scattered free fluid in the bilateral abdomen including the small bowel mesentery and both gutters, with complex fluid density. There are mild postoperative changes to the ventral abdominal wall including the umbilicus with a small volume of abdominal wall gas (including along the undersurface of the right rectus muscle on series 6, image 158. No abdominal wall fluid collection. Lymphatic: No lymphadenopathy. Reproductive: Negative. Other: Moderate volume of layering fluid in the pelvis and lower peritoneal cavity with complex fluid density. Musculoskeletal: No acute osseous abnormality identified. Probable benign right femoral head bone island. IMPRESSION: 1. Positive for a moderate volume of Hemoperitoneum throughout the abdomen and pelvis, including a gallbladder shaped hematoma within the gallbladder fossa. No active contrast extravasation identified to indicate the source of bleeding. 2. Small layering pleural effusions with bilateral lung base atelectasis. 3. Postoperative changes to the ventral abdominal wall with no adverse features. 4. No bowel obstruction. Normal appendix. Electronically Signed: By: Odessa Fleming M.D. On: 01/26/2021 11:53    Anti-infectives: Anti-infectives (From admission, onward)    Start     Dose/Rate Route Frequency Ordered Stop   01/26/21 1200  piperacillin-tazobactam (ZOSYN) IVPB 3.375 g        3.375 g 12.5 mL/hr over 240 Minutes Intravenous Every 8 hours 01/26/21 1003     01/25/21 1000  ceFAZolin (ANCEF) IVPB 2g/100 mL premix        2 g 200 mL/hr over 30 Minutes Intravenous  Once 01/25/21 0951 01/25/21 1052   01/25/21 0951  ceFAZolin (ANCEF)  2-4 GM/100ML-% IVPB       Note to Pharmacy: Phebe Colla   : cabinet override      01/25/21 0951 01/25/21 1058        Assessment/Plan POD 2 s/p Laparoscopic Cholecystectomy with attempted IOC, 01/26/2021 - Dr. Magnus Ivan for Gangrenous Cholecystitis  - MRCP negative for CBD stone but showed some mild dilatation fo the CBD  w/ tapered appearance at the level of the pancreatitic head. IOC unable to be performed intra-op due to the friable nature of the cystic duct. From discussion with my team, it sounds as though she may have had almost a Mirizzi like picture. Her labs this am show continued elevation of LFT's w/ T. Bili > 6 now. GI following to determine if patient will require ERCP - Given her GB was noted to have gangrenous intra-op, cont abx. WBC normalized. - Noted ABL anemia post op. Bleeding scan w/o active extrav. Patient received 2U PRBC with appropriate response (6.7 > 9.2) - Mobilize - Pulm toilet   FEN - NPO for GI recs. Okay for diet from our standpoint if no procedure planned. IVF per primary  VTE - SCDs ID - Ancef peri-op. Zosyn 9/21 >> Afebrile (tmax 100.1). 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

## 2021-01-27 NOTE — Plan of Care (Signed)

## 2021-01-27 NOTE — Progress Notes (Signed)
Called because of bleeding from laparoscopic site. I reviewed the patient chart. She has been tachycardic but otherwise fine. She had a CT scan with some hematoma around the liver and has received 3 units of blood since surgery. The right lateral incision site was slowly bleeding. Pressure was attempted but it recurred.   After obtaining consent, the area was anesthetized with 4 ml 1% lidocaine. A 2-0 nylon was used to close the wound with a figure of eight. The wound was hemostatic. Patient tolerated the procedure well.

## 2021-01-28 ENCOUNTER — Inpatient Hospital Stay (HOSPITAL_COMMUNITY): Payer: Medicaid Other

## 2021-01-28 DIAGNOSIS — K831 Obstruction of bile duct: Secondary | ICD-10-CM

## 2021-01-28 DIAGNOSIS — R17 Unspecified jaundice: Secondary | ICD-10-CM | POA: Diagnosis not present

## 2021-01-28 DIAGNOSIS — Z8679 Personal history of other diseases of the circulatory system: Secondary | ICD-10-CM | POA: Diagnosis not present

## 2021-01-28 DIAGNOSIS — Z01818 Encounter for other preprocedural examination: Secondary | ICD-10-CM | POA: Diagnosis not present

## 2021-01-28 DIAGNOSIS — K8051 Calculus of bile duct without cholangitis or cholecystitis with obstruction: Secondary | ICD-10-CM | POA: Diagnosis not present

## 2021-01-28 DIAGNOSIS — R109 Unspecified abdominal pain: Secondary | ICD-10-CM | POA: Diagnosis not present

## 2021-01-28 DIAGNOSIS — Z9889 Other specified postprocedural states: Secondary | ICD-10-CM | POA: Diagnosis not present

## 2021-01-28 LAB — TYPE AND SCREEN
ABO/RH(D): A POS
Antibody Screen: NEGATIVE
Unit division: 0
Unit division: 0
Unit division: 0
Unit division: 0

## 2021-01-28 LAB — BPAM RBC
Blood Product Expiration Date: 202209282359
Blood Product Expiration Date: 202209282359
Blood Product Expiration Date: 202210232359
Blood Product Expiration Date: 202210242359
ISSUE DATE / TIME: 202209211645
ISSUE DATE / TIME: 202209211951
ISSUE DATE / TIME: 202209221106
ISSUE DATE / TIME: 202209221705
Unit Type and Rh: 6200
Unit Type and Rh: 6200
Unit Type and Rh: 6200
Unit Type and Rh: 6200

## 2021-01-28 LAB — BASIC METABOLIC PANEL
Anion gap: 6 (ref 5–15)
BUN: 8 mg/dL (ref 6–20)
CO2: 23 mmol/L (ref 22–32)
Calcium: 8.4 mg/dL — ABNORMAL LOW (ref 8.9–10.3)
Chloride: 107 mmol/L (ref 98–111)
Creatinine, Ser: 0.83 mg/dL (ref 0.44–1.00)
GFR, Estimated: >60 mL/min (ref >60)
Glucose, Bld: 99 mg/dL (ref 70–99)
Potassium: 5 mmol/L (ref 3.5–5.1)
Sodium: 136 mmol/L (ref 135–145)

## 2021-01-28 LAB — CBC
HCT: 25.1 % — ABNORMAL LOW (ref 36.0–46.0)
HCT: 27.5 % — ABNORMAL LOW (ref 36.0–46.0)
Hemoglobin: 8.6 g/dL — ABNORMAL LOW (ref 12.0–15.0)
Hemoglobin: 9 g/dL — ABNORMAL LOW (ref 12.0–15.0)
MCH: 29.6 pg (ref 26.0–34.0)
MCH: 28.4 pg (ref 26.0–34.0)
MCHC: 34.3 g/dL (ref 30.0–36.0)
MCHC: 32.7 g/dL (ref 30.0–36.0)
MCV: 86.3 fL (ref 80.0–100.0)
MCV: 86.8 fL (ref 80.0–100.0)
Platelets: 195 10*3/uL (ref 150–400)
Platelets: 230 10*3/uL (ref 150–400)
RBC: 2.91 MIL/uL — ABNORMAL LOW (ref 3.87–5.11)
RBC: 3.17 MIL/uL — ABNORMAL LOW (ref 3.87–5.11)
RDW: 15.4 % (ref 11.5–15.5)
RDW: 15.4 % (ref 11.5–15.5)
WBC: 6.9 10*3/uL (ref 4.0–10.5)
WBC: 7.6 10*3/uL (ref 4.0–10.5)
nRBC: 0 % (ref 0.0–0.2)
nRBC: 0.3 % — ABNORMAL HIGH (ref 0.0–0.2)

## 2021-01-28 LAB — HEPATIC FUNCTION PANEL
ALT: 228 U/L — ABNORMAL HIGH (ref 0–44)
AST: 152 U/L — ABNORMAL HIGH (ref 15–41)
Albumin: 2.5 g/dL — ABNORMAL LOW (ref 3.5–5.0)
Alkaline Phosphatase: 144 U/L — ABNORMAL HIGH (ref 38–126)
Bilirubin, Direct: 3.3 mg/dL — ABNORMAL HIGH (ref 0.0–0.2)
Indirect Bilirubin: 3.8 mg/dL — ABNORMAL HIGH (ref 0.3–0.9)
Total Bilirubin: 7.1 mg/dL — ABNORMAL HIGH (ref 0.3–1.2)
Total Protein: 5 g/dL — ABNORMAL LOW (ref 6.5–8.1)

## 2021-01-28 IMAGING — MR MR ABDOMEN WO/W CM MRCP
12 of 22 series · 20 of 48 positions shown · IV contrast (gadavist)
Comparison: [DATE] preoperative assessment.

CLINICAL DATA: Jaundice in a 25-year-old female, history of
reported gangrenous cholecystitis, post cholecystectomy with
attempted cholangiogram at the time of surgery which was
unsuccessful in the setting of gangrenous cholecystitis.

EXAM:
MRI ABDOMEN WITHOUT AND WITH CONTRAST (INCLUDING MRCP)
TECHNIQUE: Multiplanar multisequence MR imaging of the abdomen was performed
both before and after the administration of intravenous contrast.
Heavily T2-weighted images of the biliary and pancreatic ducts were
obtained, and three-dimensional MRCP images were rendered by post
processing.
CONTRAST:  10mL GADAVIST GADOBUTROL 1 MMOL/ML IV SOLN

[Series 3: cor ssfse nav · coronal · 6.0mm · 0.78mm/px · 1 of 33 slices shown]
[im 1/33]
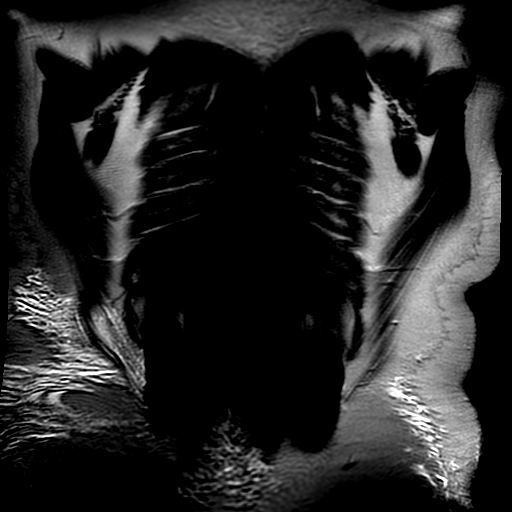

[Series 5: T2 fat-sat · axial · 6.0mm · 0.74mm/px · 1 of 29 slices shown]
[im 1/29]
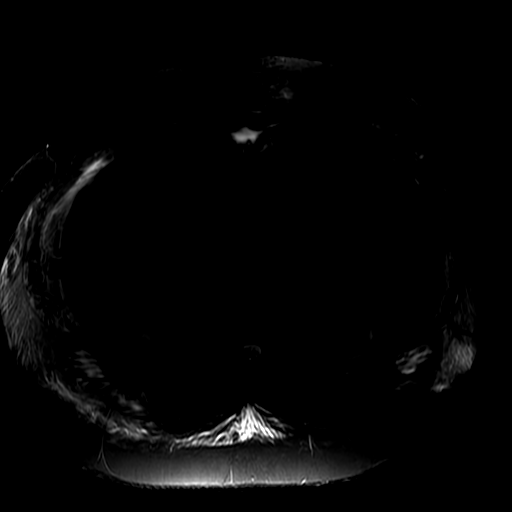

[Series 6: ax ssfse nav · axial · 6.0mm · 0.74mm/px · 1 of 32 slices shown]
[im 1/32]
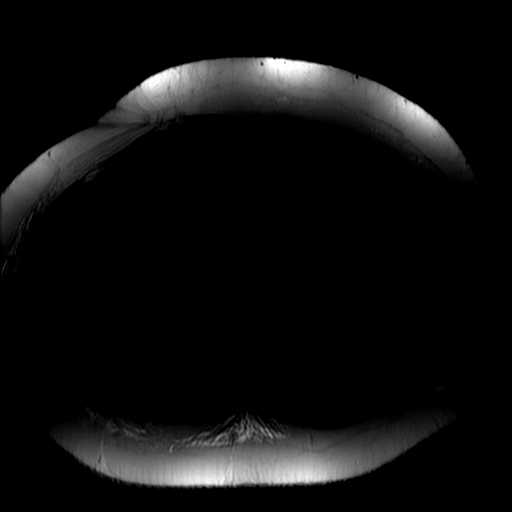

[Series 8: DWI b500 · axial · 8.0mm · 1.48mm/px · 1 of 64 slices shown]
[im 1/64]
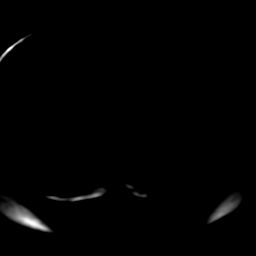

[Series 9: radial 2d thick · coronal · 40.0mm · 0.86mm/px · 1 of 6 slices shown]
[im 1/6]
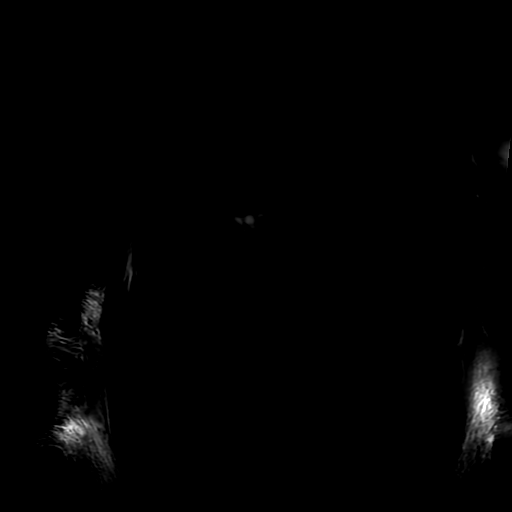

[Series 11: T1 dynamic · axial · 5.0mm · 0.82mm/px · z∈[-52,+225]mm · 2 of 112 slices shown (1 of 4)]
[im 1/112]
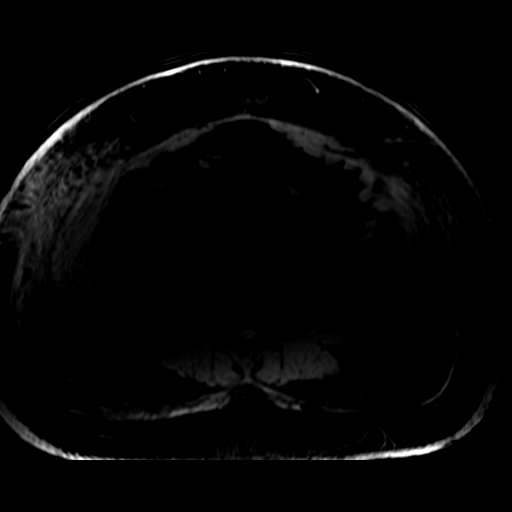
[im 112/112]
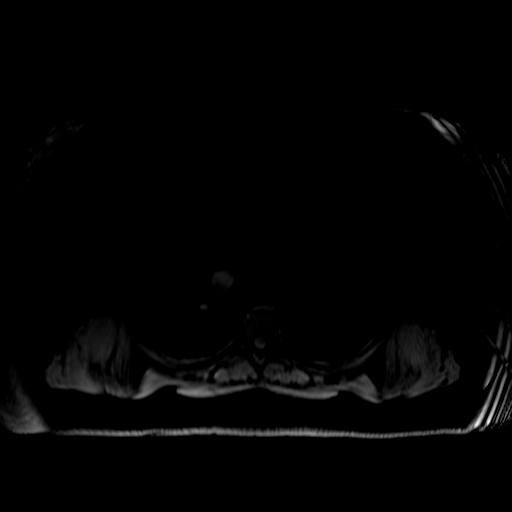

[Series 12: bSSFP · coronal · 6.0mm · 0.78mm/px · 1 of 31 slices shown]
[im 1/31]
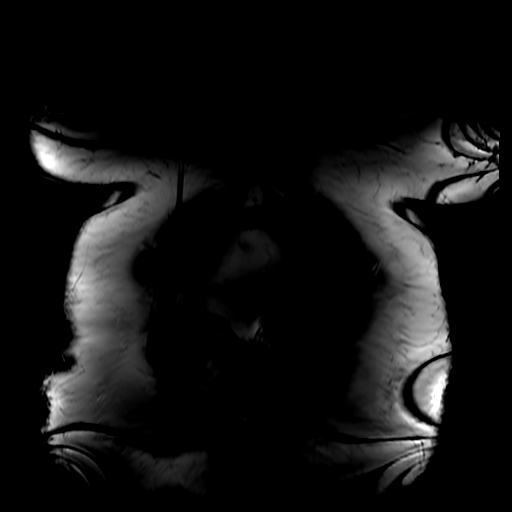

[Series 14: T1 dynamic · coronal · 3.4mm · 1.56mm/px · 2 of 120 slices shown (2 of 4)]
[im 1/120]
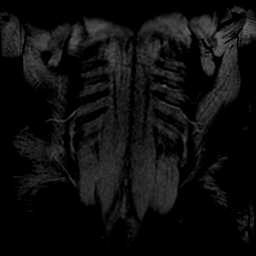
[im 120/120]
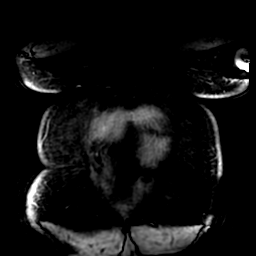

[Series 850: ADC · axial · 8.0mm · 1.48mm/px · 1 of 32 slices shown]
[im 1/32]
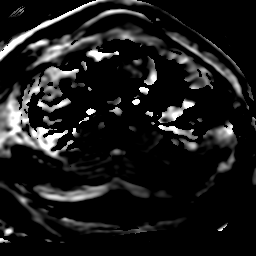

[Series 1101: T1 dynamic · axial · 5.0mm · 0.82mm/px · z∈[-52,+225]mm · 3 of 112 slices shown (3 of 4)]
[im 1/112]
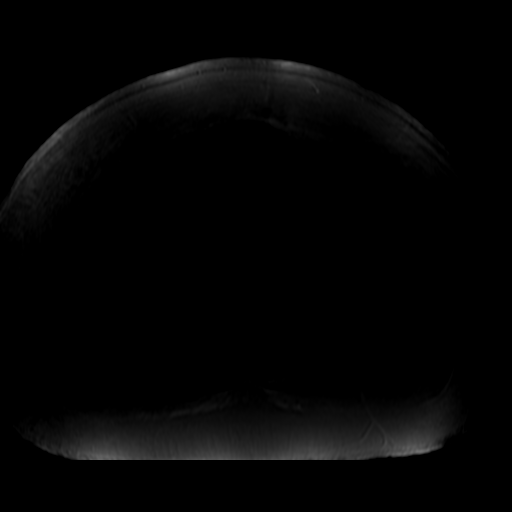
[im 56/112]
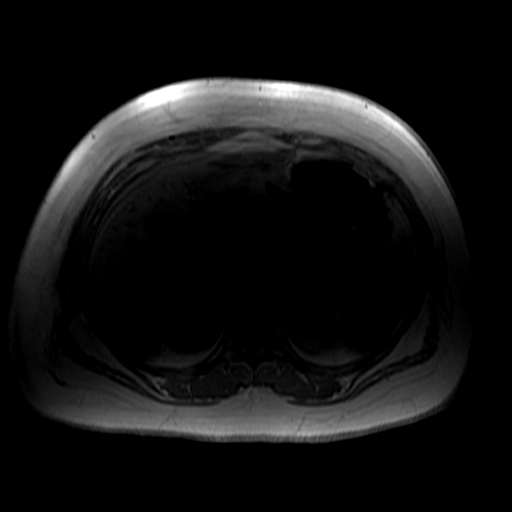
[im 112/112]
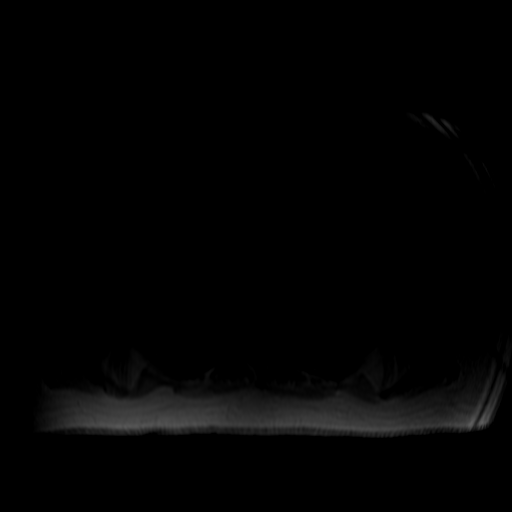

[Series 1102: T1 dynamic · axial · 5.0mm · 0.82mm/px · z∈[-52,+225]mm · 3 of 112 slices shown (4 of 4)]
[im 1/112]
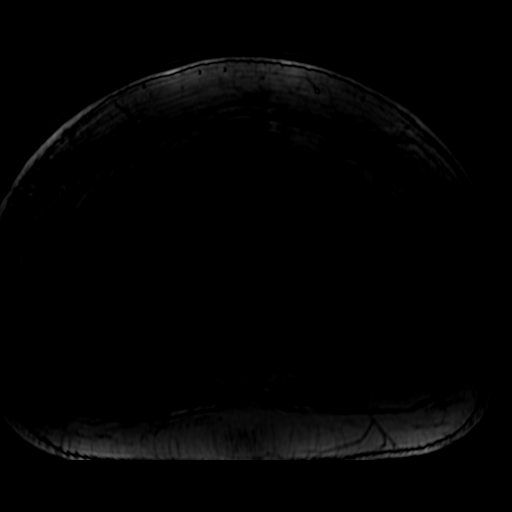
[im 56/112]
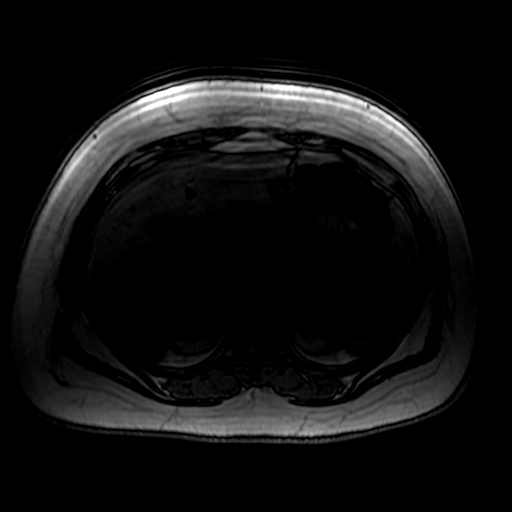
[im 112/112]
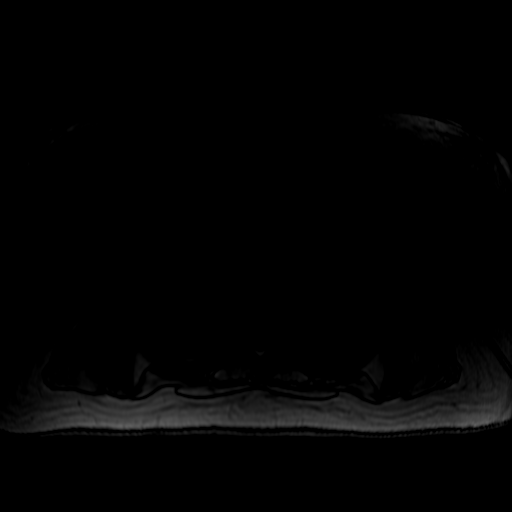

[Series 1300: T1 dynamic post-contrast · axial · non-contrast · 4.0mm · 0.86mm/px · z∈[-22,+208]mm · 3 of 116 slices shown]
[im 1/116]
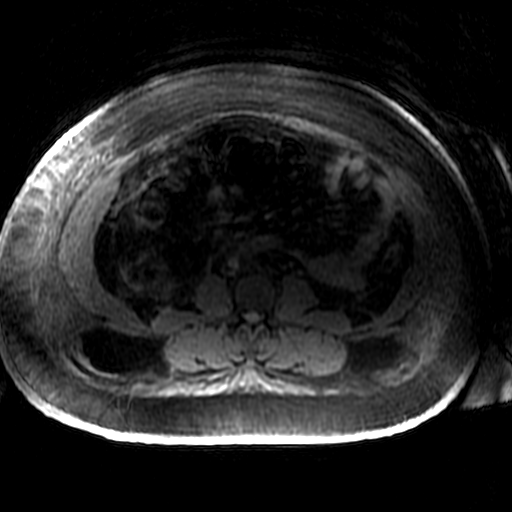
[im 58/116]
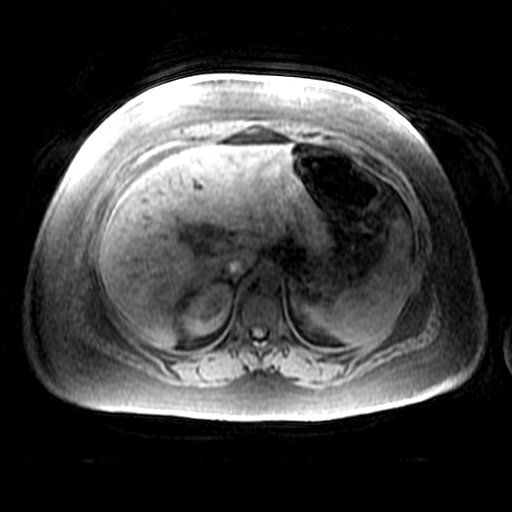
[im 116/116]
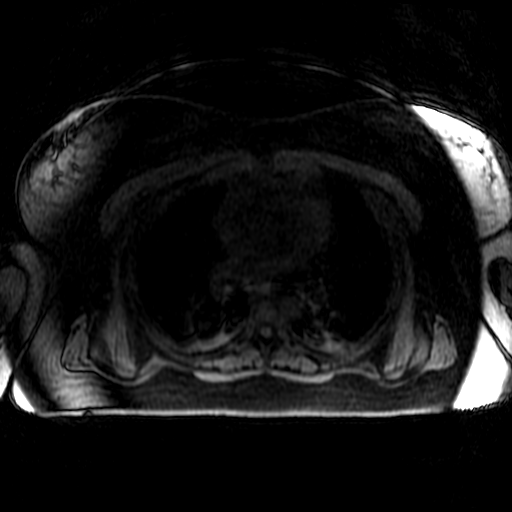

[20 of 48 positions shown; findings below may reference images not displayed]

FINDINGS: Lower chest: Basilar atelectasis and small effusions, lung bases not
well assessed by MRI.

Hepatobiliary: Hematoma surrounding the liver inferior is largely
similar to the prior study. Decreased hematoma and hemoperitoneum
along the margin of the lateral and superior liver. Surgical clips
in the porta hepatis compatible with cholecystectomy. Mild hepatic
steatosis. No visible hepatic lesion. Hepatic arterial supply derive
from celiac axis and classic fashion.

Biliary duct distension increased since previous imaging of the
hepatic duct which is approximately 10 mm as compared to 8 mm on the
prior study.

MRCP sequences are markedly limited due to motion and susceptibility
artifact. Axial high-resolution single shot images display dependent
low signal and potential small rounded filling defects most
suggestive of several small stones in the distal common bile duct.
(Image [DATE]) also on image [DATE]).

Pancreas: Pancreas with normal intrinsic T1 signal. No signs of
adjacent inflammation.

Spleen:  Normal size and contour without visible lesion.

Adrenals/Urinary Tract: Normal adrenal glands. Symmetric renal
enhancement. No hydronephrosis.

Stomach/Bowel: Unremarkable to the extent evaluated on abdominal MRI
not performed for bowel evaluation.

Vascular/Lymphatic: No pathologically enlarged lymph nodes
identified. No abdominal aortic aneurysm demonstrated.

Other: No significant change in the infrahepatic
hematoma/hemoperitoneum. Ovoid shaped area (image 85/[B9] 6.7 x
cm previously 6.9 x 4.9 cm.

RIGHT subhepatic hematoma 7.5 x 4.6 cm, when measured in a similar
fashion 7.9 x 4.3 cm.

Lesser sac/gastrohepatic hematoma is similar. Hemoperitoneum along
the margin of the RIGHT hemi liver has diminished.

Small hemoperitoneum in the LEFT hemiabdomen is grossly similar.
Susceptibility effect in the porta hepatis related to hematoma does
limit assessment.

Musculoskeletal: No suspicious bone lesions identified.
IMPRESSION: Choledocholithiasis is suspected though there is limited assessment
on dedicated MRCP sequences. Increased biliary duct distension is
present since previous imaging, it is possible the blood
products/clot in the biliary tree could have a similar appearance.

Biliary duct distension increased since previous imaging of the
prior study.

Infrahepatic hematoma with similar appearance along the inferior
margin of the LEFT and RIGHT hemi liver with slightly diminished
blood products along the lateral margin of the RIGHT hemi liver.

Small volume hemoperitoneum in the LEFT abdomen is similar.

Mild hepatic steatosis.

Basilar atelectasis and small effusions, lung bases not well
assessed by MRI. Correlate with any signs of pulmonary infection.

## 2021-01-28 MED ORDER — SODIUM CHLORIDE 0.9 % IV SOLN
1.5000 g | Freq: Once | INTRAVENOUS | Status: AC
  Administered 2021-01-29: 1.5 g via INTRAVENOUS
  Filled 2021-01-28 (×3): qty 4

## 2021-01-28 MED ORDER — GADOBUTROL 1 MMOL/ML IV SOLN
10.0000 mL | Freq: Once | INTRAVENOUS | Status: AC | PRN
  Administered 2021-01-28: 10 mL via INTRAVENOUS

## 2021-01-28 NOTE — Progress Notes (Signed)
Patient Name: Martha Hall Date of Encounter: 01/28/2021, 3:06 PM    Subjective  8/10 abd pain Tol clears   Objective  BP (!) 157/88 (BP Location: Right Arm)   Pulse 100   Temp 98.9 F (37.2 C)   Resp 18   Ht 5\' 1"  (1.549 m)   Wt 108.3 kg   LMP 01/17/2021 (Approximate)   SpO2 98%   BMI 45.11 kg/m  Obese, icteric NAD   Recent Labs  Lab 01/24/21 0403 01/24/21 0820 01/25/21 0541 01/26/21 01/28/21 01/26/21 1427 01/27/21 0208 01/27/21 0919 01/27/21 1528 01/28/21 0254  AST 142*  --  153* 192*  --  188*  --   --  152*  ALT 456*  --  352* 334*  --  288*  --   --  228*  ALKPHOS 158*  --  157* 158*  --  143*  --   --  144*  BILITOT 3.8*   < > 3.6* 4.6*  --  6.1* 6.7*  --  7.1*  PROT 6.8  --  6.4* 5.9*  --  5.3*  --   --  5.0*  ALBUMIN 3.4*  --  3.1* 2.9*  --  2.7*  --   --  2.5*  INR  --   --   --   --  1.1  --   --  1.1  --    < > = values in this interval not displayed.   CBC Latest Ref Rng & Units 01/28/2021 01/28/2021 01/27/2021  WBC 4.0 - 10.5 K/uL 7.6 6.9 -  Hemoglobin 12.0 - 15.0 g/dL 9.0(L) 8.6(L) 9.9(L)  Hematocrit 36.0 - 46.0 % 27.5(L) 25.1(L) 29.3(L)  Platelets 150 - 400 K/uL 230 195 -    MR 3D Recon At Scanner CLINICAL DATA:  Jaundice in a 25 year old female, history of reported gangrenous cholecystitis, post cholecystectomy with attempted cholangiogram at the time of surgery which was unsuccessful in the setting of gangrenous cholecystitis.  EXAM: MRI ABDOMEN WITHOUT AND WITH CONTRAST (INCLUDING MRCP)  TECHNIQUE: Multiplanar multisequence MR imaging of the abdomen was performed both before and after the administration of intravenous contrast. Heavily T2-weighted images of the biliary and pancreatic ducts were obtained, and three-dimensional MRCP images were rendered by post processing.  CONTRAST:  16mL GADAVIST GADOBUTROL 1 MMOL/ML IV SOLN  COMPARISON:  January 24, 2021 preoperative assessment.  FINDINGS: Lower chest: Basilar atelectasis  and small effusions, lung bases not well assessed by MRI.  Hepatobiliary: Hematoma surrounding the liver inferior is largely similar to the prior study. Decreased hematoma and hemoperitoneum along the margin of the lateral and superior liver. Surgical clips in the porta hepatis compatible with cholecystectomy. Mild hepatic steatosis. No visible hepatic lesion. Hepatic arterial supply derive from celiac axis and classic fashion.  Biliary duct distension increased since previous imaging of the hepatic duct which is approximately 10 mm as compared to 8 mm on the prior study.  MRCP sequences are markedly limited due to motion and susceptibility artifact. Axial high-resolution single shot images display dependent low signal and potential small rounded filling defects most suggestive of several small stones in the distal common bile duct. (Image 26/6) also on image 25/6).  Pancreas: Pancreas with normal intrinsic T1 signal. No signs of adjacent inflammation.  Spleen:  Normal size and contour without visible lesion.  Adrenals/Urinary Tract: Normal adrenal glands. Symmetric renal enhancement. No hydronephrosis.  Stomach/Bowel: Unremarkable to the extent evaluated on abdominal MRI not performed for bowel evaluation.  Vascular/Lymphatic: No pathologically enlarged  lymph nodes identified. No abdominal aortic aneurysm demonstrated.  Other: No significant change in the infrahepatic hematoma/hemoperitoneum. Ovoid shaped area (image 85/1300 6.7 x 4.8 cm previously 6.9 x 4.9 cm.  RIGHT subhepatic hematoma 7.5 x 4.6 cm, when measured in a similar fashion 7.9 x 4.3 cm.  Lesser sac/gastrohepatic hematoma is similar. Hemoperitoneum along the margin of the RIGHT hemi liver has diminished.  Small hemoperitoneum in the LEFT hemiabdomen is grossly similar. Susceptibility effect in the porta hepatis related to hematoma does limit assessment.  Musculoskeletal: No suspicious bone lesions  identified.  IMPRESSION: Choledocholithiasis is suspected though there is limited assessment on dedicated MRCP sequences. Increased biliary duct distension is present since previous imaging, it is possible the blood products/clot in the biliary tree could have a similar appearance.  Biliary duct distension increased since previous imaging of the prior study.  Infrahepatic hematoma with similar appearance along the inferior margin of the LEFT and RIGHT hemi liver with slightly diminished blood products along the lateral margin of the RIGHT hemi liver.  Small volume hemoperitoneum in the LEFT abdomen is similar.  Mild hepatic steatosis.  Basilar atelectasis and small effusions, lung bases not well assessed by MRI. Correlate with any signs of pulmonary infection.  Electronically Signed   By: Donzetta Kohut M.D.   On: 01/28/2021 13:38   Assessment and Plan  Jaundice w/ biliary obstruction and suspected choledocholithiasis  Plan for ERCP tomorrow  The risks and benefits as well as alternatives of endoscopic procedure(s) have been discussed and reviewed. All questions answered. The patient agrees to proceed.Risk of pancreatitis reviewed.  Mom present for discussion.  We reviewed pathophysiology of her problems, hepatic steatosis, life after cholecystectomy.  Iva Boop, MD, [facility] 01/28/2021 3:09 PM     Iva Boop, MD, [facility] 01/28/2021 3:06 PM

## 2021-01-28 NOTE — Progress Notes (Signed)
3 Days Post-Op  Subjective: CC: Figure of 8 stitch placed to lap site overnight. No further bleeding. Patient reports less pain around her incisions and in her epigastrium. She tolerated her dinner yesterday without increased pain or n/v. She reports after eating she felt like she "got all her energy back". She mobilized in the room yesterday but not to the hall. Was up in the chair for most of the day. Voiding. Passing flatus. No BM. T. Bili up today > 7. GI planning MRCP.  Objective: Vital signs in last 24 hours: Temp:  [97.9 F (36.6 C)-99 F (37.2 C)] 98.9 F (37.2 C) (09/23 0752) Pulse Rate:  [100-123] 100 (09/23 0752) Resp:  [18-20] 18 (09/23 0752) BP: (105-157)/(43-88) 157/88 (09/23 0752) SpO2:  [94 %-100 %] 98 % (09/23 0752) Last BM Date: 01/25/21  Intake/Output from previous day: 09/22 0701 - 09/23 0700 In: 2820 [P.O.:360; I.V.:1976.7; Blood:343.8; IV Piggyback:139.6] Out: -  Intake/Output this shift: No intake/output data recorded.  PE: Gen:  Alert, NAD, pleasant HEENT: EOM's intact, pupils equal and round Card:  Tachycardic at ~100 Pulm:  CTAB, no W/R/R, effort normal Abd: Soft, mild distension, appropriately tender around laparoscopic incisions with some mild tenderness of the epigastrium and RUQ that is stable to slightly improved from yesterday and without peritonitis. +BS. Right lower lateral laparoscopic incision site with stitch in place that is c/d/I without any signs of further bleeding. Otherwise, remaining incisions with glue intact appears well and are without drainage, bleeding, or signs of infection Psych: A&Ox3  Skin: no rashes noted, warm and dry  Lab Results:  Recent Labs    01/27/21 0919 01/27/21 1528 01/28/21 0427  WBC 10.9*  --  6.9  HGB 8.0* 9.9* 8.6*  HCT 24.1* 29.3* 25.1*  PLT 229  --  195   BMET Recent Labs    01/27/21 0208 01/28/21 0254  NA 136 136  K 5.0 5.0  CL 105 107  CO2 22 23  GLUCOSE 97 99  BUN 10 8  CREATININE  1.00 0.83  CALCIUM 8.3* 8.4*   PT/INR Recent Labs    01/26/21 1427 01/27/21 1528  LABPROT 14.0 13.7  INR 1.1 1.1   CMP     Component Value Date/Time   NA 136 01/28/2021 0254   K 5.0 01/28/2021 0254   CL 107 01/28/2021 0254   CO2 23 01/28/2021 0254   GLUCOSE 99 01/28/2021 0254   BUN 8 01/28/2021 0254   CREATININE 0.83 01/28/2021 0254   CALCIUM 8.4 (L) 01/28/2021 0254   PROT 5.0 (L) 01/28/2021 0254   ALBUMIN 2.5 (L) 01/28/2021 0254   AST 152 (H) 01/28/2021 0254   ALT 228 (H) 01/28/2021 0254   ALKPHOS 144 (H) 01/28/2021 0254   BILITOT 7.1 (H) 01/28/2021 0254   GFRNONAA >60 01/28/2021 0254   GFRAA >60 11/16/2019 0950   Lipase     Component Value Date/Time   LIPASE 31 01/24/2021 0403    Studies/Results: CT ANGIO GI BLEED  Addendum Date: 01/26/2021   ADDENDUM REPORT: 01/26/2021 12:10 ADDENDUM: Study discussed by telephone with Dr. Rosita Fire on 01/26/2021 at 12:09 . Electronically Signed   By: Odessa Fleming M.D.   On: 01/26/2021 12:10   Result Date: 01/26/2021 CLINICAL DATA:  25 year old female with diffuse abdominal pain postoperative day 1 laparoscopic cholecystectomy. Gangrenous gallbladder. EXAM: CTA ABDOMEN AND PELVIS WITHOUT AND WITH CONTRAST TECHNIQUE: Multidetector CT imaging of the abdomen and pelvis was performed using the standard protocol during bolus administration of intravenous  contrast. Multiplanar reconstructed images and MIPs were obtained and reviewed to evaluate the vascular anatomy. CONTRAST:  OMNIPAQUE IOHEXOL 300 MG/ML  SOLN COMPARISON:  Abdomen ultrasound 01/24/2021. FINDINGS: VASCULAR Abdominal aorta and the major arterial branches in the abdomen and pelvis are patent and within normal limits. No atherosclerosis. Pelvic and proximal femoral arteries appear patent and within normal limits. On the venous phase images the portal venous system is patent. The hepatic veins, IVC, and central pelvic veins also appear to be patent. Review of the MIP images confirms  the above findings. NON-VASCULAR Lower chest: Confluent but enhancing bilateral lower lobe atelectasis. Trace bilateral pleural effusion. No pericardial effusion. Hepatobiliary: Gallbladder shaped hematoma within the gallbladder fossa and additional globular, confluent hematoma caudal to the liver edge (series 6, image 74). Similar moderate volume of mixed density perihepatic free fluid including between the liver and the diaphragm. Cholecystectomy clips in the gallbladder fossa, and along the posterior liver edge. Liver enhancement is within normal limits. No convincing contrast extravasation on the arterial or portal venous phase images. Pancreas: Negative. Spleen: Negative aside from perisplenic fluid. Adrenals/Urinary Tract: Normal adrenal glands. Symmetric renal enhancement. Nonobstructed kidneys. Decompressed ureters. Diminutive and unremarkable bladder. Stomach/Bowel: Decompressed large bowel from the ascending colon distally. Gas-filled cecum is located anteriorly and may beyond a lax mesentery. Appendix remains normal on series 17, image 57. Decompressed terminal ileum. No dilated small bowel. Decompressed stomach and duodenum. No free air. However, there is widely scattered free fluid in the bilateral abdomen including the small bowel mesentery and both gutters, with complex fluid density. There are mild postoperative changes to the ventral abdominal wall including the umbilicus with a small volume of abdominal wall gas (including along the undersurface of the right rectus muscle on series 6, image 158. No abdominal wall fluid collection. Lymphatic: No lymphadenopathy. Reproductive: Negative. Other: Moderate volume of layering fluid in the pelvis and lower peritoneal cavity with complex fluid density. Musculoskeletal: No acute osseous abnormality identified. Probable benign right femoral head bone island. IMPRESSION: 1. Positive for a moderate volume of Hemoperitoneum throughout the abdomen and pelvis,  including a gallbladder shaped hematoma within the gallbladder fossa. No active contrast extravasation identified to indicate the source of bleeding. 2. Small layering pleural effusions with bilateral lung base atelectasis. 3. Postoperative changes to the ventral abdominal wall with no adverse features. 4. No bowel obstruction. Normal appendix. Electronically Signed: By: Odessa Fleming M.D. On: 01/26/2021 11:53    Anti-infectives: Anti-infectives (From admission, onward)    Start     Dose/Rate Route Frequency Ordered Stop   01/26/21 1200  piperacillin-tazobactam (ZOSYN) IVPB 3.375 g        3.375 g 12.5 mL/hr over 240 Minutes Intravenous Every 8 hours 01/26/21 1003     01/25/21 1000  ceFAZolin (ANCEF) IVPB 2g/100 mL premix        2 g 200 mL/hr over 30 Minutes Intravenous  Once 01/25/21 0951 01/25/21 1052   01/25/21 0951  ceFAZolin (ANCEF) 2-4 GM/100ML-% IVPB       Note to Pharmacy: Phebe Colla   : cabinet override      01/25/21 0951 01/25/21 1058        Assessment/Plan POD 3 s/p Laparoscopic Cholecystectomy with attempted IOC, 01/26/2021 - Dr. Magnus Ivan for Gangrenous Cholecystitis  - MRCP negative for CBD stone but showed some mild dilatation fo the CBD w/ tapered appearance at the level of the pancreatitic head. IOC unable to be performed intra-op due to the friable nature of the cystic  duct. From discussion with my team, it sounds as though she may have had almost a Mirizzi like picture. Her labs this am show continued elevation of LFT's w/ T. Bili > 7 now. GI planning repeat MRCP - Given her GB was noted to have gangrenous intra-op, cont abx. WBC normalized. - Noted ABL anemia post op. Bleeding scan w/o active extrav. Hgb 11.1 > 8.4 > 6.7 > 2U PRBC > 9.2 > 8.0 > 1U PRBC > 9.9 > 8.6 - Mobilize - Pulm toilet   FEN - NPO for GI recs. Okay for diet from our standpoint after MRCP. IVF per primary  VTE - SCDs, received signel dose of 10mg  Xarelto on 9/19. None currently given abl anemia  ID -  Ancef peri-op. Zosyn 9/21 >> Afebrile. WBC wnl   Previous UTI per notes Hx of shunt that was placed for hydrocephalus as a child    LOS: 3 days    10/21 , [facility] Surgery 01/28/2021, 8:01 AM Please see Amion for pager number during day hours 7:00am-4:30pm

## 2021-01-28 NOTE — Progress Notes (Signed)
HD#3 SUBJECTIVE:  Patient Summary: Martha Hall is a 25 y.o. 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  Surgery revealed gallbladder to be gangrenous and necrotic. Overnight into 09/21, Hgb dropped from 12.3 to 6.7.  She was given 2 units RBCs. No surgical interventions deemed necessary.  An additional unit given 09/22, due to Hbg drop from 9.2 to 8.0 on hospital day 2. MRCP completed 09/23, hgb stable on hospital day 3.       Overnight Events: No overnight events, she has remained tachycardic.  Blood pressure has been normotensive, but on the lower end at 107/47.   OBJECTIVE:  Vital Signs: Vitals:   01/27/21 1535 01/27/21 2336 01/28/21 0000 01/28/21 0405  BP: 121/69 (!) 110/57 (!) 107/47 (!) 112/59  Pulse: (!) 107 (!) 121 (!) 113 (!) 105  Resp: _0 Temp: 98.8 F (37.1 C) 98.4 F (36.9 C)  98.3 F (36.8 C)  TempSrc: Oral Oral  Oral  SpO2: 98% 94% 99% 99%  Weight:      Height:       Supplemental O2: Room Air SpO2: 99 % O2 Flow Rate (L/min): 2 L/min  Filed Weights   01/25/21 0421 01/25/21 0934 01/27/21 0400  Weight: 101.4 kg 101.2 kg 110.4 kg     Intake/Output Summary (Last 24 hours) at 01/28/2021 0555 Last data filed at 01/28/2021 0000 Gross per 24 hour  Intake 2820 ml  Output --  Net 2820 ml   Net IO Since Admission: 7,819.69 mL [01/28/21 0555]  Physical Exam: General: Alert, sitting in bed comfortable HENT: NCAT Eyes: Scleral icterus present, conjunctiva clear CV: Tachycardic, no murmurs, rubs, or gallops Pulm: Clear to auscultation bilaterally, pulmonary effort normal GI: Mild tenderness present in the right upper quadrant, bandage clean and dry over laparoscopic port MSK: No edema in lower extremities bilaterally, able to ambulate without difficulty Skin: Warm and dry Psych: Normal mood and  affect, states she would like to see her son soon  Patient Lines/Drains/Airways Status     Active Line/Drains/Airways     Name Placement date Placement time Site Days   Peripheral IV 01/25/21 20 G Anterior;Left Wrist 01/25/21  --  Wrist  3   Incision (Closed) 01/25/21 Abdomen Other (Comment) 01/25/21  1054  -- 3   Incision - 4 Ports Abdomen 1: Umbilicus 2: Mid;Upper 3: Right;Medial 4: Right;Lateral 01/25/21  1049  -- 3            Pertinent Labs: CBC Latest Ref Rng & Units 01/28/2021 01/27/2021 01/27/2021  WBC 4.0 - 10.5 K/uL 6.9 - 10.9(H)  Hemoglobin 12.0 - 15.0 g/dL 8.6(L) 9.9(L) 8.0(L)  Hematocrit 36.0 - 46.0 % 25.1(L) 29.3(L) 24.1(L)  Platelets 150 - 400 K/uL 195 - 229    CMP Latest Ref Rng & Units 01/28/2021 01/27/2021 01/27/2021  Glucose 70 - 99 mg/dL 99 - 97  BUN 6 - 20 mg/dL 8 - 10  Creatinine 0.44 - 1.00 mg/dL 0.83 - 1.00  Sodium 135 - 145 mmol/L 136 - 136  Potassium 3.5 - 5.1 mmol/L 5.0 - 5.0  Chloride 98 - 111 mmol/L 107 - 105  CO2 22 - 32 mmol/L 23 - 22  Calcium 8.9 - 10.3 mg/dL 8.4(L) - 8.3(L)  Total Protein 6.5 - 8.1 g/dL 5.0(L) - 5.3(L)  Total Bilirubin 0.3 - 1.2 mg/dL 7.1(H) 6.7(H) 6.1(H)  Alkaline Phos  38 - 126 U/L 144(H) - 143(H)  AST 15 - 41 U/L 152(H) - 188(H)  ALT 0 - 44 U/L 228(H) - 288(H)    No results for input(s): GLUCAP in the last 72 hours.   Pertinent Imaging: No results found.  ASSESSMENT/PLAN:  Assessment: Active Problems:   Abdominal pain   Cholelithiasis  Martha Hall is a 25 y.o. 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  Surgery revealed gallbladder to be gangrenous and necrotic. Overnight into 09/21, Hgb dropped from 12.3 to 6.7.  She was given 2 units RBCs. No surgical interventions deemed necessary.  An additional unit given 09/22, due to Hbg drop from 9.2 to 8.0 on hospital  day 2. MRCP completed 09/23, hgb stable on hospital day 3.     Plan: #Acute blood loss anemia s/p laparoscopic cholecystectomy Patient has received 3 units of blood.  On lab evaluation today her hemoglobin decreased from 9.9-8.6, but on repeat check it remained stable at 9.  Some of hemoglobin decrease is likely dilutional in the setting of fluid resuscitation.  -Follow CBC tomorrow morning -Surgery following, no further surgical interventions planned at this time -Discontinued Zosyn due to patient remaining afebrile with stable white blood cell count -Patient remains tachycardic today with heart rate in the 100s, continuing with fluid resuscitation milliliters per hour.  #Concern for choledocholithiasis Patient underwent an MRCP this morning.  Following MRCP, concern for choledocholithiasis increased due to increased biliary duct distention.  Infrahepatic hematoma and small volume hematoma in gallbladder fossa appears similar to previous imaging. ERCP planned for tomorrow morning.  On lab evaluation.  T bili has increased from 6.7-7.1 today.  Indirect bilirubin at 3.8 and direct bilirubin 3.3. AST/ALT/ Alk phos downtrending at 152/228/144.  -GI following, ERCP tomorrow morning -NPO after midnight  #Acute Kidney Injury-resolved Creatinine improved to 0.83 today.    Best Practice: Diet: Regular diet, NPO at midnight for ERCP tomorrow IVF: Fluids: 0.45NS, Rate:  100 cc/hr x 1 hrs VTE:  Code: Full AB: Discontinued Zosyn DISPO: Anticipated discharge in 3-5 days to Home pending Medical stability following ERCP.  Signature: Christiana Fuchs, D.O. Internal Medicine Resident, PGY-1 [facility]  Pager: [phone] 5:55 AM, 01/28/2021   Please contact the on call pager after 5 pm and on weekends at [phone].

## 2021-01-28 NOTE — Plan of Care (Signed)

## 2021-01-28 NOTE — H&P (View-Only) (Signed)
Patient Name: Martha Hall Date of Encounter: 01/28/2021, 3:06 PM    Subjective  8/10 abd pain Tol clears   Objective  BP (!) 157/88 (BP Location: Right Arm)   Pulse 100   Temp 98.9 F (37.2 C)   Resp 18   Ht 5\' 1"  (1.549 m)   Wt 108.3 kg   LMP 01/17/2021 (Approximate)   SpO2 98%   BMI 45.11 kg/m  Obese, icteric NAD   Recent Labs  Lab 01/24/21 0403 01/24/21 0820 01/25/21 0541 01/26/21 01/28/21 01/26/21 1427 01/27/21 0208 01/27/21 0919 01/27/21 1528 01/28/21 0254  AST 142*  --  153* 192*  --  188*  --   --  152*  ALT 456*  --  352* 334*  --  288*  --   --  228*  ALKPHOS 158*  --  157* 158*  --  143*  --   --  144*  BILITOT 3.8*   < > 3.6* 4.6*  --  6.1* 6.7*  --  7.1*  PROT 6.8  --  6.4* 5.9*  --  5.3*  --   --  5.0*  ALBUMIN 3.4*  --  3.1* 2.9*  --  2.7*  --   --  2.5*  INR  --   --   --   --  1.1  --   --  1.1  --    < > = values in this interval not displayed.   CBC Latest Ref Rng & Units 01/28/2021 01/28/2021 01/27/2021  WBC 4.0 - 10.5 K/uL 7.6 6.9 -  Hemoglobin 12.0 - 15.0 g/dL 9.0(L) 8.6(L) 9.9(L)  Hematocrit 36.0 - 46.0 % 27.5(L) 25.1(L) 29.3(L)  Platelets 150 - 400 K/uL 230 195 -    MR 3D Recon At Scanner CLINICAL DATA:  Jaundice in a 25 year old female, history of reported gangrenous cholecystitis, post cholecystectomy with attempted cholangiogram at the time of surgery which was unsuccessful in the setting of gangrenous cholecystitis.  EXAM: MRI ABDOMEN WITHOUT AND WITH CONTRAST (INCLUDING MRCP)  TECHNIQUE: Multiplanar multisequence MR imaging of the abdomen was performed both before and after the administration of intravenous contrast. Heavily T2-weighted images of the biliary and pancreatic ducts were obtained, and three-dimensional MRCP images were rendered by post processing.  CONTRAST:  16mL GADAVIST GADOBUTROL 1 MMOL/ML IV SOLN  COMPARISON:  January 24, 2021 preoperative assessment.  FINDINGS: Lower chest: Basilar atelectasis  and small effusions, lung bases not well assessed by MRI.  Hepatobiliary: Hematoma surrounding the liver inferior is largely similar to the prior study. Decreased hematoma and hemoperitoneum along the margin of the lateral and superior liver. Surgical clips in the porta hepatis compatible with cholecystectomy. Mild hepatic steatosis. No visible hepatic lesion. Hepatic arterial supply derive from celiac axis and classic fashion.  Biliary duct distension increased since previous imaging of the hepatic duct which is approximately 10 mm as compared to 8 mm on the prior study.  MRCP sequences are markedly limited due to motion and susceptibility artifact. Axial high-resolution single shot images display dependent low signal and potential small rounded filling defects most suggestive of several small stones in the distal common bile duct. (Image 26/6) also on image 25/6).  Pancreas: Pancreas with normal intrinsic T1 signal. No signs of adjacent inflammation.  Spleen:  Normal size and contour without visible lesion.  Adrenals/Urinary Tract: Normal adrenal glands. Symmetric renal enhancement. No hydronephrosis.  Stomach/Bowel: Unremarkable to the extent evaluated on abdominal MRI not performed for bowel evaluation.  Vascular/Lymphatic: No pathologically enlarged  lymph nodes identified. No abdominal aortic aneurysm demonstrated.  Other: No significant change in the infrahepatic hematoma/hemoperitoneum. Ovoid shaped area (image 85/1300 6.7 x 4.8 cm previously 6.9 x 4.9 cm.  RIGHT subhepatic hematoma 7.5 x 4.6 cm, when measured in a similar fashion 7.9 x 4.3 cm.  Lesser sac/gastrohepatic hematoma is similar. Hemoperitoneum along the margin of the RIGHT hemi liver has diminished.  Small hemoperitoneum in the LEFT hemiabdomen is grossly similar. Susceptibility effect in the porta hepatis related to hematoma does limit assessment.  Musculoskeletal: No suspicious bone lesions  identified.  IMPRESSION: Choledocholithiasis is suspected though there is limited assessment on dedicated MRCP sequences. Increased biliary duct distension is present since previous imaging, it is possible the blood products/clot in the biliary tree could have a similar appearance.  Biliary duct distension increased since previous imaging of the prior study.  Infrahepatic hematoma with similar appearance along the inferior margin of the LEFT and RIGHT hemi liver with slightly diminished blood products along the lateral margin of the RIGHT hemi liver.  Small volume hemoperitoneum in the LEFT abdomen is similar.  Mild hepatic steatosis.  Basilar atelectasis and small effusions, lung bases not well assessed by MRI. Correlate with any signs of pulmonary infection.  Electronically Signed   By: Donzetta Kohut M.D.   On: 01/28/2021 13:38   Assessment and Plan  Jaundice w/ biliary obstruction and suspected choledocholithiasis  Plan for ERCP tomorrow  The risks and benefits as well as alternatives of endoscopic procedure(s) have been discussed and reviewed. All questions answered. The patient agrees to proceed.Risk of pancreatitis reviewed.  Mom present for discussion.  We reviewed pathophysiology of her problems, hepatic steatosis, life after cholecystectomy.  Iva Boop, MD, [facility] [facility] 01/28/2021 3:09 PM     Iva Boop, MD, [facility] 01/28/2021 3:06 PM

## 2021-01-28 NOTE — Progress Notes (Signed)
Pharmacy Antibiotic Note  Martha Hall is a 25 y.o. female admitted on 01/24/2021 with  intra-abdominal infection .  Pharmacy has been consulted for Zosyn dosing.  Patient s/p MRCP which was negative for CBD stone but GB was noted to be gangrenous. Empiric antibiotics were initiated. Now s/p cholecystectomy w/ attempted [facility] 9/21. WBC improving, now afebrile.  Plan: - Continue Zosyn 3.375g IV q8h (4 hour infusion). (D3/__) - F/u EOT and clinical signs of improvement  Height: 5\' 1"  (154.9 cm) Weight: 110.4 kg (243 lb 6.2 oz) IBW/kg (Calculated) : 47.8  Temp (24hrs), Avg:98.6 F (37 C), Min:97.9 F (36.6 C), Max:99 F (37.2 C)  Recent Labs  Lab 01/24/21 0403 01/25/21 0541 01/25/21 1640 01/26/21 0633 01/26/21 1121 01/27/21 0208 01/27/21 0919 01/28/21 0254 01/28/21 0427  WBC 4.8 4.8   < > 13.2* 7.3 9.9 10.9*  --  6.9  CREATININE 0.84 0.83  --  1.33*  --  1.00  --  0.83  --    < > = values in this interval not displayed.    Estimated Creatinine Clearance: 121.2 mL/min (by C-G formula based on SCr of 0.83 mg/dL).    Allergies  Allergen Reactions   Metoclopramide Itching    Antimicrobials this admission: Cefazolin 9/20 x1 Zosyn 9/21 >>     Microbiology results: 9/19 MRSA PCR: negative   Thank you for allowing pharmacy to be a part of this patient's care.  10/19, PharmD Clinical Pharmacist

## 2021-01-29 ENCOUNTER — Inpatient Hospital Stay (HOSPITAL_COMMUNITY): Payer: Medicaid Other

## 2021-01-29 ENCOUNTER — Inpatient Hospital Stay (HOSPITAL_COMMUNITY): Payer: Medicaid Other | Admitting: Certified Registered Nurse Anesthetist

## 2021-01-29 ENCOUNTER — Encounter (HOSPITAL_COMMUNITY)
Admission: EM | Disposition: A | Discharge status: Home / Self Care | Payer: Self-pay | Source: Home / Self Care | Attending: Internal Medicine

## 2021-01-29 ENCOUNTER — Encounter (HOSPITAL_COMMUNITY): Payer: Self-pay | Admitting: Internal Medicine

## 2021-01-29 DIAGNOSIS — Z888 Allergy status to other drugs, medicaments and biological substances status: Secondary | ICD-10-CM | POA: Diagnosis not present

## 2021-01-29 DIAGNOSIS — K831 Obstruction of bile duct: Secondary | ICD-10-CM | POA: Diagnosis not present

## 2021-01-29 DIAGNOSIS — K219 Gastro-esophageal reflux disease without esophagitis: Secondary | ICD-10-CM | POA: Diagnosis not present

## 2021-01-29 DIAGNOSIS — D62 Acute posthemorrhagic anemia: Secondary | ICD-10-CM | POA: Diagnosis not present

## 2021-01-29 DIAGNOSIS — K8051 Calculus of bile duct without cholangitis or cholecystitis with obstruction: Secondary | ICD-10-CM | POA: Diagnosis not present

## 2021-01-29 DIAGNOSIS — D563 Thalassemia minor: Secondary | ICD-10-CM | POA: Diagnosis not present

## 2021-01-29 DIAGNOSIS — E669 Obesity, unspecified: Secondary | ICD-10-CM | POA: Diagnosis not present

## 2021-01-29 DIAGNOSIS — K8050 Calculus of bile duct without cholangitis or cholecystitis without obstruction: Secondary | ICD-10-CM | POA: Diagnosis not present

## 2021-01-29 DIAGNOSIS — I959 Hypotension, unspecified: Secondary | ICD-10-CM | POA: Diagnosis not present

## 2021-01-29 DIAGNOSIS — K661 Hemoperitoneum: Secondary | ICD-10-CM | POA: Diagnosis not present

## 2021-01-29 DIAGNOSIS — Z6841 Body Mass Index (BMI) 40.0 and over, adult: Secondary | ICD-10-CM | POA: Diagnosis not present

## 2021-01-29 DIAGNOSIS — K82A1 Gangrene of gallbladder in cholecystitis: Secondary | ICD-10-CM | POA: Diagnosis not present

## 2021-01-29 DIAGNOSIS — N179 Acute kidney failure, unspecified: Secondary | ICD-10-CM | POA: Diagnosis not present

## 2021-01-29 DIAGNOSIS — Z20822 Contact with and (suspected) exposure to covid-19: Secondary | ICD-10-CM | POA: Diagnosis not present

## 2021-01-29 DIAGNOSIS — K8063 Calculus of gallbladder and bile duct with acute cholecystitis with obstruction: Secondary | ICD-10-CM | POA: Diagnosis not present

## 2021-01-29 DIAGNOSIS — R17 Unspecified jaundice: Secondary | ICD-10-CM | POA: Diagnosis not present

## 2021-01-29 HISTORY — PX: ERCP: SHX5425

## 2021-01-29 HISTORY — PX: SPHINCTEROTOMY: SHX5544

## 2021-01-29 HISTORY — PX: REMOVAL OF STONES: SHX5545

## 2021-01-29 LAB — CBC
HCT: 25.8 % — ABNORMAL LOW (ref 36.0–46.0)
Hemoglobin: 8.7 g/dL — ABNORMAL LOW (ref 12.0–15.0)
MCH: 29.3 pg (ref 26.0–34.0)
MCHC: 33.7 g/dL (ref 30.0–36.0)
MCV: 86.9 fL (ref 80.0–100.0)
Platelets: 231 10*3/uL (ref 150–400)
RBC: 2.97 MIL/uL — ABNORMAL LOW (ref 3.87–5.11)
RDW: 15.7 % — ABNORMAL HIGH (ref 11.5–15.5)
WBC: 6.2 10*3/uL (ref 4.0–10.5)
nRBC: 0 % (ref 0.0–0.2)

## 2021-01-29 LAB — COMPREHENSIVE METABOLIC PANEL
ALT: 240 U/L — ABNORMAL HIGH (ref 0–44)
AST: 165 U/L — ABNORMAL HIGH (ref 15–41)
Albumin: 2.4 g/dL — ABNORMAL LOW (ref 3.5–5.0)
Alkaline Phosphatase: 157 U/L — ABNORMAL HIGH (ref 38–126)
Anion gap: 8 (ref 5–15)
BUN: 5 mg/dL — ABNORMAL LOW (ref 6–20)
CO2: 23 mmol/L (ref 22–32)
Calcium: 8.6 mg/dL — ABNORMAL LOW (ref 8.9–10.3)
Chloride: 106 mmol/L (ref 98–111)
Creatinine, Ser: 0.83 mg/dL (ref 0.44–1.00)
GFR, Estimated: >60 mL/min (ref >60)
Glucose, Bld: 93 mg/dL (ref 70–99)
Potassium: 3.7 mmol/L (ref 3.5–5.1)
Sodium: 137 mmol/L (ref 135–145)
Total Bilirubin: 7.1 mg/dL — ABNORMAL HIGH (ref 0.3–1.2)
Total Protein: 5.5 g/dL — ABNORMAL LOW (ref 6.5–8.1)

## 2021-01-29 SURGERY — ERCP, WITH INTERVENTION IF INDICATED
Anesthesia: General

## 2021-01-29 MED ORDER — PHENYLEPHRINE 40 MCG/ML (10ML) SYRINGE FOR IV PUSH (FOR BLOOD PRESSURE SUPPORT)
PREFILLED_SYRINGE | INTRAVENOUS | Status: DC | PRN
  Administered 2021-01-29 (×2): 120 ug via INTRAVENOUS

## 2021-01-29 MED ORDER — CIPROFLOXACIN IN D5W 400 MG/200ML IV SOLN
INTRAVENOUS | Status: AC
  Filled 2021-01-29: qty 200

## 2021-01-29 MED ORDER — MIDAZOLAM HCL 2 MG/2ML IJ SOLN
INTRAMUSCULAR | Status: AC
  Filled 2021-01-29: qty 2

## 2021-01-29 MED ORDER — ONDANSETRON HCL 4 MG/2ML IJ SOLN
INTRAMUSCULAR | Status: DC | PRN
  Administered 2021-01-29: 4 mg via INTRAVENOUS

## 2021-01-29 MED ORDER — LACTATED RINGERS IV SOLN
INTRAVENOUS | Status: AC | PRN
  Administered 2021-01-29: 10 mL/h via INTRAVENOUS

## 2021-01-29 MED ORDER — FENTANYL CITRATE (PF) 100 MCG/2ML IJ SOLN
INTRAMUSCULAR | Status: AC
  Filled 2021-01-29: qty 2

## 2021-01-29 MED ORDER — HYDROMORPHONE HCL 1 MG/ML IJ SOLN
1.0000 mg | INTRAMUSCULAR | Status: DC | PRN
  Administered 2021-01-29 – 2021-01-30 (×3): 1 mg via INTRAVENOUS
  Filled 2021-01-29 (×2): qty 1

## 2021-01-29 MED ORDER — INDOMETHACIN 50 MG RE SUPP
RECTAL | Status: AC
  Filled 2021-01-29: qty 2

## 2021-01-29 MED ORDER — FENTANYL CITRATE (PF) 250 MCG/5ML IJ SOLN
INTRAMUSCULAR | Status: DC | PRN
  Administered 2021-01-29: 100 ug via INTRAVENOUS

## 2021-01-29 MED ORDER — SUGAMMADEX SODIUM 200 MG/2ML IV SOLN
INTRAVENOUS | Status: DC | PRN
  Administered 2021-01-29: 200 mg via INTRAVENOUS

## 2021-01-29 MED ORDER — PROPOFOL 10 MG/ML IV BOLUS
INTRAVENOUS | Status: DC | PRN
  Administered 2021-01-29: 150 mg via INTRAVENOUS

## 2021-01-29 MED ORDER — SODIUM CHLORIDE 0.9 % IV SOLN
INTRAVENOUS | Status: AC
  Filled 2021-01-29: qty 8

## 2021-01-29 MED ORDER — OXYCODONE HCL 5 MG PO TABS
5.0000 mg | ORAL_TABLET | ORAL | Status: DC
  Administered 2021-01-29 – 2021-01-30 (×4): 5 mg via ORAL
  Filled 2021-01-29 (×4): qty 1

## 2021-01-29 MED ORDER — DEXAMETHASONE SODIUM PHOSPHATE 10 MG/ML IJ SOLN
INTRAMUSCULAR | Status: DC | PRN
  Administered 2021-01-29: 4 mg via INTRAVENOUS

## 2021-01-29 MED ORDER — INDOMETHACIN 50 MG RE SUPP
100.0000 mg | Freq: Once | RECTAL | Status: DC
  Filled 2021-01-29: qty 2

## 2021-01-29 MED ORDER — ROCURONIUM BROMIDE 10 MG/ML (PF) SYRINGE
PREFILLED_SYRINGE | INTRAVENOUS | Status: DC | PRN
  Administered 2021-01-29: 50 mg via INTRAVENOUS

## 2021-01-29 MED ORDER — INDOMETHACIN 50 MG RE SUPP
RECTAL | Status: DC | PRN
  Administered 2021-01-29: 100 mg via RECTAL

## 2021-01-29 MED ORDER — SODIUM CHLORIDE 0.9 % IV SOLN
INTRAVENOUS | Status: DC | PRN
  Administered 2021-01-29: 20 mL

## 2021-01-29 MED ORDER — MIDAZOLAM HCL 2 MG/2ML IJ SOLN
INTRAMUSCULAR | Status: DC | PRN
  Administered 2021-01-29: 1 mg via INTRAVENOUS

## 2021-01-29 MED ORDER — GLUCAGON HCL RDNA (DIAGNOSTIC) 1 MG IJ SOLR
INTRAMUSCULAR | Status: AC
  Filled 2021-01-29: qty 1

## 2021-01-29 MED ORDER — LIDOCAINE 2% (20 MG/ML) 5 ML SYRINGE
INTRAMUSCULAR | Status: DC | PRN
  Administered 2021-01-29: 20 mg via INTRAVENOUS

## 2021-01-29 NOTE — Progress Notes (Signed)
Day of Surgery  Subjective: CC: Sleepy after ERCP this am. Reports no current abdominal pain. Tolerated liquids yesterday without n/v. Passing flatus. Voiding. Mobilized in room yesterday.   Objective: Vital signs in last 24 hours: Temp:  [98 F (36.7 C)-99.7 F (37.6 C)] 98.5 F (36.9 C) (09/24 1041) Pulse Rate:  [73-109] 73 (09/24 1041) Resp:  [15-29] 18 (09/24 1041) BP: (108-162)/(68-95) 162/70 (09/24 1041) SpO2:  [91 %-99 %] 96 % (09/24 1041) Weight:  [106.6 kg-108.7 kg] 106.6 kg (09/24 0753) Last BM Date: 01/25/21  Intake/Output from previous day: 09/23 0701 - 09/24 0700 In: -  Out: 750 [Urine:750] Intake/Output this shift: Total I/O In: 550 [I.V.:500; IV Piggyback:50] Out: 0   PE: Gen:  Alert, NAD, pleasant HEENT: EOM's intact, pupils equal and round Card:  Reg Pulm:  Normal rate and effort  Abd: Soft, ND, NT +BS. Right lower lateral laparoscopic incision site with stitch in place that is c/d/I without any signs of further bleeding. Otherwise, remaining incisions with glue intact appears well and are without drainage, bleeding, or signs of infection Psych: A&Ox3  Skin: no rashes noted, warm and dry  Lab Results:  Recent Labs    01/28/21 1300 01/29/21 0312  WBC 7.6 6.2  HGB 9.0* 8.7*  HCT 27.5* 25.8*  PLT 230 231   BMET Recent Labs    01/28/21 0254 01/29/21 0312  NA 136 137  K 5.0 3.7  CL 107 106  CO2 23 23  GLUCOSE 99 93  BUN 8 5*  CREATININE 0.83 0.83  CALCIUM 8.4* 8.6*   PT/INR Recent Labs    01/26/21 1427 01/27/21 1528  LABPROT 14.0 13.7  INR 1.1 1.1   CMP     Component Value Date/Time   NA 137 01/29/2021 0312   K 3.7 01/29/2021 0312   CL 106 01/29/2021 0312   CO2 23 01/29/2021 0312   GLUCOSE 93 01/29/2021 0312   BUN 5 (L) 01/29/2021 0312   CREATININE 0.83 01/29/2021 0312   CALCIUM 8.6 (L) 01/29/2021 0312   PROT 5.5 (L) 01/29/2021 0312   ALBUMIN 2.4 (L) 01/29/2021 0312   AST 165 (H) 01/29/2021 0312   ALT 240 (H)  01/29/2021 0312   ALKPHOS 157 (H) 01/29/2021 0312   BILITOT 7.1 (H) 01/29/2021 0312   GFRNONAA >60 01/29/2021 0312   GFRAA >60 11/16/2019 0950   Lipase     Component Value Date/Time   LIPASE 31 01/24/2021 0403    Studies/Results: MR 3D Recon At Scanner  Result Date: 01/28/2021 CLINICAL DATA:  Jaundice in a 25 year old female, history of reported gangrenous cholecystitis, post cholecystectomy with attempted cholangiogram at the time of surgery which was unsuccessful in the setting of gangrenous cholecystitis. EXAM: MRI ABDOMEN WITHOUT AND WITH CONTRAST (INCLUDING MRCP) TECHNIQUE: Multiplanar multisequence MR imaging of the abdomen was performed both before and after the administration of intravenous contrast. Heavily T2-weighted images of the biliary and pancreatic ducts were obtained, and three-dimensional MRCP images were rendered by post processing. CONTRAST:  24mL GADAVIST GADOBUTROL 1 MMOL/ML IV SOLN COMPARISON:  January 24, 2021 preoperative assessment. FINDINGS: Lower chest: Basilar atelectasis and small effusions, lung bases not well assessed by MRI. Hepatobiliary: Hematoma surrounding the liver inferior is largely similar to the prior study. Decreased hematoma and hemoperitoneum along the margin of the lateral and superior liver. Surgical clips in the porta hepatis compatible with cholecystectomy. Mild hepatic steatosis. No visible hepatic lesion. Hepatic arterial supply derive from celiac axis and classic fashion. Biliary duct  distension increased since previous imaging of the hepatic duct which is approximately 10 mm as compared to 8 mm on the prior study. MRCP sequences are markedly limited due to motion and susceptibility artifact. Axial high-resolution single shot images display dependent low signal and potential small rounded filling defects most suggestive of several small stones in the distal common bile duct. (Image 26/6) also on image 25/6). Pancreas: Pancreas with normal intrinsic  T1 signal. No signs of adjacent inflammation. Spleen:  Normal size and contour without visible lesion. Adrenals/Urinary Tract: Normal adrenal glands. Symmetric renal enhancement. No hydronephrosis. Stomach/Bowel: Unremarkable to the extent evaluated on abdominal MRI not performed for bowel evaluation. Vascular/Lymphatic: No pathologically enlarged lymph nodes identified. No abdominal aortic aneurysm demonstrated. Other: No significant change in the infrahepatic hematoma/hemoperitoneum. Ovoid shaped area (image 85/1300 6.7 x 4.8 cm previously 6.9 x 4.9 cm. RIGHT subhepatic hematoma 7.5 x 4.6 cm, when measured in a similar fashion 7.9 x 4.3 cm. Lesser sac/gastrohepatic hematoma is similar. Hemoperitoneum along the margin of the RIGHT hemi liver has diminished. Small hemoperitoneum in the LEFT hemiabdomen is grossly similar. Susceptibility effect in the porta hepatis related to hematoma does limit assessment. Musculoskeletal: No suspicious bone lesions identified. IMPRESSION: Choledocholithiasis is suspected though there is limited assessment on dedicated MRCP sequences. Increased biliary duct distension is present since previous imaging, it is possible the blood products/clot in the biliary tree could have a similar appearance. Biliary duct distension increased since previous imaging of the prior study. Infrahepatic hematoma with similar appearance along the inferior margin of the LEFT and RIGHT hemi liver with slightly diminished blood products along the lateral margin of the RIGHT hemi liver. Small volume hemoperitoneum in the LEFT abdomen is similar. Mild hepatic steatosis. Basilar atelectasis and small effusions, lung bases not well assessed by MRI. Correlate with any signs of pulmonary infection. Electronically Signed   By: Donzetta Kohut M.D.   On: 01/28/2021 13:38   DG ERCP BILIARY & PANCREATIC DUCTS  Result Date: 01/29/2021 CLINICAL DATA:  25 year old female with choledocholithiasis EXAM: ERCP MRCP  01/28/2021 TECHNIQUE: Multiple spot images obtained with the fluoroscopic device and submitted for interpretation post-procedure. FLUOROSCOPY TIME:  Fluoroscopy Time:  3 minutes 14 seconds Radiation Exposure Index (if provided by the fluoroscopic device): 85.75 mGy Number of Acquired Spot Images: 0 COMPARISON:  MRCP dated 01/28/2021 FINDINGS: A total of 7 intraoperative saved images are submitted for review. The images demonstrate a flexible duodenal scope in the descending duodenum with wire cannulation of the common bile duct. Cholangiogram demonstrates filling defects in the distal common bile duct most consistent with choledocholithiasis. There is mild biliary ductal dilatation as well. Subsequent images demonstrate sphincterotomy and balloon sweeping of the common duct. Filling defects are no longer visible on the final image. IMPRESSION: 1. Choledocholithiasis. 2. ERCP with sphincterotomy and balloon sweeping of the common duct. These images were submitted for radiologic interpretation only. Please see the procedural report for the amount of contrast and the fluoroscopy time utilized. Electronically Signed   By: Malachy Moan M.D.   On: 01/29/2021 09:32   MR ABDOMEN MRCP W WO CONTAST  Result Date: 01/28/2021 CLINICAL DATA:  Jaundice in a 25 year old female, history of reported gangrenous cholecystitis, post cholecystectomy with attempted cholangiogram at the time of surgery which was unsuccessful in the setting of gangrenous cholecystitis. EXAM: MRI ABDOMEN WITHOUT AND WITH CONTRAST (INCLUDING MRCP) TECHNIQUE: Multiplanar multisequence MR imaging of the abdomen was performed both before and after the administration of intravenous contrast. Heavily T2-weighted images  of the biliary and pancreatic ducts were obtained, and three-dimensional MRCP images were rendered by post processing. CONTRAST:  70mL GADAVIST GADOBUTROL 1 MMOL/ML IV SOLN COMPARISON:  January 24, 2021 preoperative assessment. FINDINGS:  Lower chest: Basilar atelectasis and small effusions, lung bases not well assessed by MRI. Hepatobiliary: Hematoma surrounding the liver inferior is largely similar to the prior study. Decreased hematoma and hemoperitoneum along the margin of the lateral and superior liver. Surgical clips in the porta hepatis compatible with cholecystectomy. Mild hepatic steatosis. No visible hepatic lesion. Hepatic arterial supply derive from celiac axis and classic fashion. Biliary duct distension increased since previous imaging of the hepatic duct which is approximately 10 mm as compared to 8 mm on the prior study. MRCP sequences are markedly limited due to motion and susceptibility artifact. Axial high-resolution single shot images display dependent low signal and potential small rounded filling defects most suggestive of several small stones in the distal common bile duct. (Image 26/6) also on image 25/6). Pancreas: Pancreas with normal intrinsic T1 signal. No signs of adjacent inflammation. Spleen:  Normal size and contour without visible lesion. Adrenals/Urinary Tract: Normal adrenal glands. Symmetric renal enhancement. No hydronephrosis. Stomach/Bowel: Unremarkable to the extent evaluated on abdominal MRI not performed for bowel evaluation. Vascular/Lymphatic: No pathologically enlarged lymph nodes identified. No abdominal aortic aneurysm demonstrated. Other: No significant change in the infrahepatic hematoma/hemoperitoneum. Ovoid shaped area (image 85/1300 6.7 x 4.8 cm previously 6.9 x 4.9 cm. RIGHT subhepatic hematoma 7.5 x 4.6 cm, when measured in a similar fashion 7.9 x 4.3 cm. Lesser sac/gastrohepatic hematoma is similar. Hemoperitoneum along the margin of the RIGHT hemi liver has diminished. Small hemoperitoneum in the LEFT hemiabdomen is grossly similar. Susceptibility effect in the porta hepatis related to hematoma does limit assessment. Musculoskeletal: No suspicious bone lesions identified. IMPRESSION:  Choledocholithiasis is suspected though there is limited assessment on dedicated MRCP sequences. Increased biliary duct distension is present since previous imaging, it is possible the blood products/clot in the biliary tree could have a similar appearance. Biliary duct distension increased since previous imaging of the prior study. Infrahepatic hematoma with similar appearance along the inferior margin of the LEFT and RIGHT hemi liver with slightly diminished blood products along the lateral margin of the RIGHT hemi liver. Small volume hemoperitoneum in the LEFT abdomen is similar. Mild hepatic steatosis. Basilar atelectasis and small effusions, lung bases not well assessed by MRI. Correlate with any signs of pulmonary infection. Electronically Signed   By: Donzetta Kohut M.D.   On: 01/28/2021 13:38    Anti-infectives: Anti-infectives (From admission, onward)    Start     Dose/Rate Route Frequency Ordered Stop   01/29/21 0800  ampicillin-sulbactam (UNASYN) 1.5 g in sodium chloride 0.9 % 100 mL IVPB        1.5 g 200 mL/hr over 30 Minutes Intravenous  Once 01/28/21 1346 01/29/21 0904   01/26/21 1200  piperacillin-tazobactam (ZOSYN) IVPB 3.375 g  Status:  Discontinued        3.375 g 12.5 mL/hr over 240 Minutes Intravenous Every 8 hours 01/26/21 1003 01/28/21 1011   01/25/21 1000  ceFAZolin (ANCEF) IVPB 2g/100 mL premix        2 g 200 mL/hr over 30 Minutes Intravenous  Once 01/25/21 0951 01/25/21 1052   01/25/21 0951  ceFAZolin (ANCEF) 2-4 GM/100ML-% IVPB       Note to Pharmacy: Phebe Colla   : cabinet override      01/25/21 0951 01/25/21 1058  Assessment/Plan POD 4 s/p Laparoscopic Cholecystectomy with attempted IOC, 01/26/2021 - Dr. Magnus Ivan for Gangrenous Cholecystitis  - s/p ERCP with sphincterotomy and balloon extraction by Dr. Leone Payor for Choledocholithiasis w/ partial obstruction - Abx completed. WBC normalized.  - Noted ABL anemia post op. Bleeding scan w/o active extrav. Hgb  11.1 > 8.4 > 6.7 > 2U PRBC > 9.2 > 8.0 > 1U PRBC > 9.9 > 8.6 > 8.7 and stable - Mobilize - Pulm toilet - Hopefully home tomorrow if doing well.    FEN - On CLD. Can be advanced if tolerates from our standpoint.  VTE - SCDs ID - Ancef peri-op. Zosyn 9/21 >> Afebrile. 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

## 2021-01-29 NOTE — Anesthesia Postprocedure Evaluation (Signed)
Anesthesia Post Note  Patient: Martha Hall  Procedure(s) Performed: ENDOSCOPIC RETROGRADE CHOLANGIOPANCREATOGRAPHY (ERCP) SPHINCTEROTOMY REMOVAL OF STONES     Patient location during evaluation: PACU Anesthesia Type: General Level of consciousness: awake and alert, patient cooperative and oriented Pain management: pain level controlled Vital Signs Assessment: post-procedure vital signs reviewed and stable Respiratory status: spontaneous breathing, nonlabored ventilation and respiratory function stable Cardiovascular status: blood pressure returned to baseline and stable Postop Assessment: no apparent nausea or vomiting Anesthetic complications: no   No notable events documented.  Last Vitals:  Vitals:   01/29/21 0930 01/29/21 0945  BP: (!) 149/85 (!) 152/68  Pulse: 99 83  Resp: (!) 29 (!) 25  Temp: 37.4 C   SpO2: 93% 93%    Last Pain:  Vitals:   01/29/21 0930  TempSrc:   PainSc: 0-No pain                 Mauro Arps,E. Aidah Forquer

## 2021-01-29 NOTE — Anesthesia Procedure Notes (Signed)
Procedure Name: Intubation Date/Time: 01/29/2021 8:22 AM Performed by: Inda Coke, CRNA Pre-anesthesia Checklist: Patient identified, Emergency Drugs available, Suction available and Patient being monitored Patient Re-evaluated:Patient Re-evaluated prior to induction Oxygen Delivery Method: Circle System Utilized Preoxygenation: Pre-oxygenation with 100% oxygen Induction Type: IV induction Ventilation: Mask ventilation without difficulty Laryngoscope Size: Mac and 3 Grade View: Grade I Tube type: Oral Tube size: 7.0 mm Number of attempts: 1 Airway Equipment and Method: Stylet and Oral airway Placement Confirmation: ETT inserted through vocal cords under direct vision, positive ETCO2 and breath sounds checked- equal and bilateral Secured at: 21 cm Tube secured with: Tape Dental Injury: Teeth and Oropharynx as per pre-operative assessment

## 2021-01-29 NOTE — Progress Notes (Signed)
Pt's alert and denies any discomfort. At this time pt denies any pain. Visualizing rise and fall of the chest, respirations are 20/min. Will continue to monitor and tx as indicated.

## 2021-01-29 NOTE — Transfer of Care (Signed)
Immediate Anesthesia Transfer of Care Note  Patient: Martha Hall  Procedure(s) Performed: ENDOSCOPIC RETROGRADE CHOLANGIOPANCREATOGRAPHY (ERCP) SPHINCTEROTOMY REMOVAL OF STONES  Patient Location: PACU  Anesthesia Type:General  Level of Consciousness: awake and alert   Airway & Oxygen Therapy: Patient Spontanous Breathing and Patient connected to face mask oxygen  Post-op Assessment: Report given to RN and Post -op Vital signs reviewed and stable  Post vital signs: Reviewed and stable  Last Vitals:  Vitals Value Taken Time  BP    Temp    Pulse    Resp    SpO2      Last Pain:  Vitals:   01/29/21 0753  TempSrc: Oral  PainSc: 6       Patients Stated Pain Goal: 2 (01/26/21 2343)  Complications: No notable events documented.

## 2021-01-29 NOTE — Op Note (Signed)
[facility] Patient Name: Martha Hall Procedure Date : 01/29/2021 MRN: [id number] Attending MD: Iva Boop , MD Date of Birth: 01/14/1996 CSN: [id number] Age: 25 Admit Type: Inpatient Procedure:                ERCP Indications:              Bile duct stone(s) Providers:                Iva Boop, MD, Margaree Mackintosh, RN,                            Faustina Mbumina, Technician Referring MD:              Medicines:                General Anesthesia, Unasyn 1.5 g IV, Indomethacin                            100 mg PR Complications:            No immediate complications. Estimated Blood Loss:     Estimated blood loss was minimal. Procedure:                Pre-Anesthesia Assessment:                           - Prior to the procedure, a History and Physical                            was performed, and patient medications and                            allergies were reviewed. The patient's tolerance of                            previous anesthesia was also reviewed. The risks                            and benefits of the procedure and the sedation                            options and risks were discussed with the patient.                            All questions were answered, and informed consent                            was obtained. Prior Anticoagulants: The patient has                            taken no previous anticoagulant or antiplatelet                            agents. ASA Grade Assessment: III - A patient with  severe systemic disease. After reviewing the risks                            and benefits, the patient was deemed in                            satisfactory condition to undergo the procedure.                           After obtaining informed consent, the scope was                            passed under direct vision. Throughout the                            procedure, the patient's blood pressure,  pulse, and                            oxygen saturations were monitored continuously. The                            TJF-Q180V ([id number] Olympus duodenoscope was                            introduced through the mouth, and used to inject                            contrast into and used to inject contrast into the                            bile duct. The patient tolerated the procedure                            well. The ERCP was accomplished without difficulty. Scope In: Scope Out: Findings:      A scout film of the abdomen was obtained. Surgical clips, consistent       with a previous cholecystectomy, were seen in the area of the right       upper quadrant of the abdomen. The esophagus was successfully intubated       under direct vision. The scope was advanced to a normal major papilla in       the descending duodenum without detailed examination of the pharynx,       larynx and associated structures, and upper GI tract. The upper GI tract       was grossly normal. The papilla was close to he scope and with some       manipulation I was able to get positioning to allow cannulation. Dark       green sludge was A wire was passed via sphincterotome and went into       pancreatic duct so I left that in place. Another wire was used via       sphincterotome and passed into CBD. Pancreatic wire removed. Contrast       injected into bile duct. ? distal filling duct. Given findings at MRCP       (stone) biliary  sphincterotomy was performed. I performed a few balloon       sweeps and a small stone + sludge removed. Occlusion cholangiogram       negative. Note there was slight bleeding at top of sphincterotomy -       treated intially with coag from sphincterotome and stopped. Then some       ooze after balloon sweeps which stopped on its own. Impression:               - Choledocholithiasis with a partial obstruction                            was found. Complete removal was accomplished by                             biliary sphincterotomy and balloon extraction. Recommendation:           - Return patient to hospital ward for ongoing care.                           - Clear liquid diet. Procedure Code(s):        --- Professional ---                           [phone], Endoscopic retrograde                            cholangiopancreatography (ERCP); with removal of                            calculi/debris from biliary/pancreatic duct(s)                           43262, Endoscopic retrograde                            cholangiopancreatography (ERCP); with                            sphincterotomy/papillotomy Diagnosis Code(s):        --- Professional ---                           K80.51, Calculus of bile duct without cholangitis                            or cholecystitis with obstruction CPT copyright 2019 [employer]. All rights reserved. The codes documented in this report are preliminary and upon coder review may  be revised to meet current compliance requirements. Iva Boop, MD 01/29/2021 9:36:48 AM This report has been signed electronically. Number of Addenda: 0

## 2021-01-29 NOTE — Interval H&P Note (Signed)
History and Physical Interval Note:  01/29/2021 7:46 AM  Martha Hall  has presented today for surgery, with the diagnosis of CBD stone.  The various methods of treatment have been discussed with the patient and family. After consideration of risks, benefits and other options for treatment, the patient has consented to  Procedure(s): ENDOSCOPIC RETROGRADE CHOLANGIOPANCREATOGRAPHY (ERCP) (N/A) as a surgical intervention.  The patient's history has been reviewed, patient examined, no change in status, stable for surgery.  I have reviewed the patient's chart and labs.  Questions were answered to the patient's satisfaction.     Stan Head

## 2021-01-29 NOTE — Anesthesia Preprocedure Evaluation (Addendum)
Anesthesia Evaluation  Patient identified by MRN, date of birth, ID band Patient awake    Reviewed: Allergy & Precautions, NPO status , Patient's Chart, lab work & pertinent test results  History of Anesthesia Complications Negative for: history of anesthetic complications  Airway Mallampati: II  TM Distance: >3 FB Neck ROM: Full    Dental  (+) Dental Advisory Given, Teeth Intact   Pulmonary neg pulmonary ROS,    breath sounds clear to auscultation       Cardiovascular negative cardio ROS   Rhythm:Regular Rate:Normal     Neuro/Psych negative neurological ROS     GI/Hepatic Neg liver ROS, GERD  Medicated and Controlled,Markedly elevated LFTs: retained CBD stones s/p cholecystectomy   Endo/Other  neg diabetesMorbid obesity  Renal/GU negative Renal ROS     Musculoskeletal   Abdominal (+) + obese,   Peds  Hematology  (+) Blood dyscrasia (Hb 8.7, s/p transfusion), anemia ,   Anesthesia Other Findings   Reproductive/Obstetrics                            Anesthesia Physical Anesthesia Plan  ASA: 3  Anesthesia Plan: General   Post-op Pain Management:    Induction: Intravenous and Rapid sequence  PONV Risk Score and Plan: 3 and Ondansetron, Dexamethasone and Scopolamine patch - Pre-op  Airway Management Planned: Oral ETT  Additional Equipment: None  Intra-op Plan:   Post-operative Plan: Extubation in OR  Informed Consent: I have reviewed the patients History and Physical, chart, labs and discussed the procedure including the risks, benefits and alternatives for the proposed anesthesia with the patient or authorized representative who has indicated his/her understanding and acceptance.     Dental advisory given  Plan Discussed with: CRNA and Surgeon  Anesthesia Plan Comments:        Anesthesia Quick Evaluation

## 2021-01-29 NOTE — Progress Notes (Signed)
   Subjective:  HD5  Patient evaluated at bedside this afternoon after procedure. She states she is comfortably, no current abdominal pain. Discussed ERCP findings.  Objective:  Vital signs in last 24 hours: Vitals:   01/29/21 1000 01/29/21 1008 01/29/21 1013 01/29/21 1041  BP: (!) 157/95 (!) 162/72 (!) 156/94 (!) 162/70  Pulse: 87 92 86 73  Resp: (!) 24 (!) 23 (!) 27 18  Temp:   98.4 F (36.9 C) 98.5 F (36.9 C)  TempSrc:    Oral  SpO2: 91% 94% 95% 96%  Weight:      Height:       General: Resting comfortably in bed, no acute distress CV: Regular rate, rhythm. No murmurs appreciated Pulm: Normal work of breathing on room air. Clear to auscultation bilaterally. GI: Abdomen soft, non-tender, non-distended. Normoactive bowel sounds. Neuro: Awake, alert, conversing appropriately  Assessment/Plan:  Martha Hall is 25yo person with history of hydrocephalus s/p shunt placement at birth, alpha thalassemia trait admitted 9/19 with acute cholecystitis, now s/p lap chole complicated by acute blood loss anemia and hyperbilirubinemia with ERCP performed today finding choledocholithiasis.   Active Problems:   Abdominal pain   Cholelithiasis   Jaundice   Biliary obstruction   Choledocholithiasis  #Acute cholecystitis POD4 laparoscopic cholecystectomy #Choledocholithiasis  Overnight patient remained stable, no acute events. MRCP yesterday with increased biliary duct distention. ERCP this AM revealed choledocholithiasis without evidence of cholangitis and removal of the stone. Expect LFT's to improve after this. Pain currently controlled. Appreciate GI and surgery's assistance and recommendations. - Advance diet as tolerated - Oxycodone 5mg  q4h - IV dilaudid 1mg  14h PRN - Daily CMP  #Acute blood loss anemia Hgb stable today, 8.7<9. Will continue with IVF throughout today. No signs of acute bleeding on exam. HR has improved, in 70's on exam.  - Continue IVF through today - Daily  CBC  Prior to Admission Living Arrangement: Home Anticipated Discharge Location: Home Barriers to Discharge: medical management Dispo: Anticipated discharge in approximately 1-2 day(s).   , MD 01/29/2021, 12:41 PM Pager: [phone] After 5pm on weekdays and 1pm on weekends: On Call pager [phone]

## 2021-01-30 DIAGNOSIS — R17 Unspecified jaundice: Secondary | ICD-10-CM | POA: Diagnosis not present

## 2021-01-30 DIAGNOSIS — K831 Obstruction of bile duct: Secondary | ICD-10-CM | POA: Diagnosis not present

## 2021-01-30 DIAGNOSIS — R109 Unspecified abdominal pain: Secondary | ICD-10-CM

## 2021-01-30 DIAGNOSIS — K8051 Calculus of bile duct without cholangitis or cholecystitis with obstruction: Secondary | ICD-10-CM | POA: Diagnosis not present

## 2021-01-30 LAB — COMPREHENSIVE METABOLIC PANEL
ALT: 285 U/L — ABNORMAL HIGH (ref 0–44)
AST: 196 U/L — ABNORMAL HIGH (ref 15–41)
Albumin: 2.5 g/dL — ABNORMAL LOW (ref 3.5–5.0)
Alkaline Phosphatase: 166 U/L — ABNORMAL HIGH (ref 38–126)
Anion gap: 10 (ref 5–15)
BUN: 8 mg/dL (ref 6–20)
CO2: 22 mmol/L (ref 22–32)
Calcium: 8.7 mg/dL — ABNORMAL LOW (ref 8.9–10.3)
Chloride: 106 mmol/L (ref 98–111)
Creatinine, Ser: 0.85 mg/dL (ref 0.44–1.00)
GFR, Estimated: >60 mL/min (ref >60)
Glucose, Bld: 77 mg/dL (ref 70–99)
Potassium: 3.9 mmol/L (ref 3.5–5.1)
Sodium: 138 mmol/L (ref 135–145)
Total Bilirubin: 3.3 mg/dL — ABNORMAL HIGH (ref 0.3–1.2)
Total Protein: 5.8 g/dL — ABNORMAL LOW (ref 6.5–8.1)

## 2021-01-30 LAB — CBC
HCT: 26.7 % — ABNORMAL LOW (ref 36.0–46.0)
Hemoglobin: 8.7 g/dL — ABNORMAL LOW (ref 12.0–15.0)
MCH: 28.7 pg (ref 26.0–34.0)
MCHC: 32.6 g/dL (ref 30.0–36.0)
MCV: 88.1 fL (ref 80.0–100.0)
Platelets: 287 10*3/uL (ref 150–400)
RBC: 3.03 MIL/uL — ABNORMAL LOW (ref 3.87–5.11)
RDW: 16 % — ABNORMAL HIGH (ref 11.5–15.5)
WBC: 7.6 10*3/uL (ref 4.0–10.5)
nRBC: 0.4 % — ABNORMAL HIGH (ref 0.0–0.2)

## 2021-01-30 MED ORDER — OXYCODONE HCL 5 MG PO TABS: 5.0000 mg | ORAL_TABLET | Freq: Four times a day (QID) | ORAL | 0 refills | Status: DC | PRN

## 2021-01-30 MED ORDER — KETOROLAC TROMETHAMINE 30 MG/ML IJ SOLN
30.0000 mg | Freq: Four times a day (QID) | INTRAMUSCULAR | Status: DC | PRN
  Administered 2021-01-30 – 2021-01-31 (×2): 30 mg via INTRAVENOUS
  Filled 2021-01-30 (×2): qty 1

## 2021-01-30 MED ORDER — MAGIC MOUTHWASH W/LIDOCAINE: 1.0000 mL | Freq: Four times a day (QID) | ORAL | Status: DC | PRN

## 2021-01-30 MED ORDER — OXYCODONE HCL 5 MG PO TABS
5.0000 mg | ORAL_TABLET | Freq: Four times a day (QID) | ORAL | Status: DC
Start: 2021-01-30 — End: 2021-01-31
  Administered 2021-01-30 – 2021-01-31 (×5): 5 mg via ORAL
  Filled 2021-01-30 (×5): qty 1

## 2021-01-30 MED ORDER — TRAZODONE HCL 50 MG PO TABS: 50.0000 mg | ORAL_TABLET | Freq: Every day | ORAL | Status: DC

## 2021-01-30 NOTE — Progress Notes (Signed)
   Subjective:  HD6  Patient evaluated at bedside this morning. Mentions she is feeling well, no abdominal pain. Pain currently controlled w/ po medications. Discussed advancing diet today, possible d/c today vs tomorrow.  Objective:  Vital signs in last 24 hours: Vitals:   01/29/21 2103 01/30/21 0019 01/30/21 0431 01/30/21 0814  BP: (!) 141/74 135/79 133/72 137/90  Pulse:    (!) 2  Resp: (!) 25   (!) 24  Temp: 99.9 F (37.7 C) 98.8 F (37.1 C) 98 F (36.7 C) 98.1 F (36.7 C)  TempSrc: Oral Oral Oral Oral  SpO2:  95%  93%  Weight:   108.4 kg   Height:       General: Resting comfortably in bed, no acute distress CV: Regular rate, rhythm. No murmurs appreciated Pulm: Normal work of breathing on room air. Clear to auscultation bilaterally Abdomen: Soft, non-tender, non-distended. Normoactive bowel sounds. 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  Active Problems:   Abdominal pain   Cholelithiasis   Jaundice   Biliary obstruction   Choledocholithiasis  #Acute cholecystitis POD5 laparoscopic cholecystectomy #Choledocholithiasis s/p stone removal with ERCP This morning patient is doing well, no acute events overnight. Tbili has improved since stone removal yesterday, 3<7. She has tolerated clear liquid diet well, will advance diet today. Current discharge barriers include pain control off of IV medications, ensure patient is tolerating po, ambulating well, and having appropriate bowel movements. Likely can d/c later today vs tomorrow. Appreciate surgery and GI's assistance and recommendations with this case. - Continue to  advance diet - Oxycodone 5mg  q6h - D/c IV dilaudid - Daily CMP - Ambulate patient  #Acute blood loss anemia Hgb stable today, 8.7<8.7. No signs of further bleeding. Patient's HR in 70's, blood pressure appropriate. No longer needs fluids at this time, will monitor throughout the day. - Daily CBC - No further IVF  Prior to Admission Living Arrangement: Home Anticipated Discharge Location: Home Barriers to Discharge: medical management Dispo: Anticipated discharge in approximately 0-1 day(s).   , MD 01/30/2021, 11:27 AM Pager: [phone] After 5pm on weekdays and 1pm on weekends: On Call pager [phone]

## 2021-01-30 NOTE — Progress Notes (Signed)
1 Day Post-Op  Subjective: CC: Doing well. Some soreness around her incisions that is well controlled on current pain regimen. Tolerating liquids without n/v. Passing flatus. Voiding. Mobilizing in her room without difficulty.   Objective: Vital signs in last 24 hours: Temp:  [98 F (36.7 C)-99.9 F (37.7 C)] 98.1 F (36.7 C) (09/25 0814) Pulse Rate:  [2-99] 2 (09/25 0814) Resp:  [18-31] 24 (09/25 0814) BP: (133-162)/(68-95) 137/90 (09/25 0814) SpO2:  [91 %-96 %] 93 % (09/25 0814) Weight:  [108.4 kg] 108.4 kg (09/25 0431) Last BM Date: 01/25/21  Intake/Output from previous day: 09/24 0701 - 09/25 0700 In: 550 [I.V.:500; IV Piggyback:50] Out: 452 [Urine:451; Stool:1] Intake/Output this shift: No intake/output data recorded.  PE: Gen:  Alert, NAD, pleasant HEENT: EOM's intact, pupils equal and round Card:  Reg Pulm:  Normal rate and effort  Abd: Soft, ND, NT +BS. Right lower lateral laparoscopic incision site with stitch in place that is c/d/I without any signs of further bleeding. Otherwise, remaining incisions with glue intact appears well and are without drainage, bleeding, or signs of infection Psych: A&Ox3  Skin: no rashes noted, warm and dry  Lab Results:  Recent Labs    01/29/21 0312 01/30/21 0418  WBC 6.2 7.6  HGB 8.7* 8.7*  HCT 25.8* 26.7*  PLT 231 287   BMET Recent Labs    01/29/21 0312 01/30/21 0205  NA 137 138  K 3.7 3.9  CL 106 106  CO2 23 22  GLUCOSE 93 77  BUN 5* 8  CREATININE 0.83 0.85  CALCIUM 8.6* 8.7*   PT/INR Recent Labs    01/27/21 1528  LABPROT 13.7  INR 1.1   CMP     Component Value Date/Time   NA 138 01/30/2021 0205   K 3.9 01/30/2021 0205   CL 106 01/30/2021 0205   CO2 22 01/30/2021 0205   GLUCOSE 77 01/30/2021 0205   BUN 8 01/30/2021 0205   CREATININE 0.85 01/30/2021 0205   CALCIUM 8.7 (L) 01/30/2021 0205   PROT 5.8 (L) 01/30/2021 0205   ALBUMIN 2.5 (L) 01/30/2021 0205   AST 196 (H) 01/30/2021 0205   ALT 285  (H) 01/30/2021 0205   ALKPHOS 166 (H) 01/30/2021 0205   BILITOT 3.3 (H) 01/30/2021 0205   GFRNONAA >60 01/30/2021 0205   GFRAA >60 11/16/2019 0950   Lipase     Component Value Date/Time   LIPASE 31 01/24/2021 0403    Studies/Results: MR 3D Recon At Scanner  Result Date: 01/28/2021 CLINICAL DATA:  Jaundice in a 25 year old female, history of reported gangrenous cholecystitis, post cholecystectomy with attempted cholangiogram at the time of surgery which was unsuccessful in the setting of gangrenous cholecystitis. EXAM: MRI ABDOMEN WITHOUT AND WITH CONTRAST (INCLUDING MRCP) TECHNIQUE: Multiplanar multisequence MR imaging of the abdomen was performed both before and after the administration of intravenous contrast. Heavily T2-weighted images of the biliary and pancreatic ducts were obtained, and three-dimensional MRCP images were rendered by post processing. CONTRAST:  77mL GADAVIST GADOBUTROL 1 MMOL/ML IV SOLN COMPARISON:  January 24, 2021 preoperative assessment. FINDINGS: Lower chest: Basilar atelectasis and small effusions, lung bases not well assessed by MRI. Hepatobiliary: Hematoma surrounding the liver inferior is largely similar to the prior study. Decreased hematoma and hemoperitoneum along the margin of the lateral and superior liver. Surgical clips in the porta hepatis compatible with cholecystectomy. Mild hepatic steatosis. No visible hepatic lesion. Hepatic arterial supply derive from celiac axis and classic fashion. Biliary duct distension increased since  previous imaging of the hepatic duct which is approximately 10 mm as compared to 8 mm on the prior study. MRCP sequences are markedly limited due to motion and susceptibility artifact. Axial high-resolution single shot images display dependent low signal and potential small rounded filling defects most suggestive of several small stones in the distal common bile duct. (Image 26/6) also on image 25/6). Pancreas: Pancreas with normal  intrinsic T1 signal. No signs of adjacent inflammation. Spleen:  Normal size and contour without visible lesion. Adrenals/Urinary Tract: Normal adrenal glands. Symmetric renal enhancement. No hydronephrosis. Stomach/Bowel: Unremarkable to the extent evaluated on abdominal MRI not performed for bowel evaluation. Vascular/Lymphatic: No pathologically enlarged lymph nodes identified. No abdominal aortic aneurysm demonstrated. Other: No significant change in the infrahepatic hematoma/hemoperitoneum. Ovoid shaped area (image 85/1300 6.7 x 4.8 cm previously 6.9 x 4.9 cm. RIGHT subhepatic hematoma 7.5 x 4.6 cm, when measured in a similar fashion 7.9 x 4.3 cm. Lesser sac/gastrohepatic hematoma is similar. Hemoperitoneum along the margin of the RIGHT hemi liver has diminished. Small hemoperitoneum in the LEFT hemiabdomen is grossly similar. Susceptibility effect in the porta hepatis related to hematoma does limit assessment. Musculoskeletal: No suspicious bone lesions identified. IMPRESSION: Choledocholithiasis is suspected though there is limited assessment on dedicated MRCP sequences. Increased biliary duct distension is present since previous imaging, it is possible the blood products/clot in the biliary tree could have a similar appearance. Biliary duct distension increased since previous imaging of the prior study. Infrahepatic hematoma with similar appearance along the inferior margin of the LEFT and RIGHT hemi liver with slightly diminished blood products along the lateral margin of the RIGHT hemi liver. Small volume hemoperitoneum in the LEFT abdomen is similar. Mild hepatic steatosis. Basilar atelectasis and small effusions, lung bases not well assessed by MRI. Correlate with any signs of pulmonary infection. Electronically Signed   By: Donzetta Kohut M.D.   On: 01/28/2021 13:38   DG ERCP BILIARY & PANCREATIC DUCTS  Result Date: 01/29/2021 CLINICAL DATA:  24 year old female with choledocholithiasis EXAM: ERCP  MRCP 01/28/2021 TECHNIQUE: Multiple spot images obtained with the fluoroscopic device and submitted for interpretation post-procedure. FLUOROSCOPY TIME:  Fluoroscopy Time:  3 minutes 14 seconds Radiation Exposure Index (if provided by the fluoroscopic device): 85.75 mGy Number of Acquired Spot Images: 0 COMPARISON:  MRCP dated 01/28/2021 FINDINGS: A total of 7 intraoperative saved images are submitted for review. The images demonstrate a flexible duodenal scope in the descending duodenum with wire cannulation of the common bile duct. Cholangiogram demonstrates filling defects in the distal common bile duct most consistent with choledocholithiasis. There is mild biliary ductal dilatation as well. Subsequent images demonstrate sphincterotomy and balloon sweeping of the common duct. Filling defects are no longer visible on the final image. IMPRESSION: 1. Choledocholithiasis. 2. ERCP with sphincterotomy and balloon sweeping of the common duct. These images were submitted for radiologic interpretation only. Please see the procedural report for the amount of contrast and the fluoroscopy time utilized. Electronically Signed   By: Malachy Moan M.D.   On: 01/29/2021 09:32   MR ABDOMEN MRCP W WO CONTAST  Result Date: 01/28/2021 CLINICAL DATA:  Jaundice in a 25 year old female, history of reported gangrenous cholecystitis, post cholecystectomy with attempted cholangiogram at the time of surgery which was unsuccessful in the setting of gangrenous cholecystitis. EXAM: MRI ABDOMEN WITHOUT AND WITH CONTRAST (INCLUDING MRCP) TECHNIQUE: Multiplanar multisequence MR imaging of the abdomen was performed both before and after the administration of intravenous contrast. Heavily T2-weighted images of the biliary  and pancreatic ducts were obtained, and three-dimensional MRCP images were rendered by post processing. CONTRAST:  1mL GADAVIST GADOBUTROL 1 MMOL/ML IV SOLN COMPARISON:  January 24, 2021 preoperative assessment.  FINDINGS: Lower chest: Basilar atelectasis and small effusions, lung bases not well assessed by MRI. Hepatobiliary: Hematoma surrounding the liver inferior is largely similar to the prior study. Decreased hematoma and hemoperitoneum along the margin of the lateral and superior liver. Surgical clips in the porta hepatis compatible with cholecystectomy. Mild hepatic steatosis. No visible hepatic lesion. Hepatic arterial supply derive from celiac axis and classic fashion. Biliary duct distension increased since previous imaging of the hepatic duct which is approximately 10 mm as compared to 8 mm on the prior study. MRCP sequences are markedly limited due to motion and susceptibility artifact. Axial high-resolution single shot images display dependent low signal and potential small rounded filling defects most suggestive of several small stones in the distal common bile duct. (Image 26/6) also on image 25/6). Pancreas: Pancreas with normal intrinsic T1 signal. No signs of adjacent inflammation. Spleen:  Normal size and contour without visible lesion. Adrenals/Urinary Tract: Normal adrenal glands. Symmetric renal enhancement. No hydronephrosis. Stomach/Bowel: Unremarkable to the extent evaluated on abdominal MRI not performed for bowel evaluation. Vascular/Lymphatic: No pathologically enlarged lymph nodes identified. No abdominal aortic aneurysm demonstrated. Other: No significant change in the infrahepatic hematoma/hemoperitoneum. Ovoid shaped area (image 85/1300 6.7 x 4.8 cm previously 6.9 x 4.9 cm. RIGHT subhepatic hematoma 7.5 x 4.6 cm, when measured in a similar fashion 7.9 x 4.3 cm. Lesser sac/gastrohepatic hematoma is similar. Hemoperitoneum along the margin of the RIGHT hemi liver has diminished. Small hemoperitoneum in the LEFT hemiabdomen is grossly similar. Susceptibility effect in the porta hepatis related to hematoma does limit assessment. Musculoskeletal: No suspicious bone lesions identified. IMPRESSION:  Choledocholithiasis is suspected though there is limited assessment on dedicated MRCP sequences. Increased biliary duct distension is present since previous imaging, it is possible the blood products/clot in the biliary tree could have a similar appearance. Biliary duct distension increased since previous imaging of the prior study. Infrahepatic hematoma with similar appearance along the inferior margin of the LEFT and RIGHT hemi liver with slightly diminished blood products along the lateral margin of the RIGHT hemi liver. Small volume hemoperitoneum in the LEFT abdomen is similar. Mild hepatic steatosis. Basilar atelectasis and small effusions, lung bases not well assessed by MRI. Correlate with any signs of pulmonary infection. Electronically Signed   By: Donzetta Kohut M.D.   On: 01/28/2021 13:38    Anti-infectives: Anti-infectives (From admission, onward)    Start     Dose/Rate Route Frequency Ordered Stop   01/29/21 0800  ampicillin-sulbactam (UNASYN) 1.5 g in sodium chloride 0.9 % 100 mL IVPB        1.5 g 200 mL/hr over 30 Minutes Intravenous  Once 01/28/21 1346 01/29/21 0904   01/26/21 1200  piperacillin-tazobactam (ZOSYN) IVPB 3.375 g  Status:  Discontinued        3.375 g 12.5 mL/hr over 240 Minutes Intravenous Every 8 hours 01/26/21 1003 01/28/21 1011   01/25/21 1000  ceFAZolin (ANCEF) IVPB 2g/100 mL premix        2 g 200 mL/hr over 30 Minutes Intravenous  Once 01/25/21 0951 01/25/21 1052   01/25/21 0951  ceFAZolin (ANCEF) 2-4 GM/100ML-% IVPB       Note to Pharmacy: Phebe Colla   : cabinet override      01/25/21 0951 01/25/21 1058        Assessment/Plan  POD 5 s/p Laparoscopic Cholecystectomy with attempted IOC, 01/26/2021 - Dr. Magnus Ivan for Gangrenous Cholecystitis  - s/p ERCP with sphincterotomy and balloon extraction by Dr. Leone Payor for Choledocholithiasis w/ partial obstruction - Abx completed. WBC normalized.  - Noted ABL anemia post op. Bleeding scan w/o active extrav. Hgb  11.1 > 8.4 > 6.7 > 2U PRBC > 9.2 > 8.0 > 1U PRBC > 9.9 > 8.6 > 8.7 > 8.7 and stable - Mobilize - Pulm toilet - Diet being advanced. Hopefully home today. Will send meds to pharmacy and arrange follow up in [facility] clinic and for suture removal.    FEN - Soft VTE - SCDs ID - Ancef peri-op. Zosyn 9/21 >> Afebrile. WBC wnl   Previous UTI per notes Hx of shunt that was placed for hydrocephalus as a child    LOS: 5 days    Jacinto Halim , [facility] 01/30/2021, 8:31 AM Please see Amion for pager number during day hours 7:00am-4:30pm

## 2021-01-30 NOTE — Progress Notes (Signed)
   Patient Name: Martha Hall Date of Encounter: 01/30/2021, 11:30 AM    Subjective  Sl RUQ pain where stitches are Asking about FMLA papers  Objective  BP 137/90 (BP Location: Right Arm)   Pulse (!) 2   Temp 98.1 F (36.7 C) (Oral)   Resp (!) 24   Ht 5\' 1"  (1.549 m)   Wt 108.4 kg   LMP 01/17/2021 (Approximate)   SpO2 93%   BMI 45.15 kg/m  Obes NAD  Recent Labs  Lab 01/26/21 01/28/21 01/26/21 1427 01/27/21 0208 01/27/21 0919 01/27/21 1528 01/28/21 0254 01/29/21 0312 01/30/21 0205  AST 192*  --  188*  --   --  152* 165* 196*  ALT 334*  --  288*  --   --  228* 240* 285*  ALKPHOS 158*  --  143*  --   --  144* 157* 166*  BILITOT 4.6*  --  6.1* 6.7*  --  7.1* 7.1* 3.3*  PROT 5.9*  --  5.3*  --   --  5.0* 5.5* 5.8*  ALBUMIN 2.9*  --  2.7*  --   --  2.5* 2.4* 2.5*  INR  --  1.1  --   --  1.1  --   --   --         Assessment and Plan  Choledocholithiasis - treated  OK to dc from my perspective Does not need GI outpatient f/u after this admission unless there are persistent problems or new problems  I told her to ask MTS team about FMLA  Signing off  02/01/21, MD, [facility] 01/30/2021 11:30 AM

## 2021-01-31 ENCOUNTER — Encounter (HOSPITAL_COMMUNITY): Payer: Self-pay | Admitting: Internal Medicine

## 2021-01-31 ENCOUNTER — Telehealth: Payer: Self-pay

## 2021-01-31 DIAGNOSIS — R109 Unspecified abdominal pain: Secondary | ICD-10-CM | POA: Diagnosis not present

## 2021-01-31 LAB — COMPREHENSIVE METABOLIC PANEL
ALT: 233 U/L — ABNORMAL HIGH (ref 0–44)
AST: 94 U/L — ABNORMAL HIGH (ref 15–41)
Albumin: 2.4 g/dL — ABNORMAL LOW (ref 3.5–5.0)
Alkaline Phosphatase: 137 U/L — ABNORMAL HIGH (ref 38–126)
Anion gap: 7 (ref 5–15)
BUN: 10 mg/dL (ref 6–20)
CO2: 24 mmol/L (ref 22–32)
Calcium: 8.6 mg/dL — ABNORMAL LOW (ref 8.9–10.3)
Chloride: 106 mmol/L (ref 98–111)
Creatinine, Ser: 0.83 mg/dL (ref 0.44–1.00)
GFR, Estimated: >60 mL/min (ref >60)
Glucose, Bld: 107 mg/dL — ABNORMAL HIGH (ref 70–99)
Potassium: 3.6 mmol/L (ref 3.5–5.1)
Sodium: 137 mmol/L (ref 135–145)
Total Bilirubin: 2.3 mg/dL — ABNORMAL HIGH (ref 0.3–1.2)
Total Protein: 5.7 g/dL — ABNORMAL LOW (ref 6.5–8.1)

## 2021-01-31 LAB — CBC
HCT: 29.1 % — ABNORMAL LOW (ref 36.0–46.0)
Hemoglobin: 9.5 g/dL — ABNORMAL LOW (ref 12.0–15.0)
MCH: 29 pg (ref 26.0–34.0)
MCHC: 32.6 g/dL (ref 30.0–36.0)
MCV: 88.7 fL (ref 80.0–100.0)
Platelets: 311 10*3/uL (ref 150–400)
RBC: 3.28 MIL/uL — ABNORMAL LOW (ref 3.87–5.11)
RDW: 17.1 % — ABNORMAL HIGH (ref 11.5–15.5)
WBC: 8.6 10*3/uL (ref 4.0–10.5)
nRBC: 0.7 % — ABNORMAL HIGH (ref 0.0–0.2)

## 2021-01-31 MED ORDER — POLYETHYLENE GLYCOL 3350 17 G PO PACK: 17.0000 g | PACK | Freq: Every day | ORAL | 0 refills | Status: DC | PRN

## 2021-01-31 MED ORDER — LACTATED RINGERS IV BOLUS
1000.0000 mL | Freq: Once | INTRAVENOUS | Status: AC
  Administered 2021-01-31: 1000 mL via INTRAVENOUS

## 2021-01-31 MED ORDER — OXYCODONE HCL 5 MG PO TABS: 5.0000 mg | ORAL_TABLET | Freq: Four times a day (QID) | ORAL | 0 refills | Status: DC | PRN

## 2021-01-31 NOTE — Progress Notes (Signed)
Progress Note  2 Days Post-Op  Subjective: Patient complaining of feeling lightheaded this AM. Some RUQ pain. She denies nausea and is drinking a lot of water.   Objective: Vital signs in last 24 hours: Temp:  [98.9 F (37.2 C)-100.6 F (38.1 C)] 98.9 F (37.2 C) (09/26 0717) Pulse Rate:  [88-124] 88 (09/26 0717) Resp:  [15-33] 24 (09/26 0717) BP: (122-154)/(65-85) 130/68 (09/26 0717) SpO2:  [91 %-96 %] 93 % (09/26 0717) Last BM Date: 01/25/21  Intake/Output from previous day: 09/25 0701 - 09/26 0700 In: 360 [P.O.:360] Out: -  Intake/Output this shift: No intake/output data recorded.  PE: General: pleasant, WD, obese female who is sitting up HEENT: sclera anicteric Heart: regular, rate, and rhythm.   Lungs: CTAB, no wheezes, rhonchi, or rales noted.  Respiratory effort nonlabored Abd: soft, appropriately ttp, ND, incisions c/d/I with suture present to R lateral incision, RUQ hematoma present     Lab Results:  Recent Labs    01/30/21 0418 01/31/21 0421  WBC 7.6 8.6  HGB 8.7* 9.5*  HCT 26.7* 29.1*  PLT 287 311   BMET Recent Labs    01/30/21 0205 01/31/21 0421  NA 138 137  K 3.9 3.6  CL 106 106  CO2 22 24  GLUCOSE 77 107*  BUN 8 10  CREATININE 0.85 0.83  CALCIUM 8.7* 8.6*   PT/INR No results for input(s): LABPROT, INR in the last 72 hours. CMP     Component Value Date/Time   NA 137 01/31/2021 0421   K 3.6 01/31/2021 0421   CL 106 01/31/2021 0421   CO2 24 01/31/2021 0421   GLUCOSE 107 (H) 01/31/2021 0421   BUN 10 01/31/2021 0421   CREATININE 0.83 01/31/2021 0421   CALCIUM 8.6 (L) 01/31/2021 0421   PROT 5.7 (L) 01/31/2021 0421   ALBUMIN 2.4 (L) 01/31/2021 0421   AST 94 (H) 01/31/2021 0421   ALT 233 (H) 01/31/2021 0421   ALKPHOS 137 (H) 01/31/2021 0421   BILITOT 2.3 (H) 01/31/2021 0421   GFRNONAA >60 01/31/2021 0421   GFRAA >60 11/16/2019 0950   Lipase     Component Value Date/Time   LIPASE 31 01/24/2021 0403        Studies/Results: No results found.  Anti-infectives: Anti-infectives (From admission, onward)    Start     Dose/Rate Route Frequency Ordered Stop   01/29/21 0800  ampicillin-sulbactam (UNASYN) 1.5 g in sodium chloride 0.9 % 100 mL IVPB        1.5 g 200 mL/hr over 30 Minutes Intravenous  Once 01/28/21 1346 01/29/21 0904   01/26/21 1200  piperacillin-tazobactam (ZOSYN) IVPB 3.375 g  Status:  Discontinued        3.375 g 12.5 mL/hr over 240 Minutes Intravenous Every 8 hours 01/26/21 1003 01/28/21 1011   01/25/21 1000  ceFAZolin (ANCEF) IVPB 2g/100 mL premix        2 g 200 mL/hr over 30 Minutes Intravenous  Once 01/25/21 0951 01/25/21 1052   01/25/21 0951  ceFAZolin (ANCEF) 2-4 GM/100ML-% IVPB       Note to Pharmacy: Phebe Colla   : cabinet override      01/25/21 0951 01/25/21 1058        Assessment/Plan POD5 s/p Laparoscopic Cholecystectomy with attempted IOC, 01/26/2021 - Dr. Magnus Ivan for Gangrenous Cholecystitis  - s/p ERCP with sphincterotomy and balloon extraction by Dr. Leone Payor for Choledocholithiasis w/ partial obstruction - Abx completed. WBC normalized.  - Noted ABL anemia post op. Bleeding scan w/o  active extrav. hgb stable at 9.5 this AM - Mobilize - Pulm toilet - Patient complaining of feeling lightheaded, could consider adding iron and vit C supplement but would not typically recommend transfusion for hgb > 7.0 without tachycardia  - consider repeating orthostatics later today and could give a small fluid bolus if needed, but would defer to primary team on this    FEN - Soft VTE - SCDs ID - Ancef peri-op. Zosyn 9/21 >9/23, unasyn x1 dose   Previous UTI per notes Hx of shunt that was placed for hydrocephalus as a child   LOS: 6 days    Juliet Rude, [facility] 01/31/2021, 9:55 AM Please see Amion for pager number during day hours 7:00am-4:30pm

## 2021-01-31 NOTE — Telephone Encounter (Signed)
Hospital TOC per Dr. Sloan Leiter, discharge 01/31/2021, appt 02/04/2021.

## 2021-02-04 ENCOUNTER — Encounter: Payer: Self-pay | Admitting: Internal Medicine

## 2021-02-04 ENCOUNTER — Other Ambulatory Visit: Payer: Self-pay

## 2021-02-04 ENCOUNTER — Ambulatory Visit (INDEPENDENT_AMBULATORY_CARE_PROVIDER_SITE_OTHER): Payer: Medicaid Other | Admitting: Internal Medicine

## 2021-02-04 VITALS — BP 114/64 | HR 98 | Temp 99.3°F | Resp 20 | Ht 61.0 in | Wt 230.7 lb

## 2021-02-04 DIAGNOSIS — D62 Acute posthemorrhagic anemia: Secondary | ICD-10-CM | POA: Diagnosis present

## 2021-02-04 DIAGNOSIS — Z09 Encounter for follow-up examination after completed treatment for conditions other than malignant neoplasm: Secondary | ICD-10-CM | POA: Diagnosis not present

## 2021-02-04 LAB — CBC WITH DIFFERENTIAL/PLATELET
Abs Immature Granulocytes: 0.06 10*3/uL (ref 0.00–0.07)
Basophils Absolute: 0 10*3/uL (ref 0.0–0.1)
Basophils Relative: 0 %
Eosinophils Absolute: 0 10*3/uL (ref 0.0–0.5)
Eosinophils Relative: 1 %
HCT: 29.9 % — ABNORMAL LOW (ref 36.0–46.0)
Hemoglobin: 9.5 g/dL — ABNORMAL LOW (ref 12.0–15.0)
Immature Granulocytes: 1 %
Lymphocytes Relative: 17 %
Lymphs Abs: 1.4 10*3/uL (ref 0.7–4.0)
MCH: 28.6 pg (ref 26.0–34.0)
MCHC: 31.8 g/dL (ref 30.0–36.0)
MCV: 90.1 fL (ref 80.0–100.0)
Monocytes Absolute: 0.9 10*3/uL (ref 0.1–1.0)
Monocytes Relative: 11 %
Neutro Abs: 5.9 10*3/uL (ref 1.7–7.7)
Neutrophils Relative %: 70 %
Platelets: 406 10*3/uL — ABNORMAL HIGH (ref 150–400)
RBC: 3.32 MIL/uL — ABNORMAL LOW (ref 3.87–5.11)
RDW: 16 % — ABNORMAL HIGH (ref 11.5–15.5)
WBC: 8.4 10*3/uL (ref 4.0–10.5)
nRBC: 0 % (ref 0.0–0.2)

## 2021-02-04 LAB — COMPREHENSIVE METABOLIC PANEL
ALT: 62 U/L — ABNORMAL HIGH (ref 0–44)
AST: 26 U/L (ref 15–41)
Albumin: 2.8 g/dL — ABNORMAL LOW (ref 3.5–5.0)
Alkaline Phosphatase: 109 U/L (ref 38–126)
Anion gap: 6 (ref 5–15)
BUN: 8 mg/dL (ref 6–20)
CO2: 23 mmol/L (ref 22–32)
Calcium: 8.7 mg/dL — ABNORMAL LOW (ref 8.9–10.3)
Chloride: 108 mmol/L (ref 98–111)
Creatinine, Ser: 0.68 mg/dL (ref 0.44–1.00)
GFR, Estimated: >60 mL/min (ref >60)
Glucose, Bld: 101 mg/dL — ABNORMAL HIGH (ref 70–99)
Potassium: 3.8 mmol/L (ref 3.5–5.1)
Sodium: 137 mmol/L (ref 135–145)
Total Bilirubin: 1.9 mg/dL — ABNORMAL HIGH (ref 0.3–1.2)
Total Protein: 6.5 g/dL (ref 6.5–8.1)

## 2021-02-04 NOTE — Progress Notes (Signed)
HPI:  [name] is a 25 y.o. female with a history of alpha thalassemia trait and hydrocephalus s/p shunt placement at birth who presents for hospital follow-up and suture removal.   She presented to the ED on 9/19 for 3 weeks of intermittent abdominal pain. In the ED labs were notable for elevated AST, ALT, and alk phos with total bilirubin elevated to 3.8. She was hospitalized and underwent laprascopic cholecystectomy on 9/20. She then developed acute blood loss anemia with her Hgb dropping from 12.3 to 6.7 and was subsequently transfused 3 units of red blood cells. Her source of bleeding was determined to be coming from one of her ports and an additional suture was placed. She also underwent ERCP on 9/24 and a small stone was removed from her cystic duct.   Today she states she is doing okay, still experiencing some abdominal pain but the oxycodone is helping. She notes that she has been having nightly sweats requiring her to change her clothes and sheets and a low-grade fever that have occurred since her lap chole on 9/20. She is passing gas and having normal bowel movements. She denies dysuria, nausea, vomiting, severe abdominal pain, or blood in her stool.   Please see Encounters tab for problem-based assessment and plan.   Physical Exam:  Vitals:   02/04/21 0939  BP: 114/64  Pulse: 98  Resp: 20  Temp: 99.3 F (37.4 C)  TempSrc: Oral  SpO2: 98%  Weight: 230 lb 11.2 oz (104.6 kg)  Height: '5\' 1"'  (1.549 m)   Gen: in NAD CV: RRR, no murmurs heard Abd: Four healing surgical incisions present. No erythema or purulent drainage seen. Mild tenderness at surgical sites. Normal bowel sounds present.   Assessment & Plan:   See Encounters Tab for problem based charting.  Patient discussed with Dr. Evette Doffing  Problem List Items Addressed This [facility] discharge follow-up    Patient presented to the ED on 9/19 for 3 weeks of intermittent abdominal pain.  In the ED labs were notable for elevated AST, ALT, and alk phos with total bilirubin elevated to 3.8. She was hospitalized and underwent laprascopic cholecystectomy on 9/20. She then developed acute blood loss anemia with her Hgb dropping from 12.3 to 6.7 and was subsequently transfused 3 units of red blood cells. Her source of bleeding was determined to be coming from one of her ports and an additional suture was placed. She also underwent ERCP on 9/24 and a small stone was removed from her cystic duct.   Today she is still experiencing some abdominal pain but the oxycodone is helping. She has been having nightly sweats requiring her to change her clothes and sheets and a low-grade fever that have occurred since her lap chole on 9/20. She is passing gas and having normal bowel movements. She denies dysuria, nausea, vomiting, severe abdominal pain, or blood in her stool.  Abdominal exam revealed four healing surgical incisions present with no erythema, edema, or purulent drainage. We removed the remaining suture from her port site. Mild tenderness at surgical sites. Normal bowel sounds present.   Some concern for infection given persistently night sweats and self-report of fever, although she has not been able to locate or use a thermometer at all. Will order stat CBC, CMP, and blood culture today. She has follow-up with surgery scheduled on 10/18  Plan:  -Stat CBC, CMP, and blood cultures ordered -Follow-up with surgery on 10/18  Addendum:  -CBC with WBC 8.4, Hgb 9.5 (stable from discharge)         Other Visit Diagnoses     Acute blood loss anemia    -  Primary   Relevant Orders   CMP14 + Anion Gap   CBC with Diff (Completed)   CMP w Anion Gap (STAT/Sunquest-performed on-site) (Completed)   Culture, Blood   Culture, Blood       No follow-ups on file.

## 2021-02-04 NOTE — Patient Instructions (Addendum)
Martha Hall,   It was a pleasure seeing you today. Today we discussed your recent hospitalization. We have removed your suture today, and will be collecting some labs to help [place] rule out infection. We will call you with the results, and next steps. Please follow up with your surgery follow up on February 22, 2022  Dolan Amen, MD

## 2021-02-04 NOTE — Progress Notes (Signed)
Internal Medicine Clinic Attending ? ?Case discussed with Dr. Winters  At the time of the visit.  We reviewed the resident?s history and exam and pertinent patient test results.  I agree with the assessment, diagnosis, and plan of care documented in the resident?s note.  ?

## 2021-02-04 NOTE — Assessment & Plan Note (Addendum)
Patient presented to the ED on 9/19 for 3 weeks of intermittent abdominal pain. In the ED labs were notable for elevated AST, ALT, and alk phos with total bilirubin elevated to 3.8. She was hospitalized and underwent laprascopic cholecystectomy on 9/20. She then developed acute blood loss anemia with her Hgb dropping from 12.3 to 6.7 and was subsequently transfused 3 units of red blood cells. Her source of bleeding was determined to be coming from one of her ports and an additional suture was placed. She also underwent ERCP on 9/24 and a small stone was removed from her cystic duct.   Today she is still experiencing some abdominal pain but the oxycodone is helping. She has been having nightly sweats requiring her to change her clothes and sheets and a low-grade fever that have occurred since her lap chole on 9/20. She is passing gas and having normal bowel movements. She denies dysuria, nausea, vomiting, severe abdominal pain, or blood in her stool.  Abdominal exam revealed four healing surgical incisions present with no erythema, edema, or purulent drainage. We removed the remaining suture from her port site. Mild tenderness at surgical sites. Normal bowel sounds present.   Some concern for infection given persistently night sweats and self-report of fever, although she has not been able to locate or use a thermometer at all. Will order stat CBC, CMP, and blood culture today. She has follow-up with surgery scheduled on 10/18  Plan:  -Stat CBC, CMP, and blood cultures ordered -Follow-up with surgery on 10/18  Addendum:  -CBC with WBC 8.4, Hgb 9.5 (stable from discharge)    

## 2021-02-08 ENCOUNTER — Telehealth: Payer: Self-pay | Admitting: Internal Medicine

## 2021-02-08 NOTE — Telephone Encounter (Signed)
Pt requesting a call back about having a letter written.  Pt rec'd a letter from Dr. Sloan Leiter excusing her from work until 02/21/2021 which is in Epic.  Lorain Childes) Pt also states Dr. Evlyn Kanner  filled out her paperwork for her job as well.    Pt states she really needs help as she is on complete bedrest until she sees [facility] on 02/22/2021.

## 2021-02-08 NOTE — Telephone Encounter (Signed)
RTC to patient, she states she "no longer needs the letter she was requesting earlier".  RN informed patient call will be disregarded.  No further needs verbalized. Encounter closed. SChaplin, RN,BSN

## 2021-02-09 LAB — CULTURE, BLOOD (SINGLE)
Culture: NO GROWTH
Culture: NO GROWTH
Special Requests: ADEQUATE

## 2021-02-18 DIAGNOSIS — F4323 Adjustment disorder with mixed anxiety and depressed mood: Secondary | ICD-10-CM | POA: Diagnosis not present

## 2021-02-22 ENCOUNTER — Other Ambulatory Visit (HOSPITAL_COMMUNITY): Payer: Self-pay | Admitting: Student

## 2021-02-22 ENCOUNTER — Other Ambulatory Visit: Payer: Self-pay | Admitting: Student

## 2021-02-22 DIAGNOSIS — R1084 Generalized abdominal pain: Secondary | ICD-10-CM | POA: Diagnosis not present

## 2021-02-25 ENCOUNTER — Ambulatory Visit (HOSPITAL_COMMUNITY): Payer: Medicaid Other

## 2021-03-01 NOTE — Addendum Note (Signed)
Addended by: Bufford Spikes on: 03/01/2021 03:28 PM   Modules accepted: Orders

## 2021-03-03 ENCOUNTER — Ambulatory Visit (HOSPITAL_COMMUNITY): Payer: Medicaid Other

## 2021-03-03 ENCOUNTER — Encounter (HOSPITAL_COMMUNITY): Payer: Self-pay

## 2021-03-04 ENCOUNTER — Other Ambulatory Visit (HOSPITAL_COMMUNITY): Payer: Self-pay | Admitting: Student

## 2021-03-04 ENCOUNTER — Ambulatory Visit (HOSPITAL_COMMUNITY)
Admission: RE | Admit: 2021-03-04 | Discharge: 2021-03-04 | Disposition: A | Discharge status: Home / Self Care | Payer: Medicaid Other | Source: Ambulatory Visit | Attending: Student | Admitting: Student

## 2021-03-04 ENCOUNTER — Other Ambulatory Visit: Payer: Self-pay | Admitting: Student

## 2021-03-04 DIAGNOSIS — R1084 Generalized abdominal pain: Secondary | ICD-10-CM

## 2021-03-04 DIAGNOSIS — R109 Unspecified abdominal pain: Secondary | ICD-10-CM | POA: Diagnosis not present

## 2021-03-04 DIAGNOSIS — R5082 Postprocedural fever: Secondary | ICD-10-CM

## 2021-03-04 IMAGING — CT CT ABD-PELV W/ CM
2 of 5 series · 16 of 46 positions shown, 18 images · IV contrast (APPLIED)
Comparison: CT [DATE], ERCP [DATE]

CLINICAL DATA: Abdominal pain, fevers

EXAM:
CT ABDOMEN AND PELVIS WITH CONTRAST
TECHNIQUE: Multidetector CT imaging of the abdomen and pelvis was performed
using the standard protocol following bolus administration of
intravenous contrast.
CONTRAST:  80mL OMNIPAQUE IOHEXOL 350 MG/ML SOLN

[Series 2: axial st · axial · 0.88mm/px · z∈[+1107,+1542]mm · 13 of 101 slices shown, 15 images]
[im 7/101  soft-tissue]
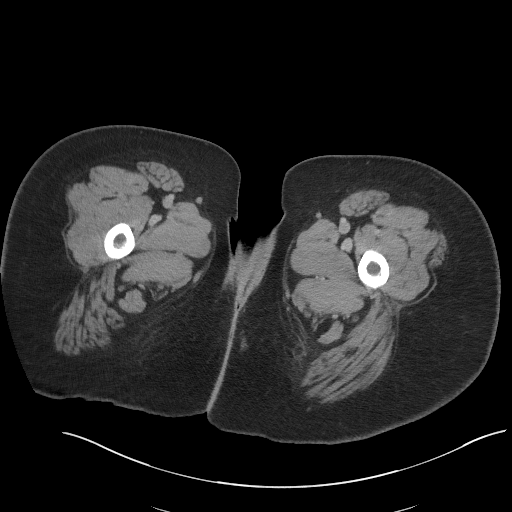
[im 7/101  bone]
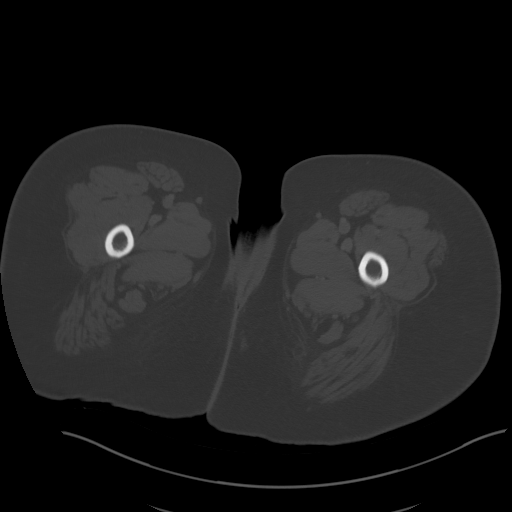
[im 13/101  soft-tissue]
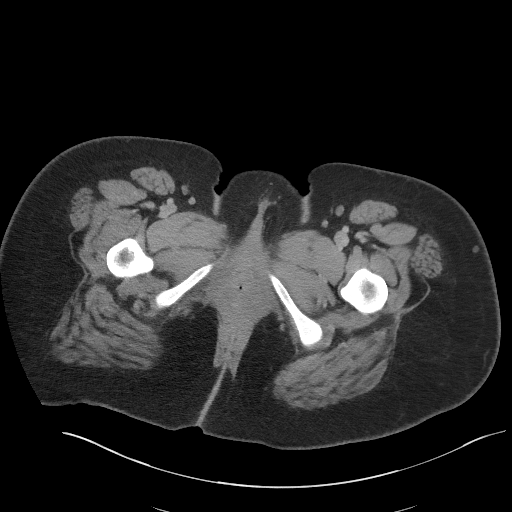
[im 19/101  soft-tissue]
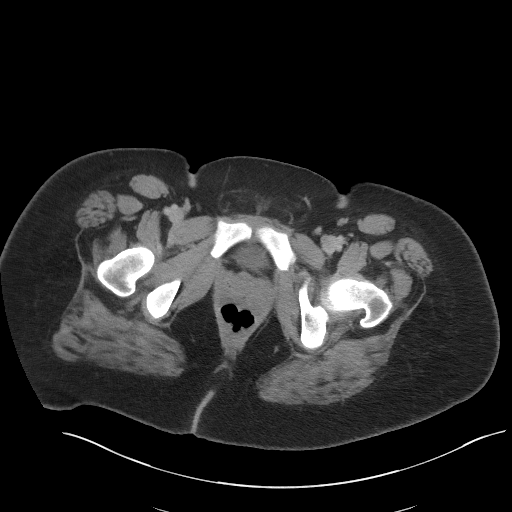
[im 32/101  soft-tissue]
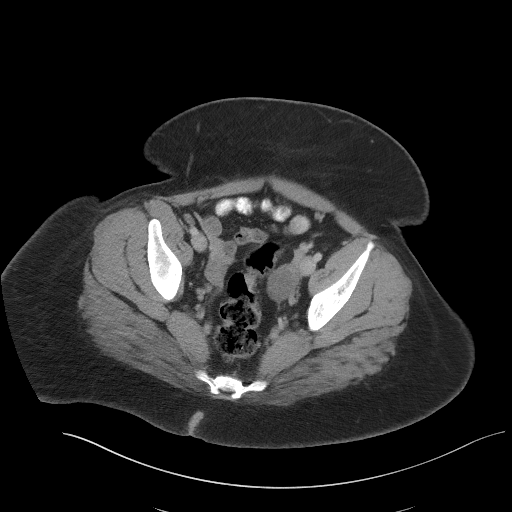
[im 38/101  soft-tissue]
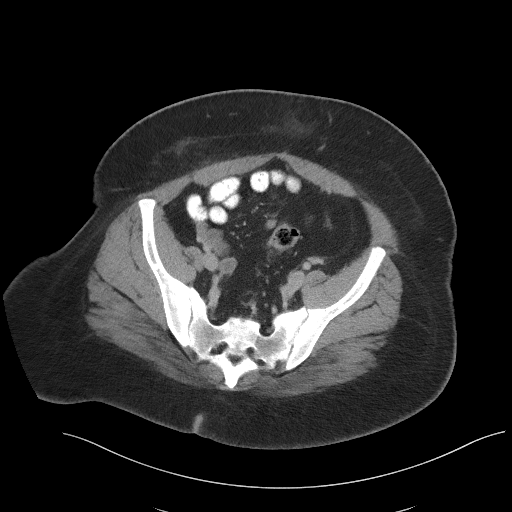
[im 44/101  soft-tissue]
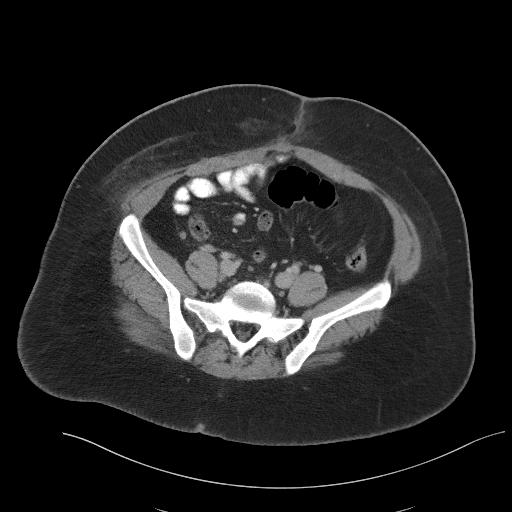
[im 51/101  soft-tissue]
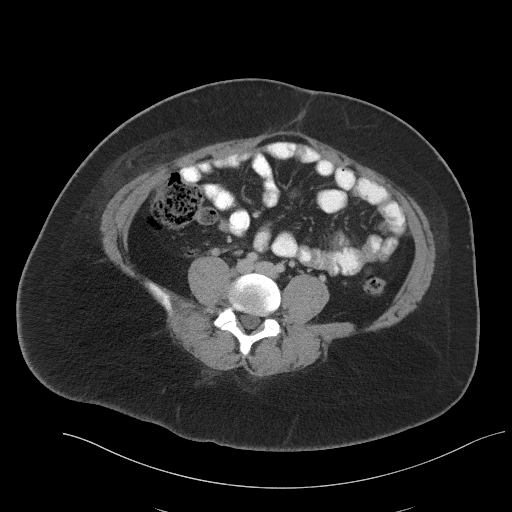
[im 57/101  soft-tissue]
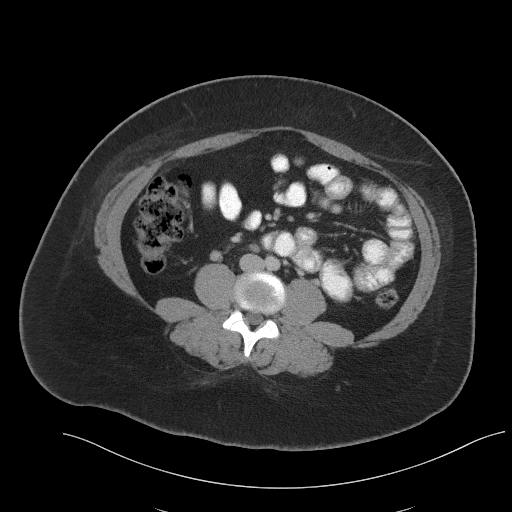
[im 63/101  soft-tissue]
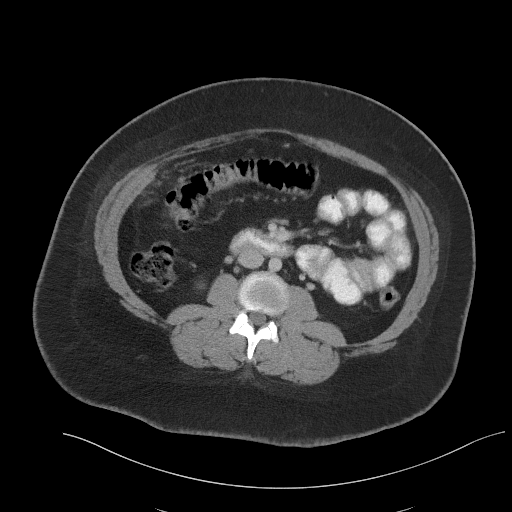
[im 63/101  bone]
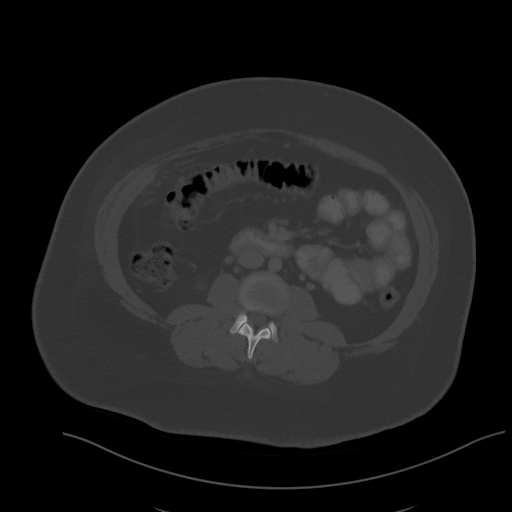
[im 69/101  soft-tissue]
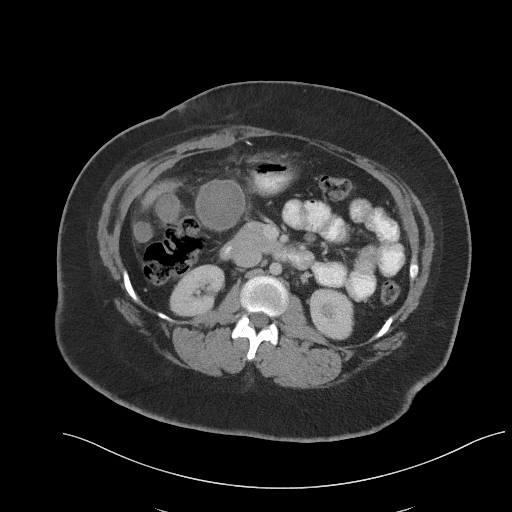
[im 82/101  soft-tissue]
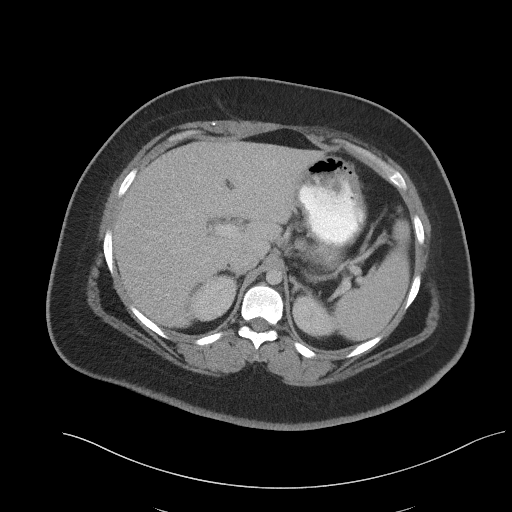
[im 88/101  soft-tissue]
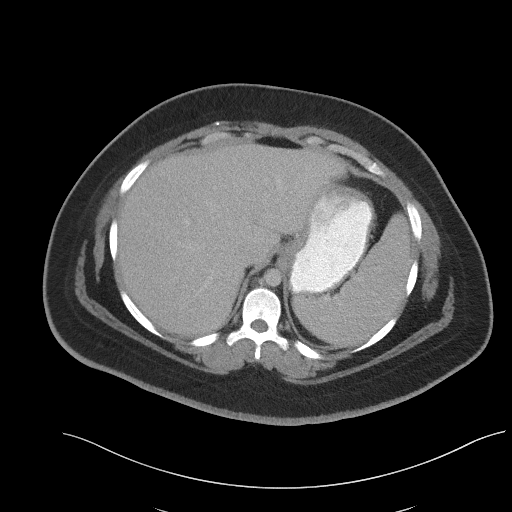
[im 94/101  soft-tissue]
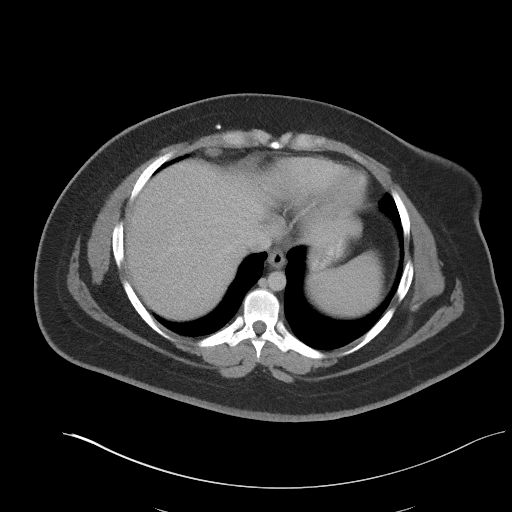

[Series 5: coronal st · coronal · 0.99mm/px · 3 of 112 slices shown]
[im 38/112  soft-tissue]
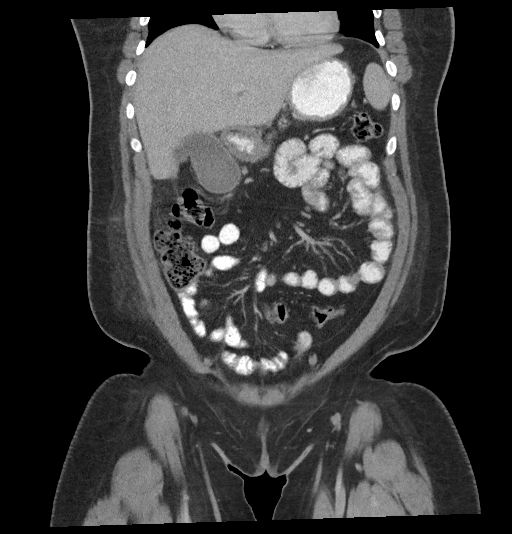
[im 50/112  soft-tissue]
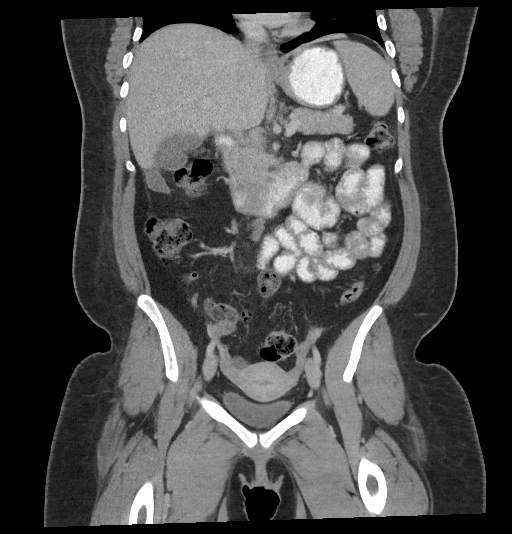
[im 62/112  soft-tissue]
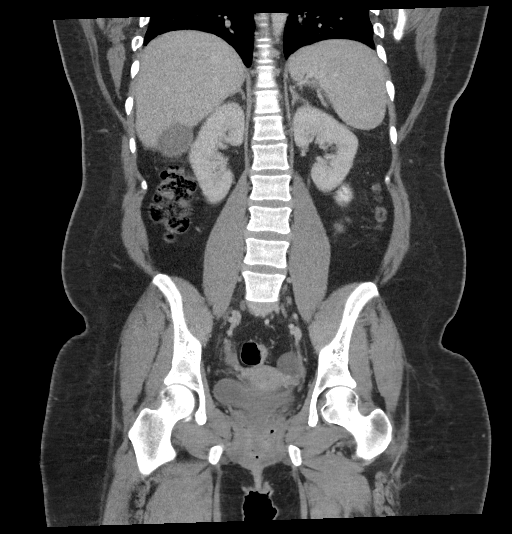

[16 of 46 positions shown; findings below may reference images not displayed]

FINDINGS: Lower chest: No acute abnormality.

Hepatobiliary: No focal liver abnormality is seen. Prior
cholecystectomy. There are persistent heterogeneous fluid
collections in the right upper quadrant along the gallbladder fossa
and inferior right liver edge, unclear if all communicating or two
separate collections, measuring up to 4.8 and 4.5 cm short axis
(series 2, images 25-32). There are persistent mild adjacent
inflammatory changes in the right upper quadrant.

Pancreas: Unremarkable. No pancreatic ductal dilatation or
surrounding inflammatory changes.

Spleen: Normal in size without focal abnormality.

Adrenals/Urinary Tract: Adrenal glands are unremarkable. No
hydronephrosis or nephrolithiasis. Bladder is unremarkable.

Stomach/Bowel: The stomach is within normal limits. There is no
evidence of bowel obstruction. The appendix is normal.

Vascular/Lymphatic: No significant vascular findings are present. No
enlarged abdominal or pelvic lymph nodes.

Reproductive: Dominant left ovarian follicle. Unremarkable
appearance for age.

Other: Prior midline incision. No hernia. Resolved hemoperitoneum
elsewhere in the abdomen and pelvis since the prior exam. No other
free fluid other than within the collections described above. No
free intraperitoneal gas.

Musculoskeletal: No acute or significant osseous findings.
IMPRESSION: Persistent hematoma in the right upper quadrant along the
gallbladder fossa and inferior right liver edge. This appears more
well-defined in comparison to prior exam and now with a thick
enhancing rim, and with persistent mild adjacent inflammatory
changes. Infected hematoma is possible. Resolved hemoperitoneum
elsewhere in the abdomen and pelvis since the prior exam.

These results will be called to the ordering clinician or
representative by the Radiologist Assistant, and communication
documented in the PACS or [REDACTED].

## 2021-03-04 MED ORDER — IOHEXOL 350 MG/ML SOLN
80.0000 mL | Freq: Once | INTRAVENOUS | Status: AC | PRN
  Administered 2021-03-04: 80 mL via INTRAVENOUS

## 2021-03-04 NOTE — Progress Notes (Signed)
Virtual Visit via Telephone Note  I connected with Martha Hall, on 03/07/2021 at 8:49 AM by telephone due to the COVID-19 pandemic and verified that I am speaking with the correct person using two identifiers.  Due to current restrictions/limitations of in-office visits due to the COVID-19 pandemic, this scheduled clinical appointment was converted to a telehealth visit.   Consent: I discussed the limitations, risks, security and privacy concerns of performing an evaluation and management service by telephone and the availability of in person appointments. I also discussed with the patient that there may be a patient responsible charge related to this service. The patient expressed understanding and agreed to proceed.   Location of Patient: Home  Location of Provider: [facility] [place] participating in Telemedicine visit: Sherrell Lydon Durene Fruits, NP Elmon Else, CMA  History of Present Illness: Martha Hall is a 25 year-old female who presents to establish care.   Current issues and/or concerns: CHOLECYSTECTOMY FOLLOW-UP: 02/22/2021 at [facility] per MD note: Assessment and Plan:  Diagnoses and all orders for this visit: Status post laparoscopic cholecystectomy Generalized abdominal pain - CT abdomen pelvis with contrast - CBC with Diff, Platelet, NLR - [facility]; Future - CMP14+eGFR - [facility]; Future Patient is doing well overall, but continues to have night sweats and is drenching her clothing. No other concerning factors. Although her CBC and CMP were improved from 2 weeks ago, I spoke to Dr. Ninfa Linden who recommended repeat blood work and CT scan to rule out infected hematoma. She understands the plan and we will call her with the results. Addendum: All lab results 02/22/21 were normal. WBC 6.6, hemoglobin 12.4, creatinine 0.70, total bilirubin 0.7, alk phos 109, AST 21, ALT 18.  03/07/2021: Cholecystectomy  01/25/2021. Concern for right upper abdominal hematoma at surgical site, denies red flag symptoms such as but not limited to stomach pain, stomach tenderness, compromised skin integrity, and vomiting. Reports initially at hospital discharge told that hematoma would absorb itself back but has not done so as of present. Still having fever, sweating, and hot all of the time. Temperature yesterday 100.7 and took Tylenol which helps some but then fever returns. Has not taken temperature today. Amoxicillin makes feel nauseated. Reports was not called from [facility] about lab results as she told she would be. Reports she looked on MyChart and saw that all labs were normal. Reports she is planning to hear from [facility] on today with plan regarding hematoma and CT scan completed on Friday.    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 Per consultation with Dr. Rosana Hoes, no further work-up necessary. Per MFM: " The patient was reassured that the anti-Lewis antibodies are usually of the IgM subtype and therefore we will not cross the placenta and cause fetal anemia.  The Lewis antibodies should therefore not affect the management of her current pregnancy."   Vaginal trichomoniasis 05/09/2019   04/30/2019 on Pap '[]' tx '[]' TOC- neg   Allergies  Allergen Reactions   Metoclopramide Hives and Itching    Current Outpatient Medications on File Prior to Visit  Medication Sig Dispense Refill   acetaminophen (TYLENOL) 500 MG tablet Take 1,000 mg by mouth every 6 (six)  hours as needed for moderate pain or headache.     amoxicillin-clavulanate (AUGMENTIN) 875-125 MG tablet SMARTSIG:1 Tablet(s) By Mouth Every 12 Hours     famotidine (PEPCID) 20 MG tablet  Take 1 tablet (20 mg total) by mouth 2 (two) times daily. 30 tablet 0   tiZANidine (ZANAFLEX) 4 MG tablet Take 1 tablet (4 mg total) by mouth every 8 (eight) hours as needed for muscle spasms. 30 tablet 0   [DISCONTINUED] diphenhydrAMINE (BENADRYL) 25 MG tablet Take 12.5 mg by mouth every 6 (six) hours as needed for allergies. (Patient not taking: Reported on 12/30/2019)     No current facility-administered medications on file prior to visit.    Observations/Objective: Alert and oriented x 3. Not in acute distress. Physical examination not completed as this is a telemedicine visit.  Assessment and Plan: 1. Encounter to establish care: - Patient presents today to establish care.  - Return for annual physical examination, labs, and health maintenance. Arrive fasting meaning having no food for at least 8 hours prior to appointment. You may have only water or black coffee. Please take scheduled medications as normal.  2. Acute cholecystitis: 3. Choledocholithiasis: - Keep all scheduled appointments with [facility].    Follow Up Instructions: Return for annual physical exam.   Patient was given clear instructions to go to Emergency Department or return to medical center if symptoms don't improve, worsen, or new problems develop.The patient verbalized understanding.  I discussed the assessment and treatment plan with the patient. The patient was provided an opportunity to ask questions and all were answered. The patient agreed with the plan and demonstrated an understanding of the instructions.   The patient was advised to call back or seek an in-person evaluation if the symptoms worsen or if the condition fails to improve as anticipated.    I provided 10 minutes total of non-face-to-face time during this encounter.   Camillia Herter, NP  [facility] [place], [facility] 03/07/2021, 8:49 AM

## 2021-03-07 ENCOUNTER — Other Ambulatory Visit: Payer: Self-pay

## 2021-03-07 ENCOUNTER — Encounter: Payer: Self-pay | Admitting: Family

## 2021-03-07 ENCOUNTER — Ambulatory Visit (INDEPENDENT_AMBULATORY_CARE_PROVIDER_SITE_OTHER): Payer: Medicaid Other | Admitting: Family

## 2021-03-07 ENCOUNTER — Other Ambulatory Visit (HOSPITAL_COMMUNITY): Payer: Self-pay | Admitting: Student

## 2021-03-07 DIAGNOSIS — R5082 Postprocedural fever: Secondary | ICD-10-CM

## 2021-03-07 DIAGNOSIS — K8050 Calculus of bile duct without cholangitis or cholecystitis without obstruction: Secondary | ICD-10-CM | POA: Diagnosis not present

## 2021-03-07 DIAGNOSIS — K810 Acute cholecystitis: Secondary | ICD-10-CM

## 2021-03-07 DIAGNOSIS — R1084 Generalized abdominal pain: Secondary | ICD-10-CM

## 2021-03-07 DIAGNOSIS — Z7689 Persons encountering health services in other specified circumstances: Secondary | ICD-10-CM

## 2021-03-07 NOTE — Progress Notes (Signed)
Pt presents for telemedicine visit to establish care, recently had  cholecystectomy and was advised need to establish w/PCP

## 2021-03-09 ENCOUNTER — Encounter (HOSPITAL_COMMUNITY): Payer: Self-pay

## 2021-03-09 NOTE — Progress Notes (Signed)
Robinette Haines, Jere Legal Sex  Female DOB  09-23-1995 SSN  [id number] Address  [address] [place] [address] Phone  [phone] [facility])  [phone] (Mobile)    RE: CT ASPIRATION Received: 2 days ago Suttle, Thressa Sheller, MD  Okey Dupre Approved for CT guided biloma/hematoma aspiration and possible drainage.   Dylan        Previous Messages   ----- Message -----  From: Okey Dupre  Sent: 03/04/2021   6:41 PM EDT  To: Ir Procedure Requests  Subject: CT ASPIRATION                                   CT ASPIRATION     Reason: Postop Fever, Generalized abdominal pain     History: CT abdomen pelvis w contrast  done today  and previous MRI and CT in computer     Name: Hedda Slade     Contact: [phone]

## 2021-03-16 ENCOUNTER — Other Ambulatory Visit: Payer: Self-pay | Admitting: Radiology

## 2021-03-17 ENCOUNTER — Other Ambulatory Visit (HOSPITAL_COMMUNITY): Payer: Self-pay | Admitting: Student

## 2021-03-17 ENCOUNTER — Other Ambulatory Visit: Payer: Self-pay | Admitting: Surgery

## 2021-03-17 ENCOUNTER — Other Ambulatory Visit: Payer: Self-pay | Admitting: Student

## 2021-03-17 ENCOUNTER — Ambulatory Visit (HOSPITAL_COMMUNITY)
Admission: RE | Admit: 2021-03-17 | Discharge: 2021-03-17 | Disposition: A | Discharge status: Home / Self Care | Payer: Medicaid Other | Source: Ambulatory Visit | Attending: Student | Admitting: Student

## 2021-03-17 ENCOUNTER — Other Ambulatory Visit (HOSPITAL_COMMUNITY): Payer: Self-pay

## 2021-03-17 ENCOUNTER — Other Ambulatory Visit: Payer: Self-pay

## 2021-03-17 ENCOUNTER — Other Ambulatory Visit: Payer: Self-pay | Admitting: Physician Assistant

## 2021-03-17 DIAGNOSIS — R5082 Postprocedural fever: Secondary | ICD-10-CM

## 2021-03-17 DIAGNOSIS — R1084 Generalized abdominal pain: Secondary | ICD-10-CM | POA: Insufficient documentation

## 2021-03-17 DIAGNOSIS — Z9049 Acquired absence of other specified parts of digestive tract: Secondary | ICD-10-CM

## 2021-03-17 DIAGNOSIS — R188 Other ascites: Secondary | ICD-10-CM

## 2021-03-17 DIAGNOSIS — L0291 Cutaneous abscess, unspecified: Secondary | ICD-10-CM | POA: Insufficient documentation

## 2021-03-17 DIAGNOSIS — T8143XA Infection following a procedure, organ and space surgical site, initial encounter: Secondary | ICD-10-CM | POA: Diagnosis not present

## 2021-03-17 LAB — CBC
HCT: 40.4 % (ref 36.0–46.0)
Hemoglobin: 12.4 g/dL (ref 12.0–15.0)
MCH: 26.6 pg (ref 26.0–34.0)
MCHC: 30.7 g/dL (ref 30.0–36.0)
MCV: 86.5 fL (ref 80.0–100.0)
Platelets: 244 10*3/uL (ref 150–400)
RBC: 4.67 MIL/uL (ref 3.87–5.11)
RDW: 14.7 % (ref 11.5–15.5)
WBC: 5.5 10*3/uL (ref 4.0–10.5)
nRBC: 0 % (ref 0.0–0.2)

## 2021-03-17 LAB — PREGNANCY, URINE: Preg Test, Ur: NEGATIVE

## 2021-03-17 LAB — PROTIME-INR
INR: 1 (ref 0.8–1.2)
Prothrombin Time: 13.5 seconds (ref 11.4–15.2)

## 2021-03-17 IMAGING — CT CT IMAGE GUIDED DRAINAGE BY PERCUTANEOUS CATHETER
1 of 5 series · 8 of 32 positions shown, 13 images · non-contrast
Comparison: CT ABDOMEN PELVIS-[DATE];

INDICATION: History of laparoscopic cholecystectomy on [DATE] with
persistent abdominal pain, fever and chills, now with indeterminate
fluid collections within the gallbladder fossa and about the
inferior medial aspect the right lobe of the liver worrisome for
either hematomas versus contained biloma.

Patient presents today for image guided aspiration and/or drainage
catheter placement.
EXAM:
1. ULTRASOUND AND CT-GUIDED PERCUTANEOUS DRAINAGE CATHETER PLACEMENT
X2
2. ULTRASOUND AND CT-GUIDED ASPIRATION

[Series 506: i-spiral 5.0 b40f · axial · 0.88mm/px · z∈[+974,+1181]mm · 8 of 77 slices shown, 13 images]
[im 9/77  soft-tissue]
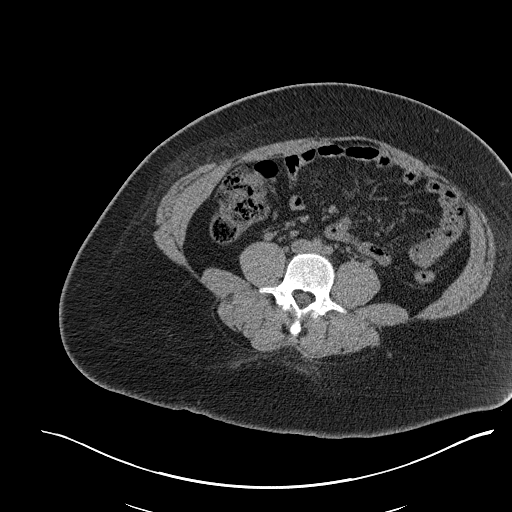
[im 9/77  bone]
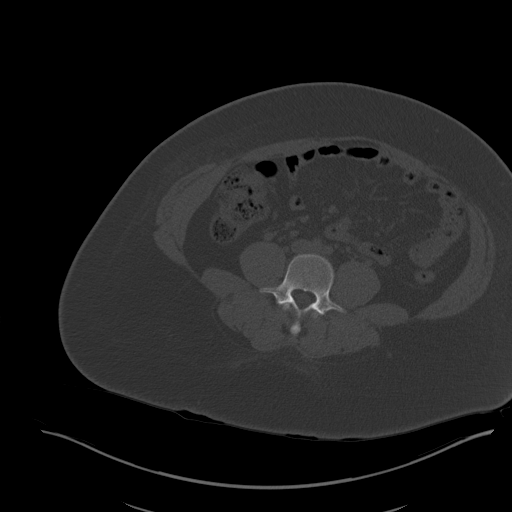
[im 17/77  soft-tissue]
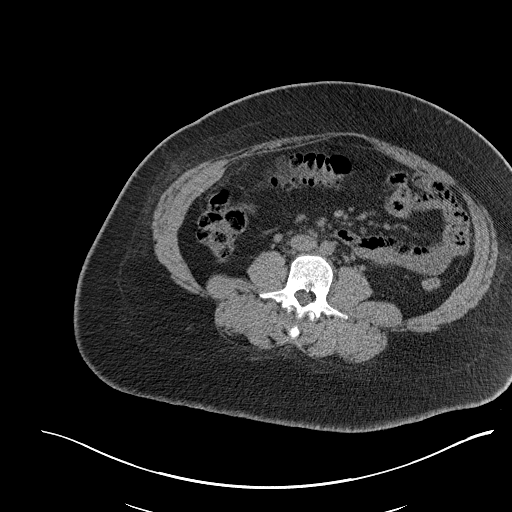
[im 26/77  soft-tissue]
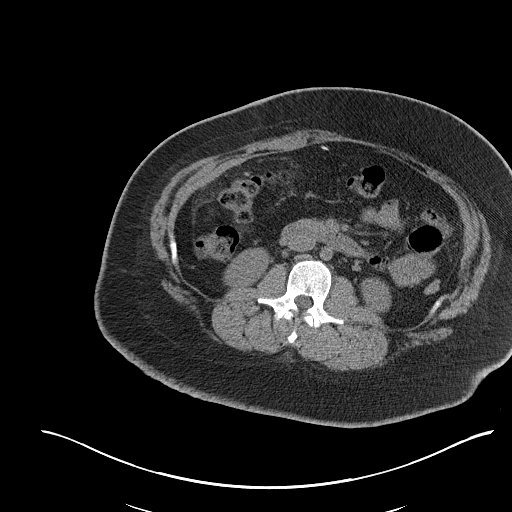
[im 34/77  soft-tissue]
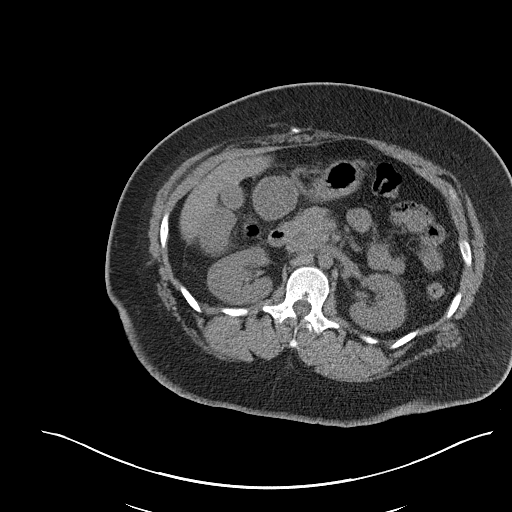
[im 43/77  soft-tissue]
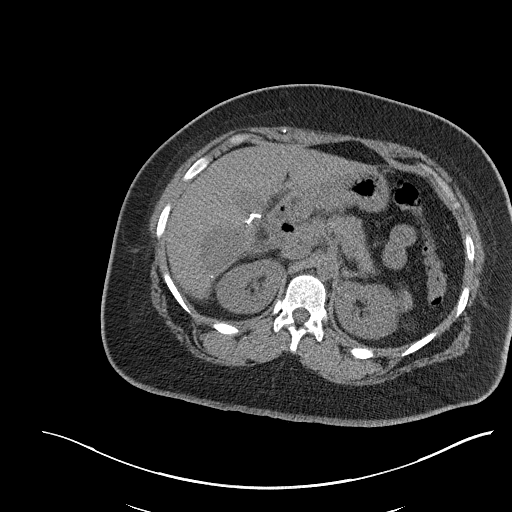
[im 43/77  lung]
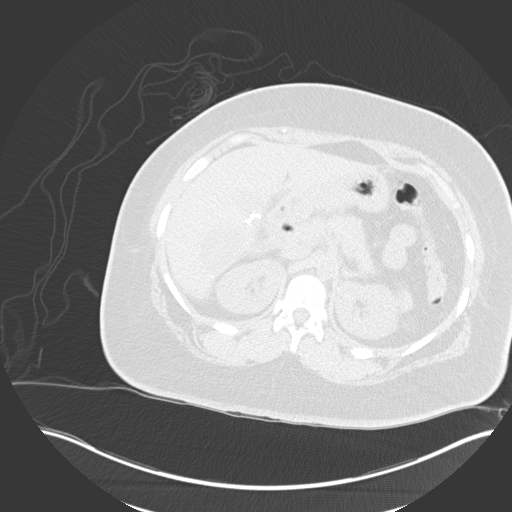
[im 51/77  soft-tissue]
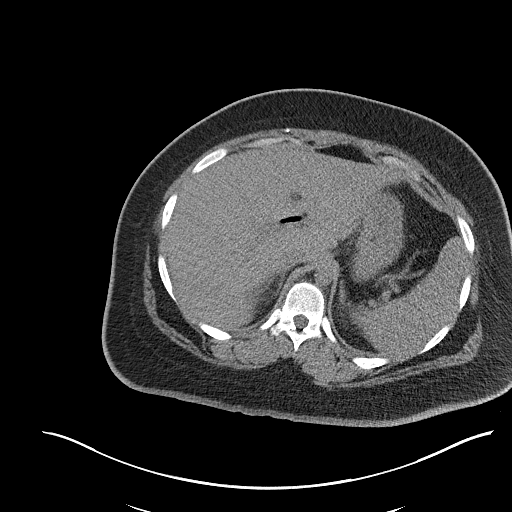
[im 51/77  lung]
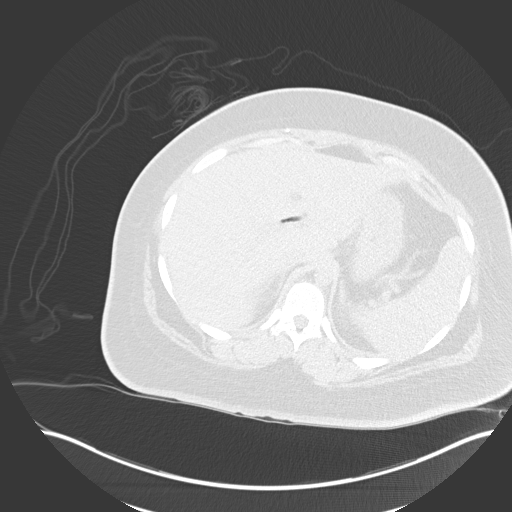
[im 60/77  soft-tissue]
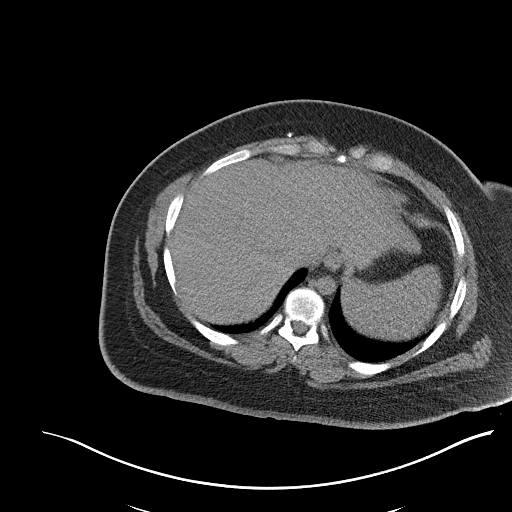
[im 60/77  lung]
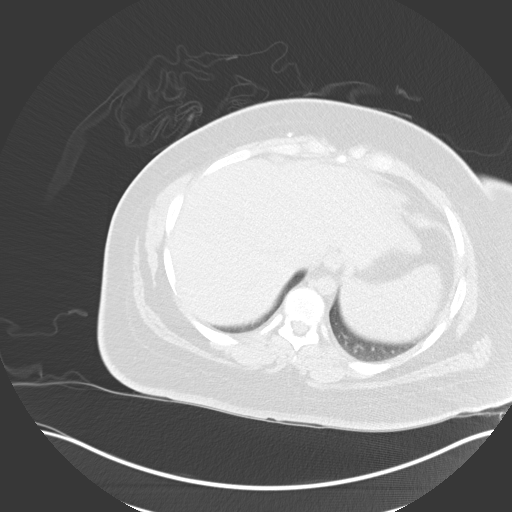
[im 68/77  soft-tissue]
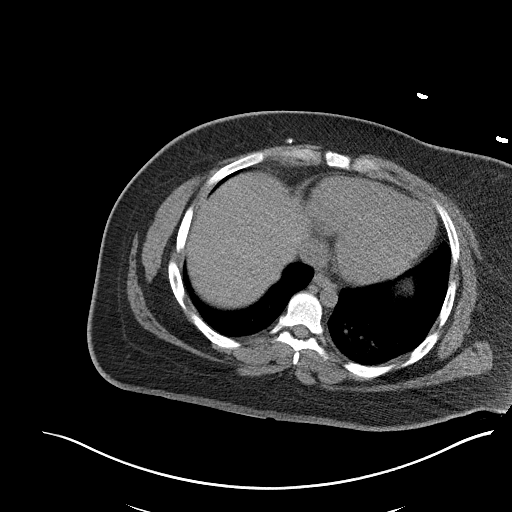
[im 68/77  lung]
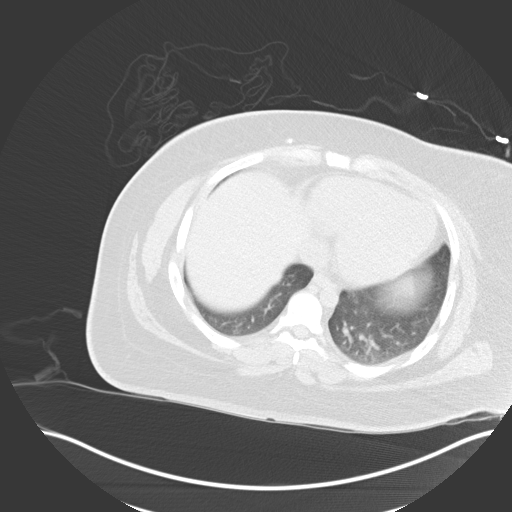

[8 of 32 positions shown; findings below may reference images not displayed]

ERCP-[DATE]

MEDICATIONS:
NONE

ANESTHESIA/SEDATION:
Moderate (conscious) sedation was employed during this procedure as
administered by the [REDACTED].

A total of Versed 4 mg and Fentanyl 200 mcg was administered
intravenously.

Moderate Sedation Time: 43 minutes. The patient's level of
consciousness and vital signs were monitored continuously by
radiology nursing throughout the procedure under my direct
supervision.

CONTRAST:  None

COMPLICATIONS:
None immediate.

PROCEDURE:
Informed written consent was obtained from the patient after a
discussion of the risks, benefits and alternatives to treatment. The
patient was placed supine on the CT gantry and a pre procedural CT
was performed re-demonstrating the known abscess/fluid collection
within the gallbladder fossa with dominant component within the
medial aspect of the gallbladder fossa measuring approximately 4.2 x
3.8 cm (image 20, series 503,) additional collection within the more
lateral aspect of the gallbladder fossa measuring approximately
x 1.9 cm (image 17, series 503) and additional collection within
about the posteromedial aspect of the right lobe of the liver
measuring approximately 4.7 x 4.4 cm (image 15, series 503).

The CT gantry table position was marked and all 3 collections were
identified sonographically. The procedure was planned. Timeout was
performed prior to the initiation of the procedure.

The skin overlying the right upper abdominal quadrant was prepped
and draped in the usual sterile fashion. The overlying soft tissues
were anesthetized with 1% lidocaine with epinephrine.

Under direct ultrasound guidance, the dominant collection about the
posteromedial aspect of the right lobe of the liver as well as the
lateral component within the gallbladder fossa were both targeted
with 18 gauge trocar needles. Both collections were cannulated with
short Amplatz wires. Appropriate positioning was confirmed with CT
imaging (series 503).

Both tracks were dilated allowing placement of 10 French
percutaneous drainage catheters at both locations. Appropriate
position was confirmed with CT imaging (series 504).

Only approximately 10 cc of blood tinged fluid was aspirated from
the component within the lateral aspect of the gallbladder lumen
which did not appear to communicate with the more dominant
collection within the more medial aspect of the gallbladder lumen.

Approximately 15 cc of bloody fluid was aspirated from the
collection about the posteromedial aspect the right lobe of the
liver.

At this time, the remaining collection within the more medial aspect
of the gallbladder fossa was targeted with an 18 gauge trocar needle
allowing for cannulation with a short Amplatz wire. Appropriate
position was confirmed with CT imaging (series 507).

Next, the track was dilated allowing placement of a 10 French
percutaneous catheter. Appropriate position was confirmed with CT
imaging (series 505). Next, approximately 15 cc of thick, bloody
fluid was aspirated from this dominant collection within the medial
aspect of the gallbladder lumen.

Given lack of output from the collection within the lateral aspect
of the gallbladder fossa, this drainage catheter was removed.

Both remaining drainage catheters were secured to the skin entrance
site within interrupted sutures and StatLock devices. Both drainage
catheters were flushed with a small amount of saline and connected
to KOTACHI bulbs. Dressings were applied. The patient tolerated the
procedure well without immediate postprocedural complication.
IMPRESSION: 1. Successful CT-guided drainage catheter placement into dominant
collection/hematoma within the medial aspect of the gallbladder
fossa yielding approximately 15 cc of thick, bloody fluid. A
representative sample of aspirated fluid was capped and sent to the
laboratory for analysis.
2. Successful CT-guided drainage catheter placement into dominant
collection/hematoma about the posteromedial aspect of the right lobe
of the liver yielding 15 cc of thick, bloody fluid.
3. Successful CT-guided aspiration of approximately 10 cc of thick,
bloody fluid from a smaller collection within the lateral aspect of
the gallbladder fossa.

PLAN:
- Both drainage catheters were connected to KOTACHI bulbs.

- The patient was instructed to flush both drainage catheters with
10 cc of normal saline twice per day and to remain diligent separate
records regarding daily output from each drainage catheter.

- The patient will return to the [REDACTED] this coming [REDACTED], [DATE], for an IV only CT of the abdomen
as well as drainage catheter evaluation and management.

Note, per my discussion with Dr. KOTACHI at the time of procedure
completion, if patient has little to no output from the drainage
catheters, they may be removed at the time of the clinic visit.

## 2021-03-17 IMAGING — CT CT PERC DRAIN PERITONEAL ABCESS
1 of 5 series · 9 of 32 positions shown, 15 images · non-contrast
Comparison: CT ABDOMEN PELVIS-[DATE];

INDICATION: History of laparoscopic cholecystectomy on [DATE] with
persistent abdominal pain, fever and chills, now with indeterminate
fluid collections within the gallbladder fossa and about the
inferior medial aspect the right lobe of the liver worrisome for
either hematomas versus contained biloma.

Patient presents today for image guided aspiration and/or drainage
catheter placement.
EXAM:
1. ULTRASOUND AND CT-GUIDED PERCUTANEOUS DRAINAGE CATHETER PLACEMENT
X2
2. ULTRASOUND AND CT-GUIDED ASPIRATION

[Series 2: i-spiral 5.0 b40f · axial · 0.88mm/px · z∈[+971,+1184]mm · 9 of 77 slices shown, 15 images]
[im 8/77  soft-tissue]
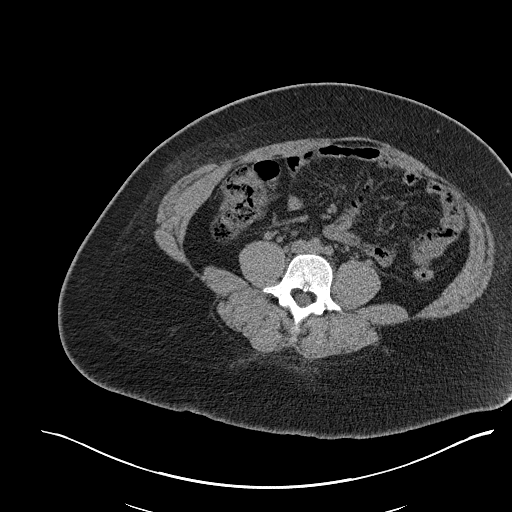
[im 8/77  bone]
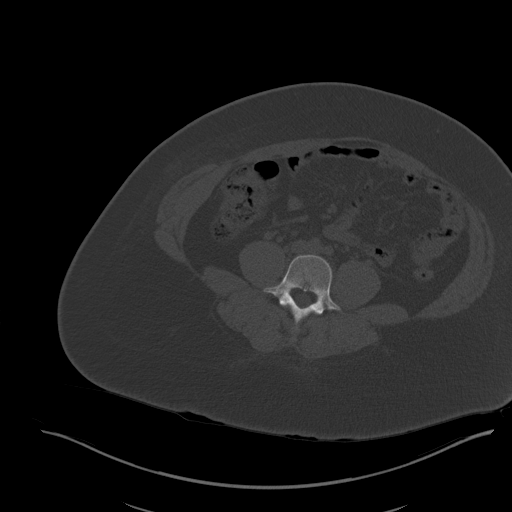
[im 16/77  soft-tissue]
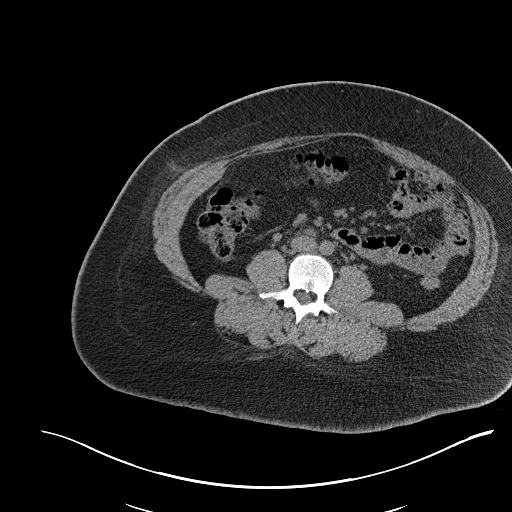
[im 23/77  soft-tissue]
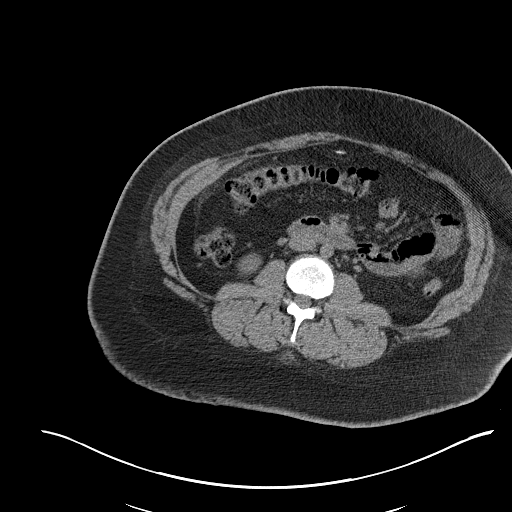
[im 31/77  soft-tissue]
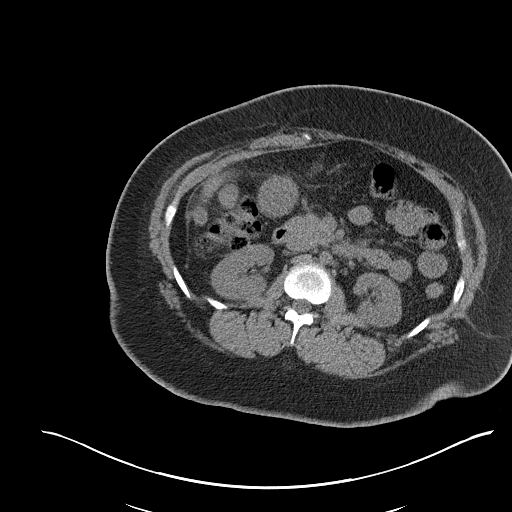
[im 39/77  soft-tissue]
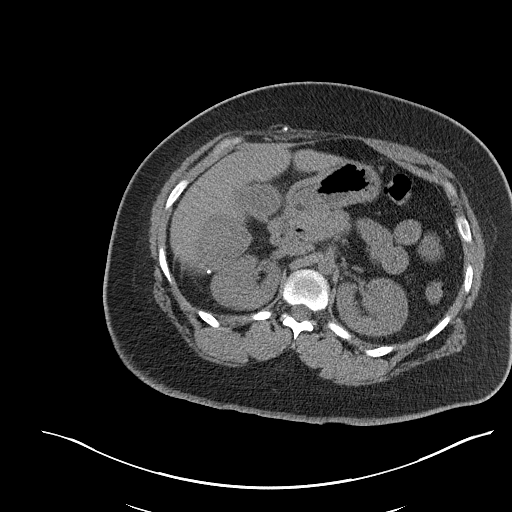
[im 46/77  soft-tissue]
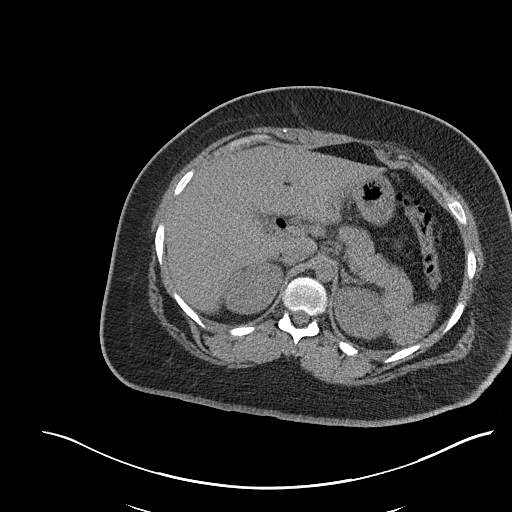
[im 46/77  lung]
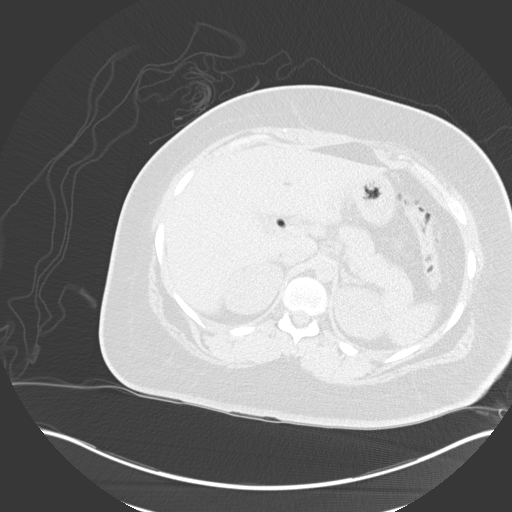
[im 54/77  soft-tissue]
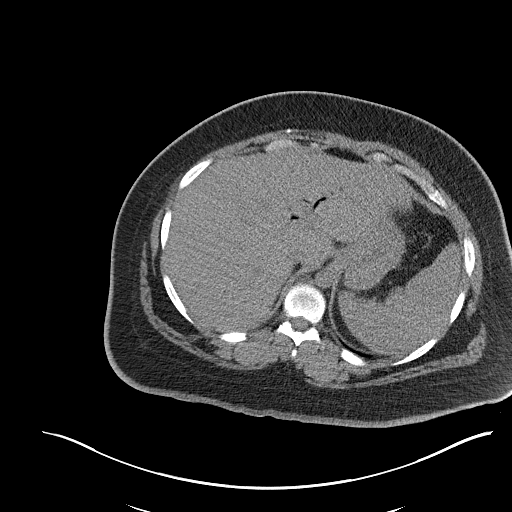
[im 54/77  lung]
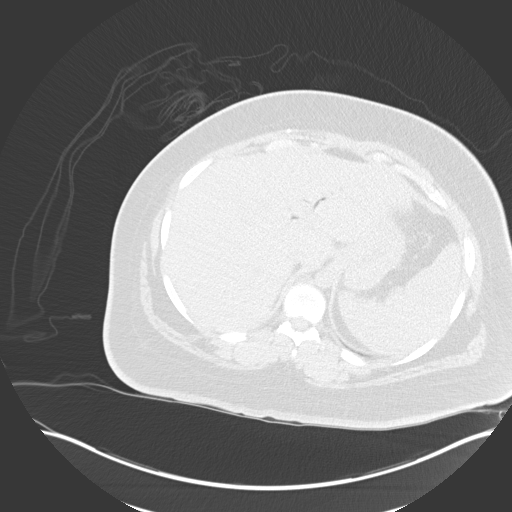
[im 61/77  soft-tissue]
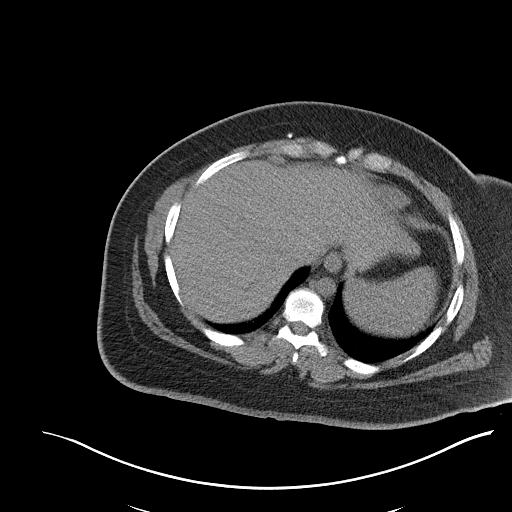
[im 61/77  lung]
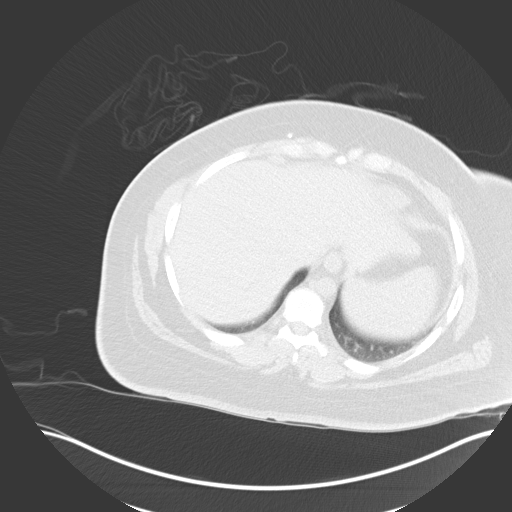
[im 69/77  soft-tissue]
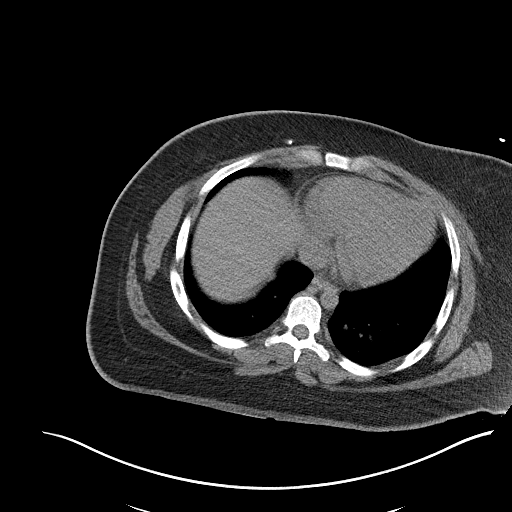
[im 69/77  lung]
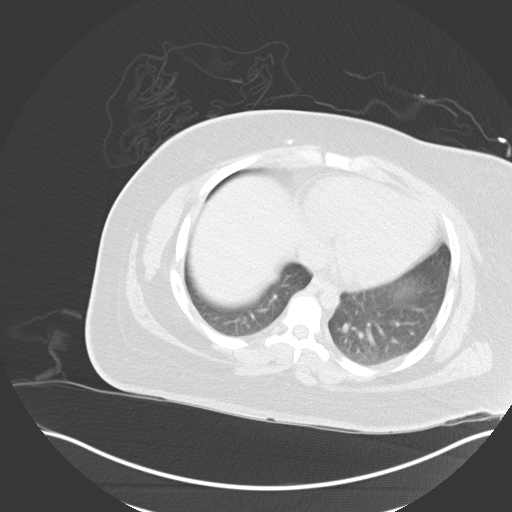
[im 69/77  bone]
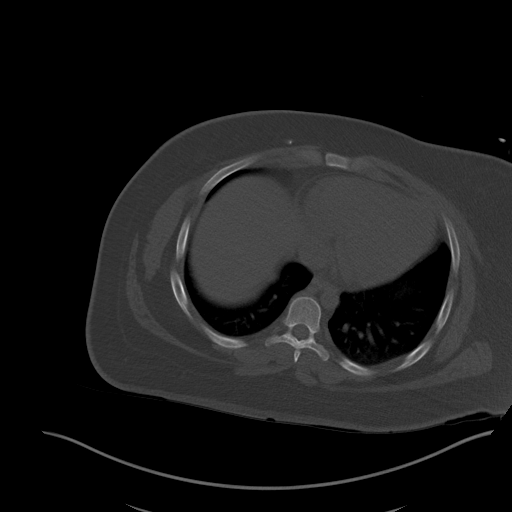

[9 of 32 positions shown; findings below may reference images not displayed]

ERCP-[DATE]

MEDICATIONS:
NONE

ANESTHESIA/SEDATION:
Moderate (conscious) sedation was employed during this procedure as
administered by the [REDACTED].

A total of Versed 4 mg and Fentanyl 200 mcg was administered
intravenously.

Moderate Sedation Time: 43 minutes. The patient's level of
consciousness and vital signs were monitored continuously by
radiology nursing throughout the procedure under my direct
supervision.

CONTRAST:  None

COMPLICATIONS:
None immediate.

PROCEDURE:
Informed written consent was obtained from the patient after a
discussion of the risks, benefits and alternatives to treatment. The
patient was placed supine on the CT gantry and a pre procedural CT
was performed re-demonstrating the known abscess/fluid collection
within the gallbladder fossa with dominant component within the
medial aspect of the gallbladder fossa measuring approximately 4.2 x
3.8 cm (image 20, series 503,) additional collection within the more
lateral aspect of the gallbladder fossa measuring approximately
x 1.9 cm (image 17, series 503) and additional collection within
about the posteromedial aspect of the right lobe of the liver
measuring approximately 4.7 x 4.4 cm (image 15, series 503).

The CT gantry table position was marked and all 3 collections were
identified sonographically. The procedure was planned. Timeout was
performed prior to the initiation of the procedure.

The skin overlying the right upper abdominal quadrant was prepped
and draped in the usual sterile fashion. The overlying soft tissues
were anesthetized with 1% lidocaine with epinephrine.

Under direct ultrasound guidance, the dominant collection about the
posteromedial aspect of the right lobe of the liver as well as the
lateral component within the gallbladder fossa were both targeted
with 18 gauge trocar needles. Both collections were cannulated with
short Amplatz wires. Appropriate positioning was confirmed with CT
imaging (series 503).

Both tracks were dilated allowing placement of 10 French
percutaneous drainage catheters at both locations. Appropriate
position was confirmed with CT imaging (series 504).

Only approximately 10 cc of blood tinged fluid was aspirated from
the component within the lateral aspect of the gallbladder lumen
which did not appear to communicate with the more dominant
collection within the more medial aspect of the gallbladder lumen.

Approximately 15 cc of bloody fluid was aspirated from the
collection about the posteromedial aspect the right lobe of the
liver.

At this time, the remaining collection within the more medial aspect
of the gallbladder fossa was targeted with an 18 gauge trocar needle
allowing for cannulation with a short Amplatz wire. Appropriate
position was confirmed with CT imaging (series 507).

Next, the track was dilated allowing placement of a 10 French
percutaneous catheter. Appropriate position was confirmed with CT
imaging (series 505). Next, approximately 15 cc of thick, bloody
fluid was aspirated from this dominant collection within the medial
aspect of the gallbladder lumen.

Given lack of output from the collection within the lateral aspect
of the gallbladder fossa, this drainage catheter was removed.

Both remaining drainage catheters were secured to the skin entrance
site within interrupted sutures and StatLock devices. Both drainage
catheters were flushed with a small amount of saline and connected
to KOTACHI bulbs. Dressings were applied. The patient tolerated the
procedure well without immediate postprocedural complication.
IMPRESSION: 1. Successful CT-guided drainage catheter placement into dominant
collection/hematoma within the medial aspect of the gallbladder
fossa yielding approximately 15 cc of thick, bloody fluid. A
representative sample of aspirated fluid was capped and sent to the
laboratory for analysis.
2. Successful CT-guided drainage catheter placement into dominant
collection/hematoma about the posteromedial aspect of the right lobe
of the liver yielding 15 cc of thick, bloody fluid.
3. Successful CT-guided aspiration of approximately 10 cc of thick,
bloody fluid from a smaller collection within the lateral aspect of
the gallbladder fossa.

PLAN:
- Both drainage catheters were connected to KOTACHI bulbs.

- The patient was instructed to flush both drainage catheters with
10 cc of normal saline twice per day and to remain diligent separate
records regarding daily output from each drainage catheter.

- The patient will return to the [REDACTED] this coming [REDACTED], [DATE], for an IV only CT of the abdomen
as well as drainage catheter evaluation and management.

Note, per my discussion with Dr. KOTACHI at the time of procedure
completion, if patient has little to no output from the drainage
catheters, they may be removed at the time of the clinic visit.

## 2021-03-17 MED ORDER — MIDAZOLAM HCL 2 MG/2ML IJ SOLN
INTRAMUSCULAR | Status: AC
  Filled 2021-03-17: qty 4

## 2021-03-17 MED ORDER — SODIUM CHLORIDE 0.9% FLUSH: 5.0000 mL | Freq: Three times a day (TID) | INTRAVENOUS | Status: DC

## 2021-03-17 MED ORDER — LIDOCAINE-EPINEPHRINE 1 %-1:100000 IJ SOLN
INTRAMUSCULAR | Status: AC
  Filled 2021-03-17: qty 2

## 2021-03-17 MED ORDER — OXYCODONE-ACETAMINOPHEN 5-325 MG PO TABS: 1.0000 | ORAL_TABLET | Freq: Four times a day (QID) | ORAL | 0 refills | Status: DC | PRN

## 2021-03-17 MED ORDER — HYDROMORPHONE HCL 1 MG/ML IJ SOLN
INTRAMUSCULAR | Status: AC
  Filled 2021-03-17: qty 1

## 2021-03-17 MED ORDER — HYDROMORPHONE HCL 1 MG/ML IJ SOLN
0.5000 mg | Freq: Once | INTRAMUSCULAR | Status: AC
  Administered 2021-03-17: 0.5 mg via INTRAVENOUS
  Filled 2021-03-17: qty 0.5

## 2021-03-17 MED ORDER — MIDAZOLAM HCL 2 MG/2ML IJ SOLN
INTRAMUSCULAR | Status: AC | PRN
  Administered 2021-03-17: 1 mg via INTRAVENOUS
  Administered 2021-03-17: .5 mg via INTRAVENOUS
  Administered 2021-03-17: 1 mg via INTRAVENOUS
  Administered 2021-03-17 (×3): .5 mg via INTRAVENOUS

## 2021-03-17 MED ORDER — FENTANYL CITRATE (PF) 100 MCG/2ML IJ SOLN
INTRAMUSCULAR | Status: AC
  Filled 2021-03-17: qty 4

## 2021-03-17 MED ORDER — FENTANYL CITRATE (PF) 100 MCG/2ML IJ SOLN
50.0000 ug | Freq: Once | INTRAMUSCULAR | Status: AC
  Administered 2021-03-17: 50 ug via INTRAVENOUS

## 2021-03-17 MED ORDER — HYDROCODONE-ACETAMINOPHEN 5-325 MG PO TABS
1.0000 | ORAL_TABLET | ORAL | Status: DC | PRN
  Filled 2021-03-17: qty 2

## 2021-03-17 MED ORDER — SODIUM CHLORIDE 0.9 % IV SOLN: INTRAVENOUS | Status: DC

## 2021-03-17 MED ORDER — FENTANYL CITRATE (PF) 100 MCG/2ML IJ SOLN
INTRAMUSCULAR | Status: AC | PRN
  Administered 2021-03-17 (×3): 25 ug via INTRAVENOUS
  Administered 2021-03-17 (×2): 50 ug via INTRAVENOUS
  Administered 2021-03-17: 25 ug via INTRAVENOUS

## 2021-03-17 MED ORDER — FENTANYL CITRATE PF 50 MCG/ML IJ SOSY
PREFILLED_SYRINGE | INTRAMUSCULAR | Status: AC
  Filled 2021-03-17: qty 1

## 2021-03-17 MED ORDER — NORMAL SALINE FLUSH 0.9 % IV SOLN
5.0000 mL | Freq: Every day | INTRAVENOUS | 0 refills | Status: DC
  Filled 2021-03-17: qty 30, 3d supply, fill #0

## 2021-03-17 NOTE — Procedures (Signed)
Pre procedural Dx: Post op fluid collections Post procedural Dx: Same  Technically successful image guided placed of a 10 Fr drainage catheter placement into the dominant collection within the gallbladder lumen yielding 15 cc of bloody fluid. Technically successful image guided placed of a 10 Fr drainage catheter placement into the dominant collection adjacent to the posterior medial aspect of the right lobe of the liver yielding 15 cc of bloody fluid. Technically successful image guided aspiration of 1 cc of bloody fluid from smaller collection within the lateral aspect of the gallbladder fossa.  A representative aspirated sample was capped and sent to the laboratory for analysis.    EBL: Trace Complications: None immediate  Katherina Right, MD Pager #: [phone]

## 2021-03-17 NOTE — H&P (Signed)
Chief Complaint: Patient was seen in consultation today for No chief complaint on file.  at the request of Gosai,Puja  Referring Physician(s): Hedda Slade  Supervising Physician: Simonne Come  Patient Status: [facility] - Out-pt  History of Present Illness: Martha Hall is a 25 y.o. female with history of cholecystectomy on 9/23.  She presents today with persistent nausea and RUQ pain.  Imaging suggests biloma/hematoma and is scheduled for aspiration and drain placement.  She expresses concern of need for inpatient stay as she has a child at home and no childcare during the day.  Additionally, she is concerned about missing any more work and requests work note.      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 Per consultation with Dr. Earlene Plater, no further work-up necessary. Per MFM: " The patient was reassured that the anti-Lewis antibodies are usually of the IgM subtype and therefore we will not cross the placenta and cause fetal anemia.  The Lewis antibodies should therefore not affect the management of her current pregnancy."   Vaginal trichomoniasis 05/09/2019   04/30/2019 on Pap [] tx [] TOC- neg    Past Surgical History:  Procedure Laterality Date   BRAIN SURGERY  07-11-1995   stint placed when born   CHOLECYSTECTOMY N/A 01/25/2021   Procedure: LAPAROSCOPIC CHOLECYSTECTOMY WITH ATTEMPTED CHOLANGIOGRAM;  Surgeon: 12/1997, MD;  Location: [facility] OR;  Service: General;  Laterality: N/A;   ERCP N/A 01/29/2021   Procedure: ENDOSCOPIC RETROGRADE CHOLANGIOPANCREATOGRAPHY (ERCP);  Surgeon: [facility], MD;  Location: [facility] ENDOSCOPY;  Service: Endoscopy;  Laterality: N/A;   REMOVAL OF STONES  01/29/2021   Procedure: REMOVAL OF STONES;  Surgeon:  [facility], MD;  Location: [facility] ENDOSCOPY;  Service: Endoscopy;;   SPHINCTEROTOMY  01/29/2021   Procedure: [facility];  Surgeon: 01/31/2021, MD;  Location: [facility] ENDOSCOPY;  Service: Endoscopy;;    Allergies: Metoclopramide  Medications: Prior to Admission medications   Medication Sig Start Date End Date Taking? Authorizing Provider  acetaminophen (TYLENOL) 500 MG tablet Take 1,000 mg by mouth every 6 (six) hours as needed for moderate pain or fever.   Yes [provider]  esomeprazole (NEXIUM) 20 MG capsule Take 20 mg by mouth daily before breakfast.   Yes [provider]  famotidine (PEPCID) 20 MG tablet Take 1 tablet (20 mg total) by mouth 2 (two) times daily. 01/21/21  Yes [facility], PA-C  ibuprofen (ADVIL) 800 MG tablet Take 800 mg by mouth every 8 (eight) hours as needed for headache.   Yes [provider]  meclizine (ANTIVERT) 25 MG tablet Take 25 mg by mouth 3 (three) times daily as needed for dizziness.   Yes [provider]  prochlorperazine (COMPAZINE) 10 MG tablet Take 10 mg by mouth every 8 (eight) hours as needed for nausea or vomiting.   Yes [provider]  tiZANidine (ZANAFLEX) 4 MG tablet Take 1 tablet (4 mg total) by mouth every 8 (eight) hours as needed for muscle spasms. 12/19/20  Yes Wallis Bamberg, PA-C  diphenhydrAMINE (BENADRYL) 25 MG tablet Take 12.5 mg by mouth every 6 (six) hours as needed for allergies. Patient not taking: Reported on 12/30/2019  06/21/20  [provider]     Family History  Problem Relation Age of Onset   Healthy Mother    Healthy Father     Social History   Socioeconomic History   Marital status: Single    Spouse name: Not on file   Number of children: Not on file   Years of education: Not on file   Highest education level: Associate degree: occupational, Scientist, product/process development, or vocational program  Occupational History   Occupation: Customer Service    Comment: [employer]  Tobacco  Use   Smoking status: Never   Smokeless tobacco: Never  Vaping Use   Vaping Use: Never used  Substance and Sexual Activity   Alcohol use: Never   Drug use: Never   Sexual activity: Not Currently    Comment: undecided  Other Topics Concern   Not on file  Social History Narrative   Not on file   Social Determinants of Health   Financial Resource Strain: Not on file  Food Insecurity: Not on file  Transportation Needs: Not on file  Physical Activity: Not on file  Stress: Not on file  Social Connections: Not on file    Review of Systems: A 12 point ROS discussed and pertinent positives are indicated in the HPI above.  All other systems are negative.  Review of Systems  Constitutional:  Positive for appetite change.  HENT: Negative.    Eyes: Negative.   Respiratory: Negative.    Cardiovascular: Negative.   Gastrointestinal:  Positive for abdominal pain and nausea. Negative for constipation, diarrhea and vomiting.  Endocrine: Negative.   Genitourinary: Negative.   Musculoskeletal: Negative.   Skin: Negative.   Allergic/Immunologic: Negative.   Neurological: Negative.   Hematological: Negative.   Psychiatric/Behavioral: Negative.     Vital Signs: LMP 03/02/2021 (Exact Date)   Physical Exam Constitutional:      General: She is not in acute distress.    Appearance: Normal appearance. She is not ill-appearing or toxic-appearing.  HENT:     Head: Normocephalic and atraumatic.     Nose: Nose normal.     Mouth/Throat:     Mouth: Mucous membranes are moist.     Pharynx: Oropharynx is clear.  Eyes:     Extraocular Movements: Extraocular movements intact.  Cardiovascular:     Rate and Rhythm: Normal rate and regular rhythm.     Pulses: Normal pulses.     Heart sounds: Normal heart sounds.  Pulmonary:     Effort: Pulmonary effort is normal.     Breath sounds: Normal breath sounds.  Abdominal:     Palpations: Abdomen is soft.     Tenderness: There is abdominal  tenderness.  Musculoskeletal:        General: Normal range of motion.     Cervical back: Neck supple.  Skin:    General: Skin is warm and dry.  Neurological:     General: No focal deficit present.     Mental Status: She is alert and oriented to person, place, and time.  Psychiatric:        Mood and Affect: Mood normal.        Thought Content: Thought content normal.    Imaging: CT ABDOMEN PELVIS W CONTRAST  Result Date: 03/04/2021 CLINICAL DATA:  Abdominal pain, fevers EXAM: CT ABDOMEN AND PELVIS WITH CONTRAST TECHNIQUE: Multidetector CT imaging of the abdomen and pelvis was performed using the standard protocol following bolus administration of intravenous contrast. CONTRAST:  67mL OMNIPAQUE IOHEXOL 350 MG/ML SOLN COMPARISON:  CT 01/26/2021, ERCP 01/29/2021 FINDINGS: Lower chest: No acute  abnormality. Hepatobiliary: No focal liver abnormality is seen. Prior cholecystectomy. There are persistent heterogeneous fluid collections in the right upper quadrant along the gallbladder fossa and inferior right liver edge, unclear if all communicating or two separate collections, measuring up to 4.8 and 4.5 cm short axis (series 2, images 25-32). There are persistent mild adjacent inflammatory changes in the right upper quadrant. Pancreas: Unremarkable. No pancreatic ductal dilatation or surrounding inflammatory changes. Spleen: Normal in size without focal abnormality. Adrenals/Urinary Tract: Adrenal glands are unremarkable. No hydronephrosis or nephrolithiasis. Bladder is unremarkable. Stomach/Bowel: The stomach is within normal limits. There is no evidence of bowel obstruction. The appendix is normal. Vascular/Lymphatic: No significant vascular findings are present. No enlarged abdominal or pelvic lymph nodes. Reproductive: Dominant left ovarian follicle. Unremarkable appearance for age. Other: Prior midline incision. No hernia. Resolved hemoperitoneum elsewhere in the abdomen and pelvis since the prior  exam. No other free fluid other than within the collections described above. No free intraperitoneal gas. Musculoskeletal: No acute or significant osseous findings. IMPRESSION: Persistent hematoma in the right upper quadrant along the gallbladder fossa and inferior right liver edge. This appears more well-defined in comparison to prior exam and now with a thick enhancing rim, and with persistent mild adjacent inflammatory changes. Infected hematoma is possible. Resolved hemoperitoneum elsewhere in the abdomen and pelvis since the prior exam. These results will be called to the ordering clinician or representative by the Radiologist Assistant, and communication documented in the PACS or [employer]. Electronically Signed   By: Caprice Renshaw M.D.   On: 03/04/2021 08:28    Labs:  CBC: Recent Labs    01/30/21 0418 01/31/21 0421 02/04/21 1153 03/17/21 0918  WBC 7.6 8.6 8.4 5.5  HGB 8.7* 9.5* 9.5* 12.4  HCT 26.7* 29.1* 29.9* 40.4  PLT 287 311 406* 244    COAGS: Recent Labs    01/26/21 1427 01/27/21 1528  INR 1.1 1.1  APTT 23*  --     BMP: Recent Labs    01/29/21 0312 01/30/21 0205 01/31/21 0421 02/04/21 1153  NA 137 138 137 137  K 3.7 3.9 3.6 3.8  CL 106 106 106 108  CO2 23 22 24 23   GLUCOSE 93 77 107* 101*  BUN 5* 8 10 8   CALCIUM 8.6* 8.7* 8.6* 8.7*  CREATININE 0.83 0.85 0.83 0.68  GFRNONAA >60 >60 >60 >60    LIVER FUNCTION TESTS: Recent Labs    01/29/21 0312 01/30/21 0205 01/31/21 0421 02/04/21 1153  BILITOT 7.1* 3.3* 2.3* 1.9*  AST 165* 196* 94* 26  ALT 240* 285* 233* 62*  ALKPHOS 157* 166* 137* 109  PROT 5.5* 5.8* 5.7* 6.5  ALBUMIN 2.4* 2.5* 2.4* 2.8*    Assessment and Plan:  Postoperative fluid collection after cholecystectomy --OK to proceed with planned aspiration/drain placement --Pt is NPO and on no blood thinners, has driver and person to stay with her overnight --plan for d/c to home this afternoon  Risks and benefits discussed with the  patient including bleeding, infection, damage to adjacent structures, bowel perforation/fistula connection, and sepsis.  All of the patient's questions were answered, patient is agreeable to proceed. Consent signed and in chart.   Thank you for this interesting consult.  I greatly enjoyed meeting Martha Hall and look forward to participating in their care.  A copy of this report was sent to the requesting provider on this date.  Electronically Signed: 02/02/21, PA 03/17/2021, 10:48 AM   I spent a total of 40 Minutes  in face to face in clinical consultation, greater than 50% of which was counseling/coordinating care for post operative fluid collection

## 2021-03-17 NOTE — Progress Notes (Addendum)
Beverely Risen notified of client c/o pain 9/10 and order noted and pain med given

## 2021-03-17 NOTE — Progress Notes (Signed)
Martha Hall in to see client and explained dressing changes and flushing jp drains to client and her grandmother and they voiced understanding

## 2021-03-23 ENCOUNTER — Ambulatory Visit
Admission: RE | Admit: 2021-03-23 | Discharge: 2021-03-23 | Disposition: A | Discharge status: Home / Self Care | Payer: Medicaid Other | Source: Ambulatory Visit | Attending: Surgery | Admitting: Surgery

## 2021-03-23 ENCOUNTER — Other Ambulatory Visit: Payer: Self-pay

## 2021-03-23 DIAGNOSIS — Z9049 Acquired absence of other specified parts of digestive tract: Secondary | ICD-10-CM | POA: Diagnosis not present

## 2021-03-23 DIAGNOSIS — R188 Other ascites: Secondary | ICD-10-CM

## 2021-03-23 DIAGNOSIS — K6811 Postprocedural retroperitoneal abscess: Secondary | ICD-10-CM | POA: Diagnosis not present

## 2021-03-23 DIAGNOSIS — Z4682 Encounter for fitting and adjustment of non-vascular catheter: Secondary | ICD-10-CM | POA: Diagnosis not present

## 2021-03-23 HISTORY — PX: IR RADIOLOGIST EVAL & MGMT: IMG5224

## 2021-03-23 IMAGING — CT CT ABDOMEN W/ CM
2 of 4 series · 15 of 46 positions shown, 17 images · IV contrast (iopamidol)
Comparison: [DATE], [DATE]

CLINICAL DATA: 25-year-old female with history of biloma status
post cholecystectomy requiring 2 separate percutaneous biloma drains
which replaced on [DATE]. Follow-up study.

EXAM:
CT ABDOMEN WITH CONTRAST
TECHNIQUE: Multidetector CT imaging of the abdomen was performed using the
standard protocol following bolus administration of intravenous
contrast.
CONTRAST:  100mL [U6] IOPAMIDOL ([U6]) INJECTION 61%

[Series 2: abd with 5.00 br40 s3 axial · axial · 0.82mm/px · z∈[+1429,+1679]mm · 12 of 56 slices shown, 14 images]
[im 3/56  soft-tissue]
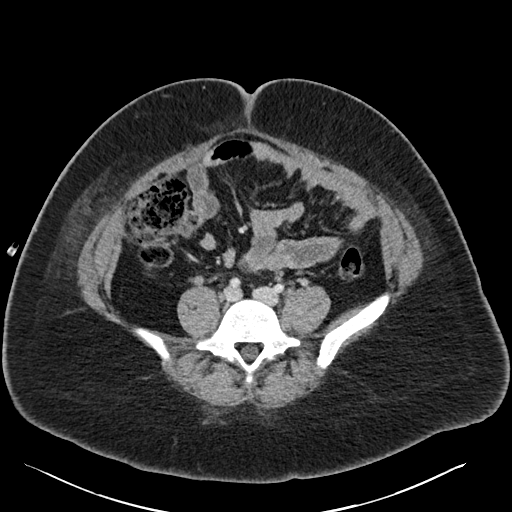
[im 3/56  bone]
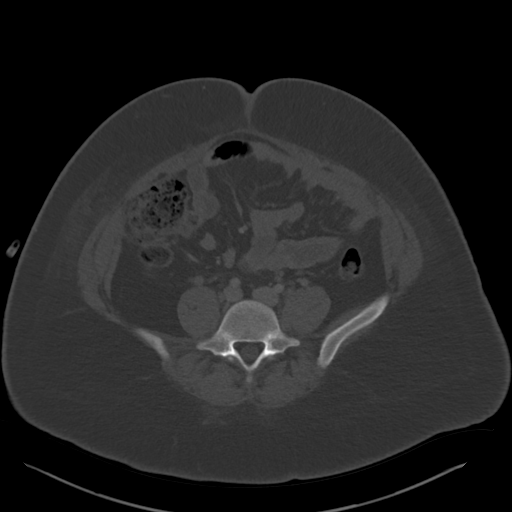
[im 8/56  soft-tissue]
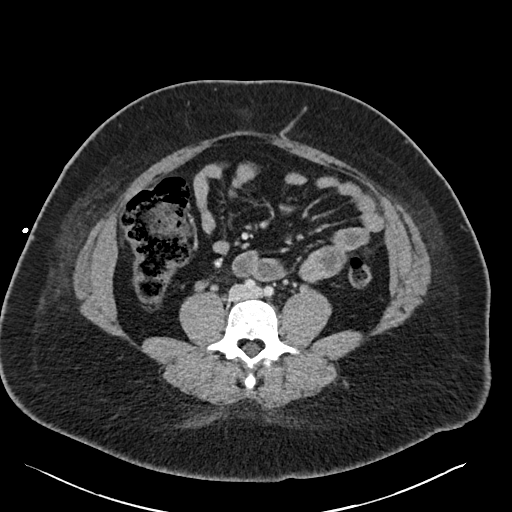
[im 13/56  soft-tissue]
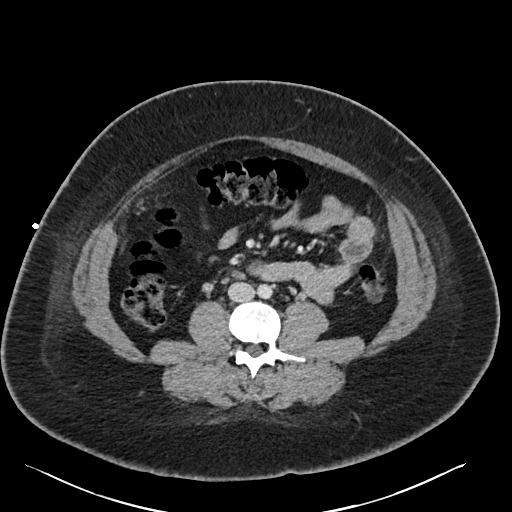
[im 18/56  soft-tissue]
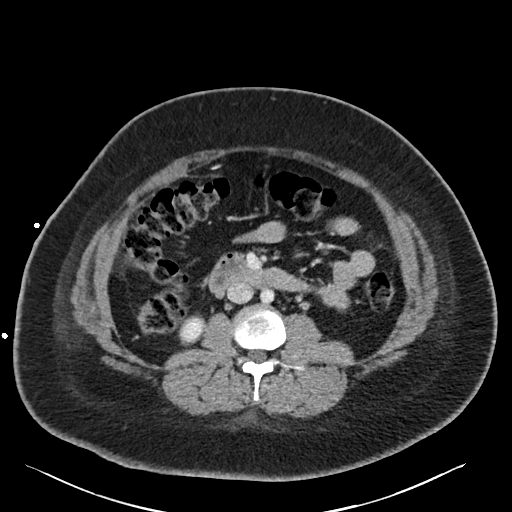
[im 21/56  soft-tissue]
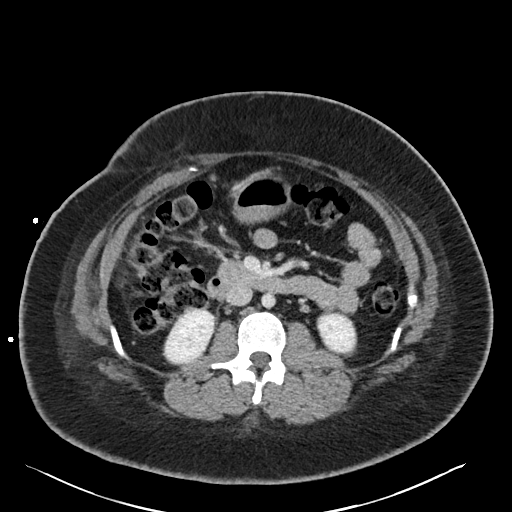
[im 26/56  soft-tissue]
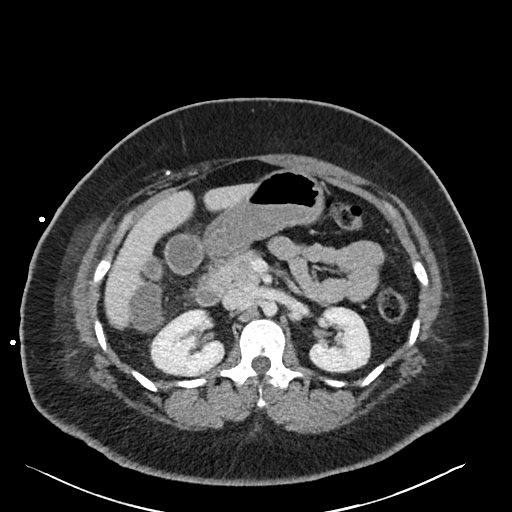
[im 31/56  soft-tissue]
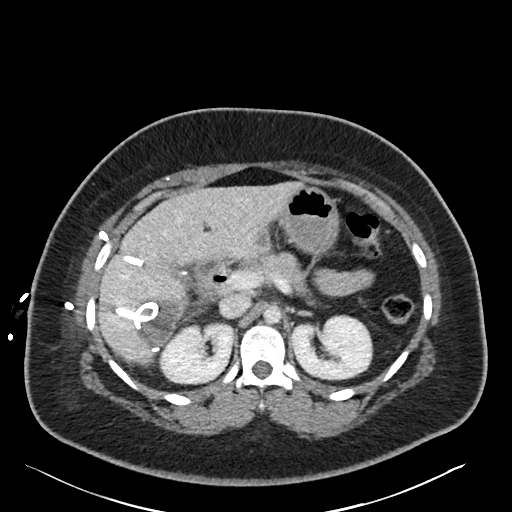
[im 36/56  soft-tissue]
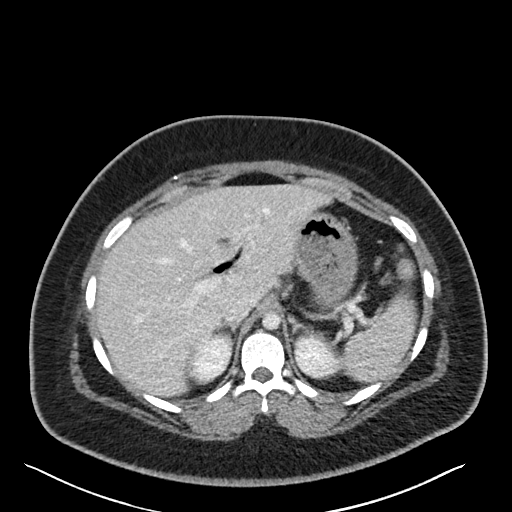
[im 38/56  soft-tissue]
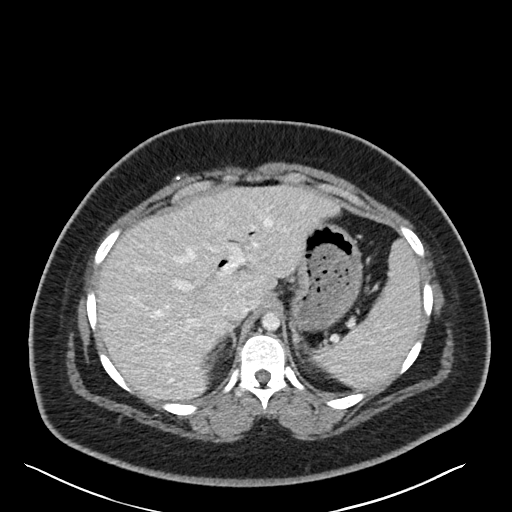
[im 38/56  bone]
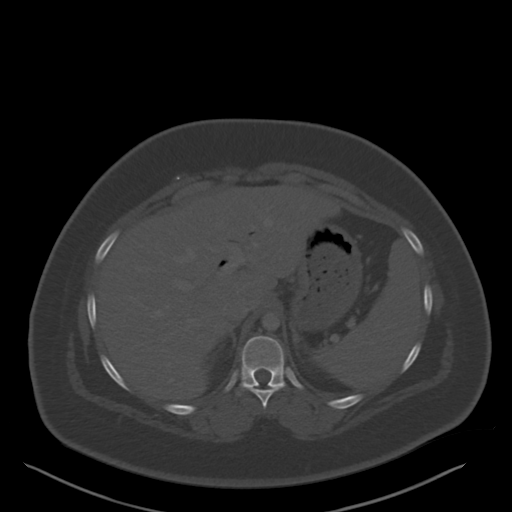
[im 43/56  soft-tissue]
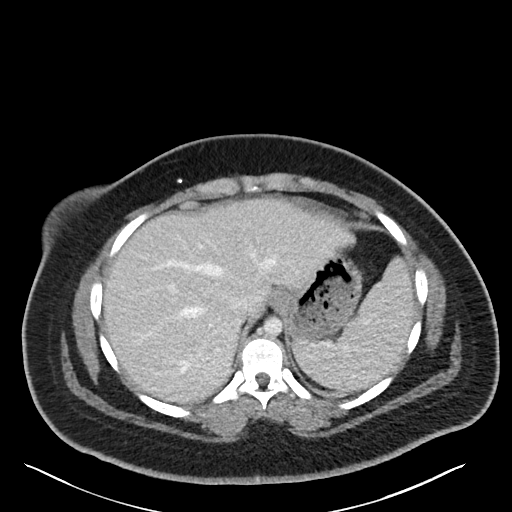
[im 48/56  soft-tissue]
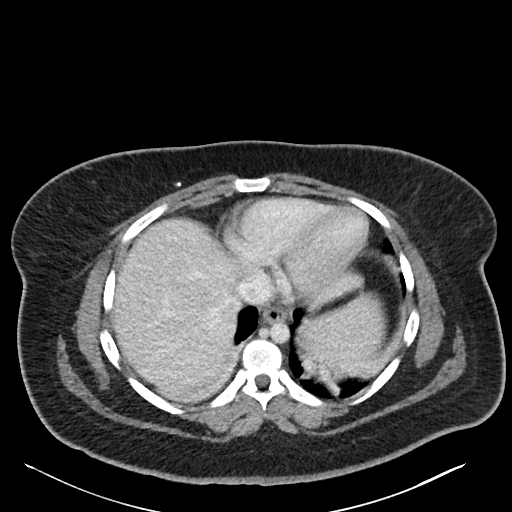
[im 53/56  soft-tissue]
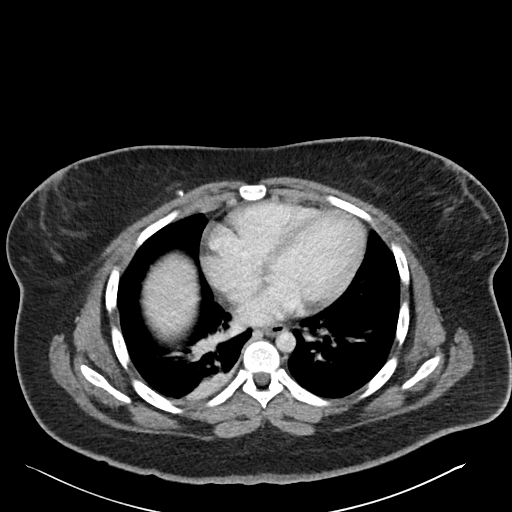

[Series 8: abd with 2.00 br40 s3 cor · coronal · 0.55mm/px · 3 of 210 slices shown]
[im 70/210  soft-tissue]
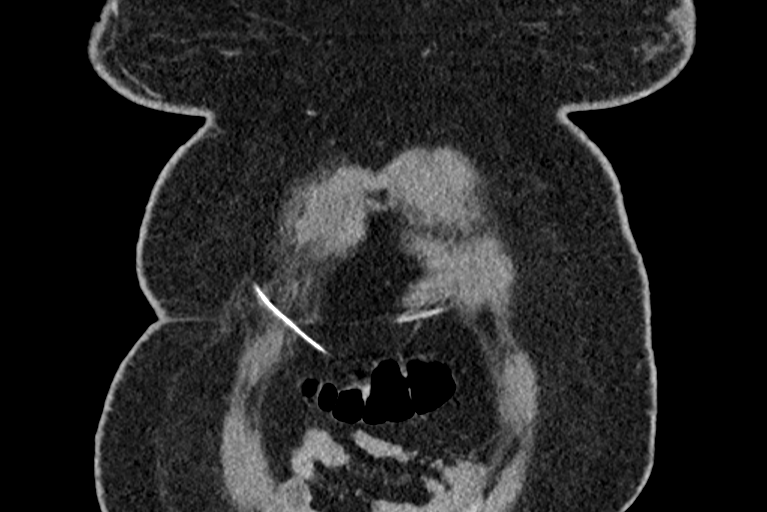
[im 93/210  soft-tissue]
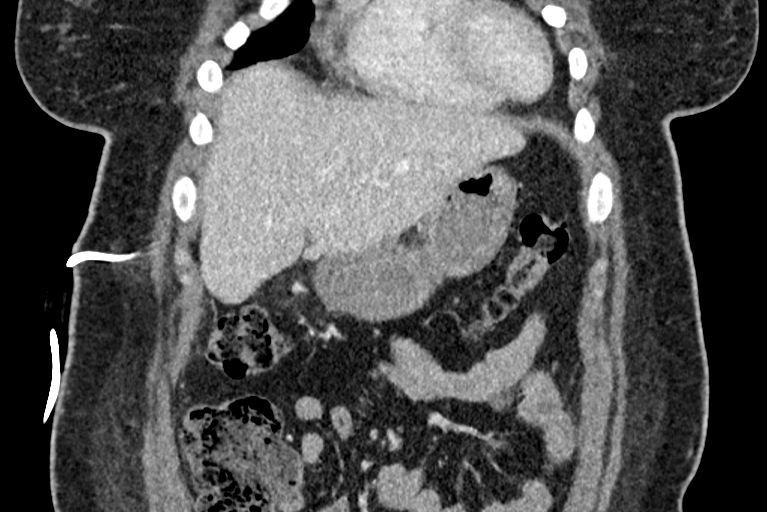
[im 117/210  soft-tissue]
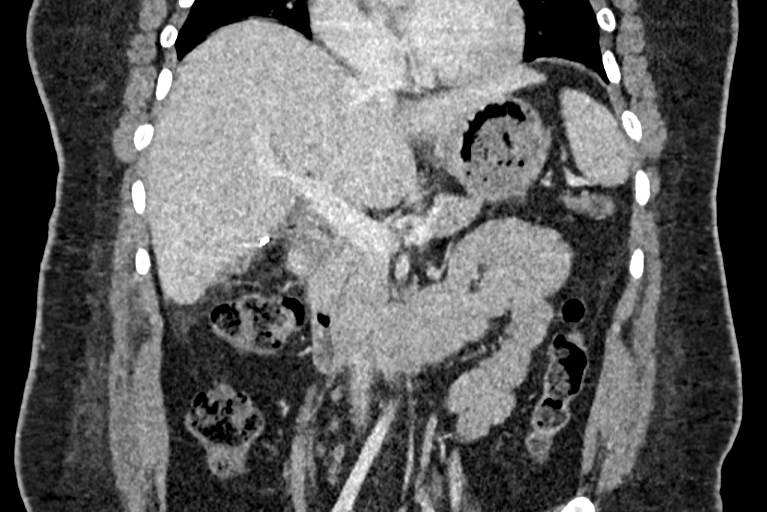

[15 of 46 positions shown; findings below may reference images not displayed]

FINDINGS: Lower chest: Bibasilar subsegmental atelectasis. The heart is normal
in size. No pericardial effusion.

Hepatobiliary: The liver is normal in size, contour, and
attenuation. No focal lesions. The gallbladder surgically absent.
Again seen are fluid collections in gallbladder fossa with
indwelling pigtail biloma drains. Most posterior fluid collection
measures approximately 4.0 x 4.1 cm in maximum axial dimension,
previously 4.8 x 4.5 cm. The more anterior fluid collection measures
approximately 3.4 x 3.2 cm, previously 3.7 x 4.0 cm. No new fluid
collections. Again seen is trace pneumobilia, similar to comparison.

Pancreas: Unremarkable. No pancreatic ductal dilatation or
surrounding inflammatory changes.

Spleen: Normal in size without focal abnormality.

Adrenals/Urinary Tract: Adrenal glands are unremarkable. Kidneys are
normal, without renal calculi, focal lesion, or hydronephrosis.

Stomach/Bowel: Stomach is within normal limits. No evidence of bowel
wall thickening, distention, or inflammatory changes.

Vascular/Lymphatic: No significant vascular findings are present. No
enlarged abdominal lymph nodes.

Other: No abdominal wall hernia or abnormality. No ascites.

Musculoskeletal: No acute or significant osseous findings.
IMPRESSION: 1. Decreased size of previously visualized subhepatic fluid
collections with indwelling drains in appropriate positions.
2. Bibasilar subsegmental atelectasis.

## 2021-03-23 IMAGING — RF DG SINUS / FISTULA TRACT / ABSCESSOGRAM
4 series · 14 of 14 positions shown · non-contrast
Comparison: CT abdomen pelvis from earlier the same day,
[DATE], [DATE]

CLINICAL DATA: 25-year-old female with sub DYLAN fluid
collections status post cholecystectomy requiring 2 separate
percutaneous drains which replaced on [DATE]. CT demonstrates
persistent fluid collections, although smaller. Drain injection
indicated to assess for fistulization with the biliary tree.

EXAM:
ABSCESS INJECTION
TECHNIQUE: The patient was positioned supine on the fluoroscopy table.

[Series 1: sequence · 4 of 54 frames shown (1 of 2)]
[frame 9/54]
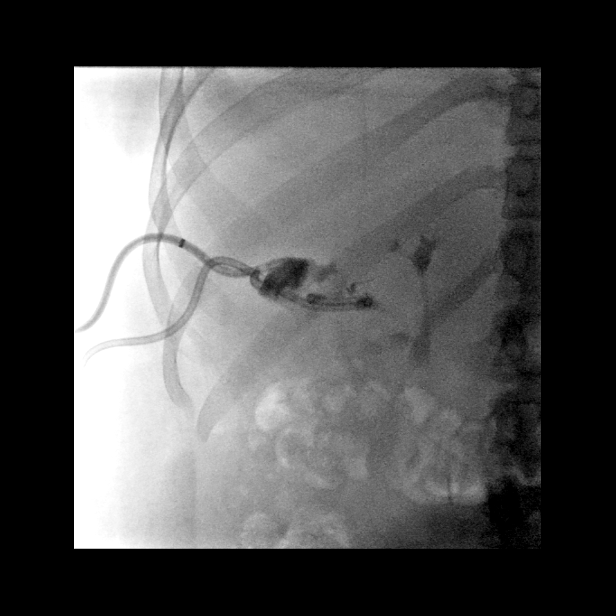
[frame 28/54]
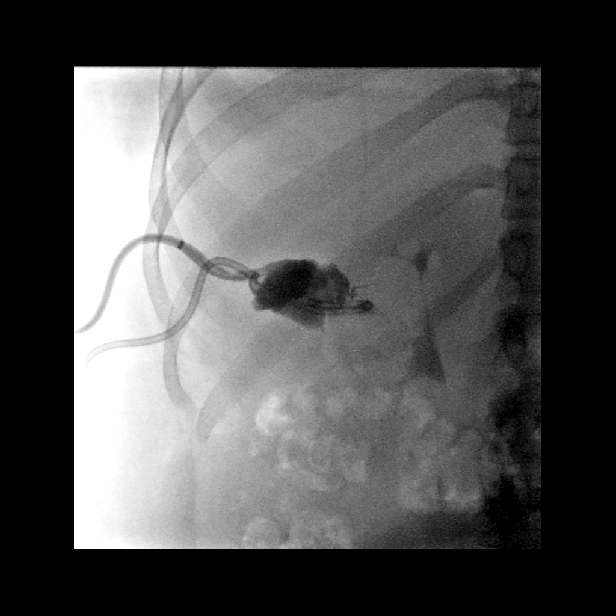
[frame 41/54]
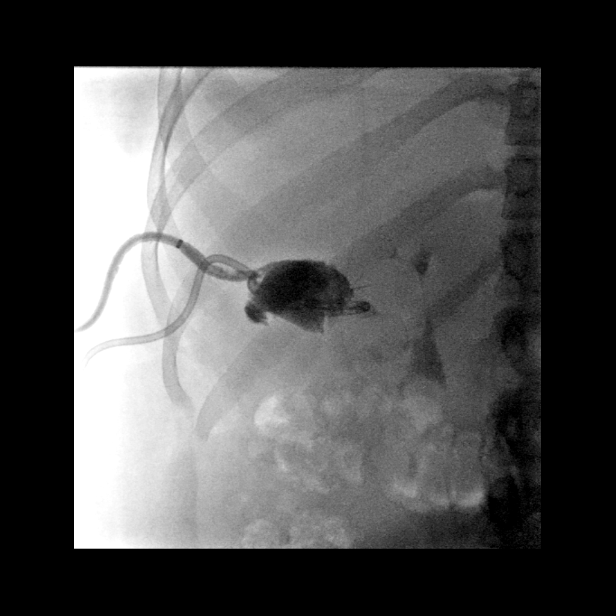
[frame 46/54]
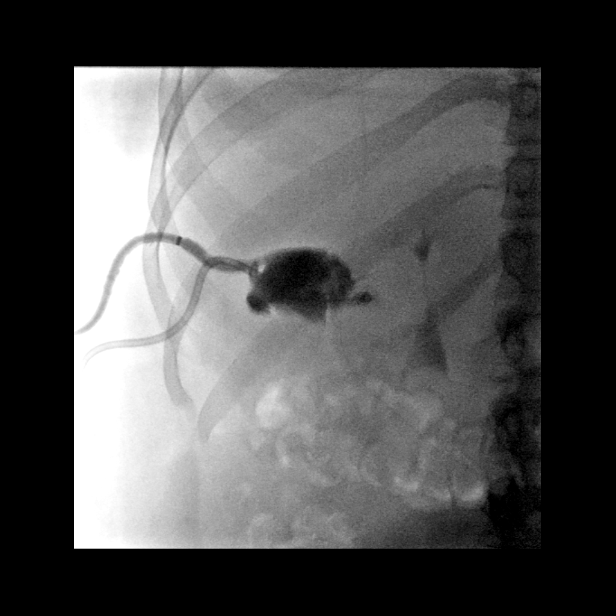

[Series 2: one shot · 0.15mm/px · 3 of 3 slices shown (1 of 2)]
[im 1/3]
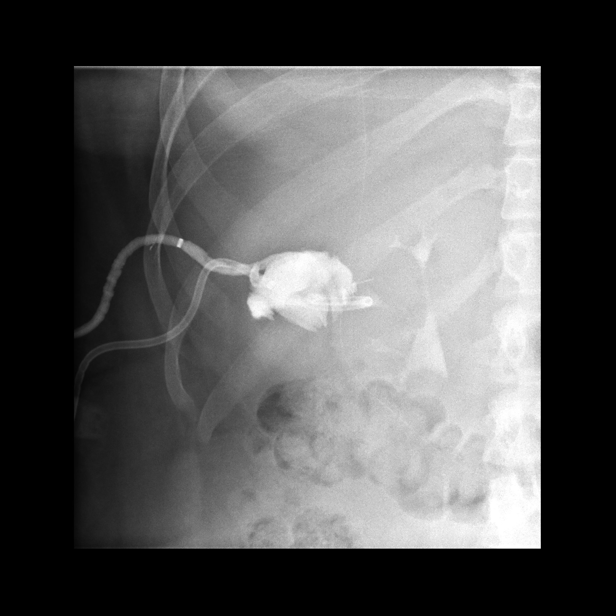
[im 2/3]
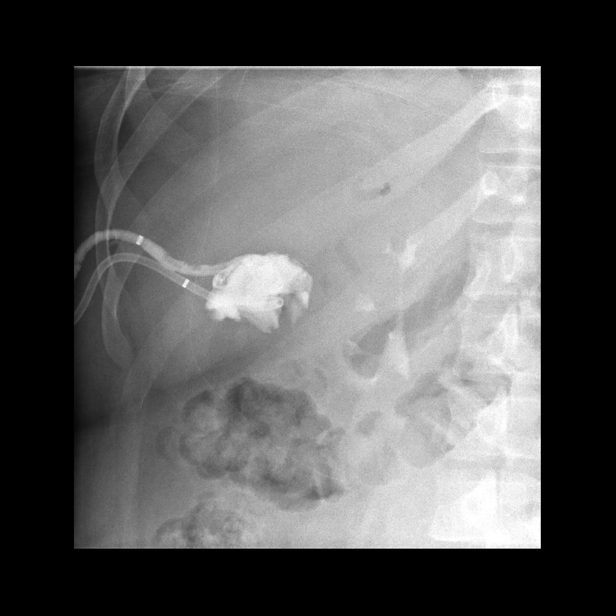
[im 3/3]
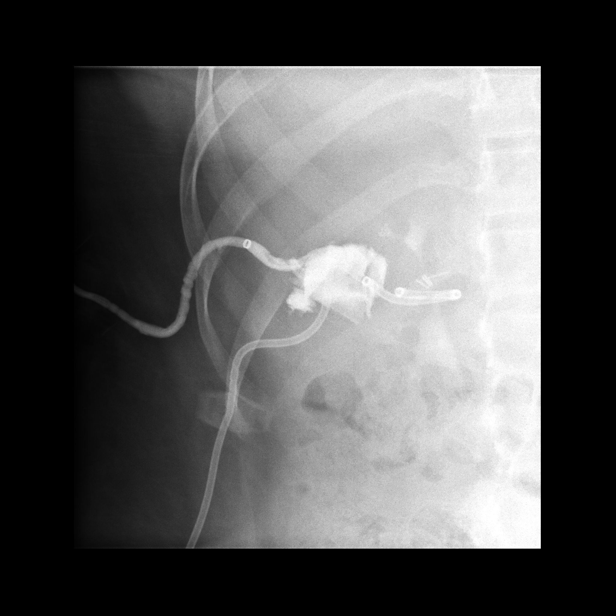

[Series 3: sequence · 4 of 94 frames shown (2 of 2)]
[frame 15/94]
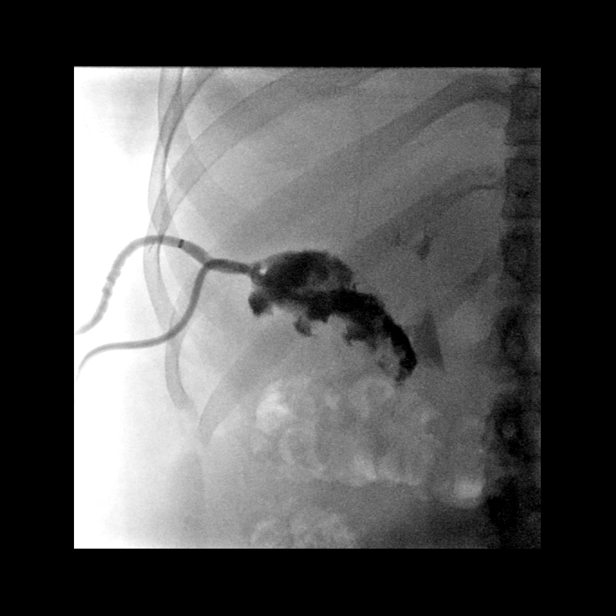
[frame 25/94]
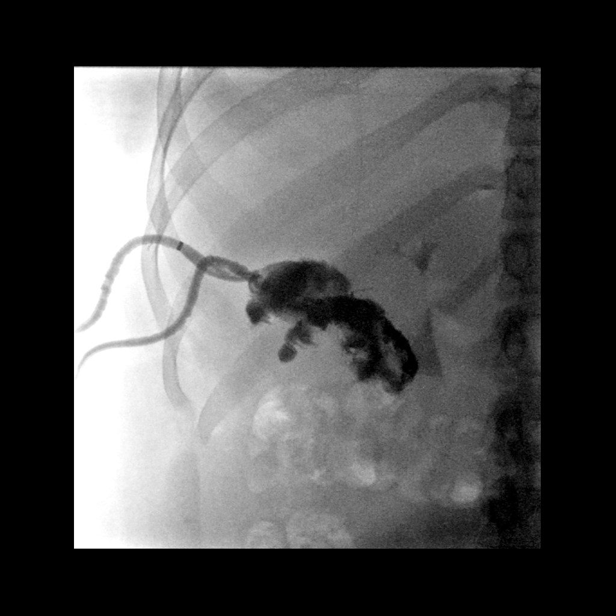
[frame 48/94]
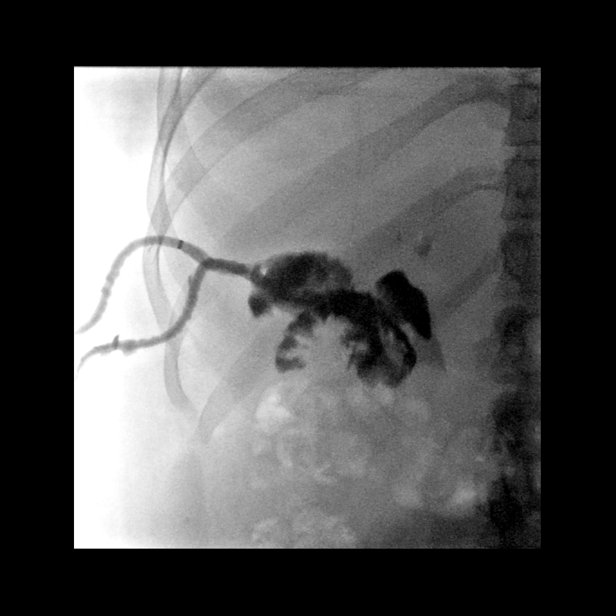
[frame 80/94]
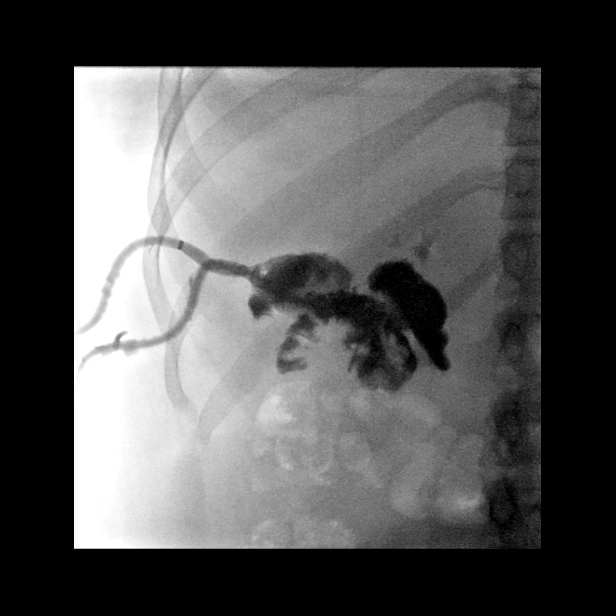

[Series 4: one shot · 0.15mm/px · 3 of 3 slices shown (2 of 2)]
[im 1/3]
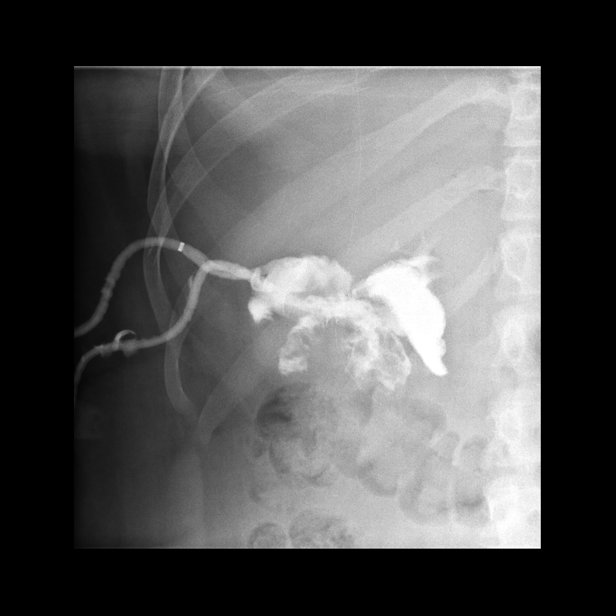
[im 2/3]
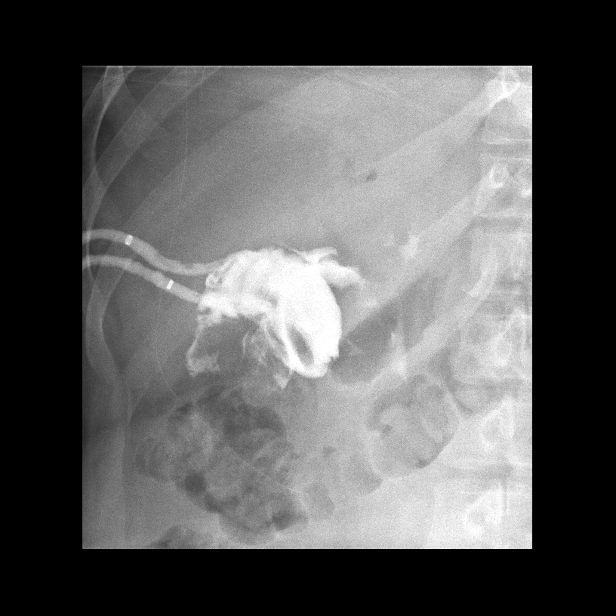
[im 3/3]
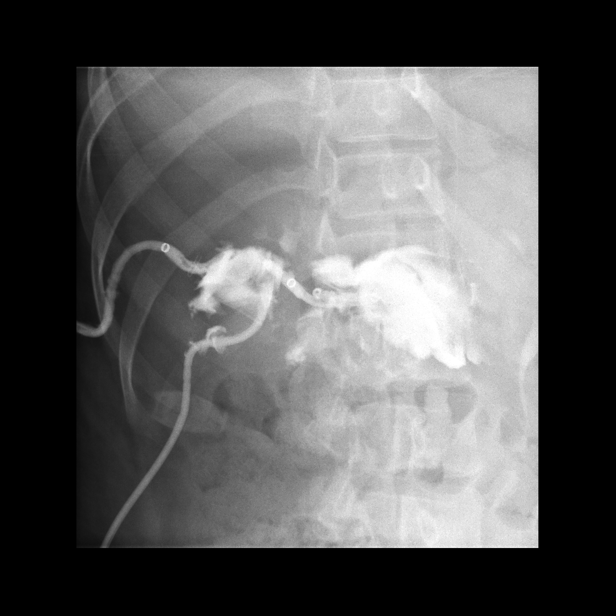

[14 of 14 positions shown; findings below may reference images not displayed]

CONTRAST:  40 mL Isovue 300-administered via the existing
percutaneous drain.

FLUOROSCOPY TIME:  54 seconds, 24.9 mGy
A preprocedural spot fluoroscopic image was obtained of the right
upper quadrant and the existing percutaneous drainage catheters.

Multiple spot fluoroscopic and radiographic images were obtained
following the injection of a small amount of contrast via the
existing percutaneous drainage catheters.

The external portion of the drains were cut to release the pigtail
in the drains removed successfully. Sterile bandages were applied.
FINDINGS: Persistent irregularly shaped right upper quadrant/subhepatic fluid
collections. There is no evidence of bilious fistula.
IMPRESSION: Persistent small, irregular, subhepatic fluid collections and well
positioned indwelling drains. Findings are most compatible with
postoperative hematomas. No evidence if bilious fistulization.

The drains were removed successfully.

## 2021-03-23 MED ORDER — IOPAMIDOL (ISOVUE-300) INJECTION 61%
100.0000 mL | Freq: Once | INTRAVENOUS | Status: AC | PRN
  Administered 2021-03-23: 100 mL via INTRAVENOUS

## 2021-03-23 NOTE — Progress Notes (Signed)
Referring Physician(s): Blackman,Douglas  Chief Complaint: The patient is seen in follow up today s/p 11/10 procedure: 1. Successful CT-guided drainage catheter placement into dominant collection/hematoma within the medial aspect of the gallbladder fossa. 2. Successful CT-guided drainage catheter placement into dominant collection/hematoma about the posteromedial aspect of the right lobe of the liver. 3. Successful CT-guided aspiration of approximately 10 cc of thick, bloody fluid from a smaller collection within the lateral aspect of the gallbladder fossa.  History of present illness:  History of laparoscopic cholecystectomy on 01/25/2021 with persistent abdominal pain, fever and chills, with indeterminate fluid collections within the gallbladder fossa and about the inferior medial aspect the right lobe of the liver worrisome for either hematomas versus contained biloma.  2 drains placed in IR 03/17/21  Pt back today for follow up 1 week post drains. 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 Per consultation with Dr. Earlene Plater, no further work-up necessary. Per MFM: " The patient was reassured that the anti-Lewis antibodies are usually of the IgM subtype and therefore we will not cross the placenta and cause fetal anemia.  The Lewis antibodies should therefore not affect the management of her current pregnancy."   Vaginal trichomoniasis 05/09/2019   04/30/2019 on Pap [] tx [] TOC- neg    Past Surgical History:  Procedure Laterality Date   BRAIN  SURGERY  [birth year]   stint placed when born   CHOLECYSTECTOMY N/A 01/25/2021   Procedure: LAPAROSCOPIC CHOLECYSTECTOMY WITH ATTEMPTED CHOLANGIOGRAM;  Surgeon: 12/1997, MD;  Location: [facility];  Service: General;  Laterality: N/A;   ERCP N/A 01/29/2021   Procedure: ENDOSCOPIC RETROGRADE CHOLANGIOPANCREATOGRAPHY (ERCP);  Surgeon: [facility], MD;  Location: [facility] ENDOSCOPY;  Service: Endoscopy;  Laterality: N/A;   REMOVAL OF STONES  01/29/2021   Procedure: REMOVAL OF STONES;  Surgeon: [facility], MD;  Location: [facility] ENDOSCOPY;  Service: Endoscopy;;   SPHINCTEROTOMY  01/29/2021   Procedure: [facility];  Surgeon: 01/31/2021, MD;  Location: [facility] ENDOSCOPY;  Service: Endoscopy;;    Allergies: Metoclopramide  Medications: Prior to Admission medications   Medication Sig Start Date End Date Taking? Authorizing Provider  acetaminophen (TYLENOL) 500 MG tablet Take 1,000 mg by mouth every 6 (six) hours as needed for moderate pain or fever.    [provider]  esomeprazole (NEXIUM) 20 MG capsule Take 20 mg by mouth daily before breakfast.    [provider]  famotidine (PEPCID) 20 MG tablet Take 1 tablet (20 mg total) by mouth 2 (two) times daily. 01/21/21   [facility], PA-C  ibuprofen (ADVIL) 800 MG tablet Take 800 mg by mouth every 8 (eight) hours as needed for headache.    [provider]  meclizine (ANTIVERT) 25 MG tablet Take 25 mg by mouth 3 (three) times daily as needed for dizziness.    [provider]  oxyCODONE-acetaminophen (PERCOCET) 5-325 MG tablet Take 1-2 tablets by mouth every 6 (six) hours as needed for severe pain or moderate pain. 03/17/21   Wallis Bamberg, PA-C  prochlorperazine (COMPAZINE) 10 MG tablet Take 10 mg by mouth every 8 (eight) hours as needed for nausea or vomiting.    [provider]  Sodium Chloride Flush (NORMAL SALINE FLUSH) 0.9 % SOLN Flush drains with 5 ml of saline daily discard remaining 03/17/21    Gershon Crane, PA-C  tiZANidine (ZANAFLEX) 4 MG tablet Take 1 tablet (4 mg total) by mouth every 8 (eight) hours as needed for muscle spasms. 12/19/20   Rhys Martini, PA-C  diphenhydrAMINE (BENADRYL) 25 MG tablet Take 12.5 mg by mouth every 6 (six) hours as needed for allergies. Patient not taking: Reported on 12/30/2019  06/21/20  [provider]     Family History  Problem Relation Age of Onset   Healthy Mother    Healthy Father     Social History   Socioeconomic History   Marital status: Single    Spouse name: Not on file   Number of children: Not on file   Years of education: Not on file   Highest education level: Associate degree: occupational, Scientist, product/process development, or vocational program  Occupational History   Occupation: Customer Service    Comment: [employer]  Tobacco Use   Smoking status: Never   Smokeless tobacco: Never  Vaping Use   Vaping Use: Never used  Substance and Sexual Activity   Alcohol use: Never   Drug use: Never   Sexual activity: Not Currently    Comment: undecided  Other Topics Concern   Not on file  Social History Narrative   Not on file   Social Determinants of Health   Financial Resource Strain: Not on file  Food Insecurity: Not on file  Transportation Needs: Not on file  Physical Activity: Not on file  Stress: Not on file  Social Connections: Not on file     Vital Signs: LMP 03/02/2021 (Exact Date)   Physical Exam Skin:    General: Skin is warm.     Comments: Sites are clean and dry NT no bleeding No sign of infection  Drain injection shows no communication to Biliary system  No fistulas are seen  CT revealing smaller collections- per Dr Elby Showers  Drains removed without complication Dressings placed      Imaging: No results found.  Labs:  CBC: Recent Labs    01/30/21 0418 01/31/21 0421 02/04/21 1153 03/17/21 0918  WBC 7.6 8.6 8.4 5.5  HGB 8.7* 9.5* 9.5* 12.4  HCT 26.7* 29.1* 29.9* 40.4  PLT 287 311 406*  244    COAGS: Recent Labs    01/26/21 1427 01/27/21 1528 03/17/21 0918  INR 1.1 1.1 1.0  APTT 23*  --   --     BMP: Recent Labs    01/29/21 0312 01/30/21 0205 01/31/21 0421 02/04/21 1153  NA 137 138 137 137  K 3.7 3.9 3.6 3.8  CL 106 106 106 108  CO2 23 22 24 23   GLUCOSE 93 77 107* 101*  BUN 5* 8 10 8   CALCIUM 8.6* 8.7* 8.6* 8.7*  CREATININE 0.83 0.85 0.83 0.68  GFRNONAA >60 >60 >60 >60    LIVER FUNCTION TESTS: Recent Labs    01/29/21 0312 01/30/21 0205 01/31/21 0421 02/04/21 1153  BILITOT 7.1* 3.3* 2.3* 1.9*  AST 165* 196* 94* 26  ALT 240* 285* 233* 62*  ALKPHOS 157* 166* 137* 109  PROT 5.5* 5.8* 5.7* 6.5  ALBUMIN 2.4* 2.5* 2.4* 2.8*    Assessment:  Post op abscess/hematoma 2 drains placed in IR 03/17/21 CT reveals today  that collections are still apparent- smaller Injections show no communication/fistula Removed drains per Dr Elby Showers   Signed: Robet Leu, PA-C 03/23/2021, 2:40 PM   Please refer to Dr. Elby Showers attestation of this note for management and plan.

## 2021-04-01 DIAGNOSIS — F4323 Adjustment disorder with mixed anxiety and depressed mood: Secondary | ICD-10-CM | POA: Diagnosis not present

## 2021-05-06 DIAGNOSIS — F4323 Adjustment disorder with mixed anxiety and depressed mood: Secondary | ICD-10-CM | POA: Diagnosis not present

## 2021-05-17 ENCOUNTER — Encounter: Payer: Self-pay | Admitting: Family

## 2021-05-17 ENCOUNTER — Ambulatory Visit
Admission: EM | Admit: 2021-05-17 | Discharge: 2021-05-17 | Disposition: A | Discharge status: Home / Self Care | Payer: Medicaid Other | Attending: Internal Medicine | Admitting: Internal Medicine

## 2021-05-17 ENCOUNTER — Encounter: Payer: Self-pay | Admitting: Physician Assistant

## 2021-05-17 ENCOUNTER — Telehealth: Payer: Medicaid Other | Admitting: Family Medicine

## 2021-05-17 ENCOUNTER — Encounter: Payer: Self-pay | Admitting: Emergency Medicine

## 2021-05-17 ENCOUNTER — Other Ambulatory Visit: Payer: Self-pay

## 2021-05-17 DIAGNOSIS — Z3202 Encounter for pregnancy test, result negative: Secondary | ICD-10-CM

## 2021-05-17 DIAGNOSIS — R519 Headache, unspecified: Secondary | ICD-10-CM

## 2021-05-17 DIAGNOSIS — J018 Other acute sinusitis: Secondary | ICD-10-CM

## 2021-05-17 DIAGNOSIS — G4452 New daily persistent headache (NDPH): Secondary | ICD-10-CM

## 2021-05-17 LAB — POCT URINE PREGNANCY: Preg Test, Ur: NEGATIVE

## 2021-05-17 MED ORDER — KETOROLAC TROMETHAMINE 30 MG/ML IJ SOLN
30.0000 mg | Freq: Once | INTRAMUSCULAR | Status: AC
  Administered 2021-05-17: 30 mg via INTRAMUSCULAR

## 2021-05-17 MED ORDER — DEXAMETHASONE SODIUM PHOSPHATE 10 MG/ML IJ SOLN
10.0000 mg | Freq: Once | INTRAMUSCULAR | Status: AC
  Administered 2021-05-17: 10 mg via INTRAMUSCULAR

## 2021-05-17 MED ORDER — AMOXICILLIN-POT CLAVULANATE 875-125 MG PO TABS: 1.0000 | ORAL_TABLET | Freq: Two times a day (BID) | ORAL | 0 refills | Status: DC

## 2021-05-17 NOTE — Discharge Instructions (Addendum)
It is suspected that you could have a sinus infection causing your headache.  You were prescribed an antibiotic to treat this.  2 medications have been given to you today in urgent care to help with your migraine.  Please go to the hospital if no improvement in migraine in the next 24 hours.

## 2021-05-17 NOTE — Progress Notes (Signed)
[place]  Needs in person eval  BP reading at home is elevated 140-150/90's C/o new daily persistent Headache with dizziness. Hx of this in recent past.   Would be good to have pregnancy ruled out.  Patient acknowledged agreement and understanding of the plan.

## 2021-05-17 NOTE — ED Provider Notes (Signed)
[name]    CSN: [id number] Arrival date & time: 05/17/21  1209      History   Chief Complaint Chief Complaint  Patient presents with   Hypertension    [name] is a 26 y.o. female.   Patient presents today for concerns of high blood pressure as she has had a persistent headache for approximately 1.5 to 2 weeks.  Pain is rated 7/10 on pain scale at this time and is located on the left side of her head.  She reports that it feels like "a drill is going into her head".  She is concerned about her blood pressure as she had a video visit today and was asked to take her blood pressure with a home cuff.  Systolic blood pressure was 140s.  She was advised to come to urgent care today for further evaluation.  She denies any history of high blood pressure and does not take any daily prescription medications.  Denies history of migraines as well.  She does report that she has had nasal congestion that started prior to headache starting.  Has not taken any medications to help alleviate nasal congestion.  Denies any fevers or known sick contacts.  Denies dizziness, blurred vision, nausea, vomiting.  Denies any chest pain or shortness of breath.   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 Per consultation with Dr. Rosana Hoes, no further work-up necessary. Per MFM: " The patient was reassured that the anti-Lewis antibodies are usually of the IgM subtype and therefore we will not cross the placenta and cause fetal anemia.  The Lewis antibodies should therefore not affect the management of her current pregnancy."   Vaginal trichomoniasis 05/09/2019   04/30/2019 on Pap [] tx [] TOC- neg    Patient Active Problem List    Diagnosis Date Noted   Hospital discharge follow-up 02/04/2021   Choledocholithiasis    Cholelithiasis 01/25/2021   History of hydrocephalus 06/11/2019   History of blood clot in brain 05/28/2019   Alpha thalassemia silent carrier 05/12/2019    Past Surgical History:  Procedure Laterality Date   BRAIN SURGERY  31-Mar-1996   stint placed when born   CHOLECYSTECTOMY N/A 01/25/2021   Procedure: LAPAROSCOPIC CHOLECYSTECTOMY WITH ATTEMPTED CHOLANGIOGRAM;  Surgeon: Coralie Keens, MD;  Location: [facility];  Service: General;  Laterality: N/A;   ERCP N/A 01/29/2021   Procedure: ENDOSCOPIC RETROGRADE CHOLANGIOPANCREATOGRAPHY (ERCP);  Surgeon: Gatha Mayer, MD;  Location: [facility] ENDOSCOPY;  Service: Endoscopy;  Laterality: N/A;   IR RADIOLOGIST EVAL & MGMT  03/23/2021   REMOVAL OF STONES  01/29/2021   Procedure: REMOVAL OF STONES;  Surgeon: Gatha Mayer, MD;  Location: [place];  Service: Endoscopy;;   SPHINCTEROTOMY  01/29/2021   Procedure: Joan Mayans;  Surgeon: Gatha Mayer, MD;  Location: [facility] ENDOSCOPY;  Service: Endoscopy;;    OB History     Gravida  1   Para  1   Term  1   Preterm      AB  0   Living  1      SAB  0   IAB      Ectopic      Multiple  0   Live Births  1            Home Medications    Prior to Admission medications   Medication Sig Start Date End Date Taking? Authorizing Provider  amoxicillin-clavulanate (AUGMENTIN) 875-125 MG tablet Take 1 tablet by mouth every 12 (twelve) hours. 05/17/21  Yes , Hildred Alamin E, FNP  acetaminophen (TYLENOL) 500 MG tablet Take 1,000 mg by mouth every 6 (six) hours as needed for moderate pain or fever.    [provider]  esomeprazole (NEXIUM) 20 MG capsule Take 20 mg by mouth daily before breakfast.    [provider]  famotidine (PEPCID) 20 MG tablet Take 1 tablet (20 mg total) by mouth 2 (two) times daily. 01/21/21   Jaynee Eagles, PA-C  ibuprofen (ADVIL) 800 MG tablet Take 800 mg by mouth every  8 (eight) hours as needed for headache.    [provider]  meclizine (ANTIVERT) 25 MG tablet Take 25 mg by mouth 3 (three) times daily as needed for dizziness.    [provider]  oxyCODONE-acetaminophen (PERCOCET) 5-325 MG tablet Take 1-2 tablets by mouth every 6 (six) hours as needed for severe pain or moderate pain. 03/17/21   Candiss Norse A, PA-C  prochlorperazine (COMPAZINE) 10 MG tablet Take 10 mg by mouth every 8 (eight) hours as needed for nausea or vomiting.    [provider]  Sodium Chloride Flush (NORMAL SALINE FLUSH) 0.9 % SOLN Flush drains with 5 ml of saline daily discard remaining 03/17/21   Ardis Rowan, PA-C  tiZANidine (ZANAFLEX) 4 MG tablet Take 1 tablet (4 mg total) by mouth every 8 (eight) hours as needed for muscle spasms. 12/19/20   Hazel Sams, PA-C  diphenhydrAMINE (BENADRYL) 25 MG tablet Take 12.5 mg by mouth every 6 (six) hours as needed for allergies. Patient not taking: Reported on 12/30/2019  06/21/20  [provider]    Family History Family History  Problem Relation Age of Onset   Healthy Mother    Healthy Father     Social History Social History   Tobacco Use   Smoking status: Never   Smokeless tobacco: Never  Vaping Use   Vaping Use: Never used  Substance Use Topics   Alcohol use: Never   Drug use: Never     Allergies   Metoclopramide   Review of Systems Review of Systems Per HPI  Physical Exam Triage Vital Signs ED Triage Vitals [05/17/21 1250]  Enc Vitals Group     BP 130/75     Pulse Rate 91     Resp 18     Temp 98 F (36.7 C)     Temp Source Oral     SpO2 98 %     Weight      Height      Head Circumference      Peak Flow      Pain Score 6     Pain Loc      Pain Edu?      Excl. in [place]?    No data found.  Updated Vital Signs BP 130/75 (BP Location: Left Arm)    Pulse 91    Temp 98 F (36.7 C) (Oral)    Resp 18    SpO2 98%   Visual Acuity Right Eye Distance:   Left  Eye Distance:   Bilateral Distance:    Right Eye Near:   Left Eye Near:    Bilateral Near:     Physical Exam Constitutional:  General: She is not in acute distress.    Appearance: Normal appearance. She is not toxic-appearing or diaphoretic.  HENT:     Head: Normocephalic and atraumatic.     Right Ear: Tympanic membrane and ear canal normal.     Left Ear: Tympanic membrane and ear canal normal.     Nose: Congestion present.     Mouth/Throat:     Mouth: Mucous membranes are moist.     Pharynx: No posterior oropharyngeal erythema.  Eyes:     Extraocular Movements: Extraocular movements intact.     Conjunctiva/sclera: Conjunctivae normal.     Pupils: Pupils are equal, round, and reactive to light.  Cardiovascular:     Rate and Rhythm: Normal rate and regular rhythm.     Pulses: Normal pulses.     Heart sounds: Normal heart sounds.  Pulmonary:     Effort: Pulmonary effort is normal. No respiratory distress.     Breath sounds: Normal breath sounds. No stridor. No wheezing, rhonchi or rales.  Abdominal:     General: Abdomen is flat. Bowel sounds are normal.     Palpations: Abdomen is soft.  Musculoskeletal:        General: Normal range of motion.     Cervical back: Normal range of motion.  Skin:    General: Skin is warm and dry.  Neurological:     General: No focal deficit present.     Mental Status: She is alert and oriented to person, place, and time. Mental status is at baseline.     Cranial Nerves: Cranial nerves 2-12 are intact.     Sensory: Sensation is intact.     Motor: Motor function is intact.     Coordination: Coordination is intact.     Gait: Gait is intact.  Psychiatric:        Mood and Affect: Mood normal.        Behavior: Behavior normal.     UC Treatments / Results  Labs (all labs ordered are listed, but only abnormal results are displayed) Labs Reviewed  POCT URINE PREGNANCY    EKG   Radiology No results found.  Procedures Procedures  (including critical care time)  Medications Ordered in UC Medications  ketorolac (TORADOL) 30 MG/ML injection 30 mg (30 mg Intramuscular Given 05/17/21 1335)  dexamethasone (DECADRON) injection 10 mg (10 mg Intramuscular Given 05/17/21 1335)    Initial Impression / Assessment and Plan / UC Course  I have reviewed the triage vital signs and the nursing notes.  Pertinent labs & imaging results that were available during my care of the patient were reviewed by me and considered in my medical decision making (see chart for details).     Patient was advised that it would be best for her to go to the hospital due to severe headache for further evaluation and management as she may need CT imaging of her head to rule out worrisome etiologies.  Patient declined going to the hospital.  Risks associated with not going to hospital were discussed with patient. Patient voiced understanding. It is likely given physical exam and patient's recent upper respiratory symptoms that patient's headache could be related to sinus infection as her blood pressure is not significantly elevated in urgent care today.  Will prescribe Augmentin.  Urine pregnancy was negative to rule out this etiology.  Decadron and Toradol administered in urgent care today for headache.  Discussed strict ER precautions and advised patient to go to the hospital if no improvement  in symptoms in the next 24 hours.  Patient verbalized understanding and was agreeable with plan. Final Clinical Impressions(s) / UC Diagnoses   Final diagnoses:  Severe headache  Acute non-recurrent sinusitis of other sinus     Discharge Instructions      It is suspected that you could have a sinus infection causing your headache.  You were prescribed an antibiotic to treat this.  2 medications have been given to you today in urgent care to help with your migraine.  Please go to the hospital if no improvement in migraine in the next 24 hours.    ED Prescriptions      Medication Sig Dispense Auth. Provider   amoxicillin-clavulanate (AUGMENTIN) 875-125 MG tablet Take 1 tablet by mouth every 12 (twelve) hours. 14 tablet [place], Michele Rockers, [place]      PDMP not reviewed this encounter.   Teodora Medici,  05/17/21 1354

## 2021-05-17 NOTE — ED Triage Notes (Signed)
Pt here after noting HA and took her BP which was elevated; pt sts unable to see PCP until end of the month

## 2021-05-18 ENCOUNTER — Encounter: Payer: Self-pay | Admitting: Family

## 2021-05-20 DIAGNOSIS — F4323 Adjustment disorder with mixed anxiety and depressed mood: Secondary | ICD-10-CM | POA: Diagnosis not present

## 2021-06-02 NOTE — Progress Notes (Signed)
[name], female    DOB: [birth year]  MRN: [id number]  CC: Annual Physical Exam  Subjective: Martha Hall is a 26 y.o. female who presents for annual physical exam.  Her concerns today include:   URGENT CARE FOLLOW-UP: [facility] note: Patient was advised that it would be best for her to go to the hospital due to severe headache for further evaluation and management as she may need CT imaging of her head to rule out worrisome etiologies.  Patient declined going to the hospital.  Risks associated with not going to hospital were discussed with patient. Patient voiced understanding. It is likely given physical exam and patient's recent upper respiratory symptoms that patient's headache could be related to sinus infection as her blood pressure is not significantly elevated in urgent care today.  Will prescribe Augmentin.  Urine pregnancy was negative to rule out this etiology.  Decadron and Toradol administered in urgent care today for headache.  Discussed strict ER precautions and advised patient to go to the hospital if no improvement in symptoms in the next 24 hours.  Patient verbalized understanding and was agreeable with plan.  06/06/2021: Reports headaches persisting. Medications from Urgent Care did not help. Sometimes feels like the worst headache of life. Considered if her vision was causing headaches. Purchased over-the-counter blue light eyeglasses which helped with vision some. Still needs an eye doctor exam. Considered if headaches coming from having a 24 year-old son with nobody to help her. Says she doesn't trust the father of her son to keep him. Her mother is available to help if needed. Son is with her 24/7 because she works from home. Thinks high-pitched crying from son may be a headache trigger. Using blackout curtains does not help. History of anxiety. Denies thoughts of self-harm, suicidal ideations, and homicidal  ideations. Public crowds make worse. Appointments with therapist every 2 weeks. Not taking any medications and not interested in any medications. She is not breastfeeding. LMP 2 weeks ago.  Description of Headaches: Location of pain: left-sided unilateral  Radiation of pain?:none Character of pain: feels like someone driving a drill into head Severity of pain: 10 Accompanying symptoms: none  Prodromal sx?: none  Rapidity of onset: gradual  Temporal Pattern of Headaches: Started having HAs 4 weeks ago Worst time of day: during the day  Awaken from sleep?: yes  Seasonal pattern?: no Overall pattern since problem began: gradually worsening  Current Use of Meds to Treat HA: Abortive meds? acetaminophen, NSAIDs (Ibuprofen)  Additional Relevant History: History of head/neck trauma? no History of head/neck surgery? Yes - reports a stent placed in head when she was a baby to help fluid drain. Stent still in place today. Was supposed to have stent removed but her mother did not remove because did not want patient to have another surgery at that time.  Substance use: illicit drugs: marijuana   Patient Active Problem List   Diagnosis Date Noted   Hospital discharge follow-up 02/04/2021   Choledocholithiasis    Cholelithiasis 01/25/2021   History of hydrocephalus 06/11/2019   History of blood clot in brain 05/28/2019   Alpha thalassemia silent carrier 05/12/2019     Current Outpatient Medications on File Prior to Visit  Medication Sig Dispense Refill   acetaminophen (TYLENOL) 500 MG tablet Take 1,000 mg by mouth every 6 (six) hours as needed for moderate pain or fever.     esomeprazole (NEXIUM) 20 MG capsule Take  20 mg by mouth daily before breakfast.     famotidine (PEPCID) 20 MG tablet Take 1 tablet (20 mg total) by mouth 2 (two) times daily. 30 tablet 0   ibuprofen (ADVIL) 800 MG tablet Take 800 mg by mouth every 8 (eight) hours as needed for headache.     meclizine (ANTIVERT) 25 MG  tablet Take 25 mg by mouth 3 (three) times daily as needed for dizziness.     prochlorperazine (COMPAZINE) 10 MG tablet Take 10 mg by mouth every 8 (eight) hours as needed for nausea or vomiting.     Sodium Chloride Flush (NORMAL SALINE FLUSH) 0.9 % SOLN Flush drains with 5 ml of saline daily discard remaining 30 mL 0   tiZANidine (ZANAFLEX) 4 MG tablet Take 1 tablet (4 mg total) by mouth every 8 (eight) hours as needed for muscle spasms. 30 tablet 0   [DISCONTINUED] diphenhydrAMINE (BENADRYL) 25 MG tablet Take 12.5 mg by mouth every 6 (six) hours as needed for allergies. (Patient not taking: Reported on 12/30/2019)     No current facility-administered medications on file prior to visit.    Allergies  Allergen Reactions   Metoclopramide Hives and Itching    Social History   Socioeconomic History   Marital status: Single    Spouse name: Not on file   Number of children: Not on file   Years of education: Not on file   Highest education level: Associate degree: occupational, Scientist, product/process developmenttechnical, or vocational program  Occupational History   Occupation: Customer Service    Comment: [employer]  Tobacco Use   Smoking status: Never   Smokeless tobacco: Never  Vaping Use   Vaping Use: Never used  Substance and Sexual Activity   Alcohol use: Never   Drug use: Never   Sexual activity: Not Currently    Comment: undecided  Other Topics Concern   Not on file  Social History Narrative   Not on file   Social Determinants of Health   Financial Resource Strain: Not on file  Food Insecurity: Not on file  Transportation Needs: Not on file  Physical Activity: Not on file  Stress: Not on file  Social Connections: Not on file  Intimate Partner Violence: Not on file    Family History  Problem Relation Age of Onset   Healthy Mother    Healthy Father     Past Surgical History:  Procedure Laterality Date   BRAIN SURGERY  11/1997   stint placed when born   CHOLECYSTECTOMY N/A 01/25/2021    Procedure: LAPAROSCOPIC CHOLECYSTECTOMY WITH ATTEMPTED CHOLANGIOGRAM;  Surgeon: Abigail MiyamotoBlackman, Douglas, MD;  [facility];  Service: General;  Laterality: N/A;   ERCP N/A 01/29/2021   Procedure: ENDOSCOPIC RETROGRADE CHOLANGIOPANCREATOGRAPHY (ERCP);  Surgeon: Iva BoopGessner, Carl E, MD;  [facility];  Service: Endoscopy;  Laterality: N/A;   IR RADIOLOGIST EVAL & MGMT  03/23/2021   REMOVAL OF STONES  01/29/2021   Procedure: REMOVAL OF STONES;  Surgeon: Iva BoopGessner, Carl E, MD;  [facility];  Service: Endoscopy;;   SPHINCTEROTOMY  01/29/2021   Procedure: Dennison MascotSPHINCTEROTOMY;  Surgeon: Iva BoopGessner, Carl E, MD;  [facility];  Service: Endoscopy;;    ROS: Review of Systems Negative except as stated above  PHYSICAL EXAM: BP 118/87 (BP Location: Left Arm, Patient Position: Sitting, Cuff Size: Large)    Pulse 100    Temp 98.7 F (37.1 C)    Resp 18    Ht 5' 0.98" (1.549 m)    Wt 229 lb (103.9  kg)    SpO2 98%    BMI 43.29 kg/m   Physical Exam HENT:     Head: Normocephalic and atraumatic.     Right Ear: Tympanic membrane, ear canal and external ear normal.     Left Ear: Tympanic membrane, ear canal and external ear normal.  Eyes:     Extraocular Movements: Extraocular movements intact.     Conjunctiva/sclera: Conjunctivae normal.     Pupils: Pupils are equal, round, and reactive to light.  Cardiovascular:     Rate and Rhythm: Normal rate and regular rhythm.     Pulses: Normal pulses.     Heart sounds: Normal heart sounds.  Pulmonary:     Effort: Pulmonary effort is normal.     Breath sounds: Normal breath sounds.  Chest:     Comments: Patient declined exam.  Abdominal:     General: Bowel sounds are normal. There is no distension.     Palpations: Abdomen is soft.  Genitourinary:    Comments: Patient declined exam. Musculoskeletal:        General: Normal range of motion.     Cervical back: Normal range of motion and neck supple.  Skin:    General: Skin is warm and dry.      Capillary Refill: Capillary refill takes less than 2 seconds.  Neurological:     General: No focal deficit present.     Mental Status: She is alert and oriented to person, place, and time.  Psychiatric:        Mood and Affect: Mood normal.        Behavior: Behavior normal.   ASSESSMENT AND PLAN: 1. Annual physical exam: - Counseled on 150 minutes of exercise per week as tolerated, healthy eating (including decreased daily intake of saturated fats, cholesterol, added sugars, sodium), STI prevention, and routine healthcare maintenance.  2. Screening for metabolic disorder: - Screening liver function.  - ALT  3. Diabetes mellitus screening: - Hemoglobin A1c to screen for pre-diabetes/diabetes. - Microalbumin creatinine screening for kidney function.  - Microalbumin / creatinine urine ratio - Hemoglobin A1c  4. Screening cholesterol level: - Lipid panel to screen for high cholesterol.  - Lipid panel  5. Thyroid disorder screen: - TSH to check thyroid function.  - TSH  6. Screening examination for STD (sexually transmitted disease): - Cervicovaginal self-swab to screen for chlamydia, gonorrhea, trichomonas, bacterial vaginitis, and candida vaginitis. - Cervicovaginal ancillary only  7. Eye exam, routine: - Referral to Ophthalmology for further evaluation and management.  - Ambulatory referral to Ophthalmology  8. Need for HPV vaccination: - Administered today in office.  - HPV 9-valent vaccine,Recombinat  9. Migraine without aura and without status migrainosus, not intractable: - Begin Sumatriptan as prescribed. Medication compliance and adverse effects discussed.  - CT head for further evaluation.  - Referral to Neurology for further evaluation and management.  - Patient was given clear instructions to go to Emergency Department or return to medical center if symptoms don't improve, worsen, or new problems develop.The patient verbalized understanding. - Follow-up with  primary provider as scheduled. - SUMAtriptan (IMITREX) 25 MG tablet; Take 25 mg (1 tablet total) by mouth at the start of the headache. May repeat in 2 hours x 1 if headache persists. Max of 2 tablets/24 hours.  Dispense: 30 tablet; Refill: 0 - Ambulatory referral to Neurology - CT HEAD WO CONTRAST ( ); Future  10. GAD (generalized anxiety disorder): - Patient denies thoughts of self-harm, suicidal ideations, homicidal ideations. - Patient  declined pharmacological therapy.  - Keep all scheduled appointments with therapist.  - Follow-up with primary provider as scheduled.    Patient was given the opportunity to ask questions.  Patient verbalized understanding of the plan and was able to repeat key elements of the plan. Patient was given clear instructions to go to Emergency Department or return to medical center if symptoms don't improve, worsen, or new problems develop.The patient verbalized understanding.   Orders Placed This Encounter  Procedures   CT HEAD WO CONTRAST ( )   HPV 9-valent vaccine,Recombinat   Microalbumin / creatinine urine ratio   ALT   Lipid panel   TSH   Hemoglobin A1c   Ambulatory referral to Ophthalmology   Ambulatory referral to Neurology     Requested Prescriptions   Signed Prescriptions Disp Refills   SUMAtriptan (IMITREX) 25 MG tablet 30 tablet 0    Sig: Take 25 mg (1 tablet total) by mouth at the start of the headache. May repeat in 2 hours x 1 if headache persists. Max of 2 tablets/24 hours.    Return in about 1 year (around 06/06/2022) for Physical per patient preference.  Rema Fendt, NP

## 2021-06-06 ENCOUNTER — Encounter: Payer: Self-pay | Admitting: Family

## 2021-06-06 ENCOUNTER — Ambulatory Visit (INDEPENDENT_AMBULATORY_CARE_PROVIDER_SITE_OTHER): Payer: Medicaid Other | Admitting: Family

## 2021-06-06 ENCOUNTER — Other Ambulatory Visit (HOSPITAL_COMMUNITY)
Admission: RE | Admit: 2021-06-06 | Discharge: 2021-06-06 | Disposition: A | Discharge status: Home / Self Care | Payer: Medicaid Other | Source: Ambulatory Visit | Attending: Family | Admitting: Family

## 2021-06-06 ENCOUNTER — Other Ambulatory Visit: Payer: Self-pay

## 2021-06-06 VITALS — BP 118/87 | HR 100 | Temp 98.7°F | Resp 18 | Ht 60.98 in | Wt 229.0 lb

## 2021-06-06 DIAGNOSIS — Z131 Encounter for screening for diabetes mellitus: Secondary | ICD-10-CM

## 2021-06-06 DIAGNOSIS — Z113 Encounter for screening for infections with a predominantly sexual mode of transmission: Secondary | ICD-10-CM | POA: Diagnosis not present

## 2021-06-06 DIAGNOSIS — G43009 Migraine without aura, not intractable, without status migrainosus: Secondary | ICD-10-CM

## 2021-06-06 DIAGNOSIS — Z1329 Encounter for screening for other suspected endocrine disorder: Secondary | ICD-10-CM

## 2021-06-06 DIAGNOSIS — F411 Generalized anxiety disorder: Secondary | ICD-10-CM | POA: Diagnosis not present

## 2021-06-06 DIAGNOSIS — Z0100 Encounter for examination of eyes and vision without abnormal findings: Secondary | ICD-10-CM

## 2021-06-06 DIAGNOSIS — Z23 Encounter for immunization: Secondary | ICD-10-CM | POA: Diagnosis not present

## 2021-06-06 DIAGNOSIS — Z0001 Encounter for general adult medical examination with abnormal findings: Secondary | ICD-10-CM | POA: Diagnosis not present

## 2021-06-06 DIAGNOSIS — Z13220 Encounter for screening for lipoid disorders: Secondary | ICD-10-CM | POA: Diagnosis not present

## 2021-06-06 DIAGNOSIS — Z13228 Encounter for screening for other metabolic disorders: Secondary | ICD-10-CM | POA: Diagnosis not present

## 2021-06-06 DIAGNOSIS — Z0000 Encounter for general adult medical examination without abnormal findings: Secondary | ICD-10-CM

## 2021-06-06 MED ORDER — SUMATRIPTAN SUCCINATE 25 MG PO TABS: ORAL_TABLET | ORAL | 0 refills | Status: DC

## 2021-06-06 NOTE — Progress Notes (Signed)
Pt presents for annual exam desires STD testing, pt complains of headache been present for about month, symptoms include nausea and vomiting, sensitivity to light   HPV vaccine administered

## 2021-06-06 NOTE — Patient Instructions (Signed)
Preventive Care 21-24 Years Old, Female °Preventive care refers to lifestyle choices and visits with your health care provider that can promote health and wellness. Preventive care visits are also called wellness exams. °What can I expect for my preventive care visit? °Counseling °During your preventive care visit, your health care provider may ask about your: °Medical history, including: °Past medical problems. °Family medical history. °Pregnancy history. °Current health, including: °Menstrual cycle. °Method of birth control. °Emotional well-being. °Home life and relationship well-being. °Sexual activity and sexual health. °Lifestyle, including: °Alcohol, nicotine or tobacco, and drug use. °Access to firearms. °Diet, exercise, and sleep habits. °Work and work environment. °Sunscreen use. °Safety issues such as seatbelt and bike helmet use. °Physical exam °Your health care provider may check your: °Height and weight. These may be used to calculate your BMI (body mass index). BMI is a measurement that tells if you are at a healthy weight. °Waist circumference. This measures the distance around your waistline. This measurement also tells if you are at a healthy weight and may help predict your risk of certain diseases, such as type 2 diabetes and high blood pressure. °Heart rate and blood pressure. °Body temperature. °Skin for abnormal spots. °What immunizations do I need? °Vaccines are usually given at various ages, according to a schedule. Your health care provider will recommend vaccines for you based on your age, medical history, and lifestyle or other factors, such as travel or where you work. °What tests do I need? °Screening °Your health care provider may recommend screening tests for certain conditions. This may include: °Pelvic exam and Pap test. °Lipid and cholesterol levels. °Diabetes screening. This is done by checking your blood sugar (glucose) after you have not eaten for a while (fasting). °Hepatitis B  test. °Hepatitis C test. °HIV (human immunodeficiency virus) test. °STI (sexually transmitted infection) testing, if you are at risk. °BRCA-related cancer screening. This may be done if you have a family history of breast, ovarian, tubal, or peritoneal cancers. °Talk with your health care provider about your test results, treatment options, and if necessary, the need for more tests. °Follow these instructions at home: °Eating and drinking ° °Eat a healthy diet that includes fresh fruits and vegetables, whole grains, lean protein, and low-fat dairy products. °Take vitamin and mineral supplements as recommended by your health care provider. °Do not drink alcohol if: °Your health care provider tells you not to drink. °You are pregnant, may be pregnant, or are planning to become pregnant. °If you drink alcohol: °Limit how much you have to 0-1 drink a day. °Know how much alcohol is in your drink. In the [place] one drink equals one 12 oz bottle of beer (355 mL), one 5 oz glass of wine (148 mL), or one 1½ oz glass of hard liquor (44 mL). °Lifestyle °Brush your teeth every morning and night with fluoride toothpaste. Floss one time each day. °Exercise for at least 30 minutes 5 or more days each week. °Do not use any products that contain nicotine or tobacco. These products include cigarettes, chewing tobacco, and vaping devices, such as e-cigarettes. If you need help quitting, ask your health care provider. °Do not use drugs. °If you are sexually active, practice safe sex. Use a condom or other form of protection to prevent STIs. °If you do not wish to become pregnant, use a form of birth control. If you plan to become pregnant, see your health care provider for a prepregnancy visit. °Find healthy ways to manage stress, such as: °Meditation, yoga,   or listening to music. °Journaling. °Talking to a trusted person. °Spending time with friends and family. °Minimize exposure to UV radiation to reduce your risk of skin  cancer. °Safety °Always wear your seat belt while driving or riding in a vehicle. °Do not drive: °If you have been drinking alcohol. Do not ride with someone who has been drinking. °If you have been using any mind-altering substances or drugs. °While texting. °When you are tired or distracted. °Wear a helmet and other protective equipment during sports activities. °If you have firearms in your house, make sure you follow all gun safety procedures. °Seek help if you have been physically or sexually abused. °What's next? °Go to your health care provider once a year for an annual wellness visit. °Ask your health care provider how often you should have your eyes and teeth checked. °Stay up to date on all vaccines. °This information is not intended to replace advice given to you by your health care provider. Make sure you discuss any questions you have with your health care provider. °Document Revised: 10/20/2020 Document Reviewed: 10/20/2020 °Elsevier Patient Education © 2022 [employer] Inc[employer] ° °

## 2021-06-07 ENCOUNTER — Other Ambulatory Visit: Payer: Self-pay | Admitting: Family

## 2021-06-07 ENCOUNTER — Encounter: Payer: Self-pay | Admitting: Family

## 2021-06-07 DIAGNOSIS — A5901 Trichomonal vulvovaginitis: Secondary | ICD-10-CM | POA: Insufficient documentation

## 2021-06-07 DIAGNOSIS — R7303 Prediabetes: Secondary | ICD-10-CM | POA: Insufficient documentation

## 2021-06-07 LAB — CERVICOVAGINAL ANCILLARY ONLY
Bacterial Vaginitis (gardnerella): NEGATIVE
Candida Glabrata: NEGATIVE
Candida Vaginitis: NEGATIVE
Chlamydia: NEGATIVE
Comment: NEGATIVE
Comment: NEGATIVE
Comment: NEGATIVE
Comment: NEGATIVE
Comment: NEGATIVE
Comment: NORMAL
Neisseria Gonorrhea: NEGATIVE
Trichomonas: POSITIVE — AB

## 2021-06-07 LAB — LIPID PANEL
Chol/HDL Ratio: 2.5 ratio (ref 0.0–4.4)
Cholesterol, Total: 132 mg/dL (ref 100–199)
HDL: 53 mg/dL (ref >39)
LDL Chol Calc (NIH): 61 mg/dL (ref 0–99)
Triglycerides: 97 mg/dL (ref 0–149)
VLDL Cholesterol Cal: 18 mg/dL (ref 5–40)

## 2021-06-07 LAB — TSH: TSH: 1.35 u[IU]/mL (ref 0.450–4.500)

## 2021-06-07 LAB — ALT: ALT: 18 IU/L (ref 0–32)

## 2021-06-07 LAB — MICROALBUMIN / CREATININE URINE RATIO
Creatinine, Urine: 281.7 mg/dL
Microalb/Creat Ratio: 22 mg/g creat (ref 0–29)
Microalbumin, Urine: 62.6 ug/mL

## 2021-06-07 LAB — HEMOGLOBIN A1C
Est. average glucose Bld gHb Est-mCnc: 123 mg/dL
Hgb A1c MFr Bld: 5.9 % — ABNORMAL HIGH (ref 4.8–5.6)

## 2021-06-07 MED ORDER — METRONIDAZOLE 500 MG PO TABS: 500.0000 mg | ORAL_TABLET | Freq: Two times a day (BID) | ORAL | 0 refills | Status: AC

## 2021-06-07 NOTE — Progress Notes (Signed)
Liver function normal.   Thyroid function normal.   Cholesterol normal.   Hemoglobin A1c is consistent with pre-diabetes. Practice healthy eating habits of fresh fruit and vegetables, lean baked meats such as chicken, fish, and [place]; limit breads, rice, pastas, and desserts; practice regular aerobic exercise (at least 150 minutes a week as tolerated). No medication needed at the moment. Encouraged to recheck pre-diabetes in 6 months.

## 2021-06-07 NOTE — Progress Notes (Signed)
Microalbumin creatinine (which checks for kidney function) normal.

## 2021-06-07 NOTE — Progress Notes (Signed)
Gonorrhea, Chlamydia, Bacterial Vaginitis, and Candida Vaginitis (sometimes called a yeast infection) negative.  Positive for Trichomonas which is a sexually transmitted infection. Both patient and her partner need to be treated for Trichomonas. Also, both need to complete treatment prior to having unprotected sex again. Do not cosume alcohol while taking prescribed antibiotic as this may cause severe upset stomach, nausea, and vomiting.

## 2021-06-26 DIAGNOSIS — H5213 Myopia, bilateral: Secondary | ICD-10-CM | POA: Diagnosis not present

## 2021-06-28 ENCOUNTER — Encounter: Payer: Self-pay | Admitting: Family

## 2021-09-29 ENCOUNTER — Ambulatory Visit (INDEPENDENT_AMBULATORY_CARE_PROVIDER_SITE_OTHER): Payer: Medicaid Other

## 2021-09-29 ENCOUNTER — Emergency Department (HOSPITAL_COMMUNITY): Payer: Medicaid Other

## 2021-09-29 ENCOUNTER — Emergency Department (HOSPITAL_COMMUNITY)
Admission: EM | Admit: 2021-09-29 | Discharge: 2021-09-29 | Disposition: A | Discharge status: Home / Self Care | Payer: Medicaid Other | Attending: Emergency Medicine | Admitting: Emergency Medicine

## 2021-09-29 ENCOUNTER — Encounter (HOSPITAL_COMMUNITY): Payer: Self-pay | Admitting: *Deleted

## 2021-09-29 ENCOUNTER — Other Ambulatory Visit: Payer: Self-pay

## 2021-09-29 ENCOUNTER — Ambulatory Visit (HOSPITAL_COMMUNITY)
Admission: EM | Admit: 2021-09-29 | Discharge: 2021-09-29 | Disposition: A | Discharge status: Home / Self Care | Payer: Medicaid Other | Attending: Family Medicine | Admitting: Family Medicine

## 2021-09-29 ENCOUNTER — Encounter (HOSPITAL_COMMUNITY): Payer: Self-pay

## 2021-09-29 DIAGNOSIS — R0602 Shortness of breath: Secondary | ICD-10-CM | POA: Diagnosis not present

## 2021-09-29 DIAGNOSIS — R0789 Other chest pain: Secondary | ICD-10-CM | POA: Diagnosis not present

## 2021-09-29 DIAGNOSIS — R079 Chest pain, unspecified: Secondary | ICD-10-CM | POA: Insufficient documentation

## 2021-09-29 DIAGNOSIS — F419 Anxiety disorder, unspecified: Secondary | ICD-10-CM | POA: Insufficient documentation

## 2021-09-29 DIAGNOSIS — R200 Anesthesia of skin: Secondary | ICD-10-CM | POA: Diagnosis not present

## 2021-09-29 DIAGNOSIS — M79602 Pain in left arm: Secondary | ICD-10-CM | POA: Diagnosis present

## 2021-09-29 LAB — TROPONIN I (HIGH SENSITIVITY)
Troponin I (High Sensitivity): <2 ng/L (ref <18)
Troponin I (High Sensitivity): 4 ng/L (ref <18)

## 2021-09-29 LAB — CBC
HCT: 39.7 % (ref 36.0–46.0)
Hemoglobin: 13 g/dL (ref 12.0–15.0)
MCH: 28.8 pg (ref 26.0–34.0)
MCHC: 32.7 g/dL (ref 30.0–36.0)
MCV: 87.8 fL (ref 80.0–100.0)
Platelets: 300 10*3/uL (ref 150–400)
RBC: 4.52 MIL/uL (ref 3.87–5.11)
RDW: 12.5 % (ref 11.5–15.5)
WBC: 5.8 10*3/uL (ref 4.0–10.5)
nRBC: 0 % (ref 0.0–0.2)

## 2021-09-29 LAB — BASIC METABOLIC PANEL
Anion gap: 5 (ref 5–15)
BUN: 9 mg/dL (ref 6–20)
CO2: 24 mmol/L (ref 22–32)
Calcium: 9.1 mg/dL (ref 8.9–10.3)
Chloride: 112 mmol/L — ABNORMAL HIGH (ref 98–111)
Creatinine, Ser: 0.78 mg/dL (ref 0.44–1.00)
GFR, Estimated: >60 mL/min (ref >60)
Glucose, Bld: 92 mg/dL (ref 70–99)
Potassium: 4.2 mmol/L (ref 3.5–5.1)
Sodium: 141 mmol/L (ref 135–145)

## 2021-09-29 IMAGING — DX DG CHEST 2V
2 series · 2 of 2 positions shown · non-contrast
Comparison: [DATE]

CLINICAL DATA: Chest pain

EXAM:
CHEST - 2 VIEW

[chest pa]
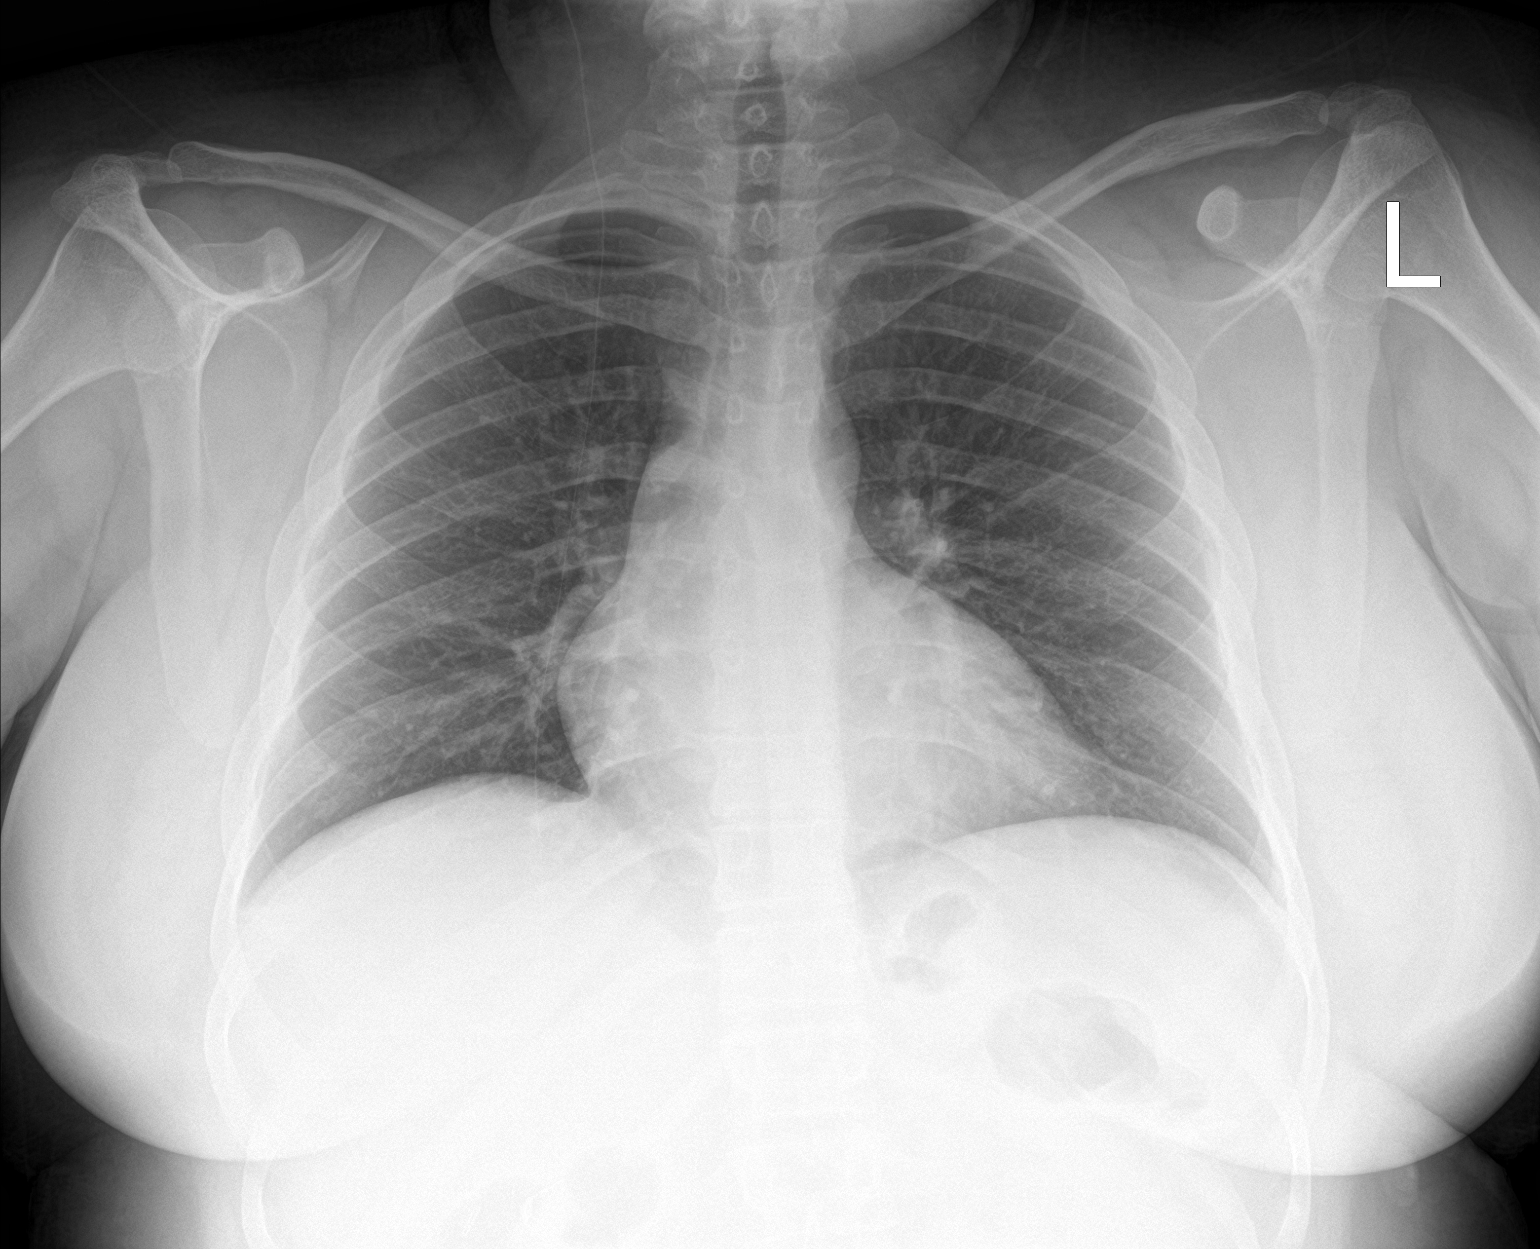

[chest lat]
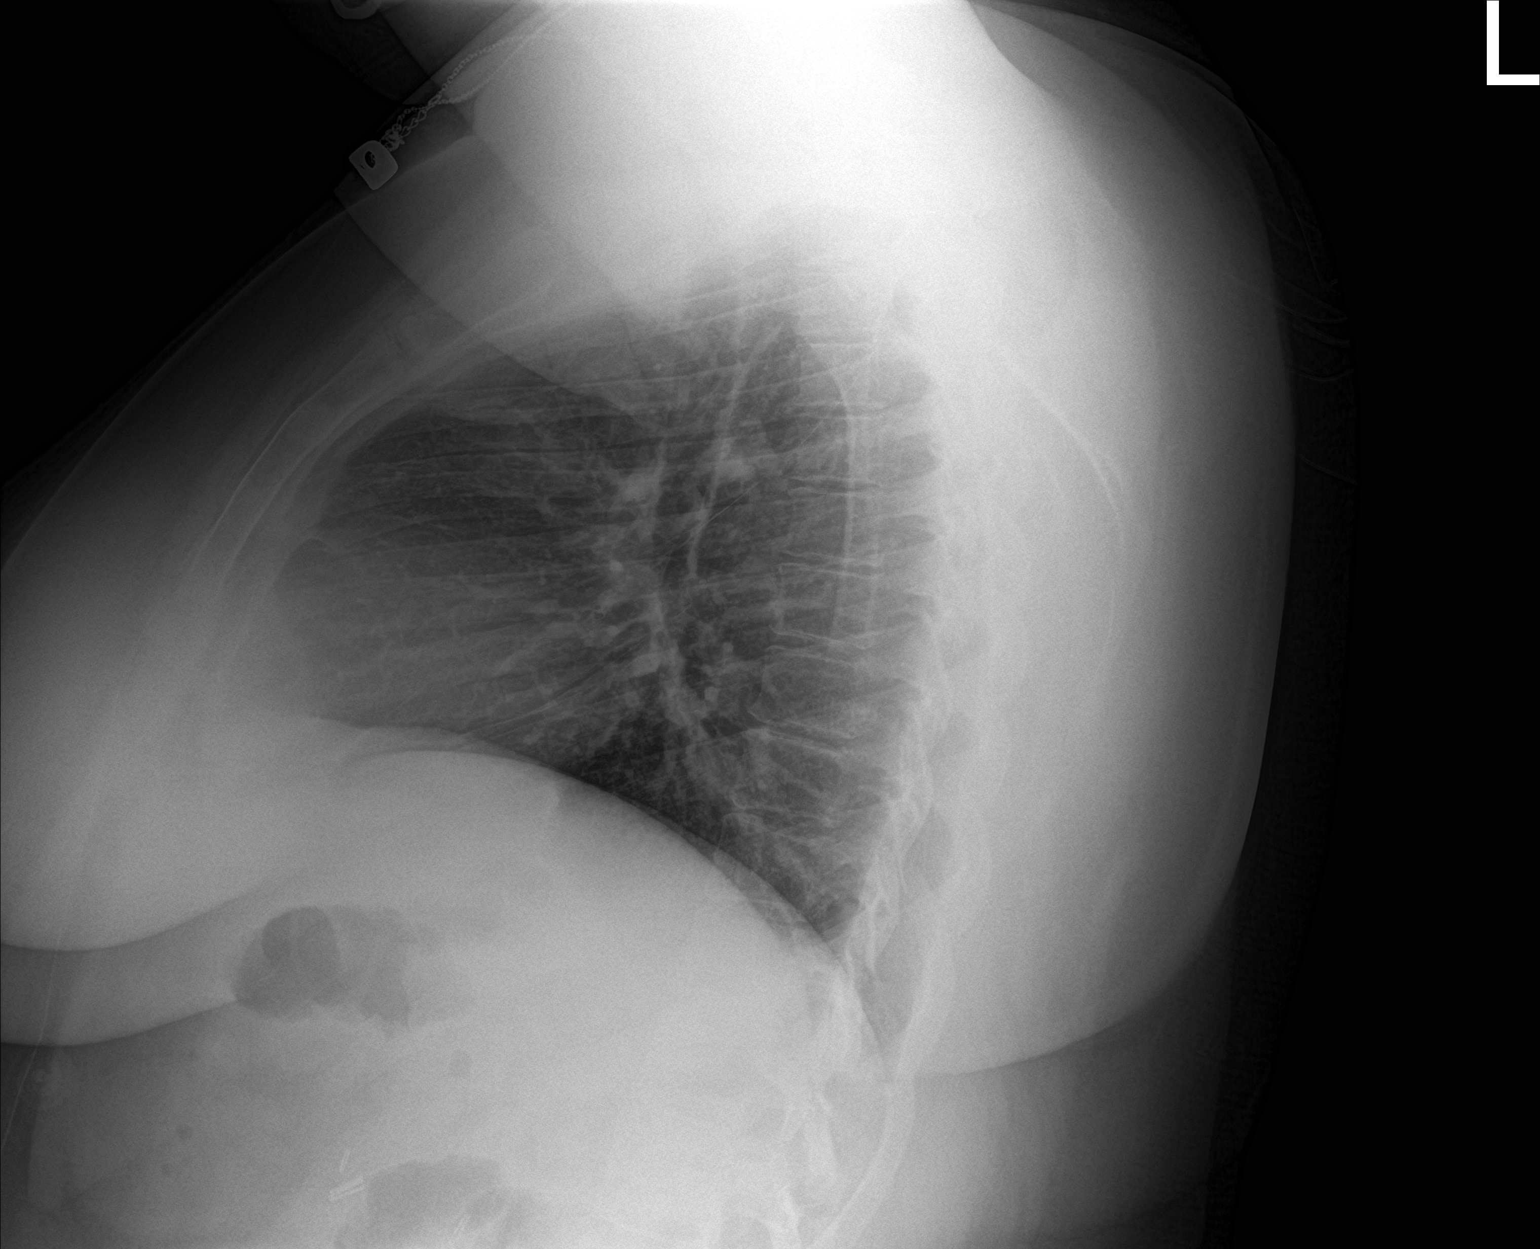

[2 of 2 positions shown; findings below may reference images not displayed]

FINDINGS: The heart size and mediastinal contours are within normal limits.
Both lungs are clear. The visualized skeletal structures are
unremarkable.
IMPRESSION: No active cardiopulmonary disease.

## 2021-09-29 IMAGING — DX DG CHEST 2V
2 series · 2 of 2 positions shown · non-contrast
Comparison: None

CLINICAL DATA: Shortness of breath, chest tightness and numbness in
LEFT arm for 3 weeks

EXAM:
CHEST - 2 VIEW

[chest pa]
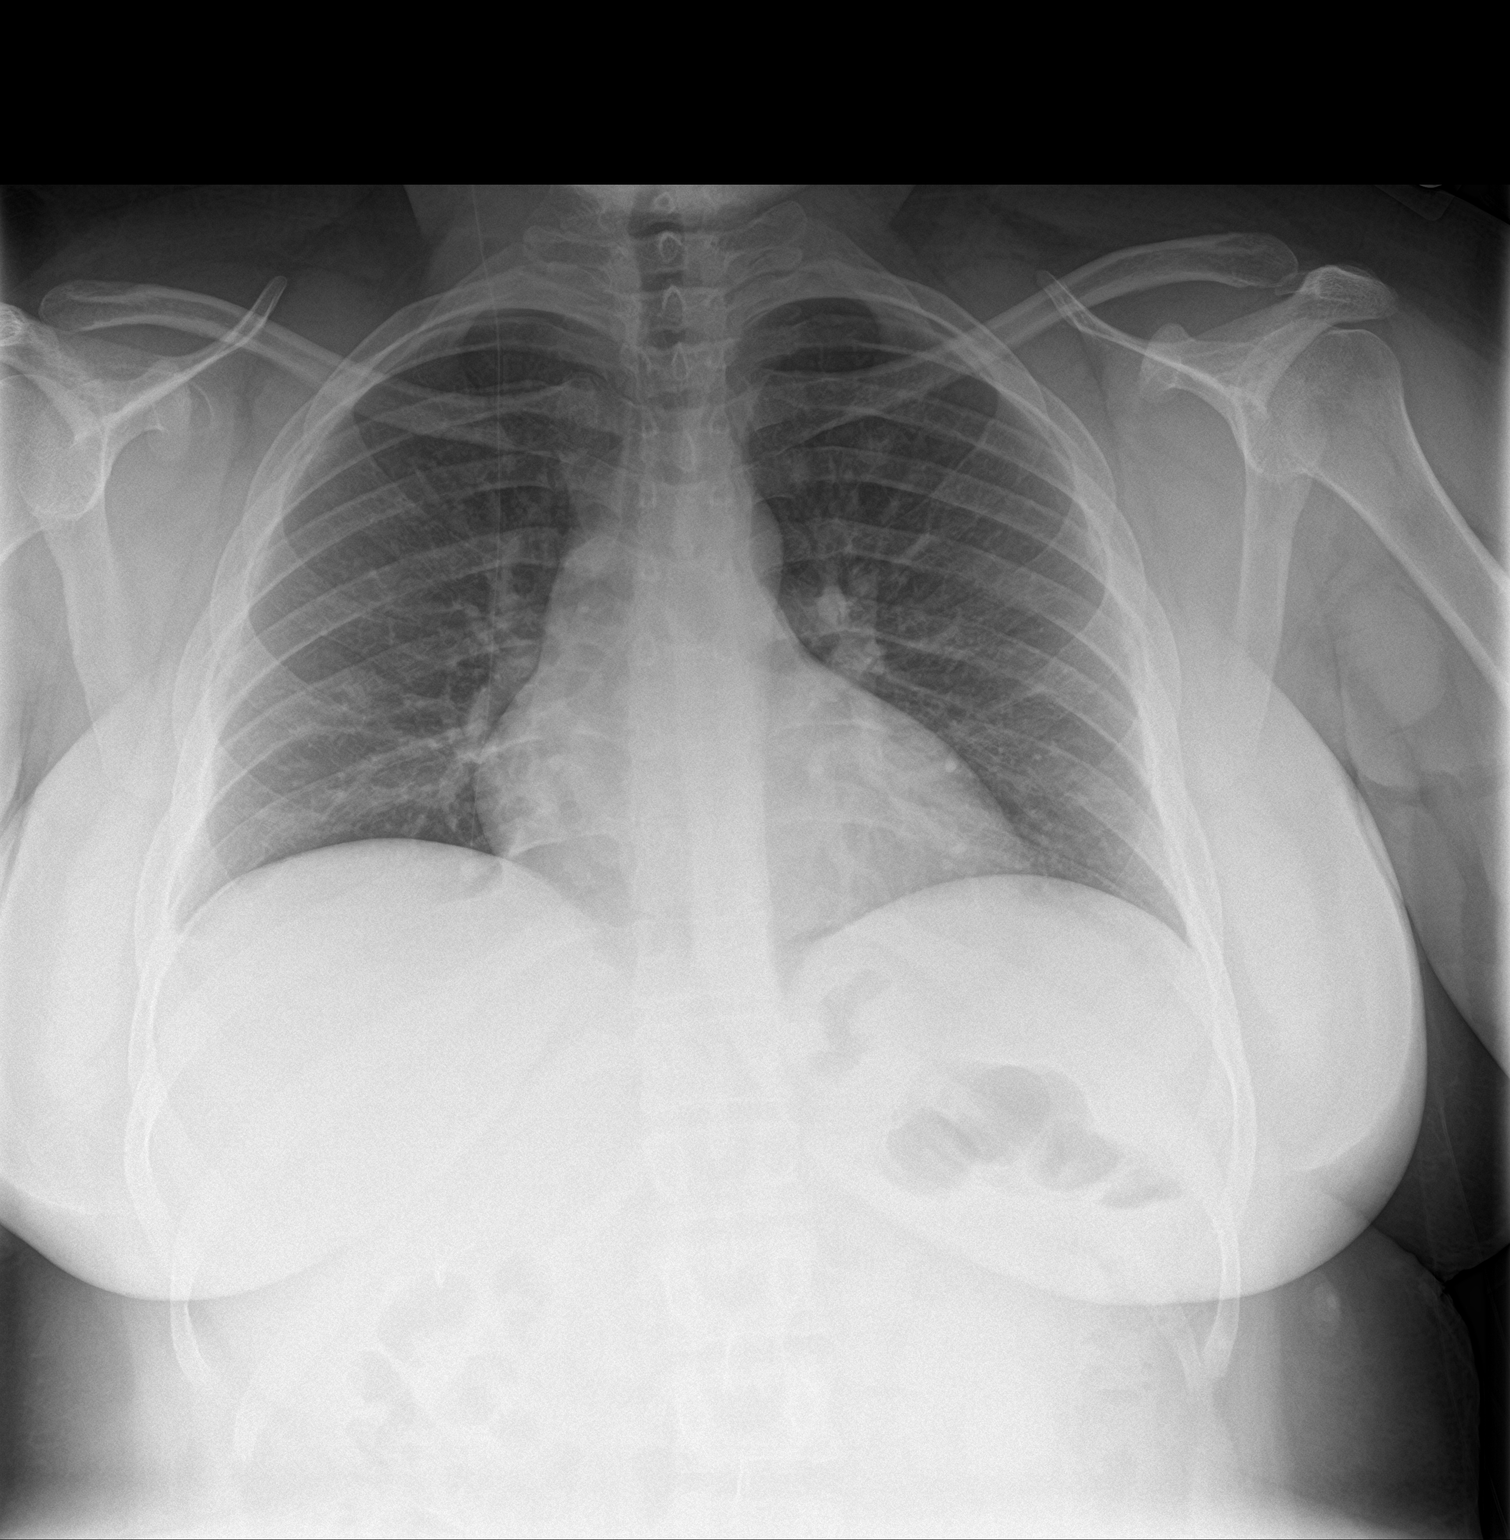

[chest lat]
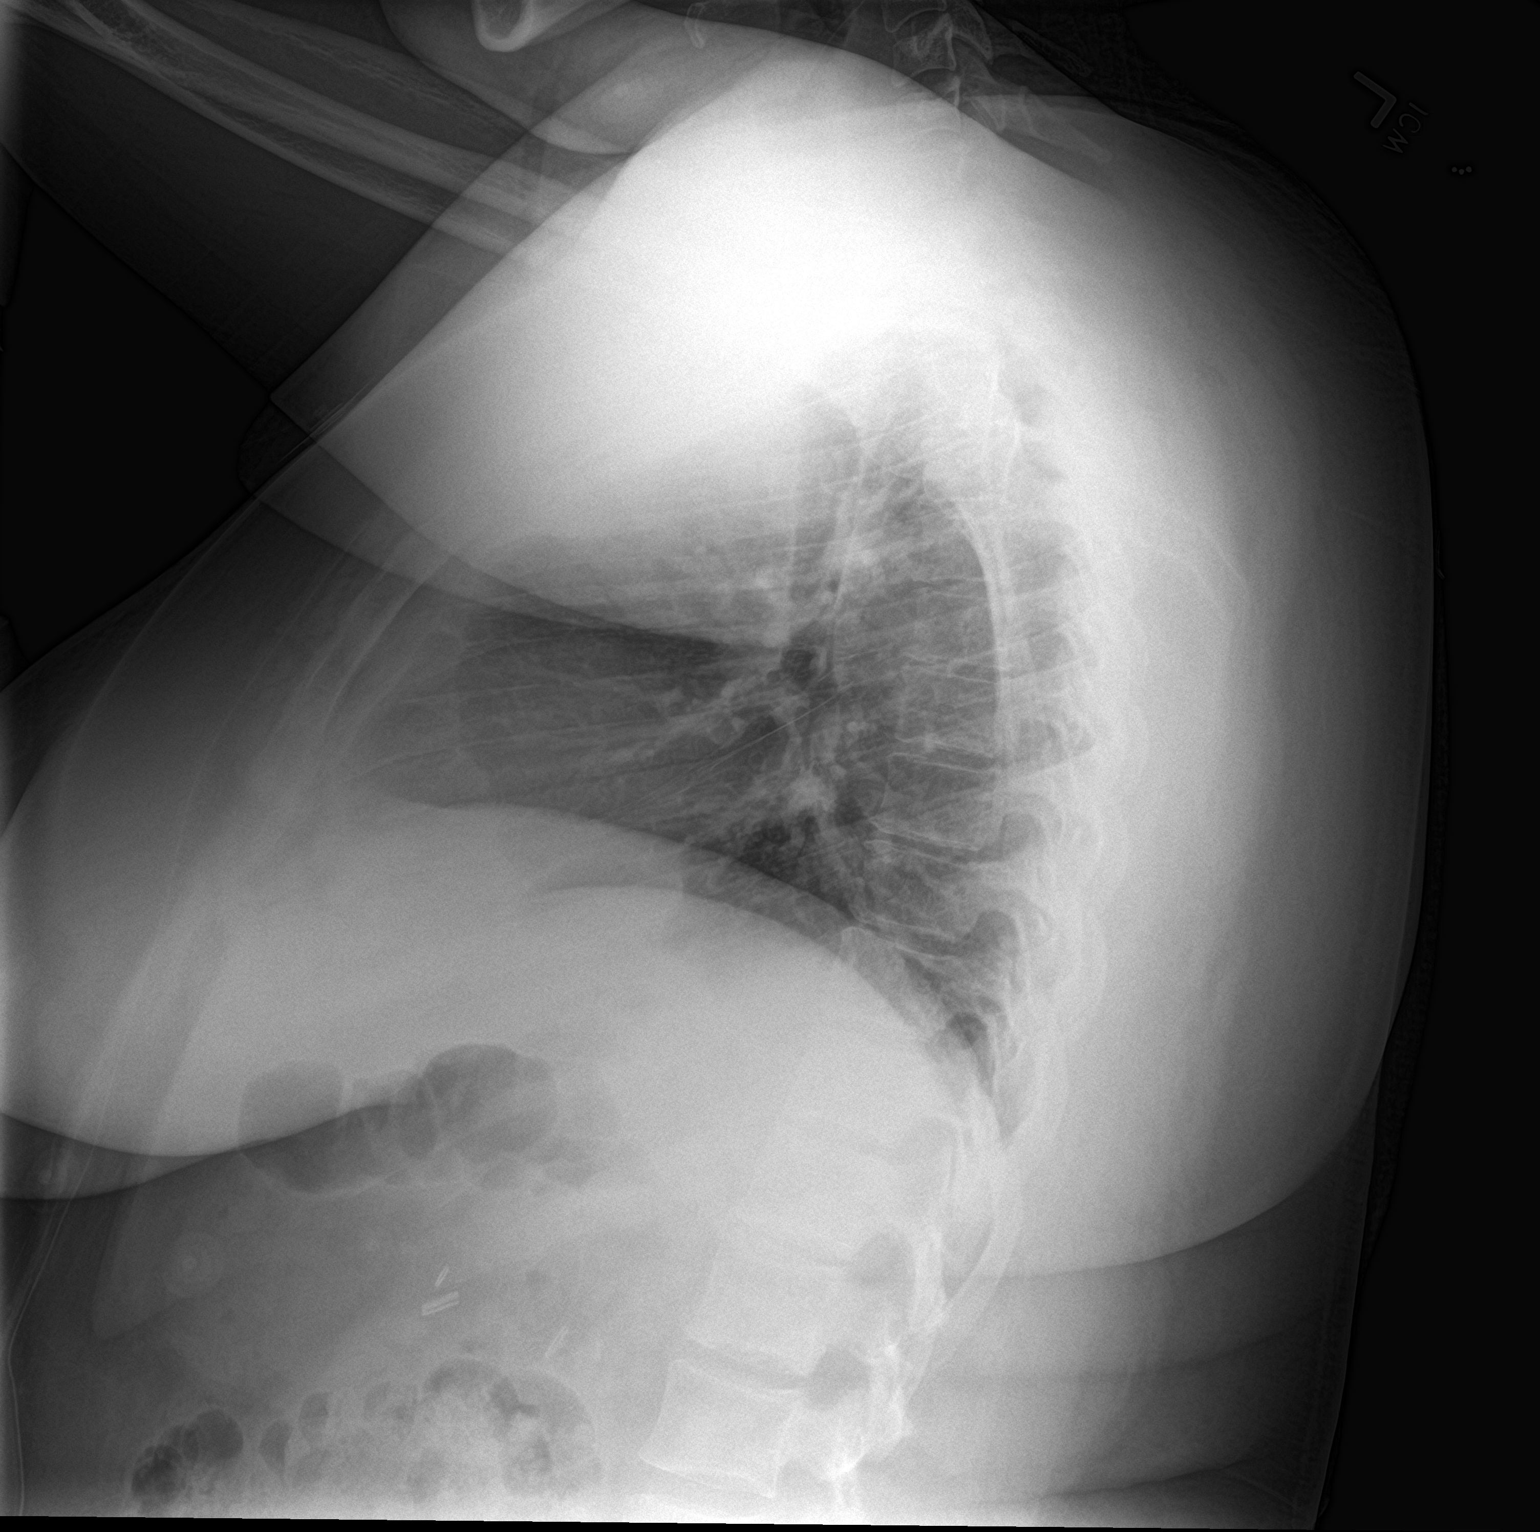

[2 of 2 positions shown; findings below may reference images not displayed]

FINDINGS: Normal heart size, mediastinal contours, and pulmonary vascularity.

Lungs clear.

No pleural effusion or pneumothorax.

Bones unremarkable.
IMPRESSION: Normal exam.

## 2021-09-29 MED ORDER — ESOMEPRAZOLE MAGNESIUM 20 MG PO CPDR: 20.0000 mg | DELAYED_RELEASE_CAPSULE | Freq: Every day | ORAL | 0 refills | Status: DC

## 2021-09-29 MED ORDER — HYDROXYZINE HCL 25 MG PO TABS: 25.0000 mg | ORAL_TABLET | Freq: Three times a day (TID) | ORAL | 0 refills | Status: DC | PRN

## 2021-09-29 NOTE — ED Provider Triage Note (Signed)
Emergency Medicine Provider Triage Evaluation Note  Martha Hall , a 26 y.o. female  was evaluated in triage.  Pt complains of substernal chest cramping that radiates into the left chest.  Is been ongoing for 3 weeks.  He states breathing makes this worse.  She denies any estrogen use, birth control, long trips or flights.  No leg pain.  Review of Systems  Positive:  Negative: See above   Physical Exam  BP (!) 152/97 (BP Location: Right Arm)   Pulse 90   Temp 98 F (36.7 C) (Oral)   Resp 17   Ht 5' (1.524 m)   Wt 99.8 kg   LMP 09/22/2021   SpO2 100%   BMI 42.97 kg/m  Gen:   Awake, no distress   Resp:  Normal effort  MSK:   Moves extremities without difficulty  Other:    Medical Decision Making  Medically screening exam initiated at 12:46 PM.  Appropriate orders placed.  Martha Hall was informed that the remainder of the evaluation will be completed by another provider, this initial triage assessment does not replace that evaluation, and the importance of remaining in the ED until their evaluation is complete.    Myna Bright [place], [place] 09/29/21 1250

## 2021-09-29 NOTE — ED Triage Notes (Signed)
Pt reports pain at upper lateral shoulder junction for 2 weeks. Pt also reports panic attacks because of pain . Pt reports panick attacks have made pain move to center of chest.

## 2021-09-29 NOTE — ED Provider Notes (Addendum)
[facility] CARE CENTER   [id number] 09/29/21 Arrival Time: 1021  ASSESSMENT & PLAN:  1. Chest pain, unspecified type    Reports that she is planning on going to ED for further evaluation. Benign exam here. I am not suspicious of any acute/life-threatening problem that is causing her pain. Non-smoker. Denies recreational drug use. No OCP use. VSS. Does have h/o GERD and is not on treatment; possible relation to current symptoms. Denies trial of PPI with close follow up. No h/o asthma. No wheezing here. CXR normal. ECG with NSR; no STEMI or arrhythmia. Is very anxious and I think this is playing a large part in her current symptoms. Attempted to discuss but she is not very receptive to this idea. Denies trial of Vistaril.   Reviewed expectations re: course of current medical issues. Questions answered. Outlined signs and symptoms indicating need for more acute intervention. Patient verbalized understanding. After Visit Summary given.   SUBJECTIVE: History from: patient. Martha Hall is a 26 y.o. female who presents with complaint of upper anterior chest pain; described as sharp; intermittent but thinks worse at night; for past 2 weeks. No change. Transient lasting seconds to minutes with associated SOB; no identifiable pattern to episodes. Symptoms do make her very anxious. Admits feeling anxious over non-present symptoms now. Non-smoker. Denies recreational drug use. No OCP use. No LE edema/swelling. No associated back pain. 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 Per consultation with Dr. Earlene Plater, no further work-up necessary. Per MFM: " The patient was reassured that the anti-Lewis antibodies are usually of the IgM subtype and  therefore we will not cross the placenta and cause fetal anemia.  The Lewis antibodies should therefore not affect the management of her current pregnancy."   Vaginal trichomoniasis 05/09/2019   04/30/2019 on Pap [] tx [] TOC- neg   Denies: fatigue, irregular heart beat, near-syncope, orthopnea, palpitations, paroxysmal nocturnal dyspnea, and syncope. No recent illnesses. Ambulatory without assistance.  OBJECTIVE:  Vitals:   09/29/21 1033  BP: 119/74  Pulse: 79  Resp: 18  Temp: 98.4 F (36.9 C)  SpO2: 100%    General appearance: alert, oriented, no acute distress but appears anxious; has someone on the phone with her during the entire visit Eyes: PERRLA; EOMI; conjunctivae normal HENT: normocephalic; atraumatic Neck: supple with FROM Lungs: without labored respirations; speaks full sentences without difficulty; CTAB Heart: regular rate and rhythm Chest Wall: without specific tenderness to palpation Abdomen: soft, obese Extremities: without edema; without calf swelling or tenderness; symmetrical without gross deformities Skin: warm and dry; without rash or lesions Neuro: normal gait Psychological: alert and cooperative; does appear anxious   Imaging: DG Chest 2 View  Result Date: 09/29/2021 CLINICAL DATA:  Chest pain EXAM: CHEST - 2 VIEW COMPARISON:  09/05/2016 FINDINGS: The heart size and mediastinal contours are within normal limits. Both lungs are clear. The visualized skeletal structures are unremarkable. IMPRESSION: No active cardiopulmonary disease. Electronically Signed   By: 10/01/2021 M.D.   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 Per consultation with Dr. Earlene Plater, no further work-up necessary. Per MFM: " The patient was reassured that the anti-Lewis antibodies are usually of the IgM subtype and therefore we will not cross the placenta and cause fetal anemia.  The Lewis antibodies should therefore not affect the management of her current pregnancy."   Vaginal trichomoniasis 05/09/2019   04/30/2019 on Pap [] tx [] TOC- neg   Social History   Socioeconomic History   Marital status: Single    Spouse name: Not on file   Number of children: Not on file   Years of education: Not on file   Highest education level: Associate degree: occupational, , or vocational program  Occupational History   Occupation: Customer Service    Comment: [employer]  Tobacco Use   Smoking status: Never   Smokeless tobacco: Never  Vaping Use   Vaping Use: Never used  Substance and Sexual Activity   Alcohol use: Never   Drug use: Never   Sexual activity: Not Currently    Comment: undecided  Other Topics Concern   Not on file  Social History Narrative   Not on file   Social Determinants of Health   Financial Resource Strain: Not on file  Food Insecurity: Not on file  Transportation Needs: Not on file  Physical Activity: Not on file  Stress: Not on file  Social Connections: Not on file  Intimate Partner Violence: Not on file   Family History  Problem Relation Age of Onset   Healthy Mother    Healthy Father    Past Surgical History:  Procedure Laterality Date   BRAIN SURGERY  Oct 01, 1995   stint placed when born   CHOLECYSTECTOMY N/A 01/25/2021   Procedure: LAPAROSCOPIC CHOLECYSTECTOMY WITH ATTEMPTED CHOLANGIOGRAM;  Surgeon: 12/1997, MD;  Location: [facility] OR;  Service: General;  Laterality: N/A;   ERCP N/A 01/29/2021   Procedure: ENDOSCOPIC RETROGRADE CHOLANGIOPANCREATOGRAPHY (ERCP);  Surgeon: [facility], MD;  Location: [facility] ENDOSCOPY;  Service: Endoscopy;  Laterality: N/A;   IR RADIOLOGIST EVAL & MGMT   03/23/2021   REMOVAL OF STONES  01/29/2021   Procedure: REMOVAL OF STONES;  Surgeon: 03/25/2021, MD;  Location: [facility] ENDOSCOPY;  Service: Endoscopy;;   SPHINCTEROTOMY  01/29/2021   Procedure: [facility];  Surgeon: 01/31/2021, MD;  Location: [facility] ENDOSCOPY;  Service: Endoscopy;;      Iva Boop, MD 09/29/21 1443    Mardella Layman, MD 09/29/21 1445

## 2021-09-29 NOTE — Discharge Instructions (Addendum)
Please continue taking your Pepcid and Nexium  Take hydroxyzine for anxiety  Please follow-up with cardiology for stress test if you have persistent pain.  Return to ER if you have worse chest pain, trouble breathing

## 2021-09-29 NOTE — ED Triage Notes (Signed)
Pt arrived POV from home c/o cramping in the center of her chest that started 3 weeks ago. Pt states the pain is constant. Pt endorses SHOB with the CP.

## 2021-09-29 NOTE — ED Provider Notes (Signed)
[facility] EMERGENCY DEPARTMENT Provider Note   CSN: [id number] Arrival date & time: 09/29/21  1215     History  Chief Complaint  Patient presents with   Chest Pain    Martha Hall is a 26 y.o. female hx of anxiety, here presenting with chest pain and left arm pain.  Patient has left-sided chest pain radiating down the arm for the last several weeks.  Patient thought she had GERD and has been taking Nexium and Pepcid with minimal relief.  Patient states that the pain is worse when she lays down.  Denies any abdominal pain.  Patient went to urgent care was sent here for further evaluation.  Patient actually has similar symptoms previously and was noted to have negative troponins but never follow-up with cardiology.  Patient states that she gets very anxious about her symptoms.  The history is provided by the patient.      Home Medications Prior to Admission medications   Medication Sig Start Date End Date Taking? Authorizing Provider  acetaminophen (TYLENOL) 500 MG tablet Take 1,000 mg by mouth every 6 (six) hours as needed for moderate pain or fever.    [provider]  esomeprazole (NEXIUM) 20 MG capsule Take 20 mg by mouth daily before breakfast.    [provider]  famotidine (PEPCID) 20 MG tablet Take 1 tablet (20 mg total) by mouth 2 (two) times daily. 01/21/21   Wallis Bamberg, PA-C  ibuprofen (ADVIL) 800 MG tablet Take 800 mg by mouth every 8 (eight) hours as needed for headache.    [provider]  meclizine (ANTIVERT) 25 MG tablet Take 25 mg by mouth 3 (three) times daily as needed for dizziness.    [provider]  prochlorperazine (COMPAZINE) 10 MG tablet Take 10 mg by mouth every 8 (eight) hours as needed for nausea or vomiting.    [provider]  Sodium Chloride Flush (NORMAL SALINE FLUSH) 0.9 % SOLN Flush drains with 5 ml of saline daily discard remaining 03/17/21   Gershon Crane, PA-C  SUMAtriptan  (IMITREX) 25 MG tablet Take 25 mg (1 tablet total) by mouth at the start of the headache. May repeat in 2 hours x 1 if headache persists. Max of 2 tablets/24 hours. 06/06/21   Rema Fendt, NP  tiZANidine (ZANAFLEX) 4 MG tablet Take 1 tablet (4 mg total) by mouth every 8 (eight) hours as needed for muscle spasms. 12/19/20   Rhys Martini, PA-C  diphenhydrAMINE (BENADRYL) 25 MG tablet Take 12.5 mg by mouth every 6 (six) hours as needed for allergies. Patient not taking: Reported on 12/30/2019  06/21/20  [provider]      Allergies    Metoclopramide    Review of Systems   Review of Systems  Cardiovascular:  Positive for chest pain.  All other systems reviewed and are negative.  Physical Exam Updated Vital Signs BP (!) 152/97 (BP Location: Right Arm)   Pulse 90   Temp 98 F (36.7 C) (Oral)   Resp 17   Ht 5' (1.524 m)   Wt 99.8 kg   LMP 09/22/2021   SpO2 100%   BMI 42.97 kg/m  Physical Exam Vitals and nursing note reviewed.  Constitutional:      Appearance: She is well-developed.     Comments: Anxious  HENT:     Head: Normocephalic.  Eyes:     Extraocular Movements: Extraocular movements intact.     Pupils: Pupils are equal, round, and  reactive to light.  Cardiovascular:     Rate and Rhythm: Normal rate and regular rhythm.     Heart sounds: Normal heart sounds.  Pulmonary:     Effort: Pulmonary effort is normal.     Breath sounds: Normal breath sounds.  Abdominal:     General: Bowel sounds are normal.     Palpations: Abdomen is soft.  Musculoskeletal:        General: Normal range of motion.     Cervical back: Normal range of motion and neck supple.  Skin:    General: Skin is warm.  Neurological:     General: No focal deficit present.     Mental Status: She is alert and oriented to person, place, and time.     Comments: Normal sensation and strength bilateral arms and legs.  No facial droop  Psychiatric:        Mood and Affect: Mood normal.         Behavior: Behavior normal.    ED Results / Procedures / Treatments   Labs (all labs ordered are listed, but only abnormal results are displayed) Labs Reviewed  BASIC METABOLIC PANEL - Abnormal; Notable for the following components:      Result Value   Chloride 112 (*)    All other components within normal limits  CBC  I-STAT BETA HCG BLOOD, ED ([facility], [facility], [facility] ONLY)  TROPONIN I (HIGH SENSITIVITY)  TROPONIN I (HIGH SENSITIVITY)    EKG EKG Interpretation  Date/Time:  Thursday Sep 29 2021 12:26:01 EDT Ventricular Rate:  78 PR Interval:  134 QRS Duration: 84 QT Interval:  372 QTC Calculation: 424 R Axis:   74 Text Interpretation: Normal sinus rhythm Normal ECG When compared with ECG of 29-Sep-2021 10:44, PREVIOUS ECG IS PRESENT Confirmed by Richardean CanalYao, Emrah Ariola H [phone]([id number]) on 09/29/2021 4:13:42 PM  Radiology DG Chest 2 View  Result Date: 09/29/2021 CLINICAL DATA:  Shortness of breath, chest tightness and numbness in LEFT arm for 3 weeks EXAM: CHEST - 2 VIEW COMPARISON:  None FINDINGS: Normal heart size, mediastinal contours, and pulmonary vascularity. Lungs clear. No pleural effusion or pneumothorax. Bones unremarkable. IMPRESSION: Normal exam. Electronically Signed   By: Ulyses SouthwardMark  Boles M.D.   On: 09/29/2021 13:19   DG Chest 2 View  Result Date: 09/29/2021 CLINICAL DATA:  Chest pain EXAM: CHEST - 2 VIEW COMPARISON:  09/05/2016 FINDINGS: The heart size and mediastinal contours are within normal limits. Both lungs are clear. The visualized skeletal structures are unremarkable. IMPRESSION: No active cardiopulmonary disease. Electronically Signed   By: Elige KoHetal  Patel M.D.   On: 09/29/2021 11:48    Procedures Procedures    Medications Ordered in ED Medications - No data to display  ED Course/ Medical Decision Making/ A&P                           Medical Decision Making Martha Robinette HainesMayes is a 26 y.o. female here presenting with chest pain and left arm pain.  This been going on for several weeks now.   Low suspicion for ACS.  Plan to get troponin x1 and labs and chest x-ray.  4:22 PM I reviewed patient's labs and independently reviewed her chest x-ray.  Troponin negative x2.  Chest x-ray is clear.  Patient is very anxious about her symptoms and would like something for anxiety.  Will prescribe hydroxyzine as needed.  Will refer to cardiology outpatient    Problems Addressed: Other chest pain: acute  illness or injury  Amount and/or Complexity of Data Reviewed Labs: ordered. Decision-making details documented in ED Course. Radiology: ordered and independent interpretation performed. Decision-making details documented in ED Course. ECG/medicine tests: ordered and independent interpretation performed. Decision-making details documented in ED Course.    Final Clinical Impression(s) / ED Diagnoses Final diagnoses:  None    Rx / DC Orders ED Discharge Orders     None         Charlynne Pander, MD 09/29/21 1622

## 2021-09-29 NOTE — Discharge Instructions (Signed)
You have been seen at the [facility] today for chest pain. Your evaluation today was not suggestive of any emergent condition requiring medical intervention at this time. Your ECG (heart tracing) and chest x-ray did not show any worrisome changes. However, some medical problems make take more time to appear. Therefore, it's very important that you pay attention to any new symptoms or worsening of your current condition.  Please proceed directly to the Emergency Department immediately should you feel worse in any way or have any of the following symptoms: increasing or different chest pain, pain that spreads to your arm, neck, jaw, back or abdomen, shortness of breath, or nausea and vomiting.  

## 2021-09-30 ENCOUNTER — Telehealth: Payer: Self-pay

## 2021-09-30 NOTE — Telephone Encounter (Signed)
Transition Care Management Follow-up Telephone Call Date of discharge and from where: 09/29/2021 from [facility] How have you been since you were released from the hospital? Patient stated that she is still having some chest tightness. Patient stated that she did not have any sx relief. Patient has not taken hydroxyzine today. Patient given same ED return precautions per ED recommendations.  Any questions or concerns? No  Items Reviewed: Did the pt receive and understand the discharge instructions provided? Yes  Medications obtained and verified? Yes  Other? No  Any new allergies since your discharge? No  Dietary orders reviewed? No Do you have support at home? Yes   Functional Questionnaire: (I = Independent and D = Dependent) ADLs: I  Bathing/Dressing- I  Meal Prep- I  Eating- I  Maintaining continence- I  Transferring/Ambulation- I  Managing Meds- I   Follow up appointments reviewed:  PCP Hospital f/u appt confirmed? No   Specialist Hospital f/u appt confirmed? Yes  Scheduled to see Peter Martinique, MD on 10/07/2021 @ 9:00 am. Are transportation arrangements needed? No  If their condition worsens, is the pt aware to call PCP or go to the Emergency Dept.? Yes Was the patient provided with contact information for the PCP's office or ED? Yes Was to pt encouraged to call back with questions or concerns? Yes

## 2021-10-02 NOTE — Progress Notes (Unsigned)
Cardiology Office Note   Date:  10/02/2021   ID:  Martha Hall, DOB 1995/11/08, MRN [id number]  PCP:  Martha Fendt, NP  Cardiologist:   Martha Forsberg [place], MD   No chief complaint on file.     History of Present Illness: Martha Hall is a 26 y.o. female who is seen at the request of Martha Stabs NP for evaluation of chest pain. She is generally healthy. She describes pain beginning 3 weeks ago. It started in her left shoulder and upper chest then migrated to her central chest. It is cramping and constant. No relief with tylenol or ibuprofen. Difficult to sleep with pain. Makes her anxious. Went to Urgent care then ED. Ecg, CXR and troponin levels all normal. Takes Nexium daily. Had choly in Sept.    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 Per consultation with Dr. Earlene Plater, no further work-up necessary. Per MFM: " The patient was reassured that the anti-Lewis antibodies are usually of the IgM subtype and therefore we will not cross the placenta and cause fetal anemia.  The Lewis antibodies should therefore not affect the management of her current pregnancy."   Vaginal trichomoniasis 05/09/2019   04/30/2019 on Pap [] tx [] TOC- neg    Past Surgical History:  Procedure Laterality Date   BRAIN SURGERY  [birth year]15[birth year]1997   stint placed when born   CHOLECYSTECTOMY N/A 01/25/2021   Procedure: LAPAROSCOPIC CHOLECYSTECTOMY WITH ATTEMPTED CHOLANGIOGRAM;  Surgeon: 12/1997, MD;  Location: [facility] OR;  Service: General;  Laterality: N/A;   ERCP N/A 01/29/2021   Procedure: ENDOSCOPIC RETROGRADE CHOLANGIOPANCREATOGRAPHY (ERCP);  Surgeon: [facility], MD;  Location: [facility] ENDOSCOPY;  Service: Endoscopy;  Laterality: N/A;   IR RADIOLOGIST EVAL & MGMT   03/23/2021   REMOVAL OF STONES  01/29/2021   Procedure: REMOVAL OF STONES;  Surgeon: 03/25/2021, MD;  Location: [facility] ENDOSCOPY;  Service: Endoscopy;;   SPHINCTEROTOMY  01/29/2021   Procedure: [facility];  Surgeon: 01/31/2021, MD;  Location: [facility] ENDOSCOPY;  Service: Endoscopy;;     Current Outpatient Medications  Medication Sig Dispense Refill   acetaminophen (TYLENOL) 500 MG tablet Take 1,000 mg by mouth every 6 (six) hours as needed for moderate pain or fever.     esomeprazole (NEXIUM) 20 MG capsule Take 1 capsule (20 mg total) by mouth daily before breakfast. 30 capsule 0   famotidine (PEPCID) 20 MG tablet Take 1 tablet (20 mg total) by mouth 2 (two) times daily. 30 tablet 0   hydrOXYzine (ATARAX) 25 MG tablet Take 1 tablet (25 mg total) by mouth every 8 (eight) hours as needed for anxiety. 12 tablet 0   ibuprofen (ADVIL) 800 MG tablet Take 800 mg by mouth every 8 (eight) hours as needed for headache.     meclizine (ANTIVERT) 25 MG tablet Take 25 mg by mouth 3 (three) times daily as needed for dizziness.     prochlorperazine (COMPAZINE) 10 MG tablet Take 10 mg by mouth every 8 (eight) hours as needed for nausea or vomiting.     Sodium Chloride Flush (NORMAL SALINE FLUSH) 0.9 % SOLN Flush drains with 5 ml of saline daily discard remaining 30 mL 0   SUMAtriptan (IMITREX) 25 MG tablet Take 25  mg (1 tablet total) by mouth at the start of the headache. May repeat in 2 hours x 1 if headache persists. Max of 2 tablets/24 hours. 30 tablet 0   tiZANidine (ZANAFLEX) 4 MG tablet Take 1 tablet (4 mg total) by mouth every 8 (eight) hours as needed for muscle spasms. 30 tablet 0   No current facility-administered medications for this visit.    Allergies:   Metoclopramide    Social History:  The patient  reports that she has never smoked. She has never used smokeless tobacco. She reports that she does not drink alcohol and does not use drugs.   Family History:  The patient's family history  includes Healthy in her father and mother.    ROS:  Please see the history of present illness.   Otherwise, review of systems are positive for none.   All other systems are reviewed and negative.    PHYSICAL EXAM: VS:  LMP 09/22/2021  , BMI There is no height or weight on file to calculate BMI. GEN: Well nourished, overweight, in no acute distress HEENT: normal Neck: no JVD, carotid bruits, or masses Cardiac: RRR; no murmurs, rubs, or gallops,no edema. Mild parasternal pain to palpitation Respiratory:  clear to auscultation bilaterally, normal work of breathing GI: soft, nontender, nondistended, + BS MS: no deformity or atrophy Skin: warm and dry, no rash Neuro:  Strength and sensation are intact Psych: euthymic mood, full affect   EKG:  EKG is not ordered today. The ekg ordered 09/29/21 demonstrates NSR rate 78. Normal Ecg. I have personally reviewed and interpreted this study.    Recent Labs: 06/06/2021: ALT 18; TSH 1.350 09/29/2021: BUN 9; Creatinine, Ser 0.78; Hemoglobin 13.0; Platelets 300; Potassium 4.2; Sodium 141    Lipid Panel    Component Value Date/Time   CHOL 132 06/06/2021 1134   TRIG 97 06/06/2021 1134   HDL 53 06/06/2021 1134   CHOLHDL 2.5 06/06/2021 1134   LDLCALC 61 06/06/2021 1134      Wt Readings from Last 3 Encounters:  09/29/21 220 lb (99.8 kg)  06/06/21 229 lb (103.9 kg)  03/17/21 217 lb (98.4 kg)      Other studies Reviewed: Additional studies/ records that were reviewed today include: none   ASSESSMENT AND PLAN:  1.  Atypical chest pain. Noncardiac based on history, age, lack of any risk factors and normal exam, Ecg, troponin. Recommend trying anti-inflammatory therapy with NSAID for 2 weeks. If no improvement should follow up with PCP. No further cardiac testing indicated.    Current medicines are reviewed at length with the patient today.  The patient does not have concerns regarding medicines.  The following changes have been made:   no change  Labs/ tests ordered today include:  No orders of the defined types were placed in this encounter.        Disposition:   FU PRN  Signed, Kesa Birky [place], MD  10/02/2021 3:12 PM    [facility] [address] [place], [place], [address] Phone [phone], Fax [phone]

## 2021-10-05 ENCOUNTER — Ambulatory Visit: Payer: Medicaid Other | Admitting: Internal Medicine

## 2021-10-05 ENCOUNTER — Encounter: Payer: Self-pay | Admitting: Physician Assistant

## 2021-10-05 ENCOUNTER — Ambulatory Visit: Payer: Self-pay | Admitting: *Deleted

## 2021-10-05 ENCOUNTER — Ambulatory Visit (INDEPENDENT_AMBULATORY_CARE_PROVIDER_SITE_OTHER): Payer: Medicaid Other | Admitting: Physician Assistant

## 2021-10-05 ENCOUNTER — Other Ambulatory Visit (HOSPITAL_COMMUNITY)
Admission: RE | Admit: 2021-10-05 | Discharge: 2021-10-05 | Disposition: A | Discharge status: Home / Self Care | Payer: Medicaid Other | Source: Ambulatory Visit | Attending: Physician Assistant | Admitting: Physician Assistant

## 2021-10-05 VITALS — BP 124/84 | HR 94 | Temp 98.0°F | Resp 18 | Ht 60.0 in | Wt 243.0 lb

## 2021-10-05 DIAGNOSIS — A5901 Trichomonal vulvovaginitis: Secondary | ICD-10-CM | POA: Insufficient documentation

## 2021-10-05 DIAGNOSIS — F411 Generalized anxiety disorder: Secondary | ICD-10-CM

## 2021-10-05 DIAGNOSIS — A749 Chlamydial infection, unspecified: Secondary | ICD-10-CM

## 2021-10-05 DIAGNOSIS — Z6841 Body Mass Index (BMI) 40.0 and over, adult: Secondary | ICD-10-CM

## 2021-10-05 DIAGNOSIS — Z113 Encounter for screening for infections with a predominantly sexual mode of transmission: Secondary | ICD-10-CM

## 2021-10-05 DIAGNOSIS — B3731 Acute candidiasis of vulva and vagina: Secondary | ICD-10-CM | POA: Diagnosis not present

## 2021-10-05 LAB — POCT URINALYSIS DIP (CLINITEK)
Bilirubin, UA: NEGATIVE
Blood, UA: NEGATIVE
Glucose, UA: NEGATIVE mg/dL
Ketones, POC UA: NEGATIVE mg/dL
Nitrite, UA: NEGATIVE
POC PROTEIN,UA: NEGATIVE
Spec Grav, UA: >=1.03 — AB (ref 1.010–1.025)
Urobilinogen, UA: 0.2 E.U./dL
pH, UA: 6.5 (ref 5.0–8.0)

## 2021-10-05 MED ORDER — TRAZODONE HCL 50 MG PO TABS: 50.0000 mg | ORAL_TABLET | Freq: Every day | ORAL | 0 refills | Status: DC

## 2021-10-05 NOTE — Telephone Encounter (Signed)
Reason for Disposition  Patient sounds very sick or weak to the triager  Answer Assessment - Initial Assessment Questions 1. LOCATION: "Where does it hurt?"       Pt c/o chest pain.   Going on for 3 wks.   It's under my arm and left chest started there.   I'm taking ibuprofen but it's not helping.   Now moved to middle of my chest.   I went to UC last week.   They didn't find anything wrong.   They sent me to the ED so I went.   I sat from 12-4 to tell me nothing is wrong.   They gave me medication for anxiety.   I don't feel right.   My arms are tingling, my chest hurts to breath.   They said nothing is wrong with me.  I tried to call Ricky Stabs, NP but she is booked up.    The ED set me up with a heart dr.   Rudean Curt to f/u with my PCP and the appt with the heart specialist.   It's Friday at 9:00 AM the heart dr.    2. RADIATION: "Does the pain go anywhere else?" (e.g., into neck, jaw, arms, back)     My left arm is tingling.  I can't sleep.  My chest is cramping. 3. ONSET: "When did the chest pain begin?" (Minutes, hours or days)      3 weeks ago it started.  I pulled a muscle in my shoulder in August.   It feels weird.    4. PATTERN "Does the pain come and go, or has it been constant since it started?"  "Does it get worse with exertion?"      Constant pain in chest. 5. DURATION: "How long does it last" (e.g., seconds, minutes, hours)     constant 6. SEVERITY: "How bad is the pain?"  (e.g., Scale 1-10; mild, moderate, or severe)    - MILD (1-3): doesn't interfere with normal activities     - MODERATE (4-7): interferes with normal activities or awakens from sleep    - SEVERE (8-10): excruciating pain, unable to do any normal activities       Moderate.  I have no one to take care of my 1 yr old son.   I need to find out what is wrong with me.    My heart beat is fast and I hear it in my ears.    7. CARDIAC RISK FACTORS: "Do you have any history of heart problems or risk factors for heart  disease?" (e.g., angina, prior heart attack; diabetes, high blood pressure, high cholesterol, smoker, or strong family history of heart disease)     No 8. PULMONARY RISK FACTORS: "Do you have any history of lung disease?"  (e.g., blood clots in lung, asthma, emphysema, birth control pills)     Not asked 9. CAUSE: "What do you think is causing the chest pain?"     I don't know 10. OTHER SYMPTOMS: "Do you have any other symptoms?" (e.g., dizziness, nausea, vomiting, sweating, fever, difficulty breathing, cough)       No dizziness.   Having headaches so I'm taking the medicine Amy Zonia Kief prescribed for me.    I'm having neck pain it's not so bad.   My chest pain is the worst.   I had my gallbladder removed.   I'm eating and drinking fine.  No vomiting or nausea.   Like menstrual cramps in my chest.  11. PREGNANCY: "Is there any chance you are pregnant?" "When was your last menstrual period?"       No   pregnancy test negative in ED.   I had period a week before my ED visit.  Protocols used: Chest Pain-A-AH

## 2021-10-05 NOTE — Telephone Encounter (Signed)
  Chief Complaint: cramping in chest, shortness of breath, rapid heart rate can hear in ears for 3 wks.  Seen in ED 09/29/2021 and urgent care.  Symptoms: continuing to have chest pain as listed above even though in ED found nothing wrong.   Frequency: constantly for 3 weeks Pertinent Negatives: Patient denies nausea, dizziness or passing out Disposition: [] ED /[] Urgent Care (no appt availability in office) / [x] Appointment(In office/virtual)/ []  [facility]/ [] Home Care/ [] Refused Recommended Disposition /[] [facility]/ []  Follow-up with PCP Additional Notes: Due to her symptom description ED was the disposition.   Since she has been seen in the ED and urgent care for same symptoms scheduled for today with , PA at [facility] for today at 1:40 in office visit with instruction to call 911 if symptoms worsen.   Pt verbalized understanding and thanked me very much for understanding and helping her.

## 2021-10-05 NOTE — Progress Notes (Unsigned)
   Established Patient Office Visit  Subjective   [name], female    DOB: 1995-11-25  Age: 26 y.o. MRN: [id number]  No chief complaint on file.    ED Course/ Medical Decision Making/ A&P                         Medical Decision Making Martha Hall is a 26 y.o. female here presenting with chest pain and left arm pain.  This been going on for several weeks now.  Low suspicion for ACS.  Plan to get troponin x1 and labs and chest x-ray.   4:22 PM I reviewed patient's labs and independently reviewed her chest x-ray.  Troponin negative x2.  Chest x-ray is clear.  Patient is very anxious about her symptoms and would like something for anxiety.  Will prescribe hydroxyzine as needed.  Will refer to cardiology outpatient     Problems Addressed: Other chest pain: acute illness or injury   Amount and/or Complexity of Data Reviewed Labs: ordered. Decision-making details documented in ED Course. Radiology: ordered and independent interpretation performed. Decision-making details documented in ED Course. ECG/medicine tests: ordered and independent interpretation performed. Decision-making details documented in ED Course.   [place] All during the day and night - hard time going to sleep - almost a month - both Melatonin and music -  High stress levels No family support -   Hydroxyzine made her heart race even faster  Screening for sti      {History (Optional):23778}  ROS    Objective:     LMP 09/22/2021  {Vitals History (Optional):23777}  Physical Exam   No results found for any visits on 10/05/21.  {Labs (Optional):23779}  The ASCVD Risk score (Arnett DK, et al., 2019) failed to calculate for the following reasons:   The 2019 ASCVD risk score is only valid for ages 23 to 73    Assessment & Plan:   Problem List Items Addressed This Visit   None   No follow-ups on file.    Martha Knudsen Mayers, PA-C

## 2021-10-05 NOTE — Patient Instructions (Signed)
You are going to start taking trazodone 50 mg at bedtime.  I encourage you to follow-up with the mobile unit in 2 weeks for further evaluation.  We will call you with today's lab results.  Roney Jaffe, PA-C Physician Assistant [facility] [email or web address]   Insomnia Insomnia is a sleep disorder that makes it difficult to fall asleep or stay asleep. Insomnia can cause fatigue, low energy, difficulty concentrating, mood swings, and poor performance at work or school. There are three different ways to classify insomnia: Difficulty falling asleep. Difficulty staying asleep. Waking up too early in the morning. Any type of insomnia can be long-term (chronic) or short-term (acute). Both are common. Short-term insomnia usually lasts for 3 months or less. Chronic insomnia occurs at least three times a week for longer than 3 months. What are the causes? Insomnia may be caused by another condition, situation, or substance, such as: Having certain mental health conditions, such as anxiety and depression. Using caffeine, alcohol, tobacco, or drugs. Having gastrointestinal conditions, such as gastroesophageal reflux disease (GERD). Having certain medical conditions. These include: Asthma. Alzheimer's disease. Stroke. Chronic pain. An overactive thyroid gland (hyperthyroidism). Other sleep disorders, such as restless legs syndrome and sleep apnea. Menopause. Sometimes, the cause of insomnia may not be known. What increases the risk? Risk factors for insomnia include: Gender. Females are affected more often than males. Age. Insomnia is more common as people get older. Stress and certain medical and mental health conditions. Lack of exercise. Having an irregular work schedule. This may include working night shifts and traveling between different time zones. What are the signs or symptoms? If you have insomnia, the main symptom is  having trouble falling asleep or having trouble staying asleep. This may lead to other symptoms, such as: Feeling tired or having low energy. Feeling nervous about going to sleep. Not feeling rested in the morning. Having trouble concentrating. Feeling irritable, anxious, or depressed. How is this diagnosed? This condition may be diagnosed based on: Your symptoms and medical history. Your health care provider may ask about: Your sleep habits. Any medical conditions you have. Your mental health. A physical exam. How is this treated? Treatment for insomnia depends on the cause. Treatment may focus on treating an underlying condition that is causing the insomnia. Treatment may also include: Medicines to help you sleep. Counseling or therapy. Lifestyle adjustments to help you sleep better. Follow these instructions at home: Eating and drinking  Limit or avoid alcohol, caffeinated beverages, and products that contain nicotine and tobacco, especially close to bedtime. These can disrupt your sleep. Do not eat a large meal or eat spicy foods right before bedtime. This can lead to digestive discomfort that can make it hard for you to sleep. Sleep habits  Keep a sleep diary to help you and your health care provider figure out what could be causing your insomnia. Write down: When you sleep. When you wake up during the night. How well you sleep and how rested you feel the next day. Any side effects of medicines you are taking. What you eat and drink. Make your bedroom a dark, comfortable place where it is easy to fall asleep. Put up shades or blackout curtains to block light from outside. Use a white noise machine to block noise. Keep the temperature cool. Limit screen use before bedtime. This includes: Not watching TV. Not using your smartphone, tablet, or computer. Stick to a routine that includes going to bed and waking up at the same  times every day and night. This can help you fall  asleep faster. Consider making a quiet activity, such as reading, part of your nighttime routine. Try to avoid taking naps during the day so that you sleep better at night. Get out of bed if you are still awake after 15 minutes of trying to sleep. Keep the lights down, but try reading or doing a quiet activity. When you feel sleepy, go back to bed. General instructions Take over-the-counter and prescription medicines only as told by your health care provider. Exercise regularly as told by your health care provider. However, avoid exercising in the hours right before bedtime. Use relaxation techniques to manage stress. Ask your health care provider to suggest some techniques that may work well for you. These may include: Breathing exercises. Routines to release muscle tension. Visualizing peaceful scenes. Make sure that you drive carefully. Do not drive if you feel very sleepy. Keep all follow-up visits. This is important. Contact a health care provider if: You are tired throughout the day. You have trouble in your daily routine due to sleepiness. You continue to have sleep problems, or your sleep problems get worse. Get help right away if: You have thoughts about hurting yourself or someone else. Get help right away if you feel like you may hurt yourself or others, or have thoughts about taking your own life. Go to your nearest emergency room or: Call 911. Call the National Suicide Prevention Lifeline at 801-466-99371-(757)014-6189 or 988. This is open 24 hours a day. Text the Crisis Text Line at 7041420633741741. Summary Insomnia is a sleep disorder that makes it difficult to fall asleep or stay asleep. Insomnia can be long-term (chronic) or short-term (acute). Treatment for insomnia depends on the cause. Treatment may focus on treating an underlying condition that is causing the insomnia. Keep a sleep diary to help you and your health care provider figure out what could be causing your insomnia. This  information is not intended to replace advice given to you by your health care provider. Make sure you discuss any questions you have with your health care provider. Document Revised: 04/04/2021 Document Reviewed: 04/04/2021 Elsevier Patient Education  2023 Elsevier Inc.  Managing Anxiety, Adult After being diagnosed with anxiety, you may be relieved to know why you have felt or behaved a certain way. You may also feel overwhelmed about the treatment ahead and what it will mean for your life. With care and support, you can manage this condition. How to manage lifestyle changes Managing stress and anxiety  Stress is your body's reaction to life changes and events, both good and bad. Most stress will last just a few hours, but stress can be ongoing and can lead to more than just stress. Although stress can play a major role in anxiety, it is not the same as anxiety. Stress is usually caused by something external, such as a deadline, test, or competition. Stress normally passes after the triggering event has ended.  Anxiety is caused by something internal, such as imagining a terrible outcome or worrying that something will go wrong that will devastate you. Anxiety often does not go away even after the triggering event is over, and it can become long-term (chronic) worry. It is important to understand the differences between stress and anxiety and to manage your stress effectively so that it does not lead to an anxious response. Talk with your health care provider or a counselor to learn more about reducing anxiety and stress. He or she  may suggest tension reduction techniques, such as: Music therapy. Spend time creating or listening to music that you enjoy and that inspires you. Mindfulness-based meditation. Practice being aware of your normal breaths while not trying to control your breathing. It can be done while sitting or walking. Centering prayer. This involves focusing on a word, phrase, or sacred  image that means something to you and brings you peace. Deep breathing. To do this, expand your stomach and inhale slowly through your nose. Hold your breath for 3-5 seconds. Then exhale slowly, letting your stomach muscles relax. Self-talk. Learn to notice and identify thought patterns that lead to anxiety reactions and change those patterns to thoughts that feel peaceful. Muscle relaxation. Taking time to tense muscles and then relax them. Choose a tension reduction technique that fits your lifestyle and personality. These techniques take time and practice. Set aside 5-15 minutes a day to do them. Therapists can offer counseling and training in these techniques. The training to help with anxiety may be covered by some insurance plans. Other things you can do to manage stress and anxiety include: Keeping a stress diary. This can help you learn what triggers your reaction and then learn ways to manage your response. Thinking about how you react to certain situations. You may not be able to control everything, but you can control your response. Making time for activities that help you relax and not feeling guilty about spending your time in this way. Doing visual imagery. This involves imagining or creating mental pictures to help you relax. Practicing yoga. Through yoga poses, you can lower tension and promote relaxation.  Medicines Medicines can help ease symptoms. Medicines for anxiety include: Antidepressant medicines. These are usually prescribed for long-term daily control. Anti-anxiety medicines. These may be added in severe cases, especially when panic attacks occur. Medicines will be prescribed by a health care provider. When used together, medicines, psychotherapy, and tension reduction techniques may be the most effective treatment. Relationships Relationships can play a big part in helping you recover. Try to spend more time connecting with trusted friends and family members. Consider  going to couples counseling if you have a partner, taking family education classes, or going to family therapy. Therapy can help you and others better understand your condition. How to recognize changes in your anxiety Everyone responds differently to treatment for anxiety. Recovery from anxiety happens when symptoms decrease and stop interfering with your daily activities at home or work. This may mean that you will start to: Have better concentration and focus. Worry will interfere less in your daily thinking. Sleep better. Be less irritable. Have more energy. Have improved memory. It is also important to recognize when your condition is getting worse. Contact your health care provider if your symptoms interfere with home or work and you feel like your condition is not improving. Follow these instructions at home: Activity Exercise. Adults should do the following: Exercise for at least 150 minutes each week. The exercise should increase your heart rate and make you sweat (moderate-intensity exercise). Strengthening exercises at least twice a week. Get the right amount and quality of sleep. Most adults need 7-9 hours of sleep each night. Lifestyle  Eat a healthy diet that includes plenty of vegetables, fruits, whole grains, low-fat dairy products, and lean protein. Do not eat a lot of foods that are high in fats, added sugars, or salt (sodium). Make choices that simplify your life. Do not use any products that contain nicotine or tobacco. These products include  cigarettes, chewing tobacco, and vaping devices, such as e-cigarettes. If you need help quitting, ask your health care provider. Avoid caffeine, alcohol, and certain over-the-counter cold medicines. These may make you feel worse. Ask your pharmacist which medicines to avoid. General instructions Take over-the-counter and prescription medicines only as told by your health care provider. Keep all follow-up visits. This is  important. Where to find support You can get help and support from these sources: Self-help groups. Online and [employer]. A trusted spiritual leader. Couples counseling. Family education classes. Family therapy. Where to find more information You may find that joining a support group helps you deal with your anxiety. The following sources can help you locate counselors or support groups near you: Mental Health America: www.mentalhealthamerica.net Anxiety and Depression Association of [place] (ADAA): ProgramCam.de [employer] on Mental Illness (NAMI): www.nami.org Contact a health care provider if: You have a hard time staying focused or finishing daily tasks. You spend many hours a day feeling worried about everyday life. You become exhausted by worry. You start to have headaches or frequently feel tense. You develop chronic nausea or diarrhea. Get help right away if: You have a racing heart and shortness of breath. You have thoughts of hurting yourself or others. If you ever feel like you may hurt yourself or others, or have thoughts about taking your own life, get help right away. Go to your nearest emergency department or: Call your local emergency services (911 in the [place] Call a suicide crisis helpline, such as the National Suicide Prevention Lifeline at (850) 102-4324 or 988 in the [place] This is open 24 hours a day in the [place] Text the Crisis Text Line at 236 525 1192 (in the [place] Summary Taking steps to learn and use tension reduction techniques can help calm you and help prevent triggering an anxiety reaction. When used together, medicines, psychotherapy, and tension reduction techniques may be the most effective treatment. Family, friends, and partners can play a big part in supporting you. This information is not intended to replace advice given to you by your health care provider. Make sure you discuss any questions you have with your health care  provider. Document Revised: 11/17/2020 Document Reviewed: 08/15/2020 Elsevier Patient Education  2023 [employer].

## 2021-10-05 NOTE — Telephone Encounter (Signed)
Patient has been seen today for concern.

## 2021-10-06 DIAGNOSIS — A749 Chlamydial infection, unspecified: Secondary | ICD-10-CM | POA: Insufficient documentation

## 2021-10-06 DIAGNOSIS — B3731 Acute candidiasis of vulva and vagina: Secondary | ICD-10-CM | POA: Insufficient documentation

## 2021-10-06 LAB — CERVICOVAGINAL ANCILLARY ONLY
Bacterial Vaginitis (gardnerella): NEGATIVE
Candida Glabrata: NEGATIVE
Candida Vaginitis: POSITIVE — AB
Chlamydia: POSITIVE — AB
Comment: NEGATIVE
Comment: NEGATIVE
Comment: NEGATIVE
Comment: NEGATIVE
Comment: NEGATIVE
Comment: NORMAL
Neisseria Gonorrhea: NEGATIVE
Trichomonas: NEGATIVE

## 2021-10-06 MED ORDER — FLUCONAZOLE 150 MG PO TABS
150.0000 mg | ORAL_TABLET | Freq: Once | ORAL | 0 refills | Status: AC
Start: 2021-10-06 — End: 2021-10-06

## 2021-10-06 MED ORDER — DOXYCYCLINE HYCLATE 100 MG PO CAPS: 100.0000 mg | ORAL_CAPSULE | Freq: Two times a day (BID) | ORAL | 0 refills | Status: DC

## 2021-10-06 NOTE — Addendum Note (Signed)
Addended by: Roney Jaffe on: 10/06/2021 02:32 PM   Modules accepted: Orders

## 2021-10-07 ENCOUNTER — Ambulatory Visit (INDEPENDENT_AMBULATORY_CARE_PROVIDER_SITE_OTHER): Payer: Medicaid Other | Admitting: Cardiology

## 2021-10-07 ENCOUNTER — Encounter: Payer: Self-pay | Admitting: Cardiology

## 2021-10-07 VITALS — BP 102/78 | HR 88 | Ht 60.0 in | Wt 241.6 lb

## 2021-10-07 DIAGNOSIS — R0789 Other chest pain: Secondary | ICD-10-CM

## 2021-10-07 LAB — URINE CULTURE

## 2021-10-07 NOTE — Patient Instructions (Addendum)
Medication Instructions:     May use some Ibuprofen for your discomfort   *If you need a refill on your cardiac medications before your next appointment, please call your pharmacy*   Lab Work: Not needed   Testing/Procedures:  Not needed  Follow-Up: At [facility], you and your health needs are our priority.  As part of our continuing mission to provide you with exceptional heart care, we have created designated Provider Care Teams.  These Care Teams include your primary Cardiologist (physician) and Advanced Practice Providers (APPs -  Physician Assistants and Nurse Practitioners) who all work together to provide you with the care you need, when you need it.     Your next appointment:   As needed  The format for your next appointment:   In Person  Provider:   Dr Peter Martinique   Other Instructions

## 2021-10-10 ENCOUNTER — Telehealth: Payer: Self-pay

## 2021-10-10 NOTE — Telephone Encounter (Signed)
-----   Message from Kennieth Rad, [place] sent at 10/06/2021  2:32 PM EDT ----- Please call patient and let her know that her vaginal swab was positive for chlamydia and a yeast infection.  She needs to take doxycycline twice a day for the next 7 days as well as a dose of Diflucan.  She needs to inform all partners and encourage them to be tested/treated.  She needs to refrain from sexual intercourse until treatment is complete.  She should have a test of cure completed in 3 months.  Prescription sent to her pharmacy.  Urine culture is still pending.

## 2021-10-10 NOTE — Telephone Encounter (Signed)
-----   Message from Cari S Mayers, PA-C sent at 10/06/2021  2:32 PM EDT ----- Please call patient and let her know that her vaginal swab was positive for chlamydia and a yeast infection.  She needs to take doxycycline twice a day for the next 7 days as well as a dose of Diflucan.  She needs to inform all partners and encourage them to be tested/treated.  She needs to refrain from sexual intercourse until treatment is complete.  She should have a test of cure completed in 3 months.  Prescription sent to her pharmacy.  Urine culture is still pending. 

## 2021-10-14 ENCOUNTER — Encounter: Payer: Self-pay | Admitting: Physician Assistant

## 2021-10-14 ENCOUNTER — Telehealth: Payer: Self-pay | Admitting: *Deleted

## 2021-10-14 ENCOUNTER — Ambulatory Visit: Payer: Self-pay | Admitting: *Deleted

## 2021-10-14 NOTE — Telephone Encounter (Signed)
Error

## 2021-10-14 NOTE — Telephone Encounter (Signed)
See earlier MyChart message from today. Pt calling to F/U on request. Pt states CP "Not worse, but not any better." Saw Cardiologist "[facility] nothing they can do for me, told me to take ibuprofen."  Pt requesting FMLA, has forms, questioning if they can be completed, how to get them sent to practice. Please advise.

## 2021-10-18 NOTE — Telephone Encounter (Signed)
Pt called back wanting to know how to get her FMLA paperwork to [facility]. Pt seen by Cari on 10/05/21.   I advised pt she can hand deliver to the office or mobile unit (she said Cari told her come back for walk in appt in 2-3 days anyway.) Or she can have her employer fax the paperwork to the office. Pt verbalized understanding and given location of mobile unit.

## 2021-10-21 ENCOUNTER — Telehealth: Payer: Self-pay | Admitting: Family

## 2021-10-21 NOTE — Telephone Encounter (Signed)
Pt dropped off FMLA paperwork  for anxiety and depression and scheduled appt for completion of this. Paperwork was placed in provider bin.

## 2021-10-25 NOTE — Progress Notes (Deleted)
[name], female    DOB: 12/19/1995  MRN: [id number]  CC: FMLA paperwork  Subjective: Martha Hall is a 26 y.o. female who presents for FMLA paperwork.   Her concerns today include:   Last appointment 10/05/2021 with Maurene Capes, PA  1. GAD (generalized anxiety disorder) Trial of trazodone.  Patient education given on supportive care.  Patient encouraged to follow-up with counselor for CBT. - traZODone (DESYREL) 50 MG tablet; Take 1 tablet (50 mg total) by mouth at bedtime.  Dispense: 30 tablet; Refill: 0   Patient Active Problem List   Diagnosis Date Noted   Chlamydia 10/06/2021   Vaginal yeast infection 10/06/2021   Prediabetes 06/07/2021   Trichomoniasis of vagina 06/07/2021   Hospital discharge follow-up 02/04/2021   Choledocholithiasis    Cholelithiasis 01/25/2021   History of hydrocephalus 06/11/2019   History of blood clot in brain 05/28/2019   Alpha thalassemia silent carrier 05/12/2019   Class 3 severe obesity due to excess calories without serious comorbidity with body mass index (BMI) of 45.0 to 49.9 in adult [facility]) 04/30/2019     Current Outpatient Medications on File Prior to Visit  Medication Sig Dispense Refill   doxycycline (VIBRAMYCIN) 100 MG capsule Take 1 capsule (100 mg total) by mouth 2 (two) times daily. 20 capsule 0   famotidine (PEPCID) 20 MG tablet Take 1 tablet (20 mg total) by mouth 2 (two) times daily. 30 tablet 0   hydrOXYzine (ATARAX) 25 MG tablet Take 1 tablet (25 mg total) by mouth every 8 (eight) hours as needed for anxiety. 12 tablet 0   SUMAtriptan (IMITREX) 25 MG tablet Take 25 mg (1 tablet total) by mouth at the start of the headache. May repeat in 2 hours x 1 if headache persists. Max of 2 tablets/24 hours. 30 tablet 0   tiZANidine (ZANAFLEX) 4 MG tablet Take 1 tablet (4 mg total) by mouth every 8 (eight) hours as needed for muscle spasms. 30 tablet 0   traZODone (DESYREL) 50 MG tablet Take 1 tablet (50 mg total) by  mouth at bedtime. 30 tablet 0   [DISCONTINUED] diphenhydrAMINE (BENADRYL) 25 MG tablet Take 12.5 mg by mouth every 6 (six) hours as needed for allergies. (Patient not taking: Reported on 12/30/2019)     No current facility-administered medications on file prior to visit.    Allergies  Allergen Reactions   Metoclopramide Hives and Itching    Social History   Socioeconomic History   Marital status: Single    Spouse name: Not on file   Number of children: Not on file   Years of education: Not on file   Highest education level: Associate degree: occupational, Scientist, product/process development, or vocational program  Occupational History   Occupation: Customer Service    Comment: [employer]  Tobacco Use   Smoking status: Never   Smokeless tobacco: Never  Vaping Use   Vaping Use: Never used  Substance and Sexual Activity   Alcohol use: Never   Drug use: Never   Sexual activity: Not Currently    Comment: undecided  Other Topics Concern   Not on file  Social History Narrative   Not on file   Social Determinants of Health   Financial Resource Strain: Not on file  Food Insecurity: Not on file  Transportation Needs: Not on file  Physical Activity: Not on file  Stress: Not on file  Social Connections: Not on file  Intimate Partner Violence: Not on file    Family History  Problem Relation Age of Onset   Healthy Mother    Healthy Father     Past Surgical History:  Procedure Laterality Date   BRAIN SURGERY  04-10-1998   stint placed when born   CHOLECYSTECTOMY N/A 01/25/2021   Procedure: LAPAROSCOPIC CHOLECYSTECTOMY WITH ATTEMPTED CHOLANGIOGRAM;  Surgeon: Abigail Miyamoto, MD;  Location: [facility] OR;  Service: General;  Laterality: N/A;   ERCP N/A 01/29/2021   Procedure: ENDOSCOPIC RETROGRADE CHOLANGIOPANCREATOGRAPHY (ERCP);  Surgeon: Iva Boop, MD;  Location: [facility] ENDOSCOPY;  Service: Endoscopy;  Laterality: N/A;   IR RADIOLOGIST EVAL & MGMT  03/23/2021   REMOVAL OF STONES  01/29/2021   Procedure:  REMOVAL OF STONES;  Surgeon: Iva Boop, MD;  Location: [facility] ENDOSCOPY;  Service: Endoscopy;;   SPHINCTEROTOMY  01/29/2021   Procedure: Dennison Mascot;  Surgeon: Iva Boop, MD;  Location: [facility] ENDOSCOPY;  Service: Endoscopy;;    ROS: Review of Systems Negative except as stated above  PHYSICAL EXAM: There were no vitals taken for this visit.  Physical Exam  {female adult master:310786} {female adult master:[id number]}     Latest Ref Rng & Units 09/29/2021   12:50 PM 06/06/2021   11:34 AM 02/04/2021   11:53 AM  CMP  Glucose 70 - 99 mg/dL 92   867   BUN 6 - 20 mg/dL 9   8   Creatinine 6.72 - 1.00 mg/dL 0.94   7.09   Sodium 628 - 145 mmol/L 141   137   Potassium 3.5 - 5.1 mmol/L 4.2   3.8   Chloride 98 - 111 mmol/L 112   108   CO2 22 - 32 mmol/L 24   23   Calcium 8.9 - 10.3 mg/dL 9.1   8.7   Total Protein 6.5 - 8.1 g/dL   6.5   Total Bilirubin 0.3 - 1.2 mg/dL   1.9   Alkaline Phos 38 - 126 U/L   109   AST 15 - 41 U/L   26   ALT 0 - 32 IU/L  18  62    Lipid Panel     Component Value Date/Time   CHOL 132 06/06/2021 1134   TRIG 97 06/06/2021 1134   HDL 53 06/06/2021 1134   CHOLHDL 2.5 06/06/2021 1134   LDLCALC 61 06/06/2021 1134    CBC    Component Value Date/Time   WBC 5.8 09/29/2021 1250   RBC 4.52 09/29/2021 1250   HGB 13.0 09/29/2021 1250   HGB 11.8 09/04/2019 1017   HCT 39.7 09/29/2021 1250   HCT 36.7 09/04/2019 1017   PLT 300 09/29/2021 1250   PLT 261 09/04/2019 1017   MCV 87.8 09/29/2021 1250   MCV 86 09/04/2019 1017   MCH 28.8 09/29/2021 1250   MCHC 32.7 09/29/2021 1250   RDW 12.5 09/29/2021 1250   RDW 13.5 09/04/2019 1017   LYMPHSABS 1.4 02/04/2021 1153   LYMPHSABS 1.2 04/30/2019 1051   MONOABS 0.9 02/04/2021 1153   EOSABS 0.0 02/04/2021 1153   EOSABS 0.1 04/30/2019 1051   BASOSABS 0.0 02/04/2021 1153   BASOSABS 0.0 04/30/2019 1051    ASSESSMENT AND PLAN:  There are no diagnoses linked to this encounter.   Patient was given the opportunity  to ask questions.  Patient verbalized understanding of the plan and was able to repeat key elements of the plan. Patient was given clear instructions to go to Emergency Department or return to medical center if symptoms don't improve, worsen, or new problems develop.The patient verbalized understanding.  No orders of the defined types were placed in this encounter.    Requested Prescriptions    No prescriptions requested or ordered in this encounter    No follow-ups on file.  Camillia Herter, NP

## 2021-10-26 ENCOUNTER — Ambulatory Visit (INDEPENDENT_AMBULATORY_CARE_PROVIDER_SITE_OTHER): Payer: Medicaid Other | Admitting: Family

## 2021-10-26 ENCOUNTER — Other Ambulatory Visit (HOSPITAL_COMMUNITY)
Admission: RE | Admit: 2021-10-26 | Discharge: 2021-10-26 | Disposition: A | Discharge status: Home / Self Care | Payer: Medicaid Other | Source: Ambulatory Visit | Attending: Family | Admitting: Family

## 2021-10-26 VITALS — BP 124/83 | Resp 18 | Wt 231.0 lb

## 2021-10-26 DIAGNOSIS — Z113 Encounter for screening for infections with a predominantly sexual mode of transmission: Secondary | ICD-10-CM | POA: Insufficient documentation

## 2021-10-26 DIAGNOSIS — F419 Anxiety disorder, unspecified: Secondary | ICD-10-CM

## 2021-10-26 DIAGNOSIS — Z3202 Encounter for pregnancy test, result negative: Secondary | ICD-10-CM

## 2021-10-26 DIAGNOSIS — Z6841 Body Mass Index (BMI) 40.0 and over, adult: Secondary | ICD-10-CM | POA: Diagnosis not present

## 2021-10-26 DIAGNOSIS — F32A Depression, unspecified: Secondary | ICD-10-CM | POA: Diagnosis not present

## 2021-10-26 DIAGNOSIS — Z0289 Encounter for other administrative examinations: Secondary | ICD-10-CM

## 2021-10-26 DIAGNOSIS — N926 Irregular menstruation, unspecified: Secondary | ICD-10-CM

## 2021-10-26 LAB — POCT URINALYSIS DIP (CLINITEK)
Glucose, UA: NEGATIVE mg/dL
Leukocytes, UA: NEGATIVE
Nitrite, UA: NEGATIVE
POC PROTEIN,UA: 30 — AB
Spec Grav, UA: >=1.03 — AB (ref 1.010–1.025)
Urobilinogen, UA: 1 E.U./dL
pH, UA: 5.5 (ref 5.0–8.0)

## 2021-10-26 LAB — POCT URINE PREGNANCY: Preg Test, Ur: NEGATIVE

## 2021-10-26 NOTE — Progress Notes (Signed)
[name], female    DOB: Sep 22, 1995  MRN: [id number]  CC: FMLA paperwork   Subjective: Martha Hall is a 26 y.o. female who presents for FMLA paperwork.   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 She is currently placed at [employer] through [employer]. Requesting FMLA and reasonable accommodations for anxiety depression. Reports when anxiety depression heightened she is unable to complete job duties which includes computer/desk work and frequent phone calls. Reports she has trouble concentrating and focusing especially during the day. Trazodone helps with sleep at night. Reports she was prescribed Hydroxyzine in the past and made chest palpitations worse (managed per Cardiology). Reports she was told chest pain is likely musculoskeletal or related to anxiety depression. Reports her work schedule recently changed which is creating more stress. She was working Monday through Friday. Currently her only days off are Sundays and Thursdays. She does see a therapist, Martha Hall, at [facility] and established with the same for the past 2 years. She is requesting a pregnancy test. Her period is 6 days late.    Patient Active Problem List   Diagnosis Date Noted   Chlamydia 10/06/2021   Vaginal yeast infection 10/06/2021   Prediabetes 06/07/2021   Trichomoniasis of vagina 06/07/2021   Hospital discharge follow-up 02/04/2021   Choledocholithiasis    Cholelithiasis 01/25/2021   History of hydrocephalus 06/11/2019   History of blood clot in brain 05/28/2019   Alpha thalassemia silent carrier 05/12/2019   Class 3 severe obesity due to excess calories without serious comorbidity with body mass index (BMI) of 45.0 to 49.9 in adult [facility]) 04/30/2019     Current Outpatient Medications on File Prior to Visit  Medication Sig Dispense Refill   doxycycline (VIBRAMYCIN) 100 MG  capsule Take 1 capsule (100 mg total) by mouth 2 (two) times daily. 20 capsule 0   famotidine (PEPCID) 20 MG tablet Take 1 tablet (20 mg total) by mouth 2 (two) times daily. 30 tablet 0   hydrOXYzine (ATARAX) 25 MG tablet Take 1 tablet (25 mg total) by mouth every 8 (eight) hours as needed for anxiety. 12 tablet 0   SUMAtriptan (IMITREX) 25 MG tablet Take 25 mg (1 tablet total) by mouth at the start of the headache. May repeat in 2 hours x 1 if headache persists. Max of 2 tablets/24 hours. 30 tablet 0   tiZANidine (ZANAFLEX) 4 MG tablet Take 1 tablet (4 mg total) by mouth every 8 (eight) hours as needed for muscle spasms. 30 tablet 0   traZODone (DESYREL) 50 MG tablet Take 1 tablet (50 mg total) by mouth at bedtime. 30 tablet 0   [DISCONTINUED] diphenhydrAMINE (BENADRYL) 25 MG tablet Take 12.5 mg by mouth every 6 (six) hours as needed for allergies. (Patient not taking: Reported on 12/30/2019)     No current facility-administered medications on file prior to visit.    Allergies  Allergen Reactions   Metoclopramide Hives and Itching    Social History   Socioeconomic History   Marital status: Single    Spouse name: Not on file   Number of children: Not on file   Years of education: Not on file   Highest education level: Associate degree: occupational, Scientist, product/process development, or vocational program  Occupational History   Occupation: Customer Service    Comment: [employer]  Tobacco Use   Smoking status: Never   Smokeless tobacco: Never  Vaping Use  Vaping Use: Never used  Substance and Sexual Activity   Alcohol use: Never   Drug use: Never   Sexual activity: Not Currently    Comment: undecided  Other Topics Concern   Not on file  Social History Narrative   Not on file   Social Determinants of Health   Financial Resource Strain: Not on file  Food Insecurity: Not on file  Transportation Needs: Not on file  Physical Activity: Not on file  Stress: Not on file  Social Connections: Not on file   Intimate Partner Violence: Not on file    Family History  Problem Relation Age of Onset   Healthy Mother    Healthy Father     Past Surgical History:  Procedure Laterality Date   BRAIN SURGERY  21-Sep-1995   stint placed when born   CHOLECYSTECTOMY N/A 01/25/2021   Procedure: LAPAROSCOPIC CHOLECYSTECTOMY WITH ATTEMPTED CHOLANGIOGRAM;  Surgeon: Abigail Miyamoto, MD;  Location: [facility] OR;  Service: General;  Laterality: N/A;   ERCP N/A 01/29/2021   Procedure: ENDOSCOPIC RETROGRADE CHOLANGIOPANCREATOGRAPHY (ERCP);  Surgeon: Iva Boop, MD;  Location: [facility] ENDOSCOPY;  Service: Endoscopy;  Laterality: N/A;   IR RADIOLOGIST EVAL & MGMT  03/23/2021   REMOVAL OF STONES  01/29/2021   Procedure: REMOVAL OF STONES;  Surgeon: Iva Boop, MD;  Location: [facility] ENDOSCOPY;  Service: Endoscopy;;   SPHINCTEROTOMY  01/29/2021   Procedure: Dennison Mascot;  Surgeon: Iva Boop, MD;  Location: [facility] ENDOSCOPY;  Service: Endoscopy;;    ROS: Review of Systems Negative except as stated above  PHYSICAL EXAM: BP 124/83 (BP Location: Left Arm, Patient Position: Sitting)   Resp 18   Wt 231 lb (104.8 kg)   LMP 09/22/2021   BMI 45.11 kg/m   Physical Exam HENT:     Head: Normocephalic and atraumatic.  Eyes:     Extraocular Movements: Extraocular movements intact.     Conjunctiva/sclera: Conjunctivae normal.     Pupils: Pupils are equal, round, and reactive to light.  Cardiovascular:     Rate and Rhythm: Normal rate and regular rhythm.     Pulses: Normal pulses.     Heart sounds: Normal heart sounds.  Pulmonary:     Effort: Pulmonary effort is normal.     Breath sounds: Normal breath sounds.  Musculoskeletal:     Cervical back: Normal range of motion and neck supple.  Neurological:     General: No focal deficit present.     Mental Status: She is alert and oriented to person, place, and time.  Psychiatric:        Mood and Affect: Mood normal.        Behavior: Behavior normal.    Results for  orders placed or performed in visit on 10/26/21  POCT URINALYSIS DIP (CLINITEK)  Result Value Ref Range   Color, UA yellow yellow   Clarity, UA cloudy (A) clear   Glucose, UA negative negative mg/dL   Bilirubin, UA small (A) negative   Ketones, POC UA small (15) (A) negative mg/dL   Spec Grav, UA >=2.694 (A) 1.010 - 1.025   Blood, UA trace-intact (A) negative   pH, UA 5.5 5.0 - 8.0   POC PROTEIN,UA =30 (A) negative, trace   Urobilinogen, UA 1.0 0.2 or 1.0 E.U./dL   Nitrite, UA Negative Negative   Leukocytes, UA Negative Negative  POCT urine pregnancy  Result Value Ref Range   Preg Test, Ur Negative Negative    ASSESSMENT AND PLAN: 1. Encounter for completion  of form with patient 2. Anxiety and depression - FMLA and ADA Job Accommodation Request and Medical Injury Form completed today in office. CMA will fax completed document. - Continue Trazodone as prescribed. - Continue counseling sessions with Elray Buba, LCSW at [facility]. - Referral to Psychiatry for further evaluation and management.  - Ambulatory referral to Psychiatry  3. Late menses - Urine pregnancy negative. hCG serum added as confirmatory per patient request.  - POCT urine pregnancy - hCG, serum, qualitative  4. Routine screening for STI (sexually transmitted infection) - No urinary tract infection.  - Cervicovaginal self-swab to screen for chlamydia, gonorrhea, trichomonas, bacterial vaginitis, and candida vaginitis. - POCT URINALYSIS DIP (CLINITEK) - Cervicovaginal ancillary only   Patient was given the opportunity to ask questions.  Patient verbalized understanding of the plan and was able to repeat key elements of the plan. Patient was given clear instructions to go to Emergency Department or return to medical center if symptoms don't improve, worsen, or new problems develop.The patient verbalized understanding.   Orders Placed This Encounter  Procedures   hCG, serum, qualitative    Ambulatory referral to Psychiatry   POCT URINALYSIS DIP (CLINITEK)   POCT urine pregnancy    Follow-up with primary provider as scheduled.   Rema Fendt, NP

## 2021-10-26 NOTE — Progress Notes (Signed)
Pt presents for FMLA completion. Desires pregnancy test missed period for about 6 days now and lower back pain and tender pain in breast

## 2021-10-27 ENCOUNTER — Other Ambulatory Visit: Payer: Self-pay | Admitting: Physician Assistant

## 2021-10-27 DIAGNOSIS — F411 Generalized anxiety disorder: Secondary | ICD-10-CM

## 2021-10-27 LAB — CERVICOVAGINAL ANCILLARY ONLY
Bacterial Vaginitis (gardnerella): NEGATIVE
Candida Glabrata: NEGATIVE
Candida Vaginitis: NEGATIVE
Chlamydia: NEGATIVE
Comment: NEGATIVE
Comment: NEGATIVE
Comment: NEGATIVE
Comment: NEGATIVE
Comment: NEGATIVE
Comment: NORMAL
Neisseria Gonorrhea: NEGATIVE
Trichomonas: NEGATIVE

## 2021-10-27 LAB — HCG, SERUM, QUALITATIVE: hCG,Beta Subunit,Qual,Serum: NEGATIVE m[IU]/mL (ref <6)

## 2021-10-31 ENCOUNTER — Ambulatory Visit: Payer: Medicaid Other | Admitting: Family

## 2021-10-31 DIAGNOSIS — F411 Generalized anxiety disorder: Secondary | ICD-10-CM

## 2021-10-31 DIAGNOSIS — Z0289 Encounter for other administrative examinations: Secondary | ICD-10-CM

## 2021-11-24 DIAGNOSIS — F411 Generalized anxiety disorder: Secondary | ICD-10-CM | POA: Diagnosis not present

## 2021-12-14 ENCOUNTER — Encounter (HOSPITAL_COMMUNITY): Payer: Self-pay | Admitting: Psychiatry

## 2021-12-14 ENCOUNTER — Ambulatory Visit (INDEPENDENT_AMBULATORY_CARE_PROVIDER_SITE_OTHER): Payer: Medicaid Other | Admitting: Psychiatry

## 2021-12-14 VITALS — BP 128/76 | HR 66 | Ht 60.0 in | Wt 230.0 lb

## 2021-12-14 DIAGNOSIS — F411 Generalized anxiety disorder: Secondary | ICD-10-CM | POA: Diagnosis not present

## 2021-12-14 DIAGNOSIS — F331 Major depressive disorder, recurrent, moderate: Secondary | ICD-10-CM | POA: Diagnosis not present

## 2021-12-14 DIAGNOSIS — F4310 Post-traumatic stress disorder, unspecified: Secondary | ICD-10-CM

## 2021-12-14 MED ORDER — GABAPENTIN 300 MG PO CAPS: 300.0000 mg | ORAL_CAPSULE | Freq: Three times a day (TID) | ORAL | 3 refills | Status: DC

## 2021-12-14 MED ORDER — TRAZODONE HCL 100 MG PO TABS: 100.0000 mg | ORAL_TABLET | Freq: Every evening | ORAL | 3 refills | Status: DC | PRN

## 2021-12-14 MED ORDER — PRAZOSIN HCL 1 MG PO CAPS: 1.0000 mg | ORAL_CAPSULE | Freq: Every day | ORAL | 3 refills | Status: DC

## 2021-12-14 MED ORDER — SERTRALINE HCL 25 MG PO TABS: 25.0000 mg | ORAL_TABLET | Freq: Every day | ORAL | 3 refills | Status: DC
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
## 2021-12-15 ENCOUNTER — Encounter (HOSPITAL_COMMUNITY): Payer: Self-pay

## 2021-12-15 ENCOUNTER — Ambulatory Visit (INDEPENDENT_AMBULATORY_CARE_PROVIDER_SITE_OTHER): Payer: Medicaid Other | Admitting: Mental Health

## 2021-12-15 DIAGNOSIS — F331 Major depressive disorder, recurrent, moderate: Secondary | ICD-10-CM | POA: Diagnosis not present

## 2021-12-15 DIAGNOSIS — F4310 Post-traumatic stress disorder, unspecified: Secondary | ICD-10-CM | POA: Diagnosis not present

## 2021-12-15 DIAGNOSIS — F411 Generalized anxiety disorder: Secondary | ICD-10-CM
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
## 2021-12-16 NOTE — Plan of Care (Signed)
  Problem: Depression CCP Problem  1 Major depression Goal: Martha Hall will  Reduce frequency, intensity, and duration of depression symptoms as evidenced by development of x 3 effective coping skills within the next 12 weeks Outcome: Initial Goal: Martha Hall WILL IDENTIFY x 5 MALADAPTIVE COGNITIVE PATTERNS AND BELIEFS AND REFRAME DAILY(7/7 DAYS) Outcome: Initial Goal: Martha Hall WILL PRACTICE BEHAVIORAL ACTIVATION SKILLS X5  TIMES PER WEEK FOR THE NEXT 12 WEEKS Outcome: Initial   Problem: Anxiety Disorder CCP Problem  1 GAD Goal: Martha Hall will Report a decrease in anxiety symptoms as evidenced by utilization of x 3 effective relaxation/grounding techniques with the next 12 weeks Outcome: Initial

## 2021-12-21 ENCOUNTER — Encounter: Payer: Self-pay | Admitting: Family Medicine

## 2021-12-21 ENCOUNTER — Ambulatory Visit: Payer: Self-pay | Admitting: *Deleted

## 2021-12-21 ENCOUNTER — Ambulatory Visit (INDEPENDENT_AMBULATORY_CARE_PROVIDER_SITE_OTHER): Payer: Medicaid Other | Admitting: Family Medicine

## 2021-12-21 VITALS — BP 114/77 | HR 62 | Temp 97.7°F | Resp 16 | Ht 60.0 in | Wt 228.2 lb

## 2021-12-21 DIAGNOSIS — F419 Anxiety disorder, unspecified: Secondary | ICD-10-CM

## 2021-12-21 DIAGNOSIS — R0789 Other chest pain: Secondary | ICD-10-CM

## 2021-12-21 DIAGNOSIS — J309 Allergic rhinitis, unspecified: Secondary | ICD-10-CM | POA: Diagnosis not present

## 2021-12-21 DIAGNOSIS — K219 Gastro-esophageal reflux disease without esophagitis: Secondary | ICD-10-CM

## 2021-12-21 DIAGNOSIS — Z6841 Body Mass Index (BMI) 40.0 and over, adult: Secondary | ICD-10-CM

## 2021-12-21 DIAGNOSIS — R079 Chest pain, unspecified: Secondary | ICD-10-CM

## 2021-12-21 DIAGNOSIS — F32A Depression, unspecified: Secondary | ICD-10-CM | POA: Diagnosis not present

## 2021-12-21 MED ORDER — OMEPRAZOLE 20 MG PO CPDR: 20.0000 mg | DELAYED_RELEASE_CAPSULE | Freq: Every day | ORAL | 1 refills | Status: DC

## 2021-12-21 MED ORDER — FLUTICASONE PROPIONATE 50 MCG/ACT NA SUSP: 2.0000 | Freq: Every day | NASAL | 1 refills | Status: DC

## 2021-12-21 NOTE — Telephone Encounter (Signed)
  Chief Complaint: Chest Pain Symptoms: Chest pain mid chest to left, radiates to shoulder. States has had for months off and on, saw cardiologist, seen in UC, ED.  Also reports mild SOB "I have to go outside." Headache, slight dizziness. Frequency: Friday Pertinent Negatives: Patient denies nausea, sweating. Disposition: [] ED /[] Urgent Care (no appt availability in office) / [x] Appointment(In office/virtual)/ []  [place] [facility]/ [] Home Care/ [] Refused Recommended Disposition /[] [facility] [facility]/ []  Follow-up with PCP Additional Notes: Appt secured for today. Care advise provided, worsening symptoms go to ED. Reason for Disposition  [1] Chest pain lasts > 5 minutes AND [2] occurred > 3 days ago (72 hours) AND [3] NO chest pain or cardiac symptoms now  Answer Assessment - Initial Assessment Questions 1. LOCATION: "Where does it hurt?"       center 2. RADIATION: "Does the pain go anywhere else?" (e.g., into neck, jaw, arms, back)     Radiates to left chest and left shoulder and arm 3. ONSET: "When did the chest pain begin?" (Minutes, hours or days)      Friday 4. PATTERN: "Does the pain come and go, or has it been constant since it started?"  "Does it get worse with exertion?"      Constant, worse at night 5. DURATION: "How long does it last" (e.g., seconds, minutes, hours)     Constant 6. SEVERITY: "How bad is the pain?"  (e.g., Scale 1-10; mild, moderate, or severe)    - MILD (1-3): doesn't interfere with normal activities     - MODERATE (4-7): interferes with normal activities or awakens from sleep    - SEVERE (8-10): excruciating pain, unable to do any normal activities       7/10 7. CARDIAC RISK FACTORS: "Do you have any history of heart problems or risk factors for heart disease?" (e.g., angina, prior heart attack; diabetes, high blood pressure, high cholesterol, smoker, or strong family history of heart disease)      8. PULMONARY RISK FACTORS: "Do you have any history  of lung disease?"  (e.g., blood clots in lung, asthma, emphysema, birth control pills)      9. CAUSE: "What do you think is causing the chest pain?"      10. OTHER SYMPTOMS: "Do you have any other symptoms?" (e.g., dizziness, nausea, vomiting, sweating, fever, difficulty breathing, cough)       SOB all day, at rest,dizziness yesterday, little this AM. Constant headache.  Protocols used: Chest Pain-A-AH

## 2021-12-21 NOTE — Progress Notes (Signed)
Patient came in with c/o chest pain x 3 months. Patient has been seen at [facility], and cardiologist . Patient said that she has SOB, PTSD, and the pain is in the center of her chest, back,and shoulder.

## 2021-12-26 ENCOUNTER — Encounter: Payer: Self-pay | Admitting: Family Medicine
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
## 2021-12-30 ENCOUNTER — Telehealth (HOSPITAL_COMMUNITY): Payer: Self-pay | Admitting: *Deleted

## 2021-12-30 ENCOUNTER — Ambulatory Visit (INDEPENDENT_AMBULATORY_CARE_PROVIDER_SITE_OTHER): Payer: Self-pay | Admitting: Mental Health

## 2021-12-30 DIAGNOSIS — F4310 Post-traumatic stress disorder, unspecified: Secondary | ICD-10-CM

## 2021-12-30 DIAGNOSIS — F411 Generalized anxiety disorder: Secondary | ICD-10-CM
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
## 2021-12-30 NOTE — Telephone Encounter (Signed)
Patient Called & "stated that she has been taking prescribed med for  2 weeks now  & she's not sleeping. traZODone (DESYREL) 100 MG tablet Take 1 tablet (100 mg total) by mouth at bedtime as needed for sleep

## 2022-01-02 ENCOUNTER — Telehealth (HOSPITAL_COMMUNITY): Payer: Self-pay | Admitting: Psychiatry

## 2022-01-02 ENCOUNTER — Encounter (HOSPITAL_COMMUNITY): Payer: Self-pay | Admitting: Psychiatry

## 2022-01-02 ENCOUNTER — Other Ambulatory Visit (HOSPITAL_COMMUNITY): Payer: Self-pay | Admitting: Psychiatry

## 2022-01-02 MED ORDER — QUETIAPINE FUMARATE 25 MG PO TABS: 25.0000 mg | ORAL_TABLET | Freq: Every day | ORAL | 3 refills | Status: DC

## 2022-01-02 NOTE — Telephone Encounter (Signed)
Patient called Clinical research associate and reports that when she first began trazodone she sleeps through the night however reports on day 3 she woke up every 2 hours.  She notes that her dreaming/nightmares intensified.  She notes that she no longer has nightmares every day but instead 3 times a week however notes that they are worse.  Provider informed patient that trazodone can sometimes cause dreaming.  Patient requested to discontinue trazodone.  Today she is agreeable to starting Seroquel 25 mg nightly to help manage anxiety, depression, and sleep.  She will continue her other medications prescribed.  No other concerns noted at this time.

## 2022-01-02 NOTE — Telephone Encounter (Signed)
Provider attempted to call the patient 4 times on 12/30/2021.  The front desk staff also attempted to call patient twice.  Provider again call patient twice on 01/02/2022 without success.  Voicemail left for patient to call to walk into the clinic if sleep continues to be problematic.

## 2022-01-04 ENCOUNTER — Encounter (HOSPITAL_COMMUNITY): Payer: Self-pay

## 2022-01-04 ENCOUNTER — Emergency Department (HOSPITAL_COMMUNITY)
Admission: EM | Admit: 2022-01-04 | Discharge: 2022-01-04 | Disposition: A | Discharge status: Home / Self Care | Payer: Medicaid Other | Attending: Emergency Medicine | Admitting: Emergency Medicine

## 2022-01-04 ENCOUNTER — Other Ambulatory Visit: Payer: Self-pay

## 2022-01-04 ENCOUNTER — Emergency Department (HOSPITAL_COMMUNITY): Payer: Medicaid Other

## 2022-01-04 DIAGNOSIS — R519 Headache, unspecified: Secondary | ICD-10-CM | POA: Insufficient documentation

## 2022-01-04 DIAGNOSIS — H538 Other visual disturbances: Secondary | ICD-10-CM | POA: Diagnosis not present

## 2022-01-04 LAB — I-STAT BETA HCG BLOOD, ED (MC, WL, AP ONLY): I-stat hCG, quantitative: <5 m[IU]/mL (ref <5)

## 2022-01-04 MED ORDER — PROCHLORPERAZINE EDISYLATE 10 MG/2ML IJ SOLN
10.0000 mg | Freq: Once | INTRAMUSCULAR | Status: AC
  Administered 2022-01-04: 10 mg via INTRAVENOUS
  Filled 2022-01-04: qty 2

## 2022-01-04 MED ORDER — KETOROLAC TROMETHAMINE 15 MG/ML IJ SOLN
15.0000 mg | Freq: Once | INTRAMUSCULAR | Status: AC
  Administered 2022-01-04: 15 mg via INTRAVENOUS
  Filled 2022-01-04: qty 1

## 2022-01-04 MED ORDER — DIPHENHYDRAMINE HCL 50 MG/ML IJ SOLN
12.5000 mg | Freq: Once | INTRAMUSCULAR | Status: AC
  Administered 2022-01-04: 12.5 mg via INTRAVENOUS
  Filled 2022-01-04: qty 1

## 2022-01-04 NOTE — Discharge Instructions (Signed)
It was a pleasure taking care of you today.  As discussed, your CT scan was normal.  I have placed a referral to neurology.  Expect a phone call within the next week to schedule an appointment.  Return to the ER for new or worsening symptoms.

## 2022-01-04 NOTE — ED Triage Notes (Signed)
Pt arrived via POV, c/o migraine x1 month.

## 2022-01-04 NOTE — ED Provider Triage Note (Signed)
Emergency Medicine Provider Triage Evaluation Note  Martha Hall , a 26 y.o. female  was evaluated in triage.  Pt complains of persistent headache x1 month.  Patient admits to blurry vision yesterday.  Patient notes she was diagnosed with migraines in January.  Patient states this feels worse than her typical migraines.  No fever.  No neck stiffness.  Review of Systems  Positive: headache Negative: fever  Physical Exam  BP 130/80 (BP Location: Left Arm)   Pulse 77   Temp 97.6 F (36.4 C) (Oral)   Resp 18   SpO2 97%  Gen:   Awake, no distress   Resp:  Normal effort  MSK:   Moves extremities without difficulty  Other:  AAOx4.   Medical Decision Making  Medically screening exam initiated at 7:03 PM.  Appropriate orders placed.  Martha Hall was informed that the remainder of the evaluation will be completed by another provider, this initial triage assessment does not replace that evaluation, and the importance of remaining in the ED until their evaluation is complete.  CT head   Jesusita Oka 01/04/22 1905

## 2022-01-04 NOTE — ED Provider Notes (Signed)
[facility]-EMERGENCY DEPT Provider Note   CSN: [id number] Arrival date & time: 01/04/22  1839     History  Chief Complaint  Patient presents with   Headache    Martha Hall is a 26 y.o. female with a past medical history significant for anxiety, PTSD, and prediabetes who presents to the ED due to bilateral headache x1 month.  Patient states headache has been constant for the past month.  Admits to some blurry vision yesterday which has resolved.  She has a history of migraines however, notes this feels slightly worse.  No dizziness.  Denies fever and chills.  No stiff neck.  No speech changes or unilateral weakness.  No recent head injury.  She is not currently on any blood thinners.  History obtained from patient and past medical records. No interpreter used during encounter.       Home Medications Prior to Admission medications   Medication Sig Start Date End Date Taking? Authorizing Provider  doxycycline (VIBRAMYCIN) 100 MG capsule Take 1 capsule (100 mg total) by mouth 2 (two) times daily. 10/06/21   Mayers, Cari S, PA-C  famotidine (PEPCID) 20 MG tablet Take 1 tablet (20 mg total) by mouth 2 (two) times daily. 01/21/21   Wallis Bamberg, PA-C  fluticasone (FLONASE) 50 MCG/ACT nasal spray Place 2 sprays into both nostrils daily. 12/21/21   Georganna Skeans, MD  gabapentin (NEURONTIN) 300 MG capsule Take 1 capsule (300 mg total) by mouth 3 (three) times daily. 12/14/21   Shanna Cisco, NP  omeprazole (PRILOSEC) 20 MG capsule Take 1 capsule (20 mg total) by mouth daily. 12/21/21   Georganna Skeans, MD  prazosin (MINIPRESS) 1 MG capsule Take 1 capsule (1 mg total) by mouth at bedtime. 12/14/21   Shanna Cisco, NP  QUEtiapine (SEROQUEL) 25 MG tablet Take 1 tablet (25 mg total) by mouth at bedtime. 01/02/22   Shanna Cisco, NP  sertraline (ZOLOFT) 25 MG tablet Take 1 tablet (25 mg total) by mouth daily. 12/14/21   Shanna Cisco, NP  SUMAtriptan (IMITREX) 25  MG tablet Take 25 mg (1 tablet total) by mouth at the start of the headache. May repeat in 2 hours x 1 if headache persists. Max of 2 tablets/24 hours. 06/06/21   Rema Fendt, NP  tiZANidine (ZANAFLEX) 4 MG tablet Take 1 tablet (4 mg total) by mouth every 8 (eight) hours as needed for muscle spasms. 12/19/20   Rhys Martini, PA-C  diphenhydrAMINE (BENADRYL) 25 MG tablet Take 12.5 mg by mouth every 6 (six) hours as needed for allergies. Patient not taking: Reported on 12/30/2019  06/21/20  [provider]      Allergies    Metoclopramide    Review of Systems   Review of Systems  Constitutional:  Negative for chills and fever.  Eyes:  Positive for visual disturbance.  Musculoskeletal:  Negative for neck pain and neck stiffness.  Neurological:  Positive for headaches. Negative for dizziness, facial asymmetry, speech difficulty, weakness and numbness.  All other systems reviewed and are negative.   Physical Exam Updated Vital Signs BP 130/80 (BP Location: Left Arm)   Pulse 77   Temp 97.6 F (36.4 C) (Oral)   Resp 18   SpO2 97%  Physical Exam Vitals and nursing note reviewed.  Constitutional:      General: She is not in acute distress.    Appearance: She is not ill-appearing.  HENT:     Head: Normocephalic.  Eyes:  Pupils: Pupils are equal, round, and reactive to light.  Neck:     Comments: No meningismus. Cardiovascular:     Rate and Rhythm: Normal rate and regular rhythm.     Pulses: Normal pulses.     Heart sounds: Normal heart sounds. No murmur heard.    No friction rub. No gallop.  Pulmonary:     Effort: Pulmonary effort is normal.     Breath sounds: Normal breath sounds.  Abdominal:     General: Abdomen is flat. There is no distension.     Palpations: Abdomen is soft.     Tenderness: There is no abdominal tenderness. There is no guarding or rebound.  Musculoskeletal:        General: Normal range of motion.     Cervical back: Neck supple.  Skin:     General: Skin is warm and dry.  Neurological:     General: No focal deficit present.     Mental Status: She is alert.  Psychiatric:        Mood and Affect: Mood normal.        Behavior: Behavior normal.     Comments: Speech is clear, able to follow commands CN III-XII intact Normal strength in upper and lower extremities bilaterally including dorsiflexion and plantar flexion, strong and equal grip strength Sensation grossly intact throughout Moves extremities without ataxia, coordination intact No pronator drift Ambulates without difficulty     ED Results / Procedures / Treatments   Labs (all labs ordered are listed, but only abnormal results are displayed) Labs Reviewed  I-STAT BETA HCG BLOOD, ED ([facility], [facility], [facility] ONLY)    EKG None  Radiology CT Head Wo Contrast  Result Date: 01/04/2022 CLINICAL DATA:  Migraine headaches for 1 month increased over the past few hours. EXAM: CT HEAD WITHOUT CONTRAST TECHNIQUE: Contiguous axial images were obtained from the base of the skull through the vertex without intravenous contrast. RADIATION DOSE REDUCTION: This exam was performed according to the departmental dose-optimization program which includes automated exposure control, adjustment of the mA and/or kV according to patient size and/or use of iterative reconstruction technique. COMPARISON:  None Available. FINDINGS: Brain: No evidence of acute infarction, hemorrhage, hydrocephalus, extra-axial collection or mass lesion/mass effect. Right-sided ventricular shunt catheter is noted in satisfactory position. Ventricles are decompressed. Vascular: No hyperdense vessel or unexpected calcification. Skull: Normal. Negative for fracture or focal lesion. Sinuses/Orbits: No acute finding. Other: None. IMPRESSION: No acute abnormality noted. Electronically Signed   By: Alcide Clever M.D.   On: 01/04/2022 19:36    Procedures Procedures    Medications Ordered in ED Medications  prochlorperazine  (COMPAZINE) injection 10 mg (10 mg Intravenous Given 01/04/22 2003)  diphenhydrAMINE (BENADRYL) injection 12.5 mg (12.5 mg Intravenous Given 01/04/22 2000)  ketorolac (TORADOL) 15 MG/ML injection 15 mg (15 mg Intravenous Given 01/04/22 2001)    ED Course/ Medical Decision Making/ A&P                           Medical Decision Making Amount and/or Complexity of Data Reviewed Labs: ordered. Decision-making details documented in ED Course. Radiology: ordered and independent interpretation performed. Decision-making details documented in ED Course.  Risk Prescription drug management.   26 year old female presents to the ED due to persistent bilateral headache x1 month.  History of migraines.  She endorses some blurry vision yesterday which has completely resolved.  No speech changes or unilateral weakness.  Upon arrival, vitals all within  normal limits.  Patient in no acute distress.  Benign physical exam.  Normal neurological exam without any deficits.  Low suspicion for CVA. No meningismus  or infectious symptoms to suggest meningitis. CT head to rule out emergent etiologies of headache.  Will treat with migraine cocktail.  CT head personally reviewed and interpreted which is negative for any abnormalities.  Migraine cocktail given.  8:27 PM Reassessed patient at bedside who notes resolution in headache. Patient notes she is ready to go home.  Ambulatory referral for neurology placed.  Advised patient follow-up with neurology and PCP if symptoms not improved. Strict ED precautions discussed with patient. Patient states understanding and agrees to plan. Patient discharged home in no acute distress and stable vitals        Final Clinical Impression(s) / ED Diagnoses Final diagnoses:  Acute nonintractable headache, unspecified headache type    Rx / DC Orders ED Discharge Orders          Ordered    Ambulatory referral to Neurology       Comments: An appointment is requested in  approximately: 2 weeks   01/04/22 2028              Jesusita Oka 01/04/22 2030    Lonell Grandchild, MD 01/05/22 [phone]

## 2022-01-05 ENCOUNTER — Ambulatory Visit (INDEPENDENT_AMBULATORY_CARE_PROVIDER_SITE_OTHER): Payer: Medicaid Other | Admitting: Mental Health

## 2022-01-05 ENCOUNTER — Other Ambulatory Visit (HOSPITAL_COMMUNITY): Payer: Self-pay | Admitting: Psychiatry

## 2022-01-05 DIAGNOSIS — F331 Major depressive disorder, recurrent, moderate: Secondary | ICD-10-CM

## 2022-01-05 DIAGNOSIS — F4310 Post-traumatic stress disorder, unspecified: Secondary | ICD-10-CM

## 2022-01-05 DIAGNOSIS — F411 Generalized anxiety disorder: Secondary | ICD-10-CM
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
## 2022-01-06 NOTE — Plan of Care (Signed)
  Problem: Depression CCP Problem  1 Major depression Goal: Kenniyah WILL IDENTIFY x 5 MALADAPTIVE COGNITIVE PATTERNS AND BELIEFS AND REFRAME DAILY(7/7 DAYS) Outcome: Progressing   Problem: Anxiety Disorder CCP Problem  1 GAD Goal: Demetrice will Report a decrease in anxiety symptoms as evidenced by utilization of x 3 effective relaxation/grounding techniques with the next 12 weeks Outcome: Progressing   

## 2022-01-12 ENCOUNTER — Other Ambulatory Visit: Payer: Self-pay | Admitting: Family Medicine

## 2022-01-24 ENCOUNTER — Other Ambulatory Visit (HOSPITAL_COMMUNITY): Payer: Self-pay | Admitting: Psychiatry

## 2022-01-24 ENCOUNTER — Other Ambulatory Visit: Payer: Self-pay | Admitting: Family Medicine

## 2022-02-02 ENCOUNTER — Ambulatory Visit (INDEPENDENT_AMBULATORY_CARE_PROVIDER_SITE_OTHER): Payer: Medicaid Other | Admitting: Mental Health

## 2022-02-02 DIAGNOSIS — F411 Generalized anxiety disorder: Secondary | ICD-10-CM

## 2022-02-02 DIAGNOSIS — F331 Major depressive disorder, recurrent, moderate: Secondary | ICD-10-CM

## 2022-02-02 NOTE — Plan of Care (Signed)
  Problem: Depression CCP Problem  1 Major depression Goal: Kynsli WILL IDENTIFY x 5 MALADAPTIVE COGNITIVE PATTERNS AND BELIEFS AND REFRAME DAILY(7/7 DAYS) Outcome: Progressing   Problem: Anxiety Disorder CCP Problem  1 GAD Goal: Demetrice will Report a decrease in anxiety symptoms as evidenced by utilization of x 3 effective relaxation/grounding techniques with the next 12 weeks Outcome: Progressing   

## 2022-02-02 NOTE — Progress Notes (Signed)
THERAPIST PROGRESS NOTE Virtual Visit via Video Note  I connected with Martha Hall on 02/02/22 at  4:30 PM EDT by a video enabled telemedicine application and verified that I am speaking with the correct person using two identifiers.  Location: Patient: home address on file  Provider: office    I discussed the limitations of evaluation and management by telemedicine and the availability of in person appointments. The patient expressed understanding and agreed to proceed.  I discussed the assessment and treatment plan with the patient. The patient was provided an opportunity to ask questions and all were answered. The patient agreed with the plan and demonstrated an understanding of the instructions.   The patient was advised to call back or seek an in-person evaluation if the symptoms worsen or if the condition fails to improve as anticipated.  I provided 45 minutes of non-face-to-face time during this encounter.   Marion Downer, [facility]   Session Time: 4:45pm (45 minutes   Participation Level: Active  Behavioral Response: CasualAlertEuthymic  Type of Therapy: Individual Therapy  Treatment Goals addressed: Martha Hall Report a decrease in anxiety symptoms as evidenced by utilization of x 3 effective relaxation/grounding techniques with the next 12 weeks  Martha Hall  Reduce frequency, intensity, and duration of depression symptoms as evidenced by development of x 3 effective coping skills within the next 12 weeks  ProgressTowards Goals: Progressing  Interventions: CBT and Supportive  Summary: Martha Hall is a 26 y.o. female who presents with MDD moderate and PTSD.  Martha Hall presents alert and oriented; mood and affect adequate. Speech clear and coherent at normal rate and tone. Engaged and receptive to interventions. Martha Hall shares to have returned from visit with cousin in [place] and shares to have enjoyed visit; reports no feelings of anxiety duration of  trip in which she had little worries or concerns to be anxious about. Shares to have received support with her son; was able to work uninterrupted and to have enjoyed spending time with family and outings. Shares now that she has returned to feel as if she is doing better however return of feelings of worry. Shares concerns about working and having to have son in separate room. Shares thoughts of moving to [place] for increased family support and feels she would do well there financially. Shares thoughts of disliking current environment with negative memories present in home. Shares has been able to follow up with journaling and shares concerning thoughts on abandonment concerns, trust, feelings of guilt and other concerns. Shares to have purchased a specific journal to engage in and has found use beneficial. Shares to be on waiting list for [facility] and for work environment to have improved with transfer to new supervisor in which she likes. Reports decrease in sxs and working to engage in coping skills and learning to explore alternative thoughts to reframe distorted thinking. States journaling and talking to best friend to be coping skills.   Suicidal/Homicidal: Nowithout intent/plan  Therapist Response: Therapist engaged Martha Hall in tele-therapy session. Completed check in and assessed for current level of functioning, sxs management and level of stressors Provided safe space for Martha Hall to share thoughts and feelings related to current stressors. Provided supportive encouragement; validated feelings. Engaged Martha Hall in exploring recent trip to [place] and factors that supported increase level of functioning and decreased feelings of anxiety and depression during visit. Explored ways to work to continue progress and explore factors that support in wellness in current environment and factors that contribute to decompensation.  Explored ability to set in place boundaries with others and self and working to foster  relationships in which she feels are beneficial for her. Explored feelings of guilt, abandonment and trust and supported in navigating thoughts and feelings. Affirmed ability to secure journal and working to incorporate at [employer] and coping skills to support in processing feelings in healthy manner. Reviewed session and scheduled follow up.   Plan: Return again in  x 2 weeks.  Diagnosis: Moderate episode of recurrent major depressive disorder (HCC)  GAD (generalized anxiety disorder)  Collaboration of Care: Other None  Patient/Guardian was advised Release of Information must be obtained prior to any record release in order to collaborate their care with an outside provider. Patient/Guardian was advised if they have not already done so to contact the registration department to sign all necessary forms in order for [place] to release information regarding their care.   Consent: Patient/Guardian gives verbal consent for treatment and assignment of benefits for services provided during this visit. Patient/Guardian expressed understanding and agreed to proceed.   Stephan Minister [place], [facility] 02/02/2022

## 2022-02-07 ENCOUNTER — Other Ambulatory Visit: Payer: Self-pay | Admitting: Family Medicine

## 2022-02-07 NOTE — Telephone Encounter (Signed)
  Notes to clinic:  Pharm requests as follows: Pharmacy comment: REQUEST FOR 90 DAYS PRESCRIPTION.      Requested Prescriptions  Pending Prescriptions Disp Refills   fluticasone (FLONASE) 50 MCG/ACT nasal spray [Pharmacy Med Name: FLUTICASONE PROP 50 MCG SPRAY] 48 mL 1    Sig: SPRAY 2 SPRAYS INTO EACH NOSTRIL EVERY DAY     Ear, Nose, and Throat: Nasal Preparations - Corticosteroids Passed - 02/07/2022 10:50 AM      Passed - Valid encounter within last 12 months    Recent Outpatient Visits           1 month ago Atypical chest pain   [facility] at [facility], MD   3 months ago Encounter for completion of form with patient   Primary Care at [facility], Amy J, NP   4 months ago GAD (generalized anxiety disorder)   Primary Care at [facility], Loraine Grip, PA-C   8 months ago Annual physical exam   Primary Care at [facility], Amy J, NP   11 months ago Encounter to establish care   Primary Care at [facility], Flonnie Hailstone, NP

## 2022-02-09 ENCOUNTER — Telehealth (HOSPITAL_COMMUNITY): Payer: Self-pay | Admitting: *Deleted

## 2022-02-09 NOTE — Telephone Encounter (Signed)
Quetiapine denied by [employer], stating didn't not meet medical necessity. First line meds are quetiapine furmate.

## 2022-02-17 ENCOUNTER — Ambulatory Visit (INDEPENDENT_AMBULATORY_CARE_PROVIDER_SITE_OTHER): Payer: Medicaid Other | Admitting: Mental Health

## 2022-02-17 DIAGNOSIS — F331 Major depressive disorder, recurrent, moderate: Secondary | ICD-10-CM | POA: Diagnosis not present

## 2022-02-17 DIAGNOSIS — F411 Generalized anxiety disorder: Secondary | ICD-10-CM
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
## 2022-02-17 NOTE — Plan of Care (Signed)
  Problem: Depression CCP Problem  1 Major depression Goal: Gabby WILL IDENTIFY x 5 MALADAPTIVE COGNITIVE PATTERNS AND BELIEFS AND REFRAME DAILY(7/7 DAYS) Outcome: Progressing   Problem: Anxiety Disorder CCP Problem  1 GAD Goal: Demetrice will Report a decrease in anxiety symptoms as evidenced by utilization of x 3 effective relaxation/grounding techniques with the next 12 weeks Outcome: Progressing

## 2022-03-09 ENCOUNTER — Ambulatory Visit (INDEPENDENT_AMBULATORY_CARE_PROVIDER_SITE_OTHER): Payer: Medicaid Other | Admitting: Psychiatry

## 2022-03-09 ENCOUNTER — Encounter (HOSPITAL_COMMUNITY): Payer: Self-pay | Admitting: Psychiatry

## 2022-03-09 DIAGNOSIS — F4310 Post-traumatic stress disorder, unspecified: Secondary | ICD-10-CM | POA: Diagnosis not present

## 2022-03-09 DIAGNOSIS — F331 Major depressive disorder, recurrent, moderate: Secondary | ICD-10-CM

## 2022-03-09 DIAGNOSIS — F411 Generalized anxiety disorder: Secondary | ICD-10-CM | POA: Diagnosis not present

## 2022-03-09 MED ORDER — GABAPENTIN 300 MG PO CAPS: 300.0000 mg | ORAL_CAPSULE | Freq: Three times a day (TID) | ORAL | 3 refills | Status: DC

## 2022-03-09 MED ORDER — QUETIAPINE FUMARATE 50 MG PO TABS: 50.0000 mg | ORAL_TABLET | Freq: Every day | ORAL | 3 refills | Status: DC

## 2022-03-09 MED ORDER — PRAZOSIN HCL 1 MG PO CAPS: 1.0000 mg | ORAL_CAPSULE | Freq: Every day | ORAL | 3 refills | Status: DC

## 2022-03-09 MED ORDER — SERTRALINE HCL 50 MG PO TABS: 50.0000 mg | ORAL_TABLET | Freq: Every day | ORAL | 3 refills | Status: DC

## 2022-03-09 NOTE — Progress Notes (Signed)
[place] MD/PA/NP OP Progress Note Virtual Visit via Telephone Note  I connected with Martha Hall on 03/09/22 at  8:30 AM EDT by telephone and verified that I am speaking with the correct person using two identifiers.  Location: Patient: home Provider: Clinic   I discussed the limitations, risks, security and privacy concerns of performing an evaluation and management service by telephone and the availability of in person appointments. I also discussed with the patient that there may be a patient responsible charge related to this service. The patient expressed understanding and agreed to proceed.   I provided 30 minutes of non-face-to-face time during this encounter.  03/09/2022 9:16 AM Martha Hall  MRN:  [id number]  Chief Complaint: "I have been irritable"  HPI: 26 year old female seen today for follow-up psychiatric evaluation.  She has a psychiatric history of depression, PTSD, and anxiety.  She is currently managed on Seroquel 25 mg nightly, Zoloft 25 mg daily, prazosin 1 mg nightly, gabapentin 300 mg 3 times daily.  She notes her medications are somewhat effective in managing her psychiatric conditions.    Today patient was unable to login virtually so her assessment was done on phone.  During exam she was pleasant, cooperative, engaged in conversation.  She informed Probation officer that she has been irritable.  She notes that she is uncertain why she is becoming so frustrated.  She notes that she continues to suffer from anxiety and depression.  Patient notes that she is concerned about her job at [employer], her 27-year-old son, finances, and moving.  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  Today provider conducted a GAD-7 and patient scored an 18.  Provider also conducted PHQ-9 patient scored 22.  She notes that her sleep and appetite are poor.  Patient notes that she sleeps approximately 4 hours nightly.  She notes that her  appetite is somewhat reduced and reports that she is lost 10 pounds since her last visit.  Today she denies SI/HI/AVH.  Patient does note that she feels paranoid at times due to past trauma.  She notes that when she is in public at times she walks fast to avoid someone hurting her.  Patient notes at night she has racing thoughts and reports that she is distracted most days.  At times she notes that she is impulsive.  Today she is agreeable to increase Seroquel 25 mg to 50 mg to help manage irritability, anxiety, depression, and sleep.  She is also agreeable to increasing Zoloft 25 mg to 50 mg to help manage anxiety and depression.  She will continue her other medications as prescribed.  No other concerns noted at this time.  Visit Diagnosis:    ICD-10-CM   1. Moderate episode of recurrent major depressive disorder (HCC)  F33.1 gabapentin (NEURONTIN) 300 MG capsule    QUEtiapine (SEROQUEL) 50 MG tablet    sertraline (ZOLOFT) 50 MG tablet    2. PTSD (post-traumatic stress disorder)  F43.10 prazosin (MINIPRESS) 1 MG capsule    sertraline (ZOLOFT) 50 MG tablet    3. 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 Per consultation with Dr. Rosana Hoes, no further work-up necessary. Per MFM: " The patient was reassured that the anti-Lewis antibodies are usually of the IgM subtype and therefore we will not cross the placenta and cause fetal anemia.  The Lewis antibodies should therefore not affect the management of her current pregnancy."   Vaginal trichomoniasis 05/09/2019    04/30/2019 on Pap [] tx [] TOC- neg    Past Surgical History:  Procedure Laterality Date   BRAIN SURGERY  [birth year]   stint placed when born   CHOLECYSTECTOMY N/A 01/25/2021   Procedure: LAPAROSCOPIC CHOLECYSTECTOMY WITH ATTEMPTED CHOLANGIOGRAM;  Surgeon: Coralie Keens, MD;  Location: [facility];  Service: General;  Laterality: N/A;   ERCP N/A 01/29/2021   Procedure: ENDOSCOPIC RETROGRADE CHOLANGIOPANCREATOGRAPHY (ERCP);  Surgeon: Gatha Mayer, MD;  Location: [facility] ENDOSCOPY;  Service: Endoscopy;  Laterality: N/A;   IR RADIOLOGIST EVAL & MGMT  03/23/2021   REMOVAL OF STONES  01/29/2021   Procedure: REMOVAL OF STONES;  Surgeon: Gatha Mayer, MD;  Location: [facility] ENDOSCOPY;  Service: Endoscopy;;   SPHINCTEROTOMY  01/29/2021   Procedure: Joan Mayans;  Surgeon: Gatha Mayer, MD;  Location: [facility] ENDOSCOPY;  Service: Endoscopy;;    Family Psychiatric History: Maternal grandmother schizophrenia and brother anxiety and depression   Family History:  Family History  Problem Relation Age of Onset   Healthy Mother    Healthy Father     Social History:  Social History   Socioeconomic History   Marital status: Single    Spouse name: Not on file   Number of children: Not on file   Years of education: Not on file   Highest education level: Associate degree: occupational, Hotel manager, or vocational program  Occupational History   Occupation: Customer Service    Comment: [place]  Tobacco Use   Smoking status: Never   Smokeless tobacco: Never  Vaping Use   Vaping Use: Never used  Substance and Sexual Activity   Alcohol use: Never   Drug use: Never   Sexual activity: Not Currently    Comment: undecided  Other Topics Concern   Not on file  Social History Narrative   Not on file   Social Determinants of Health   Financial Resource Strain: High Risk (12/15/2021)   Overall Financial Resource Strain (CARDIA)    Difficulty of Paying Living Expenses: Hard  Food Insecurity: No Food Insecurity  (12/15/2021)   Hunger Vital Sign    Worried About Running Out of Food in the Last Year: Never true    Ran Out of Food in the Last Year: Never true  Transportation Needs: Unmet Transportation Needs (12/15/2021)   PRAPARE - Transportation    Lack of Transportation (Medical): Yes    Lack of Transportation (Non-Medical): Yes  Physical Activity: Inactive (12/15/2021)   Exercise Vital Sign    Days of Exercise per Week: 0 days    Minutes of Exercise per Session: 0 min  Stress: Stress Concern Present (12/15/2021)   [place]    Feeling of Stress : Very much  Social Connections: Socially Isolated (12/15/2021)   Social Connection and Isolation Panel [NHANES]    Frequency of Communication with Friends and Family: More than three times a week    Frequency of Social Gatherings with Friends and Family: Never    Attends Religious Services: Never    Active Member  of Clubs or Organizations: No    Attends Archivist Meetings: Never    Marital Status: Never married    Allergies:  Allergies  Allergen Reactions   Metoclopramide Hives and Itching    Metabolic Disorder Labs: Lab Results  Component Value Date   HGBA1C 5.9 (H) 06/06/2021   No results found for: "PROLACTIN" Lab Results  Component Value Date   CHOL 132 06/06/2021   TRIG 97 06/06/2021   HDL 53 06/06/2021   CHOLHDL 2.5 06/06/2021   LDLCALC 61 06/06/2021   Lab Results  Component Value Date   TSH 1.350 06/06/2021    Therapeutic Level Labs: No results found for: "LITHIUM" No results found for: "VALPROATE" No results found for: "CBMZ"  Current Medications: Current Outpatient Medications  Medication Sig Dispense Refill   doxycycline (VIBRAMYCIN) 100 MG capsule Take 1 capsule (100 mg total) by mouth 2 (two) times daily. 20 capsule 0   famotidine (PEPCID) 20 MG tablet Take 1 tablet (20 mg total) by mouth 2 (two) times daily. 30 tablet 0   fluticasone  (FLONASE) 50 MCG/ACT nasal spray Place 2 sprays into both nostrils daily. 48 mL 1   gabapentin (NEURONTIN) 300 MG capsule Take 1 capsule (300 mg total) by mouth 3 (three) times daily. 90 capsule 3   omeprazole (PRILOSEC) 20 MG capsule TAKE 1 CAPSULE BY MOUTH EVERY DAY 90 capsule 0   prazosin (MINIPRESS) 1 MG capsule Take 1 capsule (1 mg total) by mouth at bedtime. 30 capsule 3   QUEtiapine (SEROQUEL) 50 MG tablet Take 1 tablet (50 mg total) by mouth daily. 30 tablet 3   sertraline (ZOLOFT) 50 MG tablet Take 1 tablet (50 mg total) by mouth daily. 30 tablet 3   SUMAtriptan (IMITREX) 25 MG tablet Take 25 mg (1 tablet total) by mouth at the start of the headache. May repeat in 2 hours x 1 if headache persists. Max of 2 tablets/24 hours. 30 tablet 0   tiZANidine (ZANAFLEX) 4 MG tablet Take 1 tablet (4 mg total) by mouth every 8 (eight) hours as needed for muscle spasms. 30 tablet 0   No current facility-administered medications for this visit.     Musculoskeletal: Strength & Muscle Tone:  Unable to assess due to telephone visit Gait & Station:  Unable to assess due to telephone visit Patient leans: N/A  Psychiatric Specialty Exam: Review of Systems  There were no vitals taken for this visit.There is no height or weight on file to calculate BMI.  General Appearance:  Unable to assess due to telephone visit  Eye Contact:   Unable to assess due to telephone visit  Speech:  Clear and Coherent and Normal Rate  Volume:  Normal  Mood:  Anxious and Depressed  Affect:  Appropriate and Congruent  Thought Process:  Coherent, Goal Directed, and Linear  Orientation:  Full (Time, Place, and Person)  Thought Content: Logical and Paranoid Ideation   Suicidal Thoughts:  No  Homicidal Thoughts:  No  Memory:  Immediate;   Good Recent;   Good Remote;   Good  Judgement:  Good  Insight:  Good  Psychomotor Activity:  Normal  Concentration:  Concentration: Fair and Attention Span: Fair  Recall:  Good   Fund of Knowledge: Good  Language: Good  Akathisia:   Unable to assess due to telephone visit  Handed:  Right  AIMS (if indicated): not done  Assets:  Communication Skills Desire for Improvement Financial Resources/Insurance Housing Physical Health Social Support  ADL's:  Intact  Cognition: WNL  Sleep:  Fair   Screenings: GAD-7    Flowsheet Row Clinical Support from 03/09/2022 in [facility] Office Visit from 12/15/2021 in [facility] Office Visit from 12/14/2021 in [facility] Office Visit from 10/05/2021 in Primary Care at [facility]  Total GAD-7 Score 18 20 20 8       PHQ2-9    Flowsheet Row Clinical Support from 03/09/2022 in [facility] Office Visit from 12/21/2021 in Primary Care at [facility] Visit from 12/15/2021 in [facility] Office Visit from 12/14/2021 in [facility] Office Visit from 10/05/2021 in Primary Care at [facility] Total Score 6 6 6 6 2   PHQ-9 Total Score 22 18 20 22 7       Flowsheet Row ED from 01/04/2022 in [place] DEPT Office Visit from 12/15/2021 in [facility] Office Visit from 12/14/2021 in [place] No Risk No Risk Error: Q7 should not be populated when Q6 is No        Assessment and Plan: Patient endorses anxiety, depression, poor sleep, and paranoia (due to past trauma).Today she is agreeable to increase Seroquel 25 mg to 50 mg to help manage irritability, anxiety, depression, and sleep.  She is also agreeable to increasing Zoloft 25 mg to 50 mg to help manage anxiety and depression.  She will continue her other medications as prescribed.   1. Moderate episode of recurrent major depressive disorder (HCC)  Continue- gabapentin (NEURONTIN) 300 MG  capsule; Take 1 capsule (300 mg total) by mouth 3 (three) times daily.  Dispense: 90 capsule; Refill: 3 Increased- sertraline (ZOLOFT) 50 MG tablet; Take 1 tablet (50 mg total) by mouth daily.  Dispense: 30 tablet; Refill: 3 Increased-QUEtiapine (SEROQUEL) 50 MG tablet; Take 1 tablet (50 mg total) by mouth daily.  Dispense: 30 tablet; Refill: 3  2. PTSD (post-traumatic stress disorder)  Continue- prazosin (MINIPRESS) 1 MG capsule; Take 1 capsule (1 mg total) by mouth at bedtime.  Dispense: 30 capsule; Refill: 3 Increased- sertraline (ZOLOFT) 50 MG tablet; Take 1 tablet (50 mg total) by mouth daily.  Dispense: 30 tablet; Refill: 3  3. GAD (generalized anxiety disorder)  Continue- sertraline (ZOLOFT) 50 MG tablet; Take 1 tablet (50 mg total) by mouth daily.  Dispense: 30 tablet; Refill: 3 Increased- QUEtiapine (SEROQUEL) 50 MG tablet; Take 1 tablet (50 mg total) by mouth daily.  Dispense: 30 tablet; Refill: 3    Collaboration of Care: Collaboration of Care: Other provider involved in patient's care AEB counselor  Patient/Guardian was advised Release of Information must be obtained prior to any record release in order to collaborate their care with an outside provider. Patient/Guardian was advised if they have not already done so to contact the registration department to sign all necessary forms in order for [place] to release information regarding their care.   Consent: Patient/Guardian gives verbal consent for treatment and assignment of benefits for services provided during this visit. Patient/Guardian expressed understanding and agreed to proceed.   Follow-up in 3 months Follow-up with therapy  Salley Slaughter, NP 03/09/2022, 9:16 AM

## 2022-03-12 ENCOUNTER — Emergency Department (HOSPITAL_COMMUNITY)
Admission: EM | Admit: 2022-03-12 | Discharge: 2022-03-12 | Disposition: A | Discharge status: Home / Self Care | Payer: Medicaid Other | Attending: Emergency Medicine | Admitting: Emergency Medicine

## 2022-03-12 ENCOUNTER — Other Ambulatory Visit: Payer: Self-pay

## 2022-03-12 ENCOUNTER — Encounter (HOSPITAL_COMMUNITY): Payer: Self-pay | Admitting: Emergency Medicine

## 2022-03-12 DIAGNOSIS — R519 Headache, unspecified: Secondary | ICD-10-CM | POA: Diagnosis present

## 2022-03-12 DIAGNOSIS — Z20822 Contact with and (suspected) exposure to covid-19: Secondary | ICD-10-CM | POA: Diagnosis not present

## 2022-03-12 DIAGNOSIS — B9789 Other viral agents as the cause of diseases classified elsewhere: Secondary | ICD-10-CM | POA: Insufficient documentation

## 2022-03-12 DIAGNOSIS — J069 Acute upper respiratory infection, unspecified: Secondary | ICD-10-CM | POA: Diagnosis not present

## 2022-03-12 LAB — RESP PANEL BY RT-PCR (FLU A&B, COVID) ARPGX2
Influenza A by PCR: NEGATIVE
Influenza B by PCR: NEGATIVE
SARS Coronavirus 2 by RT PCR: NEGATIVE

## 2022-03-12 MED ORDER — PREDNISONE 10 MG PO TABS: 40.0000 mg | ORAL_TABLET | Freq: Every day | ORAL | 0 refills | Status: AC

## 2022-03-12 NOTE — Discharge Instructions (Addendum)
Prednisone as prescribed and complete the full course. Recommend saline sinus rinse twice daily.  Coolmist humidifier in your room at night. Use Motrin and Tylenol as needed as directed for headache and face pain.  Discontinue Tylenol PM.  Use melatonin if needed for sleeping.  Can continue with Zyrtec (discontinue benadryl due to nose bleeds).  Recheck with your doctor if not improving after prednisone.

## 2022-03-12 NOTE — ED Provider Notes (Signed)
[facility] DEPT Provider Note   CSN: [id number] Arrival date & time: 03/12/22  1859     History  Chief Complaint  Patient presents with   Headache    Martha Hall is a 26 y.o. female.  26 yo female presents with complaint of face pressure and migraine (head pressure), sore throat onset 1 week ago without preceding illness, onset after a family event. Home COVID negative, has been managing at home with generic zyrtec/Claritin, benadryl, tylenol PM, vaporizer to room with medicated solution in the vaporizer, migraine patch and migraine medication without resolution of her symptoms. Reports congestion is worse first thing in the morning, feels like area over nose/lower eyes is swollen, occasional nose bleeds. Denies fevers, chills, eye drainage. Son with URI symptoms. Had sneezing but resolved with the meds above. Sore throat also resolved.   LMP 2 weeks ago (02/26/22)       Home Medications Prior to Admission medications   Medication Sig Start Date End Date Taking? Authorizing Provider  gabapentin (NEURONTIN) 300 MG capsule Take 1 capsule (300 mg total) by mouth 3 (three) times daily. 03/09/22  Yes Eulis Canner E, NP  prazosin (MINIPRESS) 1 MG capsule Take 1 capsule (1 mg total) by mouth at bedtime. 03/09/22  Yes Eulis Canner E, NP  predniSONE (DELTASONE) 10 MG tablet Take 4 tablets (40 mg total) by mouth daily for 5 days. 03/12/22 03/17/22 Yes Tacy Learn, PA-C  QUEtiapine (SEROQUEL) 50 MG tablet Take 1 tablet (50 mg total) by mouth daily. 03/09/22  Yes Eulis Canner E, NP  sertraline (ZOLOFT) 50 MG tablet Take 1 tablet (50 mg total) by mouth daily. 03/09/22  Yes Eulis Canner E, NP  SUMAtriptan (IMITREX) 25 MG tablet Take 25 mg (1 tablet total) by mouth at the start of the headache. May repeat in 2 hours x 1 if headache persists. Max of 2 tablets/24 hours. Patient taking differently: Take 25 mg by mouth every 2 (two) hours as needed  for migraine. 06/06/21  Yes Minette Brine, Amy J, NP  doxycycline (VIBRAMYCIN) 100 MG capsule Take 1 capsule (100 mg total) by mouth 2 (two) times daily. Patient not taking: Reported on 03/12/2022 10/06/21   Mayers, Cari S, PA-C  famotidine (PEPCID) 20 MG tablet Take 1 tablet (20 mg total) by mouth 2 (two) times daily. Patient not taking: Reported on 03/12/2022 01/21/21   Jaynee Eagles, PA-C  fluticasone [facility]) 50 MCG/ACT nasal spray Place 2 sprays into both nostrils daily. Patient not taking: Reported on 03/12/2022 02/09/22 05/10/22  Dorna Mai, MD  omeprazole (PRILOSEC) 20 MG capsule TAKE 1 CAPSULE BY MOUTH EVERY DAY Patient not taking: Reported on 03/12/2022 01/12/22   Camillia Herter, NP  tiZANidine (ZANAFLEX) 4 MG tablet Take 1 tablet (4 mg total) by mouth every 8 (eight) hours as needed for muscle spasms. Patient not taking: Reported on 03/12/2022 12/19/20   Hazel Sams, PA-C  diphenhydrAMINE (BENADRYL) 25 MG tablet Take 12.5 mg by mouth every 6 (six) hours as needed for allergies. Patient not taking: Reported on 12/30/2019  06/21/20  [provider]      Allergies    Metoclopramide    Review of Systems   Review of Systems Negative except as per HPI Physical Exam Updated Vital Signs BP 120/76 (BP Location: Left Arm)   Pulse 82   Temp 98.1 F (36.7 C) (Oral)   Resp 18   Ht 5' (1.524 m)   Wt 104 kg   SpO2 96%  BMI 44.78 kg/m  Physical Exam Vitals and nursing note reviewed.  Constitutional:      General: She is not in acute distress.    Appearance: She is well-developed. She is not diaphoretic.  HENT:     Head: Normocephalic and atraumatic.     Right Ear: Tympanic membrane and ear canal normal.     Left Ear: Tympanic membrane and ear canal normal.     Nose: Congestion present.     Right Turbinates: Swollen and pale.     Left Turbinates: Swollen and pale.     Mouth/Throat:     Mouth: Mucous membranes are moist.     Pharynx: No oropharyngeal exudate or posterior  oropharyngeal erythema.  Eyes:     Conjunctiva/sclera: Conjunctivae normal.     Pupils: Pupils are equal, round, and reactive to light.  Pulmonary:     Effort: Pulmonary effort is normal.  Musculoskeletal:     Cervical back: Neck supple.  Lymphadenopathy:     Cervical: No cervical adenopathy.  Skin:    General: Skin is warm and dry.     Findings: No erythema or rash.  Neurological:     Mental Status: She is alert and oriented to person, place, and time.  Psychiatric:        Behavior: Behavior normal.     ED Results / Procedures / Treatments   Labs (all labs ordered are listed, but only abnormal results are displayed) Labs Reviewed  RESP PANEL BY RT-PCR (FLU A&B, COVID) [id number]    EKG None  Radiology No results found.  Procedures Procedures    Medications Ordered in ED Medications - No data to display  ED Course/ Medical Decision Making/ A&P                           Medical Decision Making Risk Prescription drug management.   26 yo female with symptoms as above, onset 1 week ago. Nasal congestion on exam, no active nose bleed. Exam otherwise unremarkable, well appearing, non toxic in no distress. COVID  negative. Suspect viral vs allergic source. Does not meet criteria for bacterial sinusitis. Will try course of prednisone. Recommend dc benadryl which may be drying nasal membranes and causing nose bleeds. Recheck with PCP after completing prednisone if not improving.         Final Clinical Impression(s) / ED Diagnoses Final diagnoses:  Viral upper respiratory tract infection    Rx / DC Orders ED Discharge Orders          Ordered    predniSONE (DELTASONE) 10 MG tablet  Daily        03/12/22 2229              Jeannie Fend, PA-C 03/12/22 2240    Linwood Dibbles, MD 03/12/22 [phone]

## 2022-03-12 NOTE — ED Triage Notes (Signed)
Pt c/o headache since last Saturday with sore throat. Pt states that she took an at home covid test that was negative.

## 2022-03-12 NOTE — ED Provider Triage Note (Signed)
Emergency Medicine Provider Triage Evaluation Note  Treniya Chain , a 26 y.o. female  was evaluated in triage.  Pt complains of cold sxs. Report sinus headache, ear pain, congestion, and cold sxs x 1 week.  Neg home covid test.  No sob, sore throat cp. No fever  Review of Systems  Positive: As above Negative: As above  Physical Exam  BP (!) 136/94 (BP Location: Right Arm)   Pulse 89   Temp 98.1 F (36.7 C) (Oral)   Resp 18   Ht 5' (1.524 m)   Wt 104 kg   SpO2 100%   BMI 44.78 kg/m  Gen:   Awake, no distress   Resp:  Normal effort  MSK:   Moves extremities without difficulty  Other:    Medical Decision Making  Medically screening exam initiated at 7:50 PM.  Appropriate orders placed.  Sidni Narine was informed that the remainder of the evaluation will be completed by another provider, this initial triage assessment does not replace that evaluation, and the importance of remaining in the ED until their evaluation is complete.     Domenic Moras, PA-C 03/12/22 1951

## 2022-03-15 ENCOUNTER — Telehealth: Payer: Medicaid Other | Admitting: Physician Assistant

## 2022-03-15 DIAGNOSIS — J019 Acute sinusitis, unspecified: Secondary | ICD-10-CM | POA: Diagnosis not present

## 2022-03-15 DIAGNOSIS — B9689 Other specified bacterial agents as the cause of diseases classified elsewhere: Secondary | ICD-10-CM

## 2022-03-15 MED ORDER — DOXYCYCLINE HYCLATE 100 MG PO TABS: 100.0000 mg | ORAL_TABLET | Freq: Two times a day (BID) | ORAL | 0 refills | Status: DC

## 2022-03-15 MED ORDER — ALBUTEROL SULFATE HFA 108 (90 BASE) MCG/ACT IN AERS: 2.0000 | INHALATION_SPRAY | Freq: Four times a day (QID) | RESPIRATORY_TRACT | 0 refills | Status: DC | PRN

## 2022-03-15 NOTE — Addendum Note (Signed)
Addended by: Waldon Merl on: 03/15/2022 10:06 AM   Modules accepted: Orders

## 2022-03-15 NOTE — Progress Notes (Signed)
I have spent 5 minutes in review of e-visit questionnaire, review and updating patient chart, medical decision making and response to patient.   Dandy Lazaro Cody Naiya Corral, PA-C    

## 2022-03-15 NOTE — Progress Notes (Signed)
E-Visit for Sinus Problems  We are sorry that you are not feeling well.  Here is how we plan to help!  Based on what you have shared with me it looks like you have sinusitis.  Sinusitis is inflammation and infection in the sinus cavities of the head.  Based on your presentation I believe you most likely have Acute Bacterial Sinusitis.  This is an infection caused by bacteria and is treated with antibiotics. I have prescribed Doxycycline 100mg  by mouth twice a day for 10 days. You may use an oral decongestant such as Mucinex D or if you have glaucoma or high blood pressure use plain Mucinex. Saline nasal spray help and can safely be used as often as needed for congestion.  If you develop worsening sinus pain, fever or notice severe headache and vision changes, or if symptoms are not better after completion of antibiotic, please schedule an appointment with a health care provider.    Sinus infections are not as easily transmitted as other respiratory infection, however we still recommend that you avoid close contact with loved ones, especially the very young and elderly.  Remember to wash your hands thoroughly throughout the day as this is the number one way to prevent the spread of infection!  I see you mentioned some neck stiffness. While I do not think this is anything more serious, especially giving chronicity of symptoms and recent negative ER evaluation, if anything is worsening with this I want you to be re-evaluated in person.   Home Care: Only take medications as instructed by your medical team. Complete the entire course of an antibiotic. Do not take these medications with alcohol. A steam or ultrasonic humidifier can help congestion.  You can place a towel over your head and breathe in the steam from hot water coming from a faucet. Avoid close contacts especially the very young and the elderly. Cover your mouth when you cough or sneeze. Always remember to wash your hands.  Get Help Right  Away If: You develop worsening fever or sinus pain. You develop a severe head ache or visual changes. Your symptoms persist after you have completed your treatment plan.  Make sure you Understand these instructions. Will watch your condition. Will get help right away if you are not doing well or get worse.  Thank you for choosing an e-visit.  Your e-visit answers were reviewed by a board certified advanced clinical practitioner to complete your personal care plan. Depending upon the condition, your plan could have included both over the counter or prescription medications.  Please review your pharmacy choice. Make sure the pharmacy is open so you can pick up prescription now. If there is a problem, you may contact your provider through and have the prescription routed to another pharmacy.  Your safety is important to [employer]. If you have drug allergies check your prescription carefully.   For the next 24 hours you can use MyChart to ask questions about today's visit, request a non-urgent call back, or ask for a work or school excuse. You will get an email in the next two days asking about your experience. I hope that your e-visit has been valuable and will speed your recovery.

## 2022-03-23 ENCOUNTER — Telehealth: Payer: Medicaid Other | Admitting: Family Medicine

## 2022-03-23 DIAGNOSIS — R0789 Other chest pain: Secondary | ICD-10-CM

## 2022-03-23 NOTE — Progress Notes (Signed)
Martha Hall   Needs to be seen in person for worsening symptoms- including chest pain and breathing. Recent Viral and Bacterial treatment in the last 2 weeks.   Patient acknowledged agreement and understanding of the plan.

## 2022-04-02 ENCOUNTER — Encounter: Payer: Medicaid Other | Admitting: Nurse Practitioner

## 2022-04-02 DIAGNOSIS — R61 Generalized hyperhidrosis: Secondary | ICD-10-CM

## 2022-04-02 NOTE — Patient Instructions (Signed)
Corbin Robinette Haines, thank you for joining Claiborne Rigg, NP for today's virtual visit.  While this provider is not your primary care provider (PCP), if your PCP is located in our provider database this encounter information will be shared with them immediately following your visit.   A [facility] MyChart account gives you access to today's visit and all your visits, tests, and labs performed at [facility] " click here if you don't have a [facility] MyChart account or go to [email or web address]  Consent: (Patient) Martha Hall provided verbal consent for this virtual visit at the beginning of the encounter.  Current Medications:  Current Outpatient Medications:    albuterol (VENTOLIN HFA) 108 (90 Base) MCG/ACT inhaler, Inhale 2 puffs into the lungs every 6 (six) hours as needed for wheezing or shortness of breath., Disp: 8 g, Rfl: 0   doxycycline (VIBRA-TABS) 100 MG tablet, Take 1 tablet (100 mg total) by mouth 2 (two) times daily., Disp: 20 tablet, Rfl: 0   famotidine (PEPCID) 20 MG tablet, Take 1 tablet (20 mg total) by mouth 2 (two) times daily. (Patient not taking: Reported on 03/12/2022), Disp: 30 tablet, Rfl: 0   fluticasone (FLONASE) 50 MCG/ACT nasal spray, Place 2 sprays into both nostrils daily. (Patient not taking: Reported on 03/12/2022), Disp: 48 mL, Rfl: 1   gabapentin (NEURONTIN) 300 MG capsule, Take 1 capsule (300 mg total) by mouth 3 (three) times daily., Disp: 90 capsule, Rfl: 3   omeprazole (PRILOSEC) 20 MG capsule, TAKE 1 CAPSULE BY MOUTH EVERY DAY (Patient not taking: Reported on 03/12/2022), Disp: 90 capsule, Rfl: 0   prazosin (MINIPRESS) 1 MG capsule, Take 1 capsule (1 mg total) by mouth at bedtime., Disp: 30 capsule, Rfl: 3   QUEtiapine (SEROQUEL) 50 MG tablet, Take 1 tablet (50 mg total) by mouth daily., Disp: 30 tablet, Rfl: 3   sertraline (ZOLOFT) 50 MG tablet, Take 1 tablet (50 mg total) by mouth daily., Disp: 30 tablet, Rfl: 3   SUMAtriptan (IMITREX)  25 MG tablet, Take 25 mg (1 tablet total) by mouth at the start of the headache. May repeat in 2 hours x 1 if headache persists. Max of 2 tablets/24 hours. (Patient taking differently: Take 25 mg by mouth every 2 (two) hours as needed for migraine.), Disp: 30 tablet, Rfl: 0   tiZANidine (ZANAFLEX) 4 MG tablet, Take 1 tablet (4 mg total) by mouth every 8 (eight) hours as needed for muscle spasms. (Patient not taking: Reported on 03/12/2022), Disp: 30 tablet, Rfl: 0   Medications ordered in this encounter:  No orders of the defined types were placed in this encounter.    *If you need refills on other medications prior to your next appointment, please contact your pharmacy*  Follow-Up: Call back or seek an in-person evaluation if the symptoms worsen or if the condition fails to improve as anticipated.  [facility] [phone]  Other Instructions Follow-up with PCP for further evaluation   If you have been instructed to have an in-person evaluation today at a local Urgent Care facility, please use the link below. It will take you to a list of all of our available [facility], including address, phone number and hours of operation. Please do not delay care.  [facility]  If you or a family member do not have a primary care provider, use the link below to schedule a visit and establish care. When you choose a [facility] primary care physician or  advanced practice provider, you gain a long-term partner in health. Find a Primary Care Provider  Learn more about [facility]'s in-office and virtual care options: [facility]

## 2022-04-02 NOTE — Progress Notes (Signed)
Virtual Visit Consent   Martha Hall, you are scheduled for a virtual visit with a [facility] provider today. Just as with appointments in the office, your consent must be obtained to participate. Your consent will be active for this visit and any virtual visit you may have with one of our providers in the next 365 days. If you have a MyChart account, a copy of this consent can be sent to you electronically.  As this is a virtual visit, video technology does not allow for your provider to perform a traditional examination. This may limit your provider's ability to fully assess your condition. If your provider identifies any concerns that need to be evaluated in person or the need to arrange testing (such as labs, EKG, etc.), we will make arrangements to do so. Although advances in technology are sophisticated, we cannot ensure that it will always work on either your end or our end. If the connection with a video visit is poor, the visit may have to be switched to a telephone visit. With either a video or telephone visit, we are not always able to ensure that we have a secure connection.  By engaging in this virtual visit, you consent to the provision of healthcare and authorize for your insurance to be billed (if applicable) for the services provided during this visit. Depending on your insurance coverage, you may receive a charge related to this service.  I need to obtain your verbal consent now. Are you willing to proceed with your visit today? Martha Hall has provided verbal consent on 04/02/2022 for a virtual visit (video or telephone). Martha Rigg, NP  Date: 04/02/2022 5:30 PM  Virtual Visit via Video Note   I, Martha Hall, connected with  Martha Hall  ([id number], 08-28-1996) on 04/02/22 at  5:30 PM EST by a video-enabled telemedicine application and verified that I am speaking with the correct person using two identifiers.  Location: Patient: Virtual Visit Location  Patient: Home Provider: Virtual Visit Location Provider: Home Office   I discussed the limitations of evaluation and management by telemedicine and the availability of in person appointments. The patient expressed understanding and agreed to proceed.    History of Present Illness: Martha Hall is a 26 y.o. who identifies as a female who was assigned female at birth, and is being seen today for hyperhidrosis.  Martha Hall has multiple concerns tonight all of which I have redirected to her PCP for further evaluation  States she is experiencing hot flashes for over a year every since her gallbladder was taken out.  Believes she is going through early menopause.  Having to sleep with a fan on every night due to profuse sweating.  Also experiencing persistent headaches for which she is currently taking Tylenol.  Her eyes hurt when she wakes up in the mornings and she believes it could be related to her sinuses as she is currently also experiencing sinus pressure. Wonders if the doxycycline was prescribed a few weeks ago was effective. Feels hot and nauseated all the time. Believes she could also have a blood infection and states she was told she had a hematoma on her liver.    Problems:  Patient Active Problem List   Diagnosis Date Noted   Moderate episode of recurrent major depressive disorder (HCC) 12/15/2021   PTSD (post-traumatic stress disorder) 12/15/2021   GAD (generalized anxiety disorder) 12/15/2021   Chlamydia 10/06/2021   Vaginal yeast infection 10/06/2021   Prediabetes 06/07/2021   Trichomoniasis  of vagina 06/07/2021   Hospital discharge follow-up 02/04/2021   Choledocholithiasis    Cholelithiasis 01/25/2021   History of hydrocephalus 06/11/2019   History of blood clot in brain 05/28/2019   Alpha thalassemia silent carrier 05/12/2019   Class 3 severe obesity due to excess calories without serious comorbidity with body mass index (BMI) of 45.0 to 49.9 in adult [facility]) 04/30/2019     Allergies:  Allergies  Allergen Reactions   Metoclopramide Hives and Itching   Medications:  Current Outpatient Medications:    albuterol (VENTOLIN HFA) 108 (90 Base) MCG/ACT inhaler, Inhale 2 puffs into the lungs every 6 (six) hours as needed for wheezing or shortness of breath., Disp: 8 g, Rfl: 0   doxycycline (VIBRA-TABS) 100 MG tablet, Take 1 tablet (100 mg total) by mouth 2 (two) times daily., Disp: 20 tablet, Rfl: 0   famotidine (PEPCID) 20 MG tablet, Take 1 tablet (20 mg total) by mouth 2 (two) times daily. (Patient not taking: Reported on 03/12/2022), Disp: 30 tablet, Rfl: 0   fluticasone (FLONASE) 50 MCG/ACT nasal spray, Place 2 sprays into both nostrils daily. (Patient not taking: Reported on 03/12/2022), Disp: 48 mL, Rfl: 1   gabapentin (NEURONTIN) 300 MG capsule, Take 1 capsule (300 mg total) by mouth 3 (three) times daily., Disp: 90 capsule, Rfl: 3   omeprazole (PRILOSEC) 20 MG capsule, TAKE 1 CAPSULE BY MOUTH EVERY DAY (Patient not taking: Reported on 03/12/2022), Disp: 90 capsule, Rfl: 0   prazosin (MINIPRESS) 1 MG capsule, Take 1 capsule (1 mg total) by mouth at bedtime., Disp: 30 capsule, Rfl: 3   QUEtiapine (SEROQUEL) 50 MG tablet, Take 1 tablet (50 mg total) by mouth daily., Disp: 30 tablet, Rfl: 3   sertraline (ZOLOFT) 50 MG tablet, Take 1 tablet (50 mg total) by mouth daily., Disp: 30 tablet, Rfl: 3   SUMAtriptan (IMITREX) 25 MG tablet, Take 25 mg (1 tablet total) by mouth at the start of the headache. May repeat in 2 hours x 1 if headache persists. Max of 2 tablets/24 hours. (Patient taking differently: Take 25 mg by mouth every 2 (two) hours as needed for migraine.), Disp: 30 tablet, Rfl: 0   tiZANidine (ZANAFLEX) 4 MG tablet, Take 1 tablet (4 mg total) by mouth every 8 (eight) hours as needed for muscle spasms. (Patient not taking: Reported on 03/12/2022), Disp: 30 tablet, Rfl: 0  Observations/Objective: Patient is well-developed, well-nourished in no acute distress.   Resting comfortably at home.  Head is normocephalic, atraumatic.  No labored breathing.  Speech is clear and coherent with logical content.  Patient is alert and oriented at baseline.    Assessment and Plan: 1. Hyperhidrosis Follow up with primary care for further evaluation     I discussed the assessment and treatment plan with the patient. The patient was provided an opportunity to ask questions and all were answered. The patient agreed with the plan and demonstrated an understanding of the instructions.  A copy of instructions were sent to the patient via MyChart unless otherwise noted below.   The patient was advised to call back or seek an in-person evaluation if the symptoms worsen or if the condition fails to improve as anticipated.  Time:  I spent 11 minutes with the patient via telehealth technology discussing the above problems/concerns.    Martha Rigg, NP

## 2022-04-03 ENCOUNTER — Ambulatory Visit: Payer: Self-pay

## 2022-04-03 NOTE — Telephone Encounter (Signed)
  Chief Complaint: SOB Symptoms: SOB at times, HA, heat intolerance  Frequency: 1 month or more Pertinent Negatives: NA Disposition: [] ED /[] Urgent Care (no appt availability in office) / [x] Appointment(In office/virtual)/ []  [facility]/ [] Home Care/ [] Refused Recommended Disposition /[] [facility]/ []  Follow-up with PCP Additional Notes: pt states prior to viral illness she has had SOB and using inhaler every 6 hrs. Only gets relief for about 1 hour. Pt states she has HA as well and ever since gallbladder removal she has had heat intolerance where she is having to take showers 2-3 times a day. Scheduled appt for tomorrow 0920. Advised pt if she unable to attend to call back to cancel and can go to [facility] tomorrow. Pt verbalized understanding.   Reason for Disposition  [1] MILD difficulty breathing (e.g., minimal/no SOB at rest, SOB with walking, pulse <100) AND [2] NEW-onset or WORSE than normal  Answer Assessment - Initial Assessment Questions 1. RESPIRATORY STATUS: "Describe your breathing?" (e.g., wheezing, shortness of breath, unable to speak, severe coughing)      SOB 2. ONSET: "When did this breathing problem begin?"      Several months now  3. PATTERN "Does the difficult breathing come and go, or has it been constant since it started?"      Constant  4. SEVERITY: "How bad is your breathing?" (e.g., mild, moderate, severe)    - MILD: No SOB at rest, mild SOB with walking, speaks normally in sentences, can lie down, no retractions, pulse < 100.    - MODERATE: SOB at rest, SOB with minimal exertion and prefers to sit, cannot lie down flat, speaks in phrases, mild retractions, audible wheezing, pulse 100-120.    - SEVERE: Very SOB at rest, speaks in single words, struggling to breathe, sitting hunched forward, retractions, pulse > 120      Mild to moderate, 1 hr relief with inhaler  8. CAUSE: "What do you think is causing the breathing problem?"      Unsure if  asthma 9. OTHER SYMPTOMS: "Do you have any other symptoms? (e.g., dizziness, runny nose, cough, chest pain, fever)     HA, heat intolerance  Protocols used: Breathing Difficulty-A-AH
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
## 2022-04-04 ENCOUNTER — Ambulatory Visit (INDEPENDENT_AMBULATORY_CARE_PROVIDER_SITE_OTHER): Payer: Medicaid Other

## 2022-04-04 ENCOUNTER — Other Ambulatory Visit (HOSPITAL_COMMUNITY): Payer: Self-pay | Admitting: Psychiatry

## 2022-04-04 ENCOUNTER — Encounter: Payer: Self-pay | Admitting: Family

## 2022-04-04 ENCOUNTER — Ambulatory Visit (INDEPENDENT_AMBULATORY_CARE_PROVIDER_SITE_OTHER): Payer: Medicaid Other | Admitting: Family

## 2022-04-04 VITALS — BP 108/76 | HR 105 | Temp 98.3°F | Resp 16 | Ht 60.0 in | Wt 240.0 lb

## 2022-04-04 DIAGNOSIS — J321 Chronic frontal sinusitis: Secondary | ICD-10-CM | POA: Diagnosis not present

## 2022-04-04 DIAGNOSIS — S36112S Contusion of liver, sequela: Secondary | ICD-10-CM

## 2022-04-04 DIAGNOSIS — F4310 Post-traumatic stress disorder, unspecified: Secondary | ICD-10-CM

## 2022-04-04 DIAGNOSIS — Z8709 Personal history of other diseases of the respiratory system: Secondary | ICD-10-CM

## 2022-04-04 DIAGNOSIS — R0600 Dyspnea, unspecified: Secondary | ICD-10-CM | POA: Diagnosis not present

## 2022-04-04 DIAGNOSIS — R61 Generalized hyperhidrosis: Secondary | ICD-10-CM

## 2022-04-04 DIAGNOSIS — F411 Generalized anxiety disorder: Secondary | ICD-10-CM

## 2022-04-04 DIAGNOSIS — G43909 Migraine, unspecified, not intractable, without status migrainosus: Secondary | ICD-10-CM

## 2022-04-04 DIAGNOSIS — F331 Major depressive disorder, recurrent, moderate: Secondary | ICD-10-CM

## 2022-04-04 DIAGNOSIS — Z8669 Personal history of other diseases of the nervous system and sense organs: Secondary | ICD-10-CM

## 2022-04-04 MED ORDER — ALBUTEROL SULFATE HFA 108 (90 BASE) MCG/ACT IN AERS: 2.0000 | INHALATION_SPRAY | Freq: Four times a day (QID) | RESPIRATORY_TRACT | 2 refills | Status: DC | PRN

## 2022-04-04 MED ORDER — DOXYCYCLINE HYCLATE 100 MG PO TABS: 100.0000 mg | ORAL_TABLET | Freq: Two times a day (BID) | ORAL | 0 refills | Status: DC

## 2022-04-04 MED ORDER — ALBUTEROL SULFATE (2.5 MG/3ML) 0.083% IN NEBU: 2.5000 mg | INHALATION_SOLUTION | Freq: Four times a day (QID) | RESPIRATORY_TRACT | 1 refills | Status: DC | PRN

## 2022-04-04 MED ORDER — FLUTICASONE PROPIONATE HFA 44 MCG/ACT IN AERO: 2.0000 | INHALATION_SPRAY | Freq: Every day | RESPIRATORY_TRACT | 1 refills | Status: DC

## 2022-04-04 NOTE — Progress Notes (Signed)
Pt presents for excessive sweating and SOB -states SOB started about 3 months ago, states that inhaler is only giving her about an hour of relief -pt complains of excessive sweating where she has to take cold baths and it happens frequently throughout the day -request referral to neurology for frequent headaches, states mother advised her she should get this checked out due to her having a stent -pt questioned if she takes the Imitrex for the headaches "I haven't taken that in months".

## 2022-04-06 NOTE — Telephone Encounter (Signed)
Rosey Bath with [facility] called in stating the order did was not signed or dated and she needs a qualifying diagnoses sent as well. She will be sending over a fax. Please assist further.

## 2022-04-06 NOTE — Telephone Encounter (Signed)
Order signed and faxed back to [facility]
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
## 2022-04-12 ENCOUNTER — Encounter: Payer: Self-pay | Admitting: Pulmonary Disease

## 2022-04-12 ENCOUNTER — Ambulatory Visit (INDEPENDENT_AMBULATORY_CARE_PROVIDER_SITE_OTHER): Payer: Medicaid Other | Admitting: Pulmonary Disease

## 2022-04-12 VITALS — BP 110/80 | HR 83 | Temp 98.4°F | Ht 60.0 in | Wt 243.4 lb

## 2022-04-12 DIAGNOSIS — Q039 Congenital hydrocephalus, unspecified: Secondary | ICD-10-CM | POA: Diagnosis not present

## 2022-04-12 DIAGNOSIS — R0600 Dyspnea, unspecified: Secondary | ICD-10-CM | POA: Diagnosis not present

## 2022-04-12 DIAGNOSIS — Z6841 Body Mass Index (BMI) 40.0 and over, adult: Secondary | ICD-10-CM | POA: Diagnosis not present

## 2022-04-12 DIAGNOSIS — G43709 Chronic migraine without aura, not intractable, without status migrainosus: Secondary | ICD-10-CM | POA: Diagnosis not present

## 2022-04-12 DIAGNOSIS — J4530 Mild persistent asthma, uncomplicated: Secondary | ICD-10-CM

## 2022-04-12 DIAGNOSIS — K219 Gastro-esophageal reflux disease without esophagitis: Secondary | ICD-10-CM

## 2022-04-12 DIAGNOSIS — J309 Allergic rhinitis, unspecified: Secondary | ICD-10-CM

## 2022-04-12 DIAGNOSIS — Z8709 Personal history of other diseases of the respiratory system: Secondary | ICD-10-CM

## 2022-04-12 LAB — CBC WITH DIFFERENTIAL/PLATELET
Basophils Absolute: 0 10*3/uL (ref 0.0–0.1)
Basophils Relative: 0.8 % (ref 0.0–3.0)
Eosinophils Absolute: 0.1 10*3/uL (ref 0.0–0.7)
Eosinophils Relative: 1.2 % (ref 0.0–5.0)
HCT: 38.6 % (ref 36.0–46.0)
Hemoglobin: 12.6 g/dL (ref 12.0–15.0)
Lymphocytes Relative: 38.8 % (ref 12.0–46.0)
Lymphs Abs: 2.1 10*3/uL (ref 0.7–4.0)
MCHC: 32.6 g/dL (ref 30.0–36.0)
MCV: 84.9 fl (ref 78.0–100.0)
Monocytes Absolute: 0.3 10*3/uL (ref 0.1–1.0)
Monocytes Relative: 6.6 % (ref 3.0–12.0)
Neutro Abs: 2.8 10*3/uL (ref 1.4–7.7)
Neutrophils Relative %: 52.6 % (ref 43.0–77.0)
Platelets: 329 10*3/uL (ref 150.0–400.0)
RBC: 4.54 Mil/uL (ref 3.87–5.11)
RDW: 13.5 % (ref 11.5–15.5)
WBC: 5.3 10*3/uL (ref 4.0–10.5)

## 2022-04-12 LAB — POCT EXHALED NITRIC OXIDE: FeNO level (ppb): 14

## 2022-04-12 MED ORDER — AEROCHAMBER MV MISC: 0 refills | Status: AC

## 2022-04-12 MED ORDER — AEROCHAMBER MV MISC: 0 refills | Status: DC

## 2022-04-12 NOTE — Addendum Note (Signed)
Addended by: Glynda Jaeger on: 04/12/2022 10:36 AM   Modules accepted: Orders

## 2022-04-12 NOTE — Addendum Note (Signed)
Addended by: Glynda Jaeger on: 04/12/2022 10:02 AM   Modules accepted: Orders

## 2022-04-12 NOTE — Patient Instructions (Signed)
Intermittent shortness of breath with baseline allergic rhinitis: I believe you have mild persistent asthma though I need to get testing to assess further. Lung function test Exhaled nitric oxide test Blood work: CBC with differential and serum IgE to look for evidence of allergy Increase Flovent to 90 mcg 2 puffs twice a day through a spacer Continue using albuterol as needed for chest tightness wheezing or shortness of breath  We will see you back in 4 to 6 weeks with a lung function test to see how you are doing, let us know if you need anything between now and then.

## 2022-04-19 NOTE — Telephone Encounter (Signed)
Elease Hashimoto from [employer] called. States spacers NL avail. w/them PT would have to go to refill store.

## 2022-05-02 ENCOUNTER — Encounter: Payer: Self-pay | Admitting: Family

## 2022-05-03 MED ORDER — FLUTICASONE PROPIONATE HFA 110 MCG/ACT IN AERO: 2.0000 | INHALATION_SPRAY | Freq: Two times a day (BID) | RESPIRATORY_TRACT | 12 refills | Status: DC

## 2022-05-03 NOTE — Telephone Encounter (Signed)
Last OV pt had: Plan: Intermittent shortness of breath with baseline allergic rhinitis: I believe you have mild persistent asthma though I need to get testing to assess further. Lung function test Exhaled nitric oxide test Blood work: CBC with differential and serum IgE to look for evidence of allergy Increase Flovent to 90 mcg 2 puffs twice a day through a spacer Continue using albuterol as needed for chest tightness wheezing or shortness of breath    Looks like new Rx for Flovent was never sent to pharmacy after pt's last OV.  Checked to see if I could send Rx for and when checking this, saw that the doses are Flovent , Flovent , Flovent .  Dr. Kendrick Fries, please advise on this.

## 2022-05-03 NOTE — Telephone Encounter (Signed)
Pulmonology has been contacted by pt and Flovent inhaler refill request completed

## 2022-05-06 ENCOUNTER — Other Ambulatory Visit: Payer: Self-pay

## 2022-05-06 ENCOUNTER — Emergency Department (HOSPITAL_COMMUNITY)
Admission: EM | Admit: 2022-05-06 | Discharge: 2022-05-06 | Disposition: A | Discharge status: Home / Self Care | Payer: Medicaid Other | Attending: Emergency Medicine | Admitting: Emergency Medicine

## 2022-05-06 DIAGNOSIS — R0602 Shortness of breath: Secondary | ICD-10-CM | POA: Insufficient documentation

## 2022-05-06 DIAGNOSIS — F410 Panic disorder [episodic paroxysmal anxiety] without agoraphobia: Secondary | ICD-10-CM | POA: Diagnosis not present

## 2022-05-06 DIAGNOSIS — Z7951 Long term (current) use of inhaled steroids: Secondary | ICD-10-CM | POA: Diagnosis not present

## 2022-05-06 DIAGNOSIS — J45909 Unspecified asthma, uncomplicated: Secondary | ICD-10-CM | POA: Diagnosis not present

## 2022-05-06 MED ORDER — LORAZEPAM 0.5 MG PO TABS: 0.5000 mg | ORAL_TABLET | Freq: Three times a day (TID) | ORAL | 0 refills | Status: DC | PRN

## 2022-05-06 NOTE — ED Provider Notes (Signed)
[facility]-EMERGENCY DEPT Provider Note   CSN: [id number] Arrival date & time: 05/06/22  0004     History  Chief Complaint  Patient presents with   Shortness of Breath    Martha Hall is a 26 y.o. female.  The history is provided by the patient.  Patient reports long history of shortness of breath.  She reports she has had multiple evaluations by her PCP and also recently seen by pulmonology.  She was diagnosed with asthma.  She is still undergoing testing.  She has nebulizers and medications at home.  She does not use them daily.  She is a non-smoker. She reports there are environmental factors at home like neighbors burning wood   Patient also reports frequent panic attacks at home  No active chest pain at this time.  No fevers.  No lower extremity edema Home Medications Prior to Admission medications   Medication Sig Start Date End Date Taking? Authorizing Provider  LORazepam (ATIVAN) 0.5 MG tablet Take 1 tablet (0.5 mg total) by mouth every 8 (eight) hours as needed for anxiety. 05/06/22  Yes Zadie Rhine, MD  albuterol (PROVENTIL) (2.5 MG/3ML) 0.083% nebulizer solution Take 3 mLs (2.5 mg total) by nebulization every 6 (six) hours as needed for wheezing or shortness of breath. 04/04/22   Rema Fendt, NP  albuterol (VENTOLIN HFA) 108 (90 Base) MCG/ACT inhaler Inhale 2 puffs into the lungs every 6 (six) hours as needed for wheezing or shortness of breath. 04/04/22   Rema Fendt, NP  doxycycline (VIBRA-TABS) 100 MG tablet Take 1 tablet (100 mg total) by mouth 2 (two) times daily. 04/04/22   Rema Fendt, NP  fluticasone (FLOVENT HFA) 110 MCG/ACT inhaler Inhale 2 puffs into the lungs in the morning and at bedtime. 05/03/22   Lupita Leash, MD  gabapentin (NEURONTIN) 300 MG capsule Take 1 capsule (300 mg total) by mouth 3 (three) times daily. 03/09/22   Shanna Cisco, NP  omeprazole (PRILOSEC) 20 MG capsule TAKE 1 CAPSULE BY MOUTH  EVERY DAY Patient not taking: Reported on 03/12/2022 01/12/22   Rema Fendt, NP  prazosin (MINIPRESS) 1 MG capsule Take 1 capsule (1 mg total) by mouth at bedtime. 03/09/22   Shanna Cisco, NP  QUEtiapine (SEROQUEL) 50 MG tablet Take 1 tablet (50 mg total) by mouth daily. 03/09/22   Shanna Cisco, NP  sertraline (ZOLOFT) 50 MG tablet Take 1 tablet (50 mg total) by mouth daily. 03/09/22   Shanna Cisco, NP  Spacer/Aero-Holding Chambers (AEROCHAMBER MV) inhaler Use as instructed 04/12/22   Lupita Leash, MD  SUMAtriptan (IMITREX) 25 MG tablet Take 25 mg (1 tablet total) by mouth at the start of the headache. May repeat in 2 hours x 1 if headache persists. Max of 2 tablets/24 hours. Patient taking differently: Take 25 mg by mouth every 2 (two) hours as needed for migraine. 06/06/21   Rema Fendt, NP  diphenhydrAMINE (BENADRYL) 25 MG tablet Take 12.5 mg by mouth every 6 (six) hours as needed for allergies. Patient not taking: Reported on 12/30/2019  06/21/20  [provider]      Allergies    Metoclopramide    Review of Systems   Review of Systems  Constitutional:  Negative for fever.  Respiratory:  Positive for shortness of breath and wheezing. Negative for cough.   Cardiovascular:  Negative for chest pain.  Psychiatric/Behavioral:  The patient is nervous/anxious.     Physical Exam Updated  Vital Signs BP (!) 147/95 (BP Location: Right Arm)   Pulse 73   Temp 98.2 F (36.8 C) (Oral)   Resp 20   Ht 1.524 m (5')   Wt 111 kg   SpO2 98%   BMI 47.79 kg/m  Physical Exam CONSTITUTIONAL: Well developed/well nourished, using her phone throughout the exam HEAD: Normocephalic/atraumatic EYES: EOMI/PERRL ENMT: Mucous membranes moist NECK: supple no meningeal signs CV: S1/S2 noted, no murmurs/rubs/gallops noted LUNGS: Lungs are clear to auscultation bilaterally, no apparent distress ABDOMEN: soft, nontender, obese  GU:no cva tenderness NEURO: Pt is  awake/alert/appropriate, moves all extremitiesx4.  No facial droop.   EXTREMITIES:  full ROM, no lower extremity edema, no calf tenderness SKIN: warm, color normal PSYCH: no abnormalities of mood noted, alert and oriented to situation  ED Results / Procedures / Treatments   Labs (all labs ordered are listed, but only abnormal results are displayed) Labs Reviewed - No data to display  EKG None  Radiology No results found.  Procedures Procedures    Medications Ordered in ED Medications - No data to display  ED Course/ Medical Decision Making/ A&P Clinical Course as of 05/06/22 0426  Sat May 06, 2022  0426 Patient presents for chronic worsening of her shortness of breath.  She is in no acute distress.  No added lung sounds.  Patient admits that she is having frequent panic attacks.  She also admits that she is not taking her medications daily.  I advised her to take her medications as prescribed.  Due to her frequent panic attacks, have offered her a short course of Ativan.  She has pulmonology follow-up scheduled for next month [DW]    Clinical Course User Index [DW] Zadie Rhine, MD                           Medical Decision Making Risk Prescription drug management.           Final Clinical Impression(s) / ED Diagnoses Final diagnoses:  Shortness of breath  Panic attack    Rx / DC Orders ED Discharge Orders          Ordered    LORazepam (ATIVAN) 0.5 MG tablet  Every 8 hours PRN        05/06/22 0421              Zadie Rhine, MD 05/06/22 [phone]

## 2022-05-06 NOTE — ED Triage Notes (Signed)
Patient c/o SOB x6 days. Pt report recently diagnose with asthma last month and started with inhalers. Pt report inhalers not working at this time. Pt denies N/V/D. Pt denies fever.

## 2022-05-08 DIAGNOSIS — R0600 Dyspnea, unspecified: Secondary | ICD-10-CM | POA: Diagnosis not present

## 2022-05-17 ENCOUNTER — Encounter: Payer: Self-pay | Admitting: Family

## 2022-05-17 ENCOUNTER — Ambulatory Visit
Admission: EM | Admit: 2022-05-17 | Discharge: 2022-05-17 | Disposition: A | Discharge status: Home / Self Care | Payer: Medicaid Other | Attending: Physician Assistant | Admitting: Physician Assistant

## 2022-05-17 DIAGNOSIS — N926 Irregular menstruation, unspecified: Secondary | ICD-10-CM | POA: Diagnosis not present

## 2022-05-17 DIAGNOSIS — R109 Unspecified abdominal pain: Secondary | ICD-10-CM | POA: Diagnosis not present

## 2022-05-17 LAB — POCT URINALYSIS DIP (MANUAL ENTRY)
Bilirubin, UA: NEGATIVE
Blood, UA: NEGATIVE
Glucose, UA: NEGATIVE mg/dL
Ketones, POC UA: NEGATIVE mg/dL
Leukocytes, UA: NEGATIVE
Nitrite, UA: NEGATIVE
Protein Ur, POC: 30 mg/dL — AB
Spec Grav, UA: >=1.03 — AB (ref 1.010–1.025)
Urobilinogen, UA: 0.2 E.U./dL
pH, UA: 6 (ref 5.0–8.0)

## 2022-05-17 LAB — POCT URINE PREGNANCY: Preg Test, Ur: NEGATIVE

## 2022-05-17 NOTE — ED Provider Notes (Signed)
[name]    CSN: [id number] Arrival date & time: 05/17/22  0846      History   Chief Complaint Chief Complaint  Patient presents with   Abdominal Pain    [name] is a 27 y.o. female.   Patient here today for evaluation of suprapubic cramping that started recently.  She states that she has had irregular periods over the last few months.  She notes she has taken pregnancy test at home that were negative.  Her last menstrual period was about 2 to 3 weeks ago. She reports some breast tenderness, nausea at times as well as occasional lightheadedness. She does not report treatment for symptoms.   The history is provided by the patient.    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 Per consultation with Dr. Rosana Hoes, no further work-up necessary. Per MFM: " The patient was reassured that the anti-Lewis antibodies are usually of the IgM subtype and therefore we will not cross the placenta and cause fetal anemia.  The Lewis antibodies should therefore not affect the management of her current pregnancy."   Vaginal trichomoniasis 05/09/2019   04/30/2019 on Pap [] tx [] TOC- neg    Patient Active Problem List   Diagnosis Date Noted   Moderate episode of recurrent major depressive disorder ([place]) 12/15/2021   PTSD (post-traumatic stress disorder) 12/15/2021   GAD (generalized anxiety disorder) 12/15/2021   Prediabetes 06/07/2021   Hospital discharge follow-up 02/04/2021   Choledocholithiasis    Cholelithiasis 01/25/2021   History of hydrocephalus 06/11/2019   History of blood clot in brain 05/28/2019   Alpha thalassemia silent carrier 05/12/2019   Class 3 severe obesity due to excess calories without serious comorbidity  with body mass index (BMI) of 45.0 to 49.9 in adult [facility]) 04/30/2019    Past Surgical History:  Procedure Laterality Date   BRAIN SURGERY  1997/06/20   stint placed when born   CHOLECYSTECTOMY N/A 01/25/2021   Procedure: LAPAROSCOPIC CHOLECYSTECTOMY WITH ATTEMPTED CHOLANGIOGRAM;  Surgeon: Coralie Keens, MD;  Location: [place];  Service: General;  Laterality: N/A;   ERCP N/A 01/29/2021   Procedure: ENDOSCOPIC RETROGRADE CHOLANGIOPANCREATOGRAPHY (ERCP);  Surgeon: Gatha Mayer, MD;  Location[facility] ENDOSCOPY;  Service: Endoscopy;  Laterality: N/A;   IR RADIOLOGIST EVAL & MGMT  03/23/2021   REMOVAL OF STONES  01/29/2021   Procedure: REMOVAL OF STONES;  Surgeon: Gatha Mayer, MD;  Location: [facility];  Service: Endoscopy;;   SPHINCTEROTOMY  01/29/2021   Procedure: Joan Mayans;  Surgeon: Gatha Mayer, MD;  Location: [facility] ENDOSCOPY;  Service: Endoscopy;;    OB History     Gravida  1   Para  1   Term  1   Preterm      AB  0   Living  1      SAB  0   IAB      Ectopic      Multiple  0   Live Births  1            Home Medications    Prior to Admission medications   Medication Sig Start Date End Date Taking? Authorizing Provider  albuterol (PROVENTIL) (2.5 MG/3ML) 0.083% nebulizer solution Take 3 mLs (2.5 mg  total) by nebulization every 6 (six) hours as needed for wheezing or shortness of breath. 04/04/22   Camillia Herter, NP  albuterol (VENTOLIN HFA) 108 (90 Base) MCG/ACT inhaler Inhale 2 puffs into the lungs every 6 (six) hours as needed for wheezing or shortness of breath. 04/04/22   Camillia Herter, NP  doxycycline (VIBRA-TABS) 100 MG tablet Take 1 tablet (100 mg total) by mouth 2 (two) times daily. 04/04/22   Camillia Herter, NP  fluticasone (FLOVENT HFA) 110 MCG/ACT inhaler Inhale 2 puffs into the lungs in the morning and at bedtime. 05/03/22   Juanito Doom, MD  gabapentin (NEURONTIN) 300 MG capsule Take 1 capsule (300 mg total) by mouth 3 (three) times  daily. 03/09/22   Salley Slaughter, NP  LORazepam (ATIVAN) 0.5 MG tablet Take 1 tablet (0.5 mg total) by mouth every 8 (eight) hours as needed for anxiety. 05/06/22   Ripley Fraise, MD  omeprazole (PRILOSEC) 20 MG capsule TAKE 1 CAPSULE BY MOUTH EVERY DAY Patient not taking: Reported on 03/12/2022 01/12/22   Camillia Herter, NP  prazosin (MINIPRESS) 1 MG capsule Take 1 capsule (1 mg total) by mouth at bedtime. 03/09/22   Salley Slaughter, NP  QUEtiapine (SEROQUEL) 50 MG tablet Take 1 tablet (50 mg total) by mouth daily. 03/09/22   Salley Slaughter, NP  sertraline (ZOLOFT) 50 MG tablet Take 1 tablet (50 mg total) by mouth daily. 03/09/22   Salley Slaughter, NP  Spacer/Aero-Holding Chambers (AEROCHAMBER MV) inhaler Use as instructed 04/12/22   Juanito Doom, MD  SUMAtriptan (IMITREX) 25 MG tablet Take 25 mg (1 tablet total) by mouth at the start of the headache. May repeat in 2 hours x 1 if headache persists. Max of 2 tablets/24 hours. Patient taking differently: Take 25 mg by mouth every 2 (two) hours as needed for migraine. 06/06/21   Camillia Herter, NP  diphenhydrAMINE (BENADRYL) 25 MG tablet Take 12.5 mg by mouth every 6 (six) hours as needed for allergies. Patient not taking: Reported on 12/30/2019  06/21/20  [provider]    Family History Family History  Problem Relation Age of Onset   Healthy Mother    Healthy Father     Social History Social History   Tobacco Use   Smoking status: Never    Passive exposure: Never   Smokeless tobacco: Never  Vaping Use   Vaping Use: Never used  Substance Use Topics   Alcohol use: Never   Drug use: Never     Allergies   Metoclopramide   Review of Systems Review of Systems  Constitutional:  Negative for chills and fever.  Eyes:  Negative for discharge and redness.  Respiratory:  Negative for shortness of breath.   Gastrointestinal:  Positive for abdominal pain and nausea. Negative for diarrhea and vomiting.      Physical Exam Triage Vital Signs ED Triage Vitals  Enc Vitals Group     BP      Pulse      Resp      Temp      Temp src      SpO2      Weight      Height      Head Circumference      Peak Flow      Pain Score      Pain Loc      Pain Edu?      Excl. in [place]?    No data  found.  Updated Vital Signs BP 111/76 (BP Location: Left Arm)   Pulse 89   Temp 98 F (36.7 C) (Oral)   Resp 16   SpO2 98%      Physical Exam Vitals and nursing note reviewed.  Constitutional:      General: She is not in acute distress.    Appearance: Normal appearance. She is not ill-appearing.  HENT:     Head: Normocephalic and atraumatic.  Eyes:     Conjunctiva/sclera: Conjunctivae normal.  Cardiovascular:     Rate and Rhythm: Normal rate.  Pulmonary:     Effort: Pulmonary effort is normal. No respiratory distress.  Neurological:     Mental Status: She is alert.  Psychiatric:        Mood and Affect: Mood normal.        Behavior: Behavior normal.        Thought Content: Thought content normal.      UC Treatments / Results  Labs (all labs ordered are listed, but only abnormal results are displayed) Labs Reviewed  POCT URINALYSIS DIP (MANUAL ENTRY) - Abnormal; Notable for the following components:      Result Value   Clarity, UA cloudy (*)    Spec Grav, UA >=1.030 (*)    Protein Ur, POC =30 (*)    All other components within normal limits  POCT URINE PREGNANCY  CERVICOVAGINAL ANCILLARY ONLY    EKG   Radiology No results found.  Procedures Procedures (including critical care time)  Medications Ordered in UC Medications - No data to display  Initial Impression / Assessment and Plan / UC Course  I have reviewed the triage vital signs and the nursing notes.  Pertinent labs & imaging results that were available during my care of the patient were reviewed by me and considered in my medical decision making (see chart for details).    UA clear, negative urine pregnancy  screening, STD screening ordered. Recommended she repeat urinary pregnancy in 1-2 weeks if she is still not menstruating. Patient expresses understanding. Encouraged follow up if symptoms do not improve or worsen in any way.   Final Clinical Impressions(s) / UC Diagnoses   Final diagnoses:  Abdominal cramping  Irregular menstrual cycle   Discharge Instructions   None    ED Prescriptions   None    PDMP not reviewed this encounter.   Tomi Bamberger, PA-C 05/17/22 1920

## 2022-05-17 NOTE — ED Triage Notes (Signed)
Pt c/o abd cramping onset ~ 04/21/22. Also c/o irregular cycles.

## 2022-05-18 ENCOUNTER — Other Ambulatory Visit: Payer: Self-pay | Admitting: Family

## 2022-05-18 ENCOUNTER — Telehealth (HOSPITAL_COMMUNITY): Payer: Self-pay | Admitting: Emergency Medicine

## 2022-05-18 DIAGNOSIS — Z01419 Encounter for gynecological examination (general) (routine) without abnormal findings: Secondary | ICD-10-CM

## 2022-05-18 LAB — CERVICOVAGINAL ANCILLARY ONLY
Bacterial Vaginitis (gardnerella): POSITIVE — AB
Candida Glabrata: NEGATIVE
Candida Vaginitis: NEGATIVE
Chlamydia: NEGATIVE
Comment: NEGATIVE
Comment: NEGATIVE
Comment: NEGATIVE
Comment: NEGATIVE
Comment: NEGATIVE
Comment: NORMAL
Neisseria Gonorrhea: NEGATIVE
Trichomonas: POSITIVE — AB

## 2022-05-18 MED ORDER — METRONIDAZOLE 500 MG PO TABS: 500.0000 mg | ORAL_TABLET | Freq: Two times a day (BID) | ORAL | 0 refills | Status: DC

## 2022-05-18 NOTE — Telephone Encounter (Signed)
Order complete. 

## 2022-05-23 ENCOUNTER — Ambulatory Visit (INDEPENDENT_AMBULATORY_CARE_PROVIDER_SITE_OTHER): Payer: Medicaid Other | Admitting: Pulmonary Disease

## 2022-05-23 ENCOUNTER — Encounter: Payer: Self-pay | Admitting: Pulmonary Disease

## 2022-05-23 VITALS — BP 110/76 | HR 96 | Temp 97.9°F | Ht 60.0 in | Wt 244.0 lb

## 2022-05-23 DIAGNOSIS — R0600 Dyspnea, unspecified: Secondary | ICD-10-CM

## 2022-05-23 DIAGNOSIS — K219 Gastro-esophageal reflux disease without esophagitis: Secondary | ICD-10-CM

## 2022-05-23 DIAGNOSIS — J309 Allergic rhinitis, unspecified: Secondary | ICD-10-CM

## 2022-05-23 DIAGNOSIS — J4530 Mild persistent asthma, uncomplicated: Secondary | ICD-10-CM | POA: Diagnosis not present

## 2022-05-23 DIAGNOSIS — J4540 Moderate persistent asthma, uncomplicated: Secondary | ICD-10-CM

## 2022-05-23 DIAGNOSIS — F410 Panic disorder [episodic paroxysmal anxiety] without agoraphobia: Secondary | ICD-10-CM

## 2022-05-23 LAB — PULMONARY FUNCTION TEST
DL/VA % pred: 134 %
DL/VA: 6.4 ml/min/mmHg/L
DLCO cor % pred: 108 %
DLCO cor: 22.99 ml/min/mmHg
DLCO unc % pred: 105 %
DLCO unc: 22.41 ml/min/mmHg
FEF 25-75 Post: 2.96 L/sec
FEF 25-75 Pre: 3.49 L/sec
FEF2575-%Change-Post: -15 %
FEF2575-%Pred-Post: 83 %
FEF2575-%Pred-Pre: 98 %
FEV1-%Change-Post: 5 %
FEV1-%Pred-Post: 87 %
FEV1-%Pred-Pre: 82 %
FEV1-Post: 2.73 L
FEV1-Pre: 2.57 L
FEV1FVC-%Change-Post: 2 %
FEV1FVC-%Pred-Pre: 100 %
FEV6-%Change-Post: 14 %
FEV6-%Pred-Post: 85 %
FEV6-%Pred-Pre: 74 %
FEV6-Post: 3.08 L
FEV6-Pre: 2.7 L
FEV6FVC-%Pred-Post: 100 %
FEV6FVC-%Pred-Pre: 100 %
FVC-%Change-Post: 3 %
FVC-%Pred-Post: 85 %
FVC-%Pred-Pre: 82 %
FVC-Post: 3.08 L
FVC-Pre: 2.98 L
Post FEV1/FVC ratio: 88 %
Post FEV6/FVC ratio: 100 %
Pre FEV1/FVC ratio: 86 %
Pre FEV6/FVC Ratio: 100 %
RV % pred: 110 %
RV: 1.32 L
TLC % pred: 82 %
TLC: 3.98 L

## 2022-05-23 MED ORDER — FLUTICASONE-SALMETEROL 115-21 MCG/ACT IN AERO: 2.0000 | INHALATION_SPRAY | Freq: Two times a day (BID) | RESPIRATORY_TRACT | 12 refills | Status: DC

## 2022-05-23 NOTE — Patient Instructions (Signed)
Performed Full PFT Today.   ?

## 2022-05-23 NOTE — Progress Notes (Signed)
Synopsis: Referred in 04/11/2022 for dyspnea  Subjective:   [name] GENDER: female DOB: [birth year], MRN: [id number]   HPI  Chief Complaint  Patient presents with   Follow-up    Pt is having concerns of SOB, pt had went to ED for panic attack. Pt has been using inhalers.     Her lungs are "crushing [her]".  She feels like there is squeezing in her chest that makes her short of breath. She says that she has worsenng dyspnea at night, feels like she struggling to breathe.  She says that she is using the albuterol daily.   She continues to use twice a day She has not had a lung function test yet. She has been to the emergency department for shortness of breath.  May 06, 2022 ER visit record reviewed where she was seen in the context of shortness of breath and panic attacks.  Lorazepam was prescribed and she was advised to follow-up with [place], use albuterol as needed.  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 Per consultation with Dr. Earlene Plater, no further work-up necessary. Per MFM: " The patient was reassured that the anti-Lewis antibodies are usually of the IgM subtype and therefore we will not cross the placenta and cause fetal anemia.  The Lewis antibodies should therefore not affect the management of her current pregnancy."   Vaginal trichomoniasis 05/09/2019   04/30/2019 on Pap [] tx [] TOC- neg      Review of Systems  Constitutional:  Negative for chills, fever, malaise/fatigue and weight loss.  HENT:  Negative for congestion, sinus pain and sore throat.   Respiratory:  Positive for shortness of breath and wheezing. Negative for cough and sputum production.   Cardiovascular:  Negative for chest pain and leg swelling.      Objective:  Physical Exam   Vitals:   05/23/22 1001  BP: 110/76  Pulse: 96  Temp: 97.9 F (36.6 C)  TempSrc: Oral  SpO2: 99%  Weight: 244 lb (110.7 kg)  Height: 5' (1.524 m)   Gen: well appearing HENT: OP clear, neck supple PULM: CTA B, normal effort  CV: RRR, no mgr GI: BS+, soft, nontender Derm: no cyanosis or rash Psyche: normal mood and affect   CBC    Component Value Date/Time   WBC 5.3 04/12/2022 1004   RBC 4.54 04/12/2022 1004   HGB 12.6 04/12/2022 1004   HGB 11.8 09/04/2019 1017   HCT 38.6 04/12/2022 1004   HCT 36.7 09/04/2019 1017   PLT 329.0 04/12/2022 1004   PLT 261 09/04/2019 1017   MCV 84.9 04/12/2022 1004   MCV 86 09/04/2019 1017   MCH 28.8 09/29/2021 1250   MCHC 32.6 04/12/2022 1004   RDW 13.5 04/12/2022 1004   RDW 13.5 09/04/2019 1017   LYMPHSABS 2.1 04/12/2022 1004   LYMPHSABS 1.2 04/30/2019 1051   MONOABS 0.3 04/12/2022 1004   EOSABS 0.1 04/12/2022 1004   EOSABS 0.1 04/30/2019 1051   BASOSABS 0.0 04/12/2022 1004   BASOSABS 0.0 04/30/2019 1051     Chest imaging: 11/28 CXR > normal  PFT:  Labs: December 2023 eosinophil 100k cells per microliter  Path:  Echo:  Heart Catheterization:       Assessment &  Plan:   Allergic rhinitis, unspecified seasonality, unspecified trigger  Dyspnea, unspecified type  Gastroesophageal reflux disease, unspecified whether esophagitis present  Panic attacks  Moderate persistent asthma without complication  Discussion: Ms. Vernell Barrier has not had lung function testing yet to evaluate for airflow obstruction or bronchodilator response.  As stated previously she has a high pretest probability of having asthma given her symptoms and lifelong allergies.  Based on this and her worsening symptoms I think it is reasonable to increase her current inhaler regimen for poorly controlled asthma.  However, if lung function testing is stone cold normal then we would need to focus more of our care on her  anxiety as this can certainly cause more shortness of breath.  Moderate persistent asthma, poorly controlled: A lung function test scheduled for you at our [place] location on [address] Stop inhaled fluticasone Start taking Advair 115 HFA 2 puffs twice daily no matter how you feel Continue to use albuterol as needed for chest tightness wheezing or shortness of breath  Anxiety with panic attacks: Continue follow-up with your psychiatrist If we find that lung function testing is normal then we will ask you to work with your psychiatrist for better control of panic attacks  We will see you back in 3 weeks or sooner if needed.  Immunizations: Immunization History  Administered Date(s) Administered   HPV 9-valent 06/06/2021   Influenza-Unspecified 03/08/2022   PFIZER(Purple Top)SARS-COV-2 Vaccination 09/29/2019, 10/21/2019   Tdap 09/04/2019     Current Outpatient Medications:    albuterol (PROVENTIL) (2.5 MG/3ML) 0.083% nebulizer solution, Take 3 mLs (2.5 mg total) by nebulization every 6 (six) hours as needed for wheezing or shortness of breath., Disp: 150 mL, Rfl: 1   albuterol (VENTOLIN HFA) 108 (90 Base) MCG/ACT inhaler, Inhale 2 puffs into the lungs every 6 (six) hours as needed for wheezing or shortness of breath., Disp: 8 g, Rfl: 2   fluticasone (FLOVENT HFA) 110 MCG/ACT inhaler, Inhale 2 puffs into the lungs in the morning and at bedtime., Disp: 12 each, Rfl: 12   fluticasone-salmeterol (ADVAIR HFA) 115-21 MCG/ACT inhaler, Inhale 2 puffs into the lungs 2 (two) times daily., Disp: 1 each, Rfl: 12   gabapentin (NEURONTIN) 300 MG capsule, Take 1 capsule (300 mg total) by mouth 3 (three) times daily., Disp: 90 capsule, Rfl: 3   prazosin (MINIPRESS) 1 MG capsule, Take 1 capsule (1 mg total) by mouth at bedtime., Disp: 30 capsule, Rfl: 3   QUEtiapine (SEROQUEL) 50 MG tablet, Take 1 tablet (50 mg total) by mouth daily., Disp: 30 tablet, Rfl: 3   sertraline (ZOLOFT) 50 MG  tablet, Take 1 tablet (50 mg total) by mouth daily., Disp: 30 tablet, Rfl: 3   doxycycline (VIBRA-TABS) 100 MG tablet, Take 1 tablet (100 mg total) by mouth 2 (two) times daily. (Patient not taking: Reported on 05/23/2022), Disp: 20 tablet, Rfl: 0   LORazepam (ATIVAN) 0.5 MG tablet, Take 1 tablet (0.5 mg total) by mouth every 8 (eight) hours as needed for anxiety. (Patient not taking: Reported on 05/23/2022), Disp: 5 tablet, Rfl: 0   metroNIDAZOLE (FLAGYL) 500 MG tablet, Take 1 tablet (500 mg total) by mouth 2 (two) times daily. (Patient not taking: Reported on 05/23/2022), Disp: 14 tablet, Rfl: 0   omeprazole (PRILOSEC) 20 MG capsule, TAKE 1 CAPSULE BY MOUTH EVERY DAY (Patient not taking: Reported on 03/12/2022), Disp: 90 capsule, Rfl: 0   Spacer/Aero-Holding Chambers (AEROCHAMBER MV) inhaler, Use as instructed (Patient not taking: Reported on 05/23/2022), Disp: 1  each, Rfl: 0   SUMAtriptan (IMITREX) 25 MG tablet, Take 25 mg (1 tablet total) by mouth at the start of the headache. May repeat in 2 hours x 1 if headache persists. Max of 2 tablets/24 hours. (Patient taking differently: Take 25 mg by mouth every 2 (two) hours as needed for migraine.), Disp: 30 tablet, Rfl: 0

## 2022-05-23 NOTE — Patient Instructions (Signed)
Moderate persistent asthma, poorly controlled: A lung function test scheduled for you at our [place] location on [address] Stop inhaled fluticasone Start taking Advair 115 HFA 2 puffs twice daily no matter how you feel Continue to use albuterol as needed for chest tightness wheezing or shortness of breath  Anxiety with panic attacks: Continue follow-up with your psychiatrist If we find that lung function testing is normal then we will ask you to work with your psychiatrist for better control of panic attacks  We will see you back in 3 weeks or sooner if needed.

## 2022-05-23 NOTE — Progress Notes (Signed)
Performed Full PFT Today.   ?

## 2022-05-23 NOTE — Addendum Note (Signed)
Addended by: Loma Sousa on: 05/23/2022 10:49 AM   Modules accepted: Orders

## 2022-06-05 ENCOUNTER — Ambulatory Visit: Payer: Medicaid Other | Admitting: "Endocrinology

## 2022-06-08 ENCOUNTER — Telehealth: Payer: Medicaid Other | Admitting: Family Medicine

## 2022-06-08 ENCOUNTER — Encounter: Payer: Medicaid Other | Admitting: Family

## 2022-06-08 DIAGNOSIS — J019 Acute sinusitis, unspecified: Secondary | ICD-10-CM | POA: Diagnosis not present

## 2022-06-08 DIAGNOSIS — R0600 Dyspnea, unspecified: Secondary | ICD-10-CM | POA: Diagnosis not present

## 2022-06-08 DIAGNOSIS — B9689 Other specified bacterial agents as the cause of diseases classified elsewhere: Secondary | ICD-10-CM | POA: Diagnosis not present

## 2022-06-08 DIAGNOSIS — H6993 Unspecified Eustachian tube disorder, bilateral: Secondary | ICD-10-CM | POA: Diagnosis not present

## 2022-06-08 MED ORDER — AMOXICILLIN-POT CLAVULANATE 875-125 MG PO TABS: 1.0000 | ORAL_TABLET | Freq: Two times a day (BID) | ORAL | 0 refills | Status: AC

## 2022-06-08 NOTE — Progress Notes (Signed)
Virtual Visit Consent   Martha Hall, you are scheduled for a virtual visit with a [facility] provider today. Just as with appointments in the office, your consent must be obtained to participate. Your consent will be active for this visit and any virtual visit you may have with one of our providers in the next 365 days. If you have a MyChart account, a copy of this consent can be sent to you electronically.  As this is a virtual visit, video technology does not allow for your provider to perform a traditional examination. This may limit your provider's ability to fully assess your condition. If your provider identifies any concerns that need to be evaluated in person or the need to arrange testing (such as labs, EKG, etc.), we will make arrangements to do so. Although advances in technology are sophisticated, we cannot ensure that it will always work on either your end or our end. If the connection with a video visit is poor, the visit may have to be switched to a telephone visit. With either a video or telephone visit, we are not always able to ensure that we have a secure connection.  By engaging in this virtual visit, you consent to the provision of healthcare and authorize for your insurance to be billed (if applicable) for the services provided during this visit. Depending on your insurance coverage, you may receive a charge related to this service.  I need to obtain your verbal consent now. Are you willing to proceed with your visit today? Martha Hall has provided verbal consent on 06/08/2022 for a virtual visit (video or telephone). Martha Mayo, NP  Date: 06/08/2022 9:54 AM  Virtual Visit via Video Note   I, Martha Hall, connected with  Martha Hall  ([id number], December 25, 1995) on 06/08/22 at 10:00 AM EST by a video-enabled telemedicine application and verified that I am speaking with the correct person using two identifiers.  Location: Patient: Virtual Visit Location Patient:  Home Provider: Virtual Visit Location Provider: Home Office   I discussed the limitations of evaluation and management by telemedicine and the availability of in person appointments. The patient expressed understanding and agreed to proceed.    History of Present Illness: Martha Hall is a 27 y.o. who identifies as a female who was assigned female at birth, and is being seen today for ear pain with pulsation. Onset was 7-10 days ago- was only at night, but it now is consistent through day. Associated symptoms are headaches, and eye pain and soreness, no recent URI. Modifying factors are include- Tylenol PM but it did not help much, only with headache. Known sick contacts- NO Recent COVID and or Flu exposure- NO Denies congestion, chest pain, sore throat, fevers, or chills. Known asthmatic- shortness of breath at times.    Problems:  Patient Active Problem List   Diagnosis Date Noted   Moderate episode of recurrent major depressive disorder ([place]) 12/15/2021   PTSD (post-traumatic stress disorder) 12/15/2021   GAD (generalized anxiety disorder) 12/15/2021   Prediabetes 06/07/2021   Hospital discharge follow-up 02/04/2021   Choledocholithiasis    Cholelithiasis 01/25/2021   History of hydrocephalus 06/11/2019   History of blood clot in brain 05/28/2019   Alpha thalassemia silent carrier 05/12/2019   Class 3 severe obesity due to excess calories without serious comorbidity with body mass index (BMI) of 45.0 to 49.9 in adult ([place]) 04/30/2019    Allergies:  Allergies  Allergen Reactions   Metoclopramide Hives and Itching   Medications:  Current Outpatient Medications:    albuterol (PROVENTIL) (2.5 MG/3ML) 0.083% nebulizer solution, Take 3 mLs (2.5 mg total) by nebulization every 6 (six) hours as needed for wheezing or shortness of breath., Disp: 150 mL, Rfl: 1   albuterol (VENTOLIN HFA) 108 (90 Base) MCG/ACT inhaler, Inhale 2 puffs into the lungs every 6 (six) hours as needed for  wheezing or shortness of breath., Disp: 8 g, Rfl: 2   doxycycline (VIBRA-TABS) 100 MG tablet, Take 1 tablet (100 mg total) by mouth 2 (two) times daily. (Patient not taking: Reported on 05/23/2022), Disp: 20 tablet, Rfl: 0   fluticasone (FLOVENT HFA) 110 MCG/ACT inhaler, Inhale 2 puffs into the lungs in the morning and at bedtime., Disp: 12 each, Rfl: 12   fluticasone-salmeterol (ADVAIR HFA) 115-21 MCG/ACT inhaler, Inhale 2 puffs into the lungs 2 (two) times daily., Disp: 1 each, Rfl: 12   fluticasone-salmeterol (ADVAIR HFA) 115-21 MCG/ACT inhaler, Inhale 2 puffs into the lungs 2 (two) times daily., Disp: 1 each, Rfl: 12   gabapentin (NEURONTIN) 300 MG capsule, Take 1 capsule (300 mg total) by mouth 3 (three) times daily., Disp: 90 capsule, Rfl: 3   LORazepam (ATIVAN) 0.5 MG tablet, Take 1 tablet (0.5 mg total) by mouth every 8 (eight) hours as needed for anxiety. (Patient not taking: Reported on 05/23/2022), Disp: 5 tablet, Rfl: 0   metroNIDAZOLE (FLAGYL) 500 MG tablet, Take 1 tablet (500 mg total) by mouth 2 (two) times daily. (Patient not taking: Reported on 05/23/2022), Disp: 14 tablet, Rfl: 0   omeprazole (PRILOSEC) 20 MG capsule, TAKE 1 CAPSULE BY MOUTH EVERY DAY (Patient not taking: Reported on 03/12/2022), Disp: 90 capsule, Rfl: 0   prazosin (MINIPRESS) 1 MG capsule, Take 1 capsule (1 mg total) by mouth at bedtime., Disp: 30 capsule, Rfl: 3   QUEtiapine (SEROQUEL) 50 MG tablet, Take 1 tablet (50 mg total) by mouth daily., Disp: 30 tablet, Rfl: 3   sertraline (ZOLOFT) 50 MG tablet, Take 1 tablet (50 mg total) by mouth daily., Disp: 30 tablet, Rfl: 3   Spacer/Aero-Holding Chambers (AEROCHAMBER MV) inhaler, Use as instructed (Patient not taking: Reported on 05/23/2022), Disp: 1 each, Rfl: 0   SUMAtriptan (IMITREX) 25 MG tablet, Take 25 mg (1 tablet total) by mouth at the start of the headache. May repeat in 2 hours x 1 if headache persists. Max of 2 tablets/24 hours. (Patient taking differently: Take 25  mg by mouth every 2 (two) hours as needed for migraine.), Disp: 30 tablet, Rfl: 0  Observations/Objective: Patient is well-developed, well-nourished in no acute distress.  Resting comfortably  at home.  Head is normocephalic, atraumatic.  No labored breathing.  Speech is clear and coherent with logical content.  Patient is alert and oriented at baseline.    Assessment and Plan:  1. Acute bacterial sinusitis  - amoxicillin-clavulanate (AUGMENTIN) 875-125 MG tablet; Take 1 tablet by mouth 2 (two) times daily for 7 days.  Dispense: 14 tablet; Refill: 0  2. ETD (Eustachian tube dysfunction), bilateral  Flonase and get in with PCP for referral to ENT  -Take meds as prescribed -Rest -Use a cool mist humidifier especially during the winter months when heat dries out the air. - Use saline nose sprays frequently to help soothe nasal passages and promote drainage. -Saline irrigations of the nose can be very helpful if done frequently.             * 4X daily for 1 week*             *  Use of a nettie pot can be helpful with this.  *Follow directions with this* *Boiled or distilled water only -stay hydrated by drinking plenty of fluids - Keep thermostat turn down low to prevent drying out sinuses - For any cough or congestion- robitussin DM or Delsym as needed - For fever or aches or pains- take tylenol or ibuprofen as directed on bottle             * for fevers greater than 101 orally you may alternate ibuprofen and tylenol every 3 hours.  If you do not improve you will need a follow up visit in person.                 Reviewed side effects, risks and benefits of medication.    Patient acknowledged agreement and understanding of the plan.  Past Medical, Surgical, Social History, Allergies, and Medications have been Reviewed.    Follow Up Instructions: I discussed the assessment and treatment plan with the patient. The patient was provided an opportunity to ask questions and all  were answered. The patient agreed with the plan and demonstrated an understanding of the instructions.  A copy of instructions were sent to the patient via MyChart unless otherwise noted below.     The patient was advised to call back or seek an in-person evaluation if the symptoms worsen or if the condition fails to improve as anticipated.  Time:  I spent 10 minutes with the patient via telehealth technology discussing the above problems/concerns.    Martha Mayo, NP

## 2022-06-08 NOTE — Patient Instructions (Addendum)
Kajal Cheral Almas, thank you for joining [name], NP for today's virtual visit.  While this provider is not your primary care provider (PCP), if your PCP is located in our provider database this encounter information will be shared with them immediately following your visit.   [facility] account gives you access to today's visit and all your visits, tests, and labs performed at [facility] " click here if you don't have a [facility] account or go to [email or web address]  Consent: (Patient) Ashleah Uram provided verbal consent for this virtual visit at the beginning of the encounter.  Current Medications:  Current Outpatient Medications:    amoxicillin-clavulanate (AUGMENTIN) 875-125 MG tablet, Take 1 tablet by mouth 2 (two) times daily for 7 days., Disp: 14 tablet, Rfl: 0   albuterol (PROVENTIL) (2.5 MG/3ML) 0.083% nebulizer solution, Take 3 mLs (2.5 mg total) by nebulization every 6 (six) hours as needed for wheezing or shortness of breath., Disp: 150 mL, Rfl: 1   albuterol (VENTOLIN HFA) 108 (90 Base) MCG/ACT inhaler, Inhale 2 puffs into the lungs every 6 (six) hours as needed for wheezing or shortness of breath., Disp: 8 g, Rfl: 2   fluticasone-salmeterol (ADVAIR HFA) 115-21 MCG/ACT inhaler, Inhale 2 puffs into the lungs 2 (two) times daily., Disp: 1 each, Rfl: 12   fluticasone-salmeterol (ADVAIR HFA) 115-21 MCG/ACT inhaler, Inhale 2 puffs into the lungs 2 (two) times daily., Disp: 1 each, Rfl: 12   gabapentin (NEURONTIN) 300 MG capsule, Take 1 capsule (300 mg total) by mouth 3 (three) times daily., Disp: 90 capsule, Rfl: 3   prazosin (MINIPRESS) 1 MG capsule, Take 1 capsule (1 mg total) by mouth at bedtime., Disp: 30 capsule, Rfl: 3   QUEtiapine (SEROQUEL) 50 MG tablet, Take 1 tablet (50 mg total) by mouth daily., Disp: 30 tablet, Rfl: 3   sertraline (ZOLOFT) 50 MG tablet, Take 1 tablet (50 mg total) by mouth daily., Disp: 30 tablet, Rfl: 3    Spacer/Aero-Holding Chambers (AEROCHAMBER MV) inhaler, Use as instructed (Patient not taking: Reported on 05/23/2022), Disp: 1 each, Rfl: 0   SUMAtriptan (IMITREX) 25 MG tablet, Take 25 mg (1 tablet total) by mouth at the start of the headache. May repeat in 2 hours x 1 if headache persists. Max of 2 tablets/24 hours. (Patient taking differently: Take 25 mg by mouth every 2 (two) hours as needed for migraine.), Disp: 30 tablet, Rfl: 0   Medications ordered in this encounter:  Meds ordered this encounter  Medications   amoxicillin-clavulanate (AUGMENTIN) 875-125 MG tablet    Sig: Take 1 tablet by mouth 2 (two) times daily for 7 days.    Dispense:  14 tablet    Refill:  0    Order Specific Question:   Supervising Provider    Answer:   Chase Picket [id number]     *If you need refills on other medications prior to your next appointment, please contact your pharmacy*  Follow-Up: Call back or seek an in-person evaluation if the symptoms worsen or if the condition fails to improve as anticipated.  [place] [phone]  Other Instructions  -Follow up with PCP and get in ENT- for on going sinus and ear issues  Take meds as discussed be seen in person if not improving  -Flonase daily     If you have been instructed to have an in-person evaluation today at a local Urgent Care facility, please use the link below. It will take you to  a list of all of our available [facility], including address, phone number and hours of operation. Please do not delay care.  [facility]  If you or a family member do not have a primary care provider, use the link below to schedule a visit and establish care. When you choose a [facility] primary care physician or advanced practice provider, you gain a long-term partner in health. Find a Primary Care Provider  Learn more about [facility] in-office and virtual care options: [facility]

## 2022-06-12 ENCOUNTER — Encounter: Payer: Self-pay | Admitting: Pulmonary Disease

## 2022-06-13 ENCOUNTER — Ambulatory Visit: Payer: Medicaid Other | Admitting: Pulmonary Disease

## 2022-06-21 ENCOUNTER — Telehealth: Payer: Self-pay

## 2022-06-21 NOTE — Telephone Encounter (Signed)
PA request received via CMM for Fluticasone-Salmeterol 115-21MCG/ACT aerosol  PA has been submitted to [facility] and is pending determination.  Key: [id number]
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
## 2022-06-30 ENCOUNTER — Encounter: Payer: Self-pay | Admitting: Pulmonary Disease

## 2022-06-30 ENCOUNTER — Other Ambulatory Visit (HOSPITAL_COMMUNITY): Payer: Self-pay | Admitting: Psychiatry

## 2022-06-30 DIAGNOSIS — F4310 Post-traumatic stress disorder, unspecified: Secondary | ICD-10-CM

## 2022-06-30 NOTE — Telephone Encounter (Signed)
Please advise if there is any update on this for pt.

## 2022-07-03 ENCOUNTER — Other Ambulatory Visit (HOSPITAL_COMMUNITY): Payer: Self-pay

## 2022-07-03 NOTE — Telephone Encounter (Signed)
Medication is covered, PA not needed at this time.

## 2022-07-03 NOTE — Telephone Encounter (Signed)
Copied from [place] [id number]. Topic: Referral - Question >> Jun 30, 2022 11:44 AM Penni Bombard wrote: Reason for CRM: Pt called saying her GI doctor called her for a referral for liver.  She said she does not know why .  CB#  [phone]

## 2022-07-04 ENCOUNTER — Encounter: Payer: Self-pay | Admitting: "Endocrinology

## 2022-07-04 ENCOUNTER — Encounter: Payer: Medicaid Other | Admitting: Family

## 2022-07-04 ENCOUNTER — Ambulatory Visit (INDEPENDENT_AMBULATORY_CARE_PROVIDER_SITE_OTHER): Payer: Medicaid Other | Admitting: "Endocrinology

## 2022-07-04 VITALS — BP 104/78 | HR 84 | Ht 60.0 in | Wt 242.2 lb

## 2022-07-04 DIAGNOSIS — R61 Generalized hyperhidrosis: Secondary | ICD-10-CM | POA: Diagnosis not present

## 2022-07-04 DIAGNOSIS — E559 Vitamin D deficiency, unspecified: Secondary | ICD-10-CM | POA: Diagnosis not present

## 2022-07-04 DIAGNOSIS — R7303 Prediabetes: Secondary | ICD-10-CM

## 2022-07-04 DIAGNOSIS — L83 Acanthosis nigricans: Secondary | ICD-10-CM

## 2022-07-04 NOTE — Progress Notes (Unsigned)
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 Per consultation with Dr. Rosana Hoes, no further work-up necessary. Per MFM: " The patient was reassured that the anti-Lewis antibodies are usually of the IgM subtype and therefore we will not cross the placenta and cause fetal anemia.  The Lewis antibodies should therefore not affect the management of her current pregnancy."   Vaginal trichomoniasis 05/09/2019   04/30/2019 on Pap '[]'$ tx '[]'$ TOC- neg   Past Surgical History:  Procedure Laterality Date   BRAIN SURGERY  [birth year]   stint placed when born   CHOLECYSTECTOMY N/A 01/25/2021   Procedure: LAPAROSCOPIC CHOLECYSTECTOMY WITH ATTEMPTED CHOLANGIOGRAM;  Surgeon: Coralie Keens, MD;  Location: [place];  Service: General;  Laterality: N/A;   ERCP N/A 01/29/2021   Procedure: ENDOSCOPIC RETROGRADE CHOLANGIOPANCREATOGRAPHY (ERCP);  Surgeon: Gatha Mayer, MD;  Location: [facility] ENDOSCOPY;  Service: Endoscopy;  Laterality: N/A;   IR RADIOLOGIST EVAL & MGMT  03/23/2021   REMOVAL OF STONES  01/29/2021   Procedure: REMOVAL OF STONES;  Surgeon: Gatha Mayer, MD;  Location: [place];  Service: Endoscopy;;   SPHINCTEROTOMY  01/29/2021   Procedure: Joan Mayans;  Surgeon: Gatha Mayer, MD;  Location: [facility] ENDOSCOPY;  Service: Endoscopy;;   Social History   Socioeconomic History   Marital status: Single    Spouse name: Not on file   Number of children: Not on file   Years of education: Not on file   Highest education level: Associate degree: occupational, Hotel manager, or  vocational program  Occupational History   Occupation: Customer Service    Comment: [employer]  Tobacco Use   Smoking status: Never    Passive exposure: Never   Smokeless tobacco: Never  Vaping Use   Vaping Use: Never used  Substance and Sexual Activity   Alcohol use: Never   Drug use: Never   Sexual activity: Not Currently    Comment: undecided  Other Topics Concern   Not on file  Social History Narrative   Not on file   Social Determinants of Health   Financial Resource Strain: High Risk (12/15/2021)   Overall Financial Resource Strain (CARDIA)    Difficulty of Paying Living Expenses: Hard  Food Insecurity: No Food Insecurity (12/15/2021)   Hunger Vital Sign    Worried About Running Out of Food in the Last Year: Never true    Ran Out of Food in the Last Year: Never true  Transportation Needs: Unmet Transportation Needs (12/15/2021)   PRAPARE - Transportation    Lack of Transportation (Medical): Yes    Lack of Transportation (Non-Medical): Yes  Physical Activity: Inactive (12/15/2021)   Exercise Vital Sign    Days of Exercise per Week: 0 days    Minutes of Exercise per Session: 0 min  Stress: Stress Concern Present (12/15/2021)  [employer] - Occupational Stress Questionnaire    Feeling of Stress : Very much  Social Connections: Socially Isolated (12/15/2021)   Social Connection and Isolation Panel [NHANES]    Frequency of Communication with Friends and Family: More than three times a week    Frequency of Social Gatherings with Friends and Family: Never    Attends Religious Services: Never    Marine scientist or Organizations: No    Attends Music therapist: Never    Marital Status: Never married   Family History  Problem Relation Age of Onset   Healthy Mother    Healthy Father    Outpatient Encounter Medications as of 07/04/2022  Medication Sig   albuterol (PROVENTIL) (2.5 MG/3ML) 0.083% nebulizer solution Take 3 mLs  (2.5 mg total) by nebulization every 6 (six) hours as needed for wheezing or shortness of breath.   albuterol (VENTOLIN HFA) 108 (90 Base) MCG/ACT inhaler Inhale 2 puffs into the lungs every 6 (six) hours as needed for wheezing or shortness of breath.   fluticasone-salmeterol (ADVAIR HFA) 115-21 MCG/ACT inhaler Inhale 2 puffs into the lungs 2 (two) times daily.   fluticasone-salmeterol (ADVAIR HFA) 115-21 MCG/ACT inhaler Inhale 2 puffs into the lungs 2 (two) times daily.   gabapentin (NEURONTIN) 300 MG capsule Take 1 capsule (300 mg total) by mouth 3 (three) times daily.   prazosin (MINIPRESS) 1 MG capsule Take 1 capsule (1 mg total) by mouth at bedtime.   QUEtiapine (SEROQUEL) 50 MG tablet Take 1 tablet (50 mg total) by mouth daily.   sertraline (ZOLOFT) 50 MG tablet Take 1 tablet (50 mg total) by mouth daily.   Spacer/Aero-Holding Chambers (AEROCHAMBER MV) inhaler Use as instructed (Patient not taking: Reported on 05/23/2022)   [DISCONTINUED] diphenhydrAMINE (BENADRYL) 25 MG tablet Take 12.5 mg by mouth every 6 (six) hours as needed for allergies. (Patient not taking: Reported on 12/30/2019)   [DISCONTINUED] SUMAtriptan (IMITREX) 25 MG tablet Take 25 mg (1 tablet total) by mouth at the start of the headache. May repeat in 2 hours x 1 if headache persists. Max of 2 tablets/24 hours. (Patient taking differently: Take 25 mg by mouth every 2 (two) hours as needed for migraine.)   No facility-administered encounter medications on file as of 07/04/2022.   ALLERGIES: Allergies  Allergen Reactions   Metoclopramide Hives and Itching    VACCINATION STATUS: Immunization History  Administered Date(s) Administered   HPV 9-valent 06/06/2021   Influenza-Unspecified 03/08/2022   PFIZER(Purple Top)SARS-COV-2 Vaccination 09/29/2019, 10/21/2019   Tdap 09/04/2019    [name] is 27 y.o. female who presents today with a medical history as above. she is being seen in consultation for hyperhidrosis  requested by Camillia Herter, NP.  Patient is accompanied by her sister to clinic.  She is a suboptimal historian.  she has been dealing with symptoms of  " excessively sweats" " all the time". She associates the symptoms with gallbladder surgery she had in September 2022. Patient came wearing layers to clinic despite her complaints of excessive sweats. She has not identified any triggers or food, specific activity  or time of the day for her sweats. She denies any prior history of thyroid, adrenal dysfunction. Her current medications include bronchodilators due to her asthma, gabapentin, Seroquel, Zoloft, prazosin. Labs from December 2023 showed normal CBC, no documented evaluation for endocrine dysfunctions.   Review of Systems  Constitutional: + Minimally fluctuating body weight, currently BMI of 47.3,  no fatigue, no  subjective hyperthermia, no subjective hypothermia Eyes: no blurry vision, no xerophthalmia ENT: no sore throat, no nodules palpated in throat, no dysphagia/odynophagia, no hoarseness Cardiovascular: no Chest Pain, no Shortness of Breath, no palpitations, no leg swelling Respiratory: no cough, no shortness of breath Gastrointestinal: no Nausea/Vomiting/Diarhhea Musculoskeletal: no muscle/joint aches Skin: no rashes, + sweats Neurological: no tremors, no numbness, no tingling, no dizziness Psychiatric: no depression, no anxiety  Objective:       07/04/2022   12:59 PM 05/23/2022   10:01 AM 05/17/2022    9:27 AM  Vitals with BMI  Height '5\' 0"'$  '5\' 0"'$    Weight 242 lbs 3 oz 244 lbs   BMI 123456 123456   Systolic 123456 [id number]  Diastolic 78 76 76  Pulse 84 96 89    BP 104/78   Pulse 84   Ht 5' (1.524 m)   Wt 242 lb 3.2 oz (109.9 kg)   BMI 47.30 kg/m   Wt Readings from Last 3 Encounters:  07/04/22 242 lb 3.2 oz (109.9 kg)  05/23/22 244 lb (110.7 kg)  05/06/22 244 lb 11.4 oz (111 kg)    Physical Exam  Constitutional:  Body mass index is 47.3 kg/m.,  not in acute  distress, normal state of mind Eyes: PERRLA, EOMI, no exophthalmos ENT: moist mucous membranes, no gross thyromegaly, no gross cervical lymphadenopathy Cardiovascular: normal precordial activity, Regular Rate and Rhythm, no Murmur/Rubs/Gallops Respiratory:  adequate breathing efforts, no gross chest deformity, Clear to auscultation bilaterally Gastrointestinal: abdomen soft, Non -tender, No distension, Bowel Sounds present, no gross organomegaly Musculoskeletal: no gross deformities, strength intact in all four extremities Skin: moist, warm, no rashes, + acanthosis nigricans Neurological: no tremor with outstretched hands, Deep tendon reflexes normal in bilateral lower extremities.  CMP ( most recent) CMP     Component Value Date/Time   NA 141 09/29/2021 1250   K 4.2 09/29/2021 1250   CL 112 (H) 09/29/2021 1250   CO2 24 09/29/2021 1250   GLUCOSE 92 09/29/2021 1250   BUN 9 09/29/2021 1250   CREATININE 0.78 09/29/2021 1250   CALCIUM 9.1 09/29/2021 1250   PROT 6.5 02/04/2021 1153   ALBUMIN 2.8 (L) 02/04/2021 1153   AST 26 02/04/2021 1153   ALT 18 06/06/2021 1134   ALKPHOS 109 02/04/2021 1153   BILITOT 1.9 (H) 02/04/2021 1153   GFRNONAA >60 09/29/2021 1250   GFRAA >60 11/16/2019 0950     Diabetic Labs (most recent): Lab Results  Component Value Date   HGBA1C 5.9 (H) 06/06/2021   HGBA1C 5.6 04/30/2019     Lipid Panel ( most recent) Lipid Panel     Component Value Date/Time   CHOL 132 06/06/2021 1134   TRIG 97 06/06/2021 1134   HDL 53 06/06/2021 1134   CHOLHDL 2.5 06/06/2021 1134   LDLCALC 61 06/06/2021 1134   LABVLDL 18 06/06/2021 1134    Assessment & Plan:   1. Hyperhidrosis  - Martha Hall  is being seen at a kind request of Camillia Herter, NP.   - I have reviewed her available endocrine records and clinically evaluated the patient. At this time, she does not have clear endocrine dysfunction to cause hyperhidrosis.  She will be assessed with workup including  thyroid function test, and 24-hour urine collection to measure metanephrines/catecholamines. Pretest probability for pheochromocytoma is very low. The likely cause of her hyperhidrosis seems to be her medications including Seroquel and Zoloft even though she does not associate these medications to the onset of  her sweating problem.    This patient's major problem seems to be her metabolic syndrome and risk for hyperinsulinemia/insulin resistance and diabetes.  She has acanthosis nigricans and morbid obesity with BMI of 46.87 at age 51.  She is a good candidate for lifestyle medicine which will be discussed during her next visit.  - I did not initiate any new prescriptions today. - she is advised to maintain close follow up with Camillia Herter, NP for primary care needs.   - Time spent with the patient: 45 minutes, of which >50% was spent in  counseling her about her reported hyperhidrosis, and the rest in obtaining information about her symptoms, reviewing her previous labs/studies ( including abstractions from other facilities),  evaluations, and treatments,  and developing a plan to confirm diagnosis and long term treatment based on the latest standards of care/guidelines; and documenting her care.  Martha Hall participated in the discussions, expressed understanding, and voiced agreement with the above plans.  All questions were answered to her satisfaction. she is encouraged to contact clinic should she have any questions or concerns prior to her return visit.  Follow up plan: Return in about 2 weeks (around 07/18/2022), or and 24 hour urine studies, for Labs Today- Non-Fasting Ok.   Glade Lloyd, MD [facility] [address] [place], [place] [address] Phone: [phone]  Fax: [phone]     07/04/2022, 5:38 PM  This note was partially dictated with voice recognition software. Similar sounding words can be transcribed  inadequately or may not  be corrected upon review.

## 2022-07-05 ENCOUNTER — Encounter: Payer: Self-pay | Admitting: Family

## 2022-07-05 ENCOUNTER — Ambulatory Visit (INDEPENDENT_AMBULATORY_CARE_PROVIDER_SITE_OTHER): Payer: Medicaid Other | Admitting: Family

## 2022-07-05 VITALS — BP 114/80 | HR 69 | Temp 98.3°F | Resp 16 | Ht 60.0 in | Wt 240.0 lb

## 2022-07-05 DIAGNOSIS — Z13220 Encounter for screening for lipoid disorders: Secondary | ICD-10-CM | POA: Diagnosis not present

## 2022-07-05 DIAGNOSIS — L83 Acanthosis nigricans: Secondary | ICD-10-CM | POA: Insufficient documentation

## 2022-07-05 DIAGNOSIS — Z13228 Encounter for screening for other metabolic disorders: Secondary | ICD-10-CM

## 2022-07-05 DIAGNOSIS — H938X3 Other specified disorders of ear, bilateral: Secondary | ICD-10-CM | POA: Diagnosis not present

## 2022-07-05 DIAGNOSIS — Z130 Encounter for screening for diseases of the blood and blood-forming organs and certain disorders involving the immune mechanism: Secondary | ICD-10-CM | POA: Diagnosis not present

## 2022-07-05 DIAGNOSIS — R7303 Prediabetes: Secondary | ICD-10-CM | POA: Diagnosis not present

## 2022-07-05 DIAGNOSIS — Z0001 Encounter for general adult medical examination with abnormal findings: Secondary | ICD-10-CM

## 2022-07-05 DIAGNOSIS — Z1329 Encounter for screening for other suspected endocrine disorder: Secondary | ICD-10-CM

## 2022-07-05 DIAGNOSIS — Z0000 Encounter for general adult medical examination without abnormal findings: Secondary | ICD-10-CM

## 2022-07-05 LAB — TSH: TSH: 1.09 u[IU]/mL (ref 0.450–4.500)

## 2022-07-05 LAB — T4, FREE: Free T4: 1.22 ng/dL (ref 0.82–1.77)

## 2022-07-05 LAB — VITAMIN D 25 HYDROXY (VIT D DEFICIENCY, FRACTURES): Vit D, 25-Hydroxy: 11.7 ng/mL — ABNORMAL LOW (ref 30.0–100.0)

## 2022-07-05 LAB — T3, FREE: T3, Free: 3 pg/mL (ref 2.0–4.4)

## 2022-07-05 NOTE — Patient Instructions (Signed)

## 2022-07-05 NOTE — Progress Notes (Signed)
.  Pt presents for annual physical exam  

## 2022-07-06 DIAGNOSIS — R61 Generalized hyperhidrosis: Secondary | ICD-10-CM | POA: Diagnosis not present

## 2022-07-06 LAB — LIPID PANEL
Chol/HDL Ratio: 2.6 ratio (ref 0.0–4.4)
Cholesterol, Total: 141 mg/dL (ref 100–199)
HDL: 55 mg/dL (ref >39)
LDL Chol Calc (NIH): 71 mg/dL (ref 0–99)
Triglycerides: 74 mg/dL (ref 0–149)
VLDL Cholesterol Cal: 15 mg/dL (ref 5–40)

## 2022-07-06 LAB — CMP14+EGFR
ALT: 22 IU/L (ref 0–32)
AST: 19 IU/L (ref 0–40)
Albumin/Globulin Ratio: 1.9 (ref 1.2–2.2)
Albumin: 4.5 g/dL (ref 4.0–5.0)
Alkaline Phosphatase: 62 IU/L (ref 44–121)
BUN/Creatinine Ratio: 16 (ref 9–23)
BUN: 11 mg/dL (ref 6–20)
Bilirubin Total: 0.2 mg/dL (ref 0.0–1.2)
CO2: 22 mmol/L (ref 20–29)
Calcium: 9.4 mg/dL (ref 8.7–10.2)
Chloride: 105 mmol/L (ref 96–106)
Creatinine, Ser: 0.7 mg/dL (ref 0.57–1.00)
Globulin, Total: 2.4 g/dL (ref 1.5–4.5)
Glucose: 84 mg/dL (ref 70–99)
Potassium: 4.4 mmol/L (ref 3.5–5.2)
Sodium: 140 mmol/L (ref 134–144)
Total Protein: 6.9 g/dL (ref 6.0–8.5)
eGFR: 124 mL/min/{1.73_m2} (ref >59)

## 2022-07-06 LAB — CBC
Hematocrit: 38.9 % (ref 34.0–46.6)
Hemoglobin: 12.9 g/dL (ref 11.1–15.9)
MCH: 28.2 pg (ref 26.6–33.0)
MCHC: 33.2 g/dL (ref 31.5–35.7)
MCV: 85 fL (ref 79–97)
Platelets: 344 10*3/uL (ref 150–450)
RBC: 4.58 x10E6/uL (ref 3.77–5.28)
RDW: 13.6 % (ref 11.7–15.4)
WBC: 5.5 10*3/uL (ref 3.4–10.8)

## 2022-07-06 LAB — HEMOGLOBIN A1C
Est. average glucose Bld gHb Est-mCnc: 120 mg/dL
Hgb A1c MFr Bld: 5.8 % — ABNORMAL HIGH (ref 4.8–5.6)

## 2022-07-07 DIAGNOSIS — R0600 Dyspnea, unspecified: Secondary | ICD-10-CM | POA: Diagnosis not present

## 2022-07-08 ENCOUNTER — Ambulatory Visit
Admission: EM | Admit: 2022-07-08 | Discharge: 2022-07-08 | Disposition: A | Discharge status: Home / Self Care | Payer: Medicaid Other | Attending: Emergency Medicine | Admitting: Emergency Medicine

## 2022-07-08 DIAGNOSIS — J0141 Acute recurrent pansinusitis: Secondary | ICD-10-CM | POA: Diagnosis not present

## 2022-07-08 HISTORY — DX: Vitamin D deficiency, unspecified: E55.9

## 2022-07-08 MED ORDER — PREDNISONE 20 MG PO TABS: 40.0000 mg | ORAL_TABLET | Freq: Every day | ORAL | 0 refills | Status: DC

## 2022-07-08 MED ORDER — FEXOFENADINE-PSEUDOEPHED ER 60-120 MG PO TB12: 1.0000 | ORAL_TABLET | Freq: Two times a day (BID) | ORAL | 1 refills | Status: AC

## 2022-07-08 MED ORDER — IPRATROPIUM BROMIDE 0.03 % NA SOLN: 2.0000 | Freq: Two times a day (BID) | NASAL | 12 refills | Status: DC

## 2022-07-08 NOTE — Discharge Instructions (Signed)
Symptoms today are most likely related to your sinuses, as you have used antibiotics multiple times with no improvement the is most likely not bacteria contributing to your symptoms  I Believe you need follow-up with the specialist, information is listed on front page, you may call their office and set an appointment, it may take a few months for you to be get in and be seen, take the first available appointment and then checking to see if he can be seen sooner  Begin prednisone every morning with food for 5 days to help relax the airway and for comfort, you may take Tylenol in addition to this  Begin use of ipratropium nasal spray taking every morning and every evening to help reduce sinus congestion and to open the airway  Begin use of Allegra-D which is an allergy medicine mix with a decongestant to further help minimize your symptoms, stop your Claritin  Begin use of Mucinex daily to help thin out secretions     For cough: honey 1/2 to 1 teaspoon (you can dilute the honey in water or another fluid).  You can also use guaifenesin and dextromethorphan for cough. You can use a humidifier for chest congestion and cough.  If you don't have a humidifier, you can sit in the bathroom with the hot shower running.      It is important to stay hydrated: drink plenty of fluids (water, gatorade/powerade/pedialyte, juices, or teas) to keep your throat moisturized and help further relieve irritation/discomfort.

## 2022-07-08 NOTE — ED Provider Notes (Signed)
[name]    CSN: [id number] Arrival date & time: 07/08/22  1122      History   Chief Complaint No chief complaint on file.   [name] is a 27 y.o. female.   Patient presents for evaluation of nasal congestion, rhinorrhea, sore throat, cough, shortness of breath and wheezing flared for the last 5 days.  Has been experiencing wheezing overnight, has shortness of breath with exertion at baseline, slightly worsened.  Cough is productive and noticed spots of blood in the mucus today.  Endorses that she has had persistent congestion and rhinorrhea and sinus pain and pressure since November 2023, has completed several antibiotic courses with the last course completed at the beginning of February.  Believes symptoms are triggered by the weather. Has tried tylenol, sinus, flonase, loratadine.     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 Per consultation with Dr. Rosana Hoes, no further work-up necessary. Per MFM: " The patient was reassured that the anti-Lewis antibodies are usually of the IgM subtype and therefore we will not cross the placenta and cause fetal anemia.  The Lewis antibodies should therefore not affect the management of her current pregnancy."   Vaginal trichomoniasis 05/09/2019   04/30/2019 on Pap '[]'$ tx '[]'$ TOC- neg   Vitamin D deficiency     Patient Active Problem List   Diagnosis Date Noted   Acanthosis nigricans 07/05/2022   Hyperhidrosis 07/04/2022   Moderate episode of recurrent major depressive disorder ([place]) 12/15/2021   PTSD (post-traumatic stress disorder) 12/15/2021   GAD (generalized anxiety disorder) 12/15/2021   Prediabetes 06/07/2021   Hospital discharge follow-up 02/04/2021   Choledocholithiasis     Cholelithiasis 01/25/2021   History of hydrocephalus 06/11/2019   History of blood clot in brain 05/28/2019   Alpha thalassemia silent carrier 05/12/2019   Morbid obesity ([place]) 04/30/2019    Past Surgical History:  Procedure Laterality Date   BRAIN SURGERY  02/21/1996   stint placed when born   CHOLECYSTECTOMY N/A 01/25/2021   Procedure: LAPAROSCOPIC CHOLECYSTECTOMY WITH ATTEMPTED CHOLANGIOGRAM;  Surgeon: Coralie Keens, MD;  Location: [place];  Service: General;  Laterality: N/A;   ERCP N/A 01/29/2021   Procedure: ENDOSCOPIC RETROGRADE CHOLANGIOPANCREATOGRAPHY (ERCP);  Surgeon: Gatha Mayer, MD;  Location: [facility] ENDOSCOPY;  Service: Endoscopy;  Laterality: N/A;   IR RADIOLOGIST EVAL & MGMT  03/23/2021   REMOVAL OF STONES  01/29/2021   Procedure: REMOVAL OF STONES;  Surgeon: Gatha Mayer, MD;  Location: [place];  Service: Endoscopy;;   SPHINCTEROTOMY  01/29/2021   Procedure: Joan Mayans;  Surgeon: Gatha Mayer, MD;  Location[facility] ENDOSCOPY;  Service: Endoscopy;;    OB History     Gravida  1   Para  1   Term  1   Preterm      AB  0   Living  1      SAB  0   IAB      Ectopic      Multiple  0   Live Births  1            Home Medications    Prior to Admission medications   Medication Sig Start Date End Date Taking? Authorizing Provider  albuterol (PROVENTIL) (2.5  MG/3ML) 0.083% nebulizer solution Take 3 mLs (2.5 mg total) by nebulization every 6 (six) hours as needed for wheezing or shortness of breath. 04/04/22   Camillia Herter, NP  albuterol (VENTOLIN HFA) 108 (90 Base) MCG/ACT inhaler Inhale 2 puffs into the lungs every 6 (six) hours as needed for wheezing or shortness of breath. 04/04/22   Camillia Herter, NP  fluticasone-salmeterol (ADVAIR HFA) [phone] MCG/ACT inhaler Inhale 2 puffs into the lungs 2 (two) times daily. 05/23/22   Juanito Doom, MD  fluticasone-salmeterol (ADVAIR HFA) [id number] MCG/ACT inhaler Inhale 2 puffs into the lungs 2  (two) times daily. 05/23/22   Juanito Doom, MD  gabapentin (NEURONTIN) 300 MG capsule Take 1 capsule (300 mg total) by mouth 3 (three) times daily. 03/09/22   Salley Slaughter, NP  prazosin (MINIPRESS) 1 MG capsule Take 1 capsule (1 mg total) by mouth at bedtime. 03/09/22   Salley Slaughter, NP  QUEtiapine (SEROQUEL) 50 MG tablet Take 1 tablet (50 mg total) by mouth daily. 03/09/22   Salley Slaughter, NP  sertraline (ZOLOFT) 50 MG tablet Take 1 tablet (50 mg total) by mouth daily. 03/09/22   Salley Slaughter, NP  Spacer/Aero-Holding Josiah Lobo (AEROCHAMBER MV) inhaler Use as instructed Patient not taking: Reported on 05/23/2022 04/12/22   Juanito Doom, MD  diphenhydrAMINE (BENADRYL) 25 MG tablet Take 12.5 mg by mouth every 6 (six) hours as needed for allergies. Patient not taking: Reported on 12/30/2019  06/21/20  [provider]    Family History Family History  Problem Relation Age of Onset   Healthy Mother    Healthy Father     Social History Social History   Tobacco Use   Smoking status: Never    Passive exposure: Never   Smokeless tobacco: Never  Vaping Use   Vaping Use: Never used  Substance Use Topics   Alcohol use: Never   Drug use: Never     Allergies   Metoclopramide   Review of Systems Review of Systems  Constitutional: Negative.   HENT:  Positive for congestion, rhinorrhea and sore throat. Negative for dental problem, drooling, ear discharge, ear pain, facial swelling, hearing loss, mouth sores, nosebleeds, postnasal drip, sinus pressure, sinus pain, sneezing, tinnitus, trouble swallowing and voice change.   Respiratory:  Positive for cough, shortness of breath and wheezing. Negative for apnea, choking, chest tightness and stridor.   Cardiovascular: Negative.   Gastrointestinal: Negative.   Skin: Negative.   Neurological: Negative.      Physical Exam Triage Vital Signs ED Triage Vitals  Enc Vitals Group     BP 07/08/22 1204 104/72      Pulse Rate 07/08/22 1204 81     Resp 07/08/22 1204 20     Temp 07/08/22 1204 98.2 F (36.8 C)     Temp Source 07/08/22 1204 Oral     SpO2 07/08/22 1204 97 %     Weight --      Height --      Head Circumference --      Peak Flow --      Pain Score 07/08/22 1205 7     Pain Loc --      Pain Edu? --      Excl. in [place]? --    No data found.  Updated Vital Signs BP 104/72 (BP Location: Right Arm)   Pulse 81   Temp 98.2 F (36.8 C) (Oral)   Resp 20   LMP 07/02/2022 (Approximate)  SpO2 97%   Visual Acuity Right Eye Distance:   Left Eye Distance:   Bilateral Distance:    Right Eye Near:   Left Eye Near:    Bilateral Near:     Physical Exam Constitutional:      Appearance: Normal appearance.  HENT:     Right Ear: Tympanic membrane, ear canal and external ear normal.     Left Ear: Tympanic membrane, ear canal and external ear normal.     Nose: Congestion and rhinorrhea present.     Right Turbinates: Swollen.     Left Turbinates: Swollen.     Right Sinus: Maxillary sinus tenderness and frontal sinus tenderness present.     Left Sinus: Maxillary sinus tenderness and frontal sinus tenderness present.  Eyes:     Extraocular Movements: Extraocular movements intact.  Cardiovascular:     Rate and Rhythm: Normal rate and regular rhythm.     Pulses: Normal pulses.     Heart sounds: Normal heart sounds.  Pulmonary:     Effort: Pulmonary effort is normal.     Breath sounds: Normal breath sounds.  Skin:    General: Skin is warm and dry.  Neurological:     Mental Status: She is alert and oriented to person, place, and time. Mental status is at baseline.  Psychiatric:        Mood and Affect: Mood normal.        Behavior: Behavior normal.      UC Treatments / Results  Labs (all labs ordered are listed, but only abnormal results are displayed) Labs Reviewed - No data to display  EKG   Radiology No results found.  Procedures Procedures (including critical care  time)  Medications Ordered in UC Medications - No data to display  Initial Impression / Assessment and Plan / UC Course  I have reviewed the triage vital signs and the nursing notes.  Pertinent labs & imaging results that were available during my care of the patient were reviewed by me and considered in my medical decision making (see chart for details).  Recurrent pansinusitis  Presentation is consistent with sinusitis, as patient is already taken 3 courses of antibiotics will not attempt additional course, low suspicion of bacterial involvement at this time, patient will need follow-up with ear nose and throat specialist for further evaluation and management, referral given, prescribed prednisone for 5 days to temporarily provide relief, change nasal spray from fluticasone to ipratropium as well as allergy medicine from loratadine to likely begin, advised consistent use of Mucinex for additional support, may follow-up with her pulmonologist for further management of shortness of breath and wheezing if it continues to persist, may follow-up with his urgent care as needed Final Clinical Impressions(s) / UC Diagnoses   Final diagnoses:  None   Discharge Instructions   None    ED Prescriptions   None    PDMP not reviewed this encounter.   Hans Eden, NP 07/08/22 1347

## 2022-07-08 NOTE — ED Triage Notes (Signed)
Pt reports her nose is swollen, face pain vomit with blood in it, severe head pain, and breathing problems due to asthma x 5 days.

## 2022-07-11 DIAGNOSIS — J343 Hypertrophy of nasal turbinates: Secondary | ICD-10-CM | POA: Diagnosis not present

## 2022-07-11 DIAGNOSIS — R519 Headache, unspecified: Secondary | ICD-10-CM | POA: Diagnosis not present

## 2022-07-11 LAB — METANEPHRINES, URINE, 24 HOUR
Metaneph Total, Ur: 139 ug/L
Metanephrines, 24H Ur: 111 ug/24 hr (ref 36–209)
Normetanephrine, 24H Ur: 234 ug/24 hr (ref 95–449)
Normetanephrine, Ur: 292 ug/L

## 2022-07-12 DIAGNOSIS — R519 Headache, unspecified: Secondary | ICD-10-CM | POA: Diagnosis not present

## 2022-07-12 DIAGNOSIS — R61 Generalized hyperhidrosis: Secondary | ICD-10-CM | POA: Diagnosis not present

## 2022-07-12 LAB — 5 HIAA W/CREATININE, 24 HR
Creatinine, 24H Ur: 1775 mg/24 hr (ref 800–1800)
Creatinine, Urine: 221.9 mg/dL

## 2022-07-13 ENCOUNTER — Other Ambulatory Visit: Payer: Self-pay | Admitting: "Endocrinology

## 2022-07-13 ENCOUNTER — Encounter: Payer: Self-pay | Admitting: "Endocrinology

## 2022-07-13 LAB — CATECHOLAMINES, FRACTIONATED, URINE, 24 HOUR
Dopamine , 24H Ur: 406 ug/24 hr (ref 0–510)
Dopamine, Rand Ur: 508 ug/L
Epinephrine, 24H Ur: 3 ug/24 hr (ref 0–20)
Epinephrine, Rand Ur: 4 ug/L
Norepinephrine, 24H Ur: 36 ug/24 hr (ref 0–135)
Norepinephrine, Rand Ur: 45 ug/L

## 2022-07-13 MED ORDER — VITAMIN D (ERGOCALCIFEROL) 1.25 MG (50000 UNIT) PO CAPS: 50000.0000 [IU] | ORAL_CAPSULE | ORAL | 0 refills | Status: DC

## 2022-07-24 ENCOUNTER — Ambulatory Visit: Payer: Medicaid Other | Admitting: "Endocrinology

## 2022-07-30 ENCOUNTER — Encounter: Payer: Self-pay | Admitting: "Endocrinology

## 2022-07-30 DIAGNOSIS — E7800 Pure hypercholesterolemia, unspecified: Secondary | ICD-10-CM | POA: Diagnosis not present

## 2022-07-30 DIAGNOSIS — R635 Abnormal weight gain: Secondary | ICD-10-CM | POA: Diagnosis not present

## 2022-07-30 DIAGNOSIS — Z0130 Encounter for examination of blood pressure without abnormal findings: Secondary | ICD-10-CM | POA: Diagnosis not present

## 2022-07-30 DIAGNOSIS — Z3200 Encounter for pregnancy test, result unknown: Secondary | ICD-10-CM | POA: Diagnosis not present

## 2022-07-30 DIAGNOSIS — R0602 Shortness of breath: Secondary | ICD-10-CM | POA: Diagnosis not present

## 2022-07-30 DIAGNOSIS — R5383 Other fatigue: Secondary | ICD-10-CM | POA: Diagnosis not present

## 2022-07-30 DIAGNOSIS — R5381 Other malaise: Secondary | ICD-10-CM | POA: Diagnosis not present

## 2022-07-30 DIAGNOSIS — F32A Depression, unspecified: Secondary | ICD-10-CM | POA: Diagnosis not present

## 2022-07-30 DIAGNOSIS — E559 Vitamin D deficiency, unspecified: Secondary | ICD-10-CM | POA: Diagnosis not present

## 2022-07-30 DIAGNOSIS — Z6841 Body Mass Index (BMI) 40.0 and over, adult: Secondary | ICD-10-CM | POA: Diagnosis not present

## 2022-07-30 DIAGNOSIS — F419 Anxiety disorder, unspecified: Secondary | ICD-10-CM | POA: Diagnosis not present

## 2022-08-03 ENCOUNTER — Ambulatory Visit: Payer: Medicaid Other | Admitting: Nurse Practitioner

## 2022-08-04 ENCOUNTER — Emergency Department (HOSPITAL_COMMUNITY)
Admission: EM | Admit: 2022-08-04 | Discharge: 2022-08-04 | Disposition: A | Discharge status: Home / Self Care | Payer: Medicaid Other | Attending: Emergency Medicine | Admitting: Emergency Medicine

## 2022-08-04 ENCOUNTER — Other Ambulatory Visit: Payer: Self-pay

## 2022-08-04 DIAGNOSIS — N644 Mastodynia: Secondary | ICD-10-CM | POA: Insufficient documentation

## 2022-08-04 LAB — URINALYSIS, ROUTINE W REFLEX MICROSCOPIC
Bilirubin Urine: NEGATIVE
Glucose, UA: NEGATIVE mg/dL
Hgb urine dipstick: NEGATIVE
Ketones, ur: NEGATIVE mg/dL
Leukocytes,Ua: NEGATIVE
Nitrite: NEGATIVE
Protein, ur: NEGATIVE mg/dL
Specific Gravity, Urine: 1.027 (ref 1.005–1.030)
pH: 6 (ref 5.0–8.0)

## 2022-08-04 LAB — PREGNANCY, URINE: Preg Test, Ur: NEGATIVE

## 2022-08-04 NOTE — Discharge Instructions (Signed)
Suspect pain fibrocystic breast changes, I recommend NSAIDs like ibuprofen, naproxen, please take this daily for the next 2 weeks, recommend supportive bras which will also help decrease pain.  Please follow-up with OB/GYN for further evaluation  Come back to the emergency department if you develop chest pain, shortness of breath, severe abdominal pain, uncontrolled nausea, vomiting, diarrhea.

## 2022-08-04 NOTE — ED Provider Notes (Signed)
[place] EMERGENCY DEPARTMENT AT [facility] Provider Note   CSN: [id number] Arrival date & time: 08/04/22  0003     History  Chief Complaint  Patient presents with   Breast Pain    Martha Hall is a 27 y.o. female.  HPI   Patient with medical history including hydrocephalus status post VP shunt, presenting with complaints of bilateral breast pain, states that started about 2 weeks ago, states its constant, states both breasts are hurting all over, states she does admit to to some whitish discharge, no trauma to the area, she is never had this happen to her in the past, states that she is currently on birth control, her last menstrual cycle was 2 weeks ago, she has not tried anything over-the-counter for pain.  States she is following with her OB/GYN in 2 weeks for further evaluation.  Home Medications Prior to Admission medications   Medication Sig Start Date End Date Taking? Authorizing Provider  albuterol (PROVENTIL) (2.5 MG/3ML) 0.083% nebulizer solution Take 3 mLs (2.5 mg total) by nebulization every 6 (six) hours as needed for wheezing or shortness of breath. 04/04/22  Yes Minette Brine, Amy J, NP  albuterol (VENTOLIN HFA) 108 (90 Base) MCG/ACT inhaler Inhale 2 puffs into the lungs every 6 (six) hours as needed for wheezing or shortness of breath. 04/04/22  Yes Minette Brine, Amy J, NP  fluticasone-salmeterol (ADVAIR HFA) [id number] MCG/ACT inhaler Inhale 2 puffs into the lungs 2 (two) times daily. 05/23/22  Yes Juanito Doom, MD  gabapentin (NEURONTIN) 300 MG capsule Take 1 capsule (300 mg total) by mouth 3 (three) times daily. 03/09/22  Yes Eulis Canner E, NP  glycopyrrolate (ROBINUL) 1 MG tablet Take 1 mg by mouth 3 (three) times daily. 07/12/22  Yes [provider]  phentermine 15 MG capsule Take 15 mg by mouth 2 (two) times daily. 08/01/22  Yes [provider]  prazosin (MINIPRESS) 1 MG capsule Take 1 capsule (1 mg total) by mouth at bedtime. 03/09/22   Yes Eulis Canner E, NP  QUEtiapine (SEROQUEL) 50 MG tablet Take 1 tablet (50 mg total) by mouth daily. 03/09/22  Yes Eulis Canner E, NP  sertraline (ZOLOFT) 50 MG tablet Take 1 tablet (50 mg total) by mouth daily. 03/09/22  Yes Eulis Canner E, NP  topiramate (TOPAMAX) 25 MG tablet Take 25 mg by mouth daily. 07/30/22  Yes [provider]  Vitamin D, Ergocalciferol, (DRISDOL) 1.25 MG (50000 UNIT) CAPS capsule Take 1 capsule (50,000 Units total) by mouth every 7 (seven) days. 07/13/22  Yes Nida, Marella Chimes, MD  fexofenadine-pseudoephedrine (ALLEGRA-D) 60-120 MG 12 hr tablet Take 1 tablet by mouth every 12 (twelve) hours. 07/08/22 09/06/22  Hans Eden, NP  fluticasone-salmeterol (ADVAIR HFA) [id number] MCG/ACT inhaler Inhale 2 puffs into the lungs 2 (two) times daily. 05/23/22   Juanito Doom, MD  ipratropium (ATROVENT) 0.03 % nasal spray Place 2 sprays into both nostrils every 12 (twelve) hours. Patient not taking: Reported on 08/04/2022 07/08/22   Hans Eden, NP  predniSONE (DELTASONE) 20 MG tablet Take 2 tablets (40 mg total) by mouth daily. Patient not taking: Reported on 08/04/2022 07/08/22   Hans Eden, NP  Spacer/Aero-Holding Chambers (AEROCHAMBER MV) inhaler Use as instructed Patient not taking: Reported on 05/23/2022 04/12/22   Juanito Doom, MD  diphenhydrAMINE (BENADRYL) 25 MG tablet Take 12.5 mg by mouth every 6 (six) hours as needed for allergies. Patient not taking: Reported on 12/30/2019  06/21/20  [provider]  Allergies    Metoclopramide    Review of Systems   Review of Systems  Constitutional:  Negative for chills and fever.  Respiratory:  Negative for shortness of breath.   Cardiovascular:  Negative for chest pain.  Gastrointestinal:  Negative for abdominal pain.  Neurological:  Negative for headaches.    Physical Exam Updated Vital Signs BP 125/81 (BP Location: Right Arm)   Pulse 99   Temp 98.2 F (36.8 C) (Oral)    Resp 18   LMP 07/02/2022 (Approximate)   SpO2 100%  Physical Exam Vitals and nursing note reviewed. Exam conducted with a chaperone present.  Constitutional:      General: She is not in acute distress.    Appearance: She is not ill-appearing.  HENT:     Head: Normocephalic and atraumatic.     Nose: No congestion.  Eyes:     Conjunctiva/sclera: Conjunctivae normal.  Cardiovascular:     Rate and Rhythm: Normal rate and regular rhythm.  Pulmonary:     Effort: Pulmonary effort is normal.  Genitourinary:    Comments: Chaperone present breast exam was performed, there is no overlying skin changes, no discharge present, there is no nipple inversion, no palpable mass present during my evaluation.  Skin:    General: Skin is warm and dry.  Neurological:     Mental Status: She is alert.  Psychiatric:        Mood and Affect: Mood normal.     ED Results / Procedures / Treatments   Labs (all labs ordered are listed, but only abnormal results are displayed) Labs Reviewed  URINALYSIS, ROUTINE W REFLEX MICROSCOPIC - Abnormal; Notable for the following components:      Result Value   APPearance HAZY (*)    All other components within normal limits  PREGNANCY, URINE    EKG None  Radiology No results found.  Procedures Procedures    Medications Ordered in ED Medications - No data to display  ED Course/ Medical Decision Making/ A&P                             Medical Decision Making Amount and/or Complexity of Data Reviewed Labs: ordered.   This patient presents to the ED for concern of breast pain, this involves an extensive number of treatment options, and is a complaint that carries with it a high risk of complications and morbidity.  The differential diagnosis includes malignancy, cellulitis, abscess, pregnancy    Additional history obtained:  Additional history obtained from N/A External records from outside source obtained and reviewed including PCP notes   Co  morbidities that complicate the patient evaluation  N/A  Social Determinants of Health:  N/A    Lab Tests:  I Ordered, and personally interpreted labs.  The pertinent results include: UA is unremarkable, urine pregnancy is negative   Imaging Studies ordered:  I ordered imaging studies including N/A I independently visualized and interpreted imaging which showed N/A I agree with the radiologist interpretation   Cardiac Monitoring:  The patient was maintained on a cardiac monitor.  I personally viewed and interpreted the cardiac monitored which showed an underlying rhythm of: N/A   Medicines ordered and prescription drug management:  I ordered medication including N/A  I have reviewed the patients home medicines and have made adjustments as needed  Critical Interventions:  N/A   Reevaluation:  Presents with pain in her breast bilaterally, triage obtained UA as well  as urine pregnancy both which were unremarkable, she had a benign physical exam, agreement discharge at this time.  Consultations Obtained:  N/A    Test Considered:  N/A    Rule out Suspicion for malignancy is low at this time as there is no nipple inversion, there is no overlying skin changes, there is no palpable masses present.  I doubt abscess or cellulitis as again there is no evidence of infection during my examination.  Doubt pregnancy at this time as her pregnancy is negative.    Dispostion and problem list  After consideration of the diagnostic results and the patients response to treatment, I feel that the patent would benefit from discharge.  Breast pain-suspect fibrocystic breast disease, will recommend NSAIDs, follow-up with OB/GYN for further evaluation.            Final Clinical Impression(s) / ED Diagnoses Final diagnoses:  Breast pain    Rx / DC Orders ED Discharge Orders     None         Marcello Fennel, PA-C 08/04/22 [id number]    Orpah Greek, MD 08/05/22 [phone]

## 2022-08-04 NOTE — ED Notes (Signed)
Patient verbalizes understanding of discharge instructions. Opportunity for questioning and answers were provided. Armband removed by staff, pt discharged from ED. Ambulated out to lobby  

## 2022-08-04 NOTE — ED Triage Notes (Signed)
Patient coming to ED for evaluation of bilateral breast pain, nausea, and fatigue x 1 week.  Reports LMP was "2 weeks ago."

## 2022-08-07 ENCOUNTER — Telehealth: Payer: Self-pay | Admitting: *Deleted

## 2022-08-07 DIAGNOSIS — R0600 Dyspnea, unspecified: Secondary | ICD-10-CM | POA: Diagnosis not present

## 2022-08-07 NOTE — Transitions of Care (Post Inpatient/ED Visit) (Signed)
   08/07/2022  Name: Martha Hall MRN: [id number] DOB: [birth year]  Today's TOC FU Call Status: Today's TOC FU Call Status:: Unsuccessul Call (1st Attempt) Unsuccessful Call (1st Attempt) Date: 08/07/22  Attempted to reach the patient regarding the most recent Inpatient/ED visit.  Follow Up Plan: Additional outreach attempts will be made to reach the patient to complete the Transitions of Care (Post Inpatient/ED visit) call.   Lurena Joiner RN, BSN [facility] RN Care Coordinator [phone]

## 2022-08-08 ENCOUNTER — Encounter: Payer: Self-pay | Admitting: Emergency Medicine

## 2022-08-08 ENCOUNTER — Telehealth: Payer: Self-pay | Admitting: *Deleted

## 2022-08-08 ENCOUNTER — Ambulatory Visit
Admission: EM | Admit: 2022-08-08 | Discharge: 2022-08-08 | Disposition: A | Discharge status: Home / Self Care | Payer: Medicaid Other | Attending: Physician Assistant | Admitting: Physician Assistant

## 2022-08-08 ENCOUNTER — Encounter: Payer: Self-pay | Admitting: Family

## 2022-08-08 DIAGNOSIS — R5383 Other fatigue: Secondary | ICD-10-CM | POA: Diagnosis not present

## 2022-08-08 DIAGNOSIS — N644 Mastodynia: Secondary | ICD-10-CM

## 2022-08-08 DIAGNOSIS — R112 Nausea with vomiting, unspecified: Secondary | ICD-10-CM | POA: Diagnosis not present

## 2022-08-08 LAB — POCT URINE PREGNANCY: Preg Test, Ur: NEGATIVE

## 2022-08-08 LAB — POCT URINALYSIS DIP (MANUAL ENTRY)
Bilirubin, UA: NEGATIVE
Blood, UA: NEGATIVE
Glucose, UA: NEGATIVE mg/dL
Leukocytes, UA: NEGATIVE
Nitrite, UA: NEGATIVE
Protein Ur, POC: NEGATIVE mg/dL
Spec Grav, UA: >=1.03 — AB (ref 1.010–1.025)
Urobilinogen, UA: 0.2 E.U./dL
pH, UA: 5.5 (ref 5.0–8.0)

## 2022-08-08 MED ORDER — NAPROXEN SODIUM 550 MG PO TABS: 550.0000 mg | ORAL_TABLET | Freq: Two times a day (BID) | ORAL | 0 refills | Status: DC

## 2022-08-08 MED ORDER — ONDANSETRON HCL 4 MG PO TABS: 4.0000 mg | ORAL_TABLET | Freq: Three times a day (TID) | ORAL | 0 refills | Status: DC | PRN

## 2022-08-08 NOTE — Discharge Instructions (Signed)
Advised take Zofran 4 mg every 8 hours on a regular basis to help decrease nausea and vomiting. Advised take Anaprox DS every 12 hours to help relieve breast pain and discomfort.  Lab results will be completed in 48 hours.  If you do not get a call from this office that indicates the test are negative.  Log onto MyChart to be the test results with the post in 48 hours.  Advised to follow-up PCP return to urgent care as needed.

## 2022-08-08 NOTE — ED Triage Notes (Addendum)
Pt is present today with bilateral breast pain, frequent urination, nausea,  and fatigue. Pt also states that she noticed milk leaking from her breast Pt sx started x2 weeks ago. Pt states that she does take birth control and she thinks her last menstrual was 3/16.

## 2022-08-08 NOTE — ED Provider Notes (Signed)
[name]    CSN: [id number] Arrival date & time: 08/08/22  [id number]      History   Chief Complaint Chief Complaint  Patient presents with   Breast Pain   Fatigue   Urinary Frequency   Nausea    [name] is a 27 y.o. female.   61-year-old female presents with nausea, vomiting, fatigue, discharge from the breast that is milky.  Patient indicates that since she had her period on July 23, 2022 she started having nausea, stomach cramping, vomiting.  She indicates she is having extreme fatigue and lethargy, weakness.  Patient also indicates that she is having severe breast pain from both breast that is not relieved with ibuprofen.  She also indicates she is having milk which is draining from both breasts intermittently.  Patient indicates her last period was March 17, and normal.  Patient indicates that she is concerned due to her amount of fatigue and the amount of breast pain that she is having.  She is without fever, chills, diarrhea.  Patient indicates she does have an appointment to be seen by women's health within the next week.  Patient indicates that she has been seen in the emergency room last month for similar type symptoms, she had a negative pregnancy test at that time.  She is tolerating fluids well.  Patient indicates her last pregnancy was 3 years ago.   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 Per consultation with Dr. Rosana Hoes, no further work-up necessary. Per MFM: " The patient was reassured that the anti-Lewis antibodies are usually of the IgM subtype and therefore we will not cross the placenta and cause fetal anemia.  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   Moderate episode of recurrent major depressive disorder 12/15/2021   PTSD (post-traumatic stress disorder) 12/15/2021   GAD (generalized anxiety disorder) 12/15/2021   Prediabetes 06/07/2021   Hospital discharge follow-up 02/04/2021   Choledocholithiasis    Cholelithiasis 01/25/2021   History of hydrocephalus 06/11/2019   History of blood clot in brain 05/28/2019   Alpha thalassemia silent carrier 05/12/2019   Morbid obesity 04/30/2019    Past Surgical History:  Procedure Laterality Date   BRAIN SURGERY  [birth year]1997   stint placed when born   CHOLECYSTECTOMY N/A 01/25/2021   Procedure: LAPAROSCOPIC CHOLECYSTECTOMY WITH ATTEMPTED CHOLANGIOGRAM;  Surgeon: Coralie Keens, MD;  Location: [place];  Service: General;  Laterality: N/A;   ERCP N/A 01/29/2021   Procedure: ENDOSCOPIC RETROGRADE CHOLANGIOPANCREATOGRAPHY (ERCP);  Surgeon: Gatha Mayer, MD;  Location: [facility] ENDOSCOPY;  Service: Endoscopy;  Laterality: N/A;   IR RADIOLOGIST EVAL & MGMT  03/23/2021   REMOVAL OF STONES  01/29/2021   Procedure: REMOVAL OF STONES;  Surgeon: Gatha Mayer, MD;  Location: [place];  Service: Endoscopy;;   SPHINCTEROTOMY  01/29/2021   Procedure: Joan Mayans;  Surgeon: Gatha Mayer, MD;  Location: [facility] ENDOSCOPY;  Service: Endoscopy;;    OB  History     Gravida  1   Para  1   Term  1   Preterm      AB  0   Living  1      SAB  0   IAB      Ectopic      Multiple  0   Live Births  1            Home Medications    Prior to Admission medications   Medication Sig Start Date End Date Taking? Authorizing Provider  naproxen sodium (ANAPROX DS) 550 MG tablet Take 1 tablet (550 mg total) by mouth 2 (two) times daily with a meal. 08/08/22  Yes  Nyoka Lint, PA-C  ondansetron (ZOFRAN) 4 MG tablet Take 1 tablet (4 mg total) by mouth every 8 (eight) hours as needed for nausea or vomiting. 08/08/22  Yes Nyoka Lint, PA-C  albuterol (PROVENTIL) (2.5 MG/3ML) 0.083% nebulizer solution Take 3 mLs (2.5 mg total) by nebulization every 6 (six) hours as needed for wheezing or shortness of breath. 04/04/22   Camillia Herter, NP  albuterol (VENTOLIN HFA) 108 (90 Base) MCG/ACT inhaler Inhale 2 puffs into the lungs every 6 (six) hours as needed for wheezing or shortness of breath. 04/04/22   Camillia Herter, NP  fexofenadine-pseudoephedrine (ALLEGRA-D) 60-120 MG 12 hr tablet Take 1 tablet by mouth every 12 (twelve) hours. 07/08/22 09/06/22  Hans Eden, NP  fluticasone-salmeterol (ADVAIR HFA) [id number] MCG/ACT inhaler Inhale 2 puffs into the lungs 2 (two) times daily. 05/23/22   Juanito Doom, MD  fluticasone-salmeterol (ADVAIR HFA) [id number] MCG/ACT inhaler Inhale 2 puffs into the lungs 2 (two) times daily. 05/23/22   Juanito Doom, MD  gabapentin (NEURONTIN) 300 MG capsule Take 1 capsule (300 mg total) by mouth 3 (three) times daily. 03/09/22   Salley Slaughter, NP  glycopyrrolate (ROBINUL) 1 MG tablet Take 1 mg by mouth 3 (three) times daily. 07/12/22   [provider]  ipratropium (ATROVENT) 0.03 % nasal spray Place 2 sprays into both nostrils every 12 (twelve) hours. Patient not taking: Reported on 08/04/2022 07/08/22   Hans Eden, NP  phentermine 15 MG capsule Take 15 mg by mouth 2 (two) times daily. 08/01/22   [provider]  prazosin (MINIPRESS) 1 MG capsule Take 1 capsule (1 mg total) by mouth at bedtime. 03/09/22   Salley Slaughter, NP  predniSONE (DELTASONE) 20 MG tablet Take 2 tablets (40 mg total) by mouth daily. Patient not taking: Reported on 08/04/2022 07/08/22   Hans Eden, NP  QUEtiapine (SEROQUEL) 50 MG tablet Take 1 tablet (50 mg total) by mouth daily. 03/09/22   Salley Slaughter, NP  sertraline  (ZOLOFT) 50 MG tablet Take 1 tablet (50 mg total) by mouth daily. 03/09/22   Salley Slaughter, NP  Spacer/Aero-Holding Chambers (AEROCHAMBER MV) inhaler Use as instructed Patient not taking: Reported on 05/23/2022 04/12/22   Juanito Doom, MD  topiramate (TOPAMAX) 25 MG tablet Take 25 mg by mouth daily. 07/30/22   [provider]  Vitamin D, Ergocalciferol, (DRISDOL) 1.25 MG (50000 UNIT) CAPS capsule Take 1 capsule (50,000 Units total) by mouth every 7 (seven) days. 07/13/22   Cassandria Anger, MD  diphenhydrAMINE (BENADRYL) 25 MG tablet Take 12.5 mg by mouth every 6 (six) hours as needed for allergies. Patient not taking: Reported on 12/30/2019  06/21/20  [provider]    Family History Family History  Problem Relation  Age of Onset   Healthy Mother    Healthy Father     Social History Social History   Tobacco Use   Smoking status: Never    Passive exposure: Never   Smokeless tobacco: Never  Vaping Use   Vaping Use: Never used  Substance Use Topics   Alcohol use: Never   Drug use: Never     Allergies   Metoclopramide   Review of Systems Review of Systems  Gastrointestinal:  Positive for nausea and vomiting.  Genitourinary:  Positive for frequency.     Physical Exam Triage Vital Signs ED Triage Vitals  Enc Vitals Group     BP 08/08/22 1038 110/83     Pulse Rate 08/08/22 1038 99     Resp 08/08/22 1038 17     Temp 08/08/22 1039 97.9 F (36.6 C)     Temp src --      SpO2 08/08/22 1038 100 %     Weight --      Height --      Head Circumference --      Peak Flow --      Pain Score 08/08/22 1037 9     Pain Loc --      Pain Edu? --      Excl. in [place]? --    No data found.  Updated Vital Signs BP 110/83   Pulse 99   Temp 97.9 F (36.6 C)   Resp 17   LMP 07/22/2022 (Approximate)   SpO2 100%   Visual Acuity Right Eye Distance:   Left Eye Distance:   Bilateral Distance:    Right Eye Near:   Left Eye Near:    Bilateral Near:      Physical Exam Constitutional:      Appearance: Normal appearance.  HENT:     Right Ear: Tympanic membrane and ear canal normal.     Left Ear: Tympanic membrane and ear canal normal.     Mouth/Throat:     Mouth: Mucous membranes are moist.     Pharynx: Oropharynx is clear.  Cardiovascular:     Rate and Rhythm: Normal rate and regular rhythm.     Heart sounds: Normal heart sounds.  Pulmonary:     Effort: Pulmonary effort is normal.     Breath sounds: Normal breath sounds and air entry. No wheezing, rhonchi or rales.  Chest:     Comments: Breast: Tenderness on palpation of breast bilaterally but no unusual lumps, bumps or masses. Compressing on the nipples heals a drop of white milky discharge on the right breast but no discharge on the left. Abdominal:     General: Abdomen is flat. Bowel sounds are normal.     Palpations: Abdomen is soft.     Tenderness: There is no abdominal tenderness.  Lymphadenopathy:     Cervical: No cervical adenopathy.  Neurological:     Mental Status: She is alert.      UC Treatments / Results  Labs (all labs ordered are listed, but only abnormal results are displayed) Labs Reviewed  POCT URINALYSIS DIP (MANUAL ENTRY) - Abnormal; Notable for the following components:      Result Value   Ketones, POC UA small (15) (*)    Spec Grav, UA >=1.030 (*)    All other components within normal limits  PROLACTIN  POCT URINE PREGNANCY    EKG   Radiology No results found.  Procedures Procedures (including critical care time)  Medications Ordered in UC Medications -  No data to display  Initial Impression / Assessment and Plan / UC Course  I have reviewed the triage vital signs and the nursing notes.  Pertinent labs & imaging results that were available during my care of the patient were reviewed by me and considered in my medical decision making (see chart for details).    Plan: The diagnosis be treated with the following: 1.  Nausea and  vomiting: A.  Zofran 4 mg every 8 hours as needed for nausea and vomiting. 2.  Fatigue: A.  Advised to continue to eat nutritious meals and to take a multivitamin daily. 3.  Mastodynia: A.  Advised to make sure that she keep your appointment to see women's health in 1 week. B.  Prolactin lab test results are pending. C.  Advised take Anaprox DS every 12 hours to help relieve pain and discomfort. 4.  Advised follow-up PCP return to urgent care as needed. Final Clinical Impressions(s) / UC Diagnoses   Final diagnoses:  Nausea and vomiting, unspecified vomiting type  Other fatigue  Mastodynia     Discharge Instructions      Advised take Zofran 4 mg every 8 hours on a regular basis to help decrease nausea and vomiting. Advised take Anaprox DS every 12 hours to help relieve breast pain and discomfort.  Lab results will be completed in 48 hours.  If you do not get a call from this office that indicates the test are negative.  Log onto MyChart to be the test results with the post in 48 hours.  Advised to follow-up PCP return to urgent care as needed.     ED Prescriptions     Medication Sig Dispense Auth. Provider   ondansetron (ZOFRAN) 4 MG tablet Take 1 tablet (4 mg total) by mouth every 8 (eight) hours as needed for nausea or vomiting. 20 tablet Nyoka Lint, PA-C   naproxen sodium (ANAPROX DS) 550 MG tablet Take 1 tablet (550 mg total) by mouth 2 (two) times daily with a meal. 20 tablet Nyoka Lint, PA-C      PDMP not reviewed this encounter.   Nyoka Lint, PA-C 08/08/22 1127

## 2022-08-08 NOTE — Transitions of Care (Post Inpatient/ED Visit) (Signed)
   08/08/2022  Name: Stanislawa Hojnacki MRN: [id number] DOB: 17-Mar-1996  Today's TOC FU Call Status: Today's TOC FU Call Status:: Unsuccessful Call (2nd Attempt) Unsuccessful Call (2nd Attempt) Date: 08/08/22  Attempted to reach the patient regarding the most recent Inpatient/ED visit.  Follow Up Plan: Additional outreach attempts will be made to reach the patient to complete the Transitions of Care (Post Inpatient/ED visit) call.   Lurena Joiner RN, BSN [facility] RN Care Coordinator [phone]

## 2022-08-09 ENCOUNTER — Telehealth: Payer: Self-pay | Admitting: *Deleted

## 2022-08-09 LAB — PROLACTIN: Prolactin: 18.2 ng/mL (ref 4.8–33.4)

## 2022-08-09 NOTE — Transitions of Care (Post Inpatient/ED Visit) (Signed)
   08/09/2022  Name: Martha Hall MRN: [id number] DOB: 01/02/1996  Today's TOC FU Call Status: Today's TOC FU Call Status:: Successful TOC FU Call Competed TOC FU Call Complete Date: 08/09/22  Transition Care Management Follow-up Telephone Call Date of Discharge: 08/04/22 Discharge Facility: [facility]) Type of Discharge: Emergency Department Reason for ED Visit: Other: (Breast pain) How have you been since you were released from the hospital?: Same Any questions or concerns?: No  Items Reviewed: Did you receive and understand the discharge instructions provided?: Yes Medications obtained and verified?: Yes (Medications Reviewed) Any new allergies since your discharge?: No Dietary orders reviewed?: NA Do you have support at home?: Yes People in Home: parent(s) Name of Support/Comfort Primary Source: Mother/Martha Hall  Home Care and Equipment/Supplies: Were Home Health Services Ordered?: NA Any new equipment or medical supplies ordered?: NA  Functional Questionnaire: Do you need assistance with bathing/showering or dressing?: No Do you need assistance with meal preparation?: No Do you need assistance with eating?: No Do you have difficulty maintaining continence: No Do you need assistance with getting out of bed/getting out of a chair/moving?: No Do you have difficulty managing or taking your medications?: No  Follow up appointments reviewed: PCP Follow-up appointment confirmed?: Yes Date of PCP follow-up appointment?: 08/10/23 Follow-up Provider: [facility] Follow-up appointment confirmed?: Yes Date of Specialist follow-up appointment?: 08/17/22 Follow-Up Specialty Provider:: GYN Do you need transportation to your follow-up appointment?: No Do you understand care options if your condition(s) worsen?: Yes-patient verbalized understanding  SDOH Interventions Today    Flowsheet Row Most Recent Value  SDOH Interventions   Transportation  Interventions Intervention Not Indicated      The patient was given information about care management services as a benefit of their Medicaid health plan today.    Patient                                              agreed to services and verbal consent obtained.   Initial telephone outreach scheduled on 08/14/22.  Lurena Joiner RN, BSN [facility] RN Care Coordinator [phone]
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
## 2022-08-10 ENCOUNTER — Ambulatory Visit (INDEPENDENT_AMBULATORY_CARE_PROVIDER_SITE_OTHER): Payer: Medicaid Other | Admitting: Family

## 2022-08-10 VITALS — BP 108/74 | HR 71 | Temp 98.4°F | Resp 16 | Ht 61.0 in | Wt 235.0 lb

## 2022-08-10 DIAGNOSIS — R5383 Other fatigue: Secondary | ICD-10-CM | POA: Diagnosis not present

## 2022-08-10 DIAGNOSIS — R61 Generalized hyperhidrosis: Secondary | ICD-10-CM | POA: Diagnosis not present

## 2022-08-10 DIAGNOSIS — R112 Nausea with vomiting, unspecified: Secondary | ICD-10-CM

## 2022-08-10 DIAGNOSIS — N644 Mastodynia: Secondary | ICD-10-CM | POA: Diagnosis not present

## 2022-08-10 MED ORDER — NAPROXEN SODIUM 550 MG PO TABS: 550.0000 mg | ORAL_TABLET | Freq: Two times a day (BID) | ORAL | 0 refills | Status: DC

## 2022-08-10 NOTE — Patient Instructions (Signed)
Breast Tenderness Breast tenderness is a common problem for women of all ages, but may also occur in men. Breast tenderness has many possible causes, including hormone changes, infections, taking certain medicines, and caffeine intake. In women, the pain usually comes and goes with the menstrual cycle, but it can also be constant. Breast tenderness may range from mild discomfort to severe pain. You may have tests, such as a mammogram or an ultrasound, to check for any unusual findings. Having breast tenderness usually does not mean that you have breast cancer. Follow these instructions at home: Managing pain and discomfort  If directed, put ice on the painful area. To do this: Put ice in a plastic bag. Place a towel between your skin and the bag. Leave the ice on for 20 minutes, 2-3 times a day. If your skin turns bright red, remove the ice right away to prevent skin damage. The risk of skin damage is higher if you cannot feel pain, heat, or cold. Wear a supportive bra or chest support: During exercise. While sleeping, if your breasts are very tender. Medicines Take over-the-counter and prescription medicines only as told by your health care provider. If the cause of your pain is an infection, you may be prescribed an antibiotic medicine. If you were prescribed antibiotics, take them as told by your health care provider. Do not stop using the antibiotic even if you start to feel better. Eating and drinking Decrease the amount of caffeine in your diet. Instead, drink more water and choose caffeine-free drinks. Your health care provider may recommend that you lessen the amount of fat in your diet. You can do this by: Limiting fried foods. Cooking foods using methods such as baking, boiling, grilling, and broiling. General instructions  Keep a log of the days and times when your breasts are most tender. Ask your health care provider how to do breast exams at home. This will help you notice if  you have an unusual growth or lump. Keep all follow-up visits. Contact a health care provider if: Any part of your breast is hard, red, and hot to the touch. This may be a sign of infection. You are a woman and have a new or painful lump in your breast that remains after your menstrual period ends. You are not breastfeeding and you have fluid, especially blood or pus, coming out of your nipples. You have a fever. Your pain does not improve or it gets worse. Your pain is interfering with your daily activities. Summary Breast tenderness may range from mild discomfort to severe pain. Breast tenderness has many possible causes, including hormone changes, infections, taking certain medicines, and caffeine intake. It can be treated with ice, wearing a supportive bra or chest support, and medicines. Make changes to your diet as told by your health care provider. This information is not intended to replace advice given to you by your health care provider. Make sure you discuss any questions you have with your health care provider. Document Revised: 07/06/2021 Document Reviewed: 07/06/2021 Elsevier Patient Education  2023 Elsevier Inc.  

## 2022-08-10 NOTE — Progress Notes (Signed)
Pt is here for Urgent Care F/U  Pt complaining of bi-lateral breast pain for X2 weeks   Nausea, fatigue, headache and states she able to express milk( she has a 25 year old child but states she never breastfed)

## 2022-08-14 ENCOUNTER — Other Ambulatory Visit: Payer: Medicaid Other | Admitting: *Deleted

## 2022-08-14 NOTE — Patient Instructions (Signed)
Visit Information  [name]  - as a part of your Medicaid benefit, you are eligible for care management and care coordination services at no cost or copay. I was unable to reach you by phone today but would be happy to help you with your health related needs. Please feel free to call me @ [phone].   A member of the Managed Medicaid care management team will reach out to you again over the next 7 days.   Estanislado Emms RN, BSN [place]  Managed [facility] RN Care Coordinator [phone]

## 2022-08-14 NOTE — Patient Outreach (Signed)
  Medicaid Managed Care   Unsuccessful Attempt Note   08/14/2022 Name: Martha Hall MRN: [id number] DOB: 26-Jul-1995  Referred by: Rema Fendt, NP Reason for referral : High Risk Managed Medicaid (Unsuccessful RNCM initial telephone outreach)   An unsuccessful telephone outreach was attempted today. The patient was referred to the case management team for assistance with care management and care coordination.    Follow Up Plan: A HIPAA compliant phone message was left for the patient providing contact information and requesting a return call. and The Managed Medicaid care management team will reach out to the patient again over the next 7 days.    Estanislado Emms RN, BSN [place]  Managed [facility] RN Care Coordinator [phone]

## 2022-08-17 ENCOUNTER — Encounter: Payer: Self-pay | Admitting: Family Medicine

## 2022-08-17 ENCOUNTER — Telehealth: Payer: Self-pay | Admitting: Family Medicine

## 2022-08-17 ENCOUNTER — Encounter: Payer: Medicaid Other | Admitting: Family Medicine

## 2022-08-17 NOTE — Telephone Encounter (Signed)
Spoke with patient this morning. She sent a message stating she has been trying to get to someone in the office to cancel her appointment. She stated she was in the middle of getting evicted, and would not be able to make an appointment at this time. I informed patient she could reschedule through her MyChart, or send a message rescheduling since she had a hard time reaching someone. She stated she would.

## 2022-08-18 ENCOUNTER — Telehealth: Payer: Self-pay

## 2022-08-18 NOTE — Telephone Encounter (Signed)
..   Medicaid Managed Care   Unsuccessful Outreach Note  08/18/2022 Name: Martha Hall MRN: [id number] DOB: 1996/01/15  Referred by: Rema Fendt, NP Reason for referral : Appointment (I called to reschedule her missed phone appointment with the MM RNCM.)   A second unsuccessful telephone outreach was attempted today. The patient was referred to the case management team for assistance with care management and care coordination.   Follow Up Plan: A HIPAA compliant phone message was left for the patient providing contact information and requesting a return call.  The care management team will reach out to the patient again over the next 7 days.   Weston Settle Care Guide  [facility] Managed  Care Guide [facility]  [phone]

## 2022-08-22 ENCOUNTER — Other Ambulatory Visit: Payer: Medicaid Other | Admitting: *Deleted

## 2022-08-22 ENCOUNTER — Telehealth: Payer: Self-pay

## 2022-08-22 NOTE — Telephone Encounter (Signed)
..   Medicaid Managed Care   Unsuccessful Outreach Note  08/22/2022 Name: Martha Hall MRN: [id number] DOB: 05/02/1996  Referred by: Rema Fendt, NP Reason for referral : Appointment   Third unsuccessful telephone outreach was attempted today. The patient was referred to the case management team for assistance with care management and care coordination. The patient's primary care provider has been notified of our unsuccessful attempts to make or maintain contact with the patient. The care management team is pleased to engage with this patient at any time in the future should he/she be interested in assistance from the care management team.   Follow Up Plan: We have been unable to make contact with the patient for follow up. The care management team is available to follow up with the patient after provider conversation with the patient regarding recommendation for care management engagement and subsequent re-referral to the care management team.   Weston Settle Care Guide  [facility] Managed  Care Guide [facility]  [phone]

## 2022-08-22 NOTE — Patient Outreach (Signed)
   Embedded Care Coordination  Case Closure Note  08/22/2022 Name: Martha Hall MRN: [id number] DOB: 1996-02-13  Martha Hall is a 27 y.o. year old female who is a primary care patient of Rema Fendt, NP. The Embedded Care Coordination team was consulted for assistance with chronic disease management and care coordination needs related to  Health Management  We have been unable to make contact with the patient for follow up. The care management team is available to follow up with the patient after provider conversation with the patient regarding recommendation for care management engagement and subsequent re-referral to the care management team [phone]).   If patient returns call to provider office and is in need of assistance from the embedded care coordination team, please advise that the patient call the [facility] Care Guide at [phone] for assistance.   Estanislado Emms RN, BSN [place]  Managed [facility] RN Care Coordinator [phone]

## 2022-08-27 ENCOUNTER — Telehealth: Payer: Medicaid Other

## 2022-08-30 ENCOUNTER — Encounter: Payer: Self-pay | Admitting: Family

## 2022-09-02 ENCOUNTER — Telehealth: Payer: Medicaid Other

## 2022-09-06 DIAGNOSIS — R0600 Dyspnea, unspecified: Secondary | ICD-10-CM | POA: Diagnosis not present

## 2022-09-12 ENCOUNTER — Other Ambulatory Visit: Payer: Self-pay | Admitting: Family

## 2022-09-12 DIAGNOSIS — N644 Mastodynia: Secondary | ICD-10-CM

## 2022-09-12 NOTE — Telephone Encounter (Signed)
Complete

## 2022-09-14 ENCOUNTER — Encounter: Payer: Self-pay | Admitting: Family

## 2022-09-15 ENCOUNTER — Telehealth: Payer: Self-pay | Admitting: Family

## 2022-09-17 DIAGNOSIS — F32A Depression, unspecified: Secondary | ICD-10-CM | POA: Diagnosis not present

## 2022-09-17 DIAGNOSIS — R5381 Other malaise: Secondary | ICD-10-CM | POA: Diagnosis not present

## 2022-09-17 DIAGNOSIS — R5383 Other fatigue: Secondary | ICD-10-CM | POA: Diagnosis not present

## 2022-09-17 DIAGNOSIS — E559 Vitamin D deficiency, unspecified: Secondary | ICD-10-CM | POA: Diagnosis not present

## 2022-09-17 DIAGNOSIS — F419 Anxiety disorder, unspecified: Secondary | ICD-10-CM | POA: Diagnosis not present

## 2022-09-17 DIAGNOSIS — Z3200 Encounter for pregnancy test, result unknown: Secondary | ICD-10-CM | POA: Diagnosis not present

## 2022-09-17 DIAGNOSIS — E7800 Pure hypercholesterolemia, unspecified: Secondary | ICD-10-CM | POA: Diagnosis not present

## 2022-09-17 DIAGNOSIS — R635 Abnormal weight gain: Secondary | ICD-10-CM | POA: Diagnosis not present

## 2022-09-17 DIAGNOSIS — R0602 Shortness of breath: Secondary | ICD-10-CM | POA: Diagnosis not present

## 2022-09-17 DIAGNOSIS — Z6841 Body Mass Index (BMI) 40.0 and over, adult: Secondary | ICD-10-CM | POA: Diagnosis not present

## 2022-09-17 DIAGNOSIS — Z0130 Encounter for examination of blood pressure without abnormal findings: Secondary | ICD-10-CM | POA: Diagnosis not present
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
## 2022-09-18 NOTE — Telephone Encounter (Signed)
Pt called back and got her scheduled for appt

## 2022-09-19 ENCOUNTER — Telehealth: Payer: Self-pay | Admitting: Family

## 2022-09-19 ENCOUNTER — Other Ambulatory Visit (HOSPITAL_COMMUNITY)
Admission: RE | Admit: 2022-09-19 | Discharge: 2022-09-19 | Disposition: A | Discharge status: Home / Self Care | Payer: Medicaid Other | Source: Ambulatory Visit | Attending: Family | Admitting: Family

## 2022-09-19 ENCOUNTER — Ambulatory Visit (INDEPENDENT_AMBULATORY_CARE_PROVIDER_SITE_OTHER): Payer: Medicaid Other | Admitting: Family

## 2022-09-19 ENCOUNTER — Encounter: Payer: Self-pay | Admitting: Family

## 2022-09-19 VITALS — BP 110/78 | HR 68 | Temp 97.5°F | Resp 12 | Ht 60.0 in | Wt 229.0 lb

## 2022-09-19 DIAGNOSIS — N644 Mastodynia: Secondary | ICD-10-CM | POA: Diagnosis not present

## 2022-09-19 DIAGNOSIS — R61 Generalized hyperhidrosis: Secondary | ICD-10-CM

## 2022-09-19 DIAGNOSIS — Z3200 Encounter for pregnancy test, result unknown: Secondary | ICD-10-CM

## 2022-09-19 DIAGNOSIS — N3001 Acute cystitis with hematuria: Secondary | ICD-10-CM

## 2022-09-19 DIAGNOSIS — R350 Frequency of micturition: Secondary | ICD-10-CM

## 2022-09-19 LAB — POCT URINALYSIS DIP (CLINITEK)
Glucose, UA: NEGATIVE mg/dL
Nitrite, UA: POSITIVE — AB
POC PROTEIN,UA: >=300 — AB
Spec Grav, UA: 1.025 (ref 1.010–1.025)
Urobilinogen, UA: 1 E.U./dL
pH, UA: 5 (ref 5.0–8.0)

## 2022-09-19 MED ORDER — NITROFURANTOIN MONOHYD MACRO 100 MG PO CAPS: 100.0000 mg | ORAL_CAPSULE | Freq: Two times a day (BID) | ORAL | 0 refills | Status: AC

## 2022-09-19 NOTE — Telephone Encounter (Signed)
Discussed with patient during today's office visit.

## 2022-09-19 NOTE — Telephone Encounter (Signed)
Copied from CRM [id number]. Topic: Referral - Status >> Sep 19, 2022 11:48 AM Epimenio Foot F wrote: Reason for CRM: Pt is calling in because she says Amy sent a referral in for her for endocrinology and sent it to the wrong place. Pt says the referral was sent to a pediatric doctor instead of an endocrinologist. Pt is requesting the referral be sent to the right location.

## 2022-09-19 NOTE — Progress Notes (Signed)
Breast tenderness and nipple discharge. Possibly milk Frequent urination Concern with hormones  Needs the Endo referral change Has OB/GYn apt coming up.

## 2022-09-20 LAB — CERVICOVAGINAL ANCILLARY ONLY
Bacterial Vaginitis (gardnerella): NEGATIVE
Candida Glabrata: NEGATIVE
Candida Vaginitis: NEGATIVE
Chlamydia: NEGATIVE
Comment: NEGATIVE
Comment: NEGATIVE
Comment: NEGATIVE
Comment: NEGATIVE
Comment: NEGATIVE
Comment: NORMAL
Neisseria Gonorrhea: NEGATIVE
Trichomonas: NEGATIVE

## 2022-09-21 ENCOUNTER — Other Ambulatory Visit: Payer: Medicaid Other

## 2022-09-21 NOTE — Progress Notes (Signed)
Labs drawn

## 2022-09-22 LAB — HCG, SERUM, QUALITATIVE: hCG,Beta Subunit,Qual,Serum: NEGATIVE m[IU]/mL (ref <6)

## 2022-09-26 ENCOUNTER — Encounter (HOSPITAL_COMMUNITY): Payer: Self-pay

## 2022-09-26 ENCOUNTER — Emergency Department (HOSPITAL_COMMUNITY)
Admission: EM | Admit: 2022-09-26 | Discharge: 2022-09-27 | Disposition: A | Discharge status: Home / Self Care | Payer: Medicaid Other | Attending: Emergency Medicine | Admitting: Emergency Medicine

## 2022-09-26 ENCOUNTER — Other Ambulatory Visit: Payer: Self-pay

## 2022-09-26 ENCOUNTER — Emergency Department (HOSPITAL_COMMUNITY): Payer: Medicaid Other

## 2022-09-26 DIAGNOSIS — R519 Headache, unspecified: Secondary | ICD-10-CM | POA: Diagnosis not present

## 2022-09-26 DIAGNOSIS — Z982 Presence of cerebrospinal fluid drainage device: Secondary | ICD-10-CM | POA: Diagnosis not present

## 2022-09-26 HISTORY — DX: Post-traumatic stress disorder, unspecified: F43.10

## 2022-09-26 HISTORY — DX: Generalized anxiety disorder: F41.1

## 2022-09-26 HISTORY — DX: Generalized hyperhidrosis: R61

## 2022-09-26 LAB — BASIC METABOLIC PANEL
Anion gap: 8 (ref 5–15)
BUN: 10 mg/dL (ref 6–20)
CO2: 21 mmol/L — ABNORMAL LOW (ref 22–32)
Calcium: 9.3 mg/dL (ref 8.9–10.3)
Chloride: 109 mmol/L (ref 98–111)
Creatinine, Ser: 0.88 mg/dL (ref 0.44–1.00)
GFR, Estimated: >60 mL/min (ref >60)
Glucose, Bld: 90 mg/dL (ref 70–99)
Potassium: 3.8 mmol/L (ref 3.5–5.1)
Sodium: 138 mmol/L (ref 135–145)

## 2022-09-26 LAB — CBC WITH DIFFERENTIAL/PLATELET
Abs Immature Granulocytes: 0.01 10*3/uL (ref 0.00–0.07)
Basophils Absolute: 0 10*3/uL (ref 0.0–0.1)
Basophils Relative: 1 %
Eosinophils Absolute: 0.1 10*3/uL (ref 0.0–0.5)
Eosinophils Relative: 1 %
HCT: 41.4 % (ref 36.0–46.0)
Hemoglobin: 13.1 g/dL (ref 12.0–15.0)
Immature Granulocytes: 0 %
Lymphocytes Relative: 42 %
Lymphs Abs: 2.4 10*3/uL (ref 0.7–4.0)
MCH: 27.3 pg (ref 26.0–34.0)
MCHC: 31.6 g/dL (ref 30.0–36.0)
MCV: 86.4 fL (ref 80.0–100.0)
Monocytes Absolute: 0.4 10*3/uL (ref 0.1–1.0)
Monocytes Relative: 7 %
Neutro Abs: 2.8 10*3/uL (ref 1.7–7.7)
Neutrophils Relative %: 49 %
Platelets: 391 10*3/uL (ref 150–400)
RBC: 4.79 MIL/uL (ref 3.87–5.11)
RDW: 13.3 % (ref 11.5–15.5)
WBC: 5.7 10*3/uL (ref 4.0–10.5)
nRBC: 0 % (ref 0.0–0.2)

## 2022-09-26 MED ORDER — PROCHLORPERAZINE MALEATE 10 MG PO TABS: 10.0000 mg | ORAL_TABLET | Freq: Two times a day (BID) | ORAL | 0 refills | Status: DC | PRN

## 2022-09-26 MED ORDER — DIPHENHYDRAMINE HCL 50 MG/ML IJ SOLN
12.5000 mg | Freq: Once | INTRAMUSCULAR | Status: AC
  Administered 2022-09-26: 12.5 mg via INTRAVENOUS
  Filled 2022-09-26: qty 1

## 2022-09-26 MED ORDER — DEXAMETHASONE SODIUM PHOSPHATE 4 MG/ML IJ SOLN
4.0000 mg | Freq: Once | INTRAMUSCULAR | Status: AC
  Administered 2022-09-26: 4 mg via INTRAVENOUS
  Filled 2022-09-26: qty 1

## 2022-09-26 MED ORDER — SODIUM CHLORIDE 0.9 % IV BOLUS
1000.0000 mL | Freq: Once | INTRAVENOUS | Status: AC
  Administered 2022-09-26: 1000 mL via INTRAVENOUS

## 2022-09-26 MED ORDER — PROCHLORPERAZINE EDISYLATE 10 MG/2ML IJ SOLN
10.0000 mg | Freq: Once | INTRAMUSCULAR | Status: AC
  Administered 2022-09-26: 10 mg via INTRAVENOUS
  Filled 2022-09-26: qty 2

## 2022-09-26 NOTE — ED Triage Notes (Signed)
Patient reports Lt side headaches and eye due to migraine headaches for over a week.

## 2022-09-26 NOTE — ED Provider Notes (Signed)
[place] EMERGENCY DEPARTMENT AT [facility] Provider Note   CSN: [id number] Arrival date & time: 09/26/22  2024     History  Chief Complaint  Patient presents with   Migraine    Pain includes neck     Savayah Garzon is a 27 y.o. female history of PTSD, gallstones, here presenting with headaches.  Patient states that she has migraines but over the last week and a half and got much worse.  She states that she has pain behind her left eye now.  She been taking Tylenol p.m. with minimal relief.  Patient has some nausea but no vomiting.   The history is provided by the patient.       Home Medications Prior to Admission medications   Medication Sig Start Date End Date Taking? Authorizing Provider  albuterol (PROVENTIL) (2.5 MG/3ML) 0.083% nebulizer solution Take 3 mLs (2.5 mg total) by nebulization every 6 (six) hours as needed for wheezing or shortness of breath. 04/04/22   Rema Fendt, NP  albuterol (VENTOLIN HFA) 108 (90 Base) MCG/ACT inhaler Inhale 2 puffs into the lungs every 6 (six) hours as needed for wheezing or shortness of breath. 04/04/22   Rema Fendt, NP  fluticasone-salmeterol (ADVAIR HFA) [phone] MCG/ACT inhaler Inhale 2 puffs into the lungs 2 (two) times daily. 05/23/22   Lupita Leash, MD  gabapentin (NEURONTIN) 300 MG capsule Take 1 capsule (300 mg total) by mouth 3 (three) times daily. 03/09/22   Shanna Cisco, NP  glycopyrrolate (ROBINUL) 1 MG tablet Take 1 mg by mouth 3 (three) times daily. 07/12/22   [provider]  naproxen sodium (ANAPROX DS) 550 MG tablet Take 1 tablet (550 mg total) by mouth 2 (two) times daily with a meal. 08/10/22   Zonia Kief, Amy J, NP  ondansetron (ZOFRAN) 4 MG tablet Take 1 tablet (4 mg total) by mouth every 8 (eight) hours as needed for nausea or vomiting. 08/08/22   Ellsworth Lennox, PA-C  phentermine 15 MG capsule Take 15 mg by mouth 2 (two) times daily. 08/01/22   [provider]  prazosin  (MINIPRESS) 1 MG capsule Take 1 capsule (1 mg total) by mouth at bedtime. 03/09/22   Shanna Cisco, NP  QUEtiapine (SEROQUEL) 50 MG tablet Take 1 tablet (50 mg total) by mouth daily. 03/09/22   Shanna Cisco, NP  sertraline (ZOLOFT) 50 MG tablet Take 1 tablet (50 mg total) by mouth daily. 03/09/22   Shanna Cisco, NP  Spacer/Aero-Holding Chambers (AEROCHAMBER MV) inhaler Use as instructed Patient not taking: Reported on 05/23/2022 04/12/22   Lupita Leash, MD  topiramate (TOPAMAX) 25 MG tablet Take 25 mg by mouth daily. 07/30/22   [provider]  Vitamin D, Ergocalciferol, (DRISDOL) 1.25 MG (50000 UNIT) CAPS capsule Take 1 capsule (50,000 Units total) by mouth every 7 (seven) days. 07/13/22   Roma Kayser, MD  diphenhydrAMINE (BENADRYL) 25 MG tablet Take 12.5 mg by mouth every 6 (six) hours as needed for allergies. Patient not taking: Reported on 12/30/2019  06/21/20  [provider]      Allergies    Metoclopramide    Review of Systems   Review of Systems  Neurological:  Positive for headaches.  All other systems reviewed and are negative.   Physical Exam Updated Vital Signs BP 124/84 (BP Location: Right Arm)   Pulse 94   Temp 98 F (36.7 C)   Resp 18   LMP 09/16/2022 (Approximate)   SpO2  99%  Physical Exam Vitals and nursing note reviewed.  Constitutional:      Comments: Uncomfortable  HENT:     Head: Normocephalic.     Nose: Nose normal.     Mouth/Throat:     Mouth: Mucous membranes are moist.  Eyes:     Extraocular Movements: Extraocular movements intact.     Pupils: Pupils are equal, round, and reactive to light.  Cardiovascular:     Rate and Rhythm: Normal rate and regular rhythm.     Pulses: Normal pulses.     Heart sounds: Normal heart sounds.  Pulmonary:     Effort: Pulmonary effort is normal.     Breath sounds: Normal breath sounds.  Abdominal:     General: Abdomen is flat.     Palpations: Abdomen is soft.   Musculoskeletal:        General: Normal range of motion.     Cervical back: Normal range of motion and neck supple.  Skin:    General: Skin is warm.     Capillary Refill: Capillary refill takes less than 2 seconds.  Neurological:     General: No focal deficit present.     Mental Status: She is oriented to person, place, and time.     Comments: Normal strength and sensation bilaterally.  Patient has no obvious facial droop  Psychiatric:        Mood and Affect: Mood normal.     ED Results / Procedures / Treatments   Labs (all labs ordered are listed, but only abnormal results are displayed) Labs Reviewed  CBC WITH DIFFERENTIAL/PLATELET  BASIC METABOLIC PANEL    EKG None  Radiology No results found.  Procedures Procedures    Medications Ordered in ED Medications  prochlorperazine (COMPAZINE) injection 10 mg (has no administration in time range)  diphenhydrAMINE (BENADRYL) injection 12.5 mg (has no administration in time range)  dexamethasone (DECADRON) injection 4 mg (has no administration in time range)  sodium chloride 0.9 % bolus 1,000 mL (has no administration in time range)    ED Course/ Medical Decision Making/ A&P                             Medical Decision Making Esmeralda Guster is a 26 y.o. female here presenting with headaches.  Patient has headache and left blurry vision that is going on for about a week and a half.  This is unusual for her.  Plan to get CT head to rule out mass and labs and migraine cocktail.  10:52 PM I reviewed patient's labs and independently interpreted imaging study.  Labs are unremarkable.  CT showed shunt in place and ventricles are decompressed.  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  Since her shunt is functioning, I do not think she needs emergent neurosurgery intervention.  I told her that she should  follow-up with neurosurgery outpatient.  11:07 PM Patient's better now.  Stable for discharge.  Problems Addressed: Nonintractable headache, unspecified chronicity pattern, unspecified headache type: acute illness or injury S/P VP shunt: acute illness or injury  Amount and/or Complexity of Data Reviewed Labs: ordered. Decision-making details documented in ED Course. Radiology: ordered and independent interpretation performed. Decision-making details documented in ED Course.  Risk Prescription drug management.    Final Clinical Impression(s) / ED  Diagnoses Final diagnoses:  None    Rx / DC Orders ED Discharge Orders     None         Charlynne Pander, MD 09/26/22 2308

## 2022-09-26 NOTE — Discharge Instructions (Addendum)
As we discussed, your shunt is functioning well but you need to follow-up with the neurosurgeon to help manage your shot  Continue taking Tylenol for headache and take Compazine for severe headaches  Also follow-up with your primary care doctor  Return to ER if you have worse headache, vomiting

## 2022-09-27 NOTE — Telephone Encounter (Signed)
Appt completed 09/19/22

## 2022-10-02 ENCOUNTER — Telehealth: Payer: Medicaid Other | Admitting: Physician Assistant

## 2022-10-02 ENCOUNTER — Encounter: Payer: Self-pay | Admitting: Family

## 2022-10-02 DIAGNOSIS — J019 Acute sinusitis, unspecified: Secondary | ICD-10-CM | POA: Diagnosis not present

## 2022-10-02 DIAGNOSIS — B9689 Other specified bacterial agents as the cause of diseases classified elsewhere: Secondary | ICD-10-CM

## 2022-10-02 MED ORDER — AMOXICILLIN-POT CLAVULANATE 875-125 MG PO TABS: 1.0000 | ORAL_TABLET | Freq: Two times a day (BID) | ORAL | 0 refills | Status: DC

## 2022-10-02 MED ORDER — PROMETHAZINE-DM 6.25-15 MG/5ML PO SYRP: 5.0000 mL | ORAL_SOLUTION | Freq: Four times a day (QID) | ORAL | 0 refills | Status: DC | PRN

## 2022-10-02 NOTE — Patient Instructions (Signed)
Martha Hall, thank you for joining Martha Loveless, PA-C for today's virtual visit.  While this provider is not your primary care provider (PCP), if your PCP is located in our provider database this encounter information will be shared with them immediately following your visit.   A [facility] MyChart account gives you access to today's visit and all your visits, tests, and labs performed at [facility] " click here if you don't have a [facility] MyChart account or go to [email or web address]  Consent: (Patient) Martha Hall provided verbal consent for this virtual visit at the beginning of the encounter.  Current Medications:  Current Outpatient Medications:    amoxicillin-clavulanate (AUGMENTIN) 875-125 MG tablet, Take 1 tablet by mouth 2 (two) times daily., Disp: 14 tablet, Rfl: 0   promethazine-dextromethorphan (PROMETHAZINE-DM) 6.25-15 MG/5ML syrup, Take 5 mLs by mouth 4 (four) times daily as needed., Disp: 118 mL, Rfl: 0   albuterol (PROVENTIL) (2.5 MG/3ML) 0.083% nebulizer solution, Take 3 mLs (2.5 mg total) by nebulization every 6 (six) hours as needed for wheezing or shortness of breath., Disp: 150 mL, Rfl: 1   albuterol (VENTOLIN HFA) 108 (90 Base) MCG/ACT inhaler, Inhale 2 puffs into the lungs every 6 (six) hours as needed for wheezing or shortness of breath., Disp: 8 g, Rfl: 2   fluticasone-salmeterol (ADVAIR HFA) 115-21 MCG/ACT inhaler, Inhale 2 puffs into the lungs 2 (two) times daily., Disp: 1 each, Rfl: 12   gabapentin (NEURONTIN) 300 MG capsule, Take 1 capsule (300 mg total) by mouth 3 (three) times daily., Disp: 90 capsule, Rfl: 3   glycopyrrolate (ROBINUL) 1 MG tablet, Take 1 mg by mouth 3 (three) times daily., Disp: , Rfl:    naproxen sodium (ANAPROX DS) 550 MG tablet, Take 1 tablet (550 mg total) by mouth 2 (two) times daily with a meal., Disp: 20 tablet, Rfl: 0   ondansetron (ZOFRAN) 4 MG tablet, Take 1 tablet (4 mg total) by mouth every 8  (eight) hours as needed for nausea or vomiting., Disp: 20 tablet, Rfl: 0   phentermine 15 MG capsule, Take 15 mg by mouth 2 (two) times daily., Disp: , Rfl:    prazosin (MINIPRESS) 1 MG capsule, Take 1 capsule (1 mg total) by mouth at bedtime., Disp: 30 capsule, Rfl: 3   prochlorperazine (COMPAZINE) 10 MG tablet, Take 1 tablet (10 mg total) by mouth 2 (two) times daily as needed for nausea or vomiting., Disp: 10 tablet, Rfl: 0   QUEtiapine (SEROQUEL) 50 MG tablet, Take 1 tablet (50 mg total) by mouth daily., Disp: 30 tablet, Rfl: 3   sertraline (ZOLOFT) 50 MG tablet, Take 1 tablet (50 mg total) by mouth daily., Disp: 30 tablet, Rfl: 3   Spacer/Aero-Holding Chambers (AEROCHAMBER MV) inhaler, Use as instructed (Patient not taking: Reported on 05/23/2022), Disp: 1 each, Rfl: 0   topiramate (TOPAMAX) 25 MG tablet, Take 25 mg by mouth daily., Disp: , Rfl:    Vitamin D, Ergocalciferol, (DRISDOL) 1.25 MG (50000 UNIT) CAPS capsule, Take 1 capsule (50,000 Units total) by mouth every 7 (seven) days., Disp: 12 capsule, Rfl: 0   Medications ordered in this encounter:  Meds ordered this encounter  Medications   amoxicillin-clavulanate (AUGMENTIN) 875-125 MG tablet    Sig: Take 1 tablet by mouth 2 (two) times daily.    Dispense:  14 tablet    Refill:  0    Order Specific Question:   Supervising Provider    Answer:   Merrilee Jansky [id number]   promethazine-dextromethorphan (  PROMETHAZINE-DM) 6.25-15 MG/5ML syrup    Sig: Take 5 mLs by mouth 4 (four) times daily as needed.    Dispense:  118 mL    Refill:  0    Order Specific Question:   Supervising Provider    Answer:   Merrilee Jansky [[id number]]     *If you need refills on other medications prior to your next appointment, please contact your pharmacy*  Follow-Up: Call back or seek an in-person evaluation if the symptoms worsen or if the condition fails to improve as anticipated.  [facility] [phone]  Other Instructions Sinus  Infection, Adult A sinus infection, also called sinusitis, is inflammation of your sinuses. Sinuses are hollow spaces in the bones around your face. Your sinuses are located: Around your eyes. In the middle of your forehead. Behind your nose. In your cheekbones. Mucus normally drains out of your sinuses. When your nasal tissues become inflamed or swollen, mucus can become trapped or blocked. This allows bacteria, viruses, and fungi to grow, which leads to infection. Most infections of the sinuses are caused by a virus. A sinus infection can develop quickly. It can last for up to 4 weeks (acute) or for more than 12 weeks (chronic). A sinus infection often develops after a cold. What are the causes? This condition is caused by anything that creates swelling in the sinuses or stops mucus from draining. This includes: Allergies. Asthma. Infection from bacteria or viruses. Deformities or blockages in your nose or sinuses. Abnormal growths in the nose (nasal polyps). Pollutants, such as chemicals or irritants in the air. Infection from fungi. This is rare. What increases the risk? You are more likely to develop this condition if you: Have a weak body defense system (immune system). Do a lot of swimming or diving. Overuse nasal sprays. Smoke. What are the signs or symptoms? The main symptoms of this condition are pain and a feeling of pressure around the affected sinuses. Other symptoms include: Stuffy nose or congestion that makes it difficult to breathe through your nose. Thick yellow or greenish drainage from your nose. Tenderness, swelling, and warmth over the affected sinuses. A cough that may get worse at night. Decreased sense of smell and taste. Extra mucus that collects in the throat or the back of the nose (postnasal drip) causing a sore throat or bad breath. Tiredness (fatigue). Fever. How is this diagnosed? This condition is diagnosed based on: Your symptoms. Your medical  history. A physical exam. Tests to find out if your condition is acute or chronic. This may include: Checking your nose for nasal polyps. Viewing your sinuses using a device that has a light (endoscope). Testing for allergies or bacteria. Imaging tests, such as an MRI or CT scan. In rare cases, a bone biopsy may be done to rule out more serious types of fungal sinus disease. How is this treated? Treatment for a sinus infection depends on the cause and whether your condition is chronic or acute. If caused by a virus, your symptoms should go away on their own within 10 days. You may be given medicines to relieve symptoms. They include: Medicines that shrink swollen nasal passages (decongestants). A spray that eases inflammation of the nostrils (topical intranasal corticosteroids). Rinses that help get rid of thick mucus in your nose (nasal saline washes). Medicines that treat allergies (antihistamines). Over-the-counter pain relievers. If caused by bacteria, your health care provider may recommend waiting to see if your symptoms improve. Most bacterial infections will get  better without antibiotic medicine. You may be given antibiotics if you have: A severe infection. A weak immune system. If caused by narrow nasal passages or nasal polyps, surgery may be needed. Follow these instructions at home: Medicines Take, use, or apply over-the-counter and prescription medicines only as told by your health care provider. These may include nasal sprays. If you were prescribed an antibiotic medicine, take it as told by your health care provider. Do not stop taking the antibiotic even if you start to feel better. Hydrate and humidify  Drink enough fluid to keep your urine pale yellow. Staying hydrated will help to thin your mucus. Use a cool mist humidifier to keep the humidity level in your home above 50%. Inhale steam for 10-15 minutes, 3-4 times a day, or as told by your health care provider. You  can do this in the bathroom while a hot shower is running. Limit your exposure to cool or dry air. Rest Rest as much as possible. Sleep with your head raised (elevated). Make sure you get enough sleep each night. General instructions  Apply a warm, moist washcloth to your face 3-4 times a day or as told by your health care provider. This will help with discomfort. Use nasal saline washes as often as told by your health care provider. Wash your hands often with soap and water to reduce your exposure to germs. If soap and water are not available, use hand sanitizer. Do not smoke. Avoid being around people who are smoking (secondhand smoke). Keep all follow-up visits. This is important. Contact a health care provider if: You have a fever. Your symptoms get worse. Your symptoms do not improve within 10 days. Get help right away if: You have a severe headache. You have persistent vomiting. You have severe pain or swelling around your face or eyes. You have vision problems. You develop confusion. Your neck is stiff. You have trouble breathing. These symptoms may be an emergency. Get help right away. Call 911. Do not wait to see if the symptoms will go away. Do not drive yourself to the hospital. Summary A sinus infection is soreness and inflammation of your sinuses. Sinuses are hollow spaces in the bones around your face. This condition is caused by nasal tissues that become inflamed or swollen. The swelling traps or blocks the flow of mucus. This allows bacteria, viruses, and fungi to grow, which leads to infection. If you were prescribed an antibiotic medicine, take it as told by your health care provider. Do not stop taking the antibiotic even if you start to feel better. Keep all follow-up visits. This is important. This information is not intended to replace advice given to you by your health care provider. Make sure you discuss any questions you have with your health care  provider. Document Revised: 03/29/2021 Document Reviewed: 03/29/2021 Elsevier Patient Education  2024 Elsevier Inc.    If you have been instructed to have an in-person evaluation today at a local Urgent Care facility, please use the link below. It will take you to a list of all of our available [facility], including address, phone number and hours of operation. Please do not delay care.  [facility]  If you or a family member do not have a primary care provider, use the link below to schedule a visit and establish care. When you choose a [facility] primary care physician or advanced practice provider, you gain a long-term partner in health. Find a Primary Care  Provider  Learn more about [facility]'s in-office and virtual care options: [facility] - Get Care Now

## 2022-10-02 NOTE — Progress Notes (Signed)
Virtual Visit Consent   Martha Hall, you are scheduled for a virtual visit with a [facility] provider today. Just as with appointments in the office, your consent must be obtained to participate. Your consent will be active for this visit and any virtual visit you may have with one of our providers in the next 365 days. If you have a MyChart account, a copy of this consent can be sent to you electronically.  As this is a virtual visit, video technology does not allow for your provider to perform a traditional examination. This may limit your provider's ability to fully assess your condition. If your provider identifies any concerns that need to be evaluated in person or the need to arrange testing (such as labs, EKG, etc.), we will make arrangements to do so. Although advances in technology are sophisticated, we cannot ensure that it will always work on either your end or our end. If the connection with a video visit is poor, the visit may have to be switched to a telephone visit. With either a video or telephone visit, we are not always able to ensure that we have a secure connection.  By engaging in this virtual visit, you consent to the provision of healthcare and authorize for your insurance to be billed (if applicable) for the services provided during this visit. Depending on your insurance coverage, you may receive a charge related to this service.  I need to obtain your verbal consent now. Are you willing to proceed with your visit today? Martha Hall has provided verbal consent on 10/02/2022 for a virtual visit (video or telephone). Martha Loveless, PA-C  Date: 10/02/2022 7:34 PM  Virtual Visit via Video Note   I, Martha Hall, connected with  Martha Hall  ([id number], 08-Jun-1995) on 10/02/22 at  7:30 PM EDT by a video-enabled telemedicine application and verified that I am speaking with the correct person using two identifiers.  Location: Patient: Virtual Visit Location  Patient: Home Provider: Virtual Visit Location Provider: Home Office   I discussed the limitations of evaluation and management by telemedicine and the availability of in person appointments. The patient expressed understanding and agreed to proceed.    History of Present Illness: Martha Hall is a 27 y.o. who identifies as a female who was assigned female at birth, and is being seen today for possible sinus infection.  HPI: Sinusitis This is a new problem. The current episode started 1 to 4 weeks ago (Started 09/27/22). The problem has been gradually worsening since onset. There has been no fever. The pain is moderate. Associated symptoms include congestion, coughing, ear pain (bilateral and at night only), headaches, sinus pressure and a sore throat (started with sore throat but now improved). Pertinent negatives include no chills or shortness of breath. Past treatments include oral decongestants, nasal decongestants and acetaminophen. The treatment provided no relief.     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) 12/15/2021   PTSD (post-traumatic stress disorder) 12/15/2021   GAD (generalized anxiety disorder) 12/15/2021   Prediabetes 06/07/2021   Hospital discharge follow-up 02/04/2021   Choledocholithiasis    Cholelithiasis 01/25/2021   History of hydrocephalus 06/11/2019   History of blood clot in brain 05/28/2019   Alpha thalassemia silent carrier 05/12/2019   Morbid obesity (HCC) 04/30/2019    Allergies:  Allergies  Allergen Reactions   Metoclopramide Hives  and Itching   Medications:  Current Outpatient Medications:    amoxicillin-clavulanate (AUGMENTIN) 875-125 MG tablet, Take 1 tablet by mouth 2 (two) times daily., Disp: 14 tablet, Rfl: 0   promethazine-dextromethorphan (PROMETHAZINE-DM) 6.25-15 MG/5ML syrup, Take 5 mLs by  mouth 4 (four) times daily as needed., Disp: 118 mL, Rfl: 0   albuterol (PROVENTIL) (2.5 MG/3ML) 0.083% nebulizer solution, Take 3 mLs (2.5 mg total) by nebulization every 6 (six) hours as needed for wheezing or shortness of breath., Disp: 150 mL, Rfl: 1   albuterol (VENTOLIN HFA) 108 (90 Base) MCG/ACT inhaler, Inhale 2 puffs into the lungs every 6 (six) hours as needed for wheezing or shortness of breath., Disp: 8 g, Rfl: 2   fluticasone-salmeterol (ADVAIR HFA) 115-21 MCG/ACT inhaler, Inhale 2 puffs into the lungs 2 (two) times daily., Disp: 1 each, Rfl: 12   gabapentin (NEURONTIN) 300 MG capsule, Take 1 capsule (300 mg total) by mouth 3 (three) times daily., Disp: 90 capsule, Rfl: 3   glycopyrrolate (ROBINUL) 1 MG tablet, Take 1 mg by mouth 3 (three) times daily., Disp: , Rfl:    naproxen sodium (ANAPROX DS) 550 MG tablet, Take 1 tablet (550 mg total) by mouth 2 (two) times daily with a meal., Disp: 20 tablet, Rfl: 0   ondansetron (ZOFRAN) 4 MG tablet, Take 1 tablet (4 mg total) by mouth every 8 (eight) hours as needed for nausea or vomiting., Disp: 20 tablet, Rfl: 0   phentermine 15 MG capsule, Take 15 mg by mouth 2 (two) times daily., Disp: , Rfl:    prazosin (MINIPRESS) 1 MG capsule, Take 1 capsule (1 mg total) by mouth at bedtime., Disp: 30 capsule, Rfl: 3   prochlorperazine (COMPAZINE) 10 MG tablet, Take 1 tablet (10 mg total) by mouth 2 (two) times daily as needed for nausea or vomiting., Disp: 10 tablet, Rfl: 0   QUEtiapine (SEROQUEL) 50 MG tablet, Take 1 tablet (50 mg total) by mouth daily., Disp: 30 tablet, Rfl: 3   sertraline (ZOLOFT) 50 MG tablet, Take 1 tablet (50 mg total) by mouth daily., Disp: 30 tablet, Rfl: 3   Spacer/Aero-Holding Chambers (AEROCHAMBER MV) inhaler, Use as instructed (Patient not taking: Reported on 05/23/2022), Disp: 1 each, Rfl: 0   topiramate (TOPAMAX) 25 MG tablet, Take 25 mg by mouth daily., Disp: , Rfl:    Vitamin D, Ergocalciferol, (DRISDOL) 1.25 MG (50000  UNIT) CAPS capsule, Take 1 capsule (50,000 Units total) by mouth every 7 (seven) days., Disp: 12 capsule, Rfl: 0  Observations/Objective: Patient is well-developed, well-nourished in no acute distress.  Resting comfortably at home.  Head is normocephalic, atraumatic.  No labored breathing.  Speech is clear and coherent with logical content.  Patient is alert and oriented at baseline.  Frequent, dry coughing  Assessment and Plan: 1. Acute bacterial sinusitis - amoxicillin-clavulanate (AUGMENTIN) 875-125 MG tablet; Take 1 tablet by mouth 2 (two) times daily.  Dispense: 14 tablet; Refill: 0 - promethazine-dextromethorphan (PROMETHAZINE-DM) 6.25-15 MG/5ML syrup; Take 5 mLs by mouth 4 (four) times daily as needed.  Dispense: 118 mL; Refill: 0  - Worsening symptoms that have not responded to OTC medications.  - Will give Augmentin and Promethazine DM - Continue asthma medications as prescribed - Continue allergy medications.  - Steam and humidifier can help - Stay well hydrated and get plenty of rest.  - Seek in person evaluation if no symptom improvement or if symptoms worsen   Follow Up Instructions: I discussed the assessment and treatment plan with the patient.  The patient was provided an opportunity to ask questions and all were answered. The patient agreed with the plan and demonstrated an understanding of the instructions.  A copy of instructions were sent to the patient via MyChart unless otherwise noted below.    The patient was advised to call back or seek an in-person evaluation if the symptoms worsen or if the condition fails to improve as anticipated.  Time:  I spent 10 minutes with the patient via telehealth technology discussing the above problems/concerns.    Martha Loveless, PA-C

## 2022-10-03 ENCOUNTER — Other Ambulatory Visit: Payer: Self-pay | Admitting: Family

## 2022-10-03 DIAGNOSIS — J321 Chronic frontal sinusitis: Secondary | ICD-10-CM

## 2022-10-03 DIAGNOSIS — J341 Cyst and mucocele of nose and nasal sinus: Secondary | ICD-10-CM

## 2022-10-03 NOTE — Telephone Encounter (Signed)
ENT referral complete.

## 2022-10-13 ENCOUNTER — Ambulatory Visit (INDEPENDENT_AMBULATORY_CARE_PROVIDER_SITE_OTHER): Payer: 59 | Admitting: Obstetrics and Gynecology

## 2022-10-13 ENCOUNTER — Other Ambulatory Visit (HOSPITAL_COMMUNITY)
Admission: RE | Admit: 2022-10-13 | Discharge: 2022-10-13 | Disposition: A | Discharge status: Home / Self Care | Payer: 59 | Source: Ambulatory Visit | Attending: Obstetrics and Gynecology | Admitting: Obstetrics and Gynecology

## 2022-10-13 ENCOUNTER — Encounter: Payer: Self-pay | Admitting: Obstetrics and Gynecology

## 2022-10-13 VITALS — BP 112/67 | HR 76 | Ht 60.0 in | Wt 222.0 lb

## 2022-10-13 DIAGNOSIS — N644 Mastodynia: Secondary | ICD-10-CM | POA: Diagnosis not present

## 2022-10-13 DIAGNOSIS — N6452 Nipple discharge: Secondary | ICD-10-CM | POA: Diagnosis not present

## 2022-10-13 DIAGNOSIS — Z01419 Encounter for gynecological examination (general) (routine) without abnormal findings: Secondary | ICD-10-CM

## 2022-10-13 DIAGNOSIS — Z124 Encounter for screening for malignant neoplasm of cervix: Secondary | ICD-10-CM | POA: Diagnosis not present

## 2022-10-13 DIAGNOSIS — Z1339 Encounter for screening examination for other mental health and behavioral disorders: Secondary | ICD-10-CM

## 2022-10-13 DIAGNOSIS — Z01411 Encounter for gynecological examination (general) (routine) with abnormal findings: Secondary | ICD-10-CM

## 2022-10-13 DIAGNOSIS — R350 Frequency of micturition: Secondary | ICD-10-CM

## 2022-10-13 NOTE — Progress Notes (Signed)
ANNUAL EXAM Patient name: Martha Hall MRN [id number]  Date of birth: [birth year] Chief Complaint:   Gynecologic Exam  History of Present Illness:   Martha Hall is a 27 y.o. G1P1001 being seen today for a routine annual exam.  Current complaints: breast/nipple discomfort and discharge, urinary frequency   Menstrual concerns? No   Breast or nipple changes? Yes  Contraception use? No   Having frequent urination and breast tenderness for 3 months Doesn't hurt to void, no changes in urine color, no blood in urine but feels she is going to the bathroom every 10-20 minutes Drinking: 6-8 bottles; 0 sugar soda water 1-2x a week, no caffeine Mild coming from the breast as well, bilaterally for the last 3 months  No changes in period No changes in weight  Has maybe been more tired than usual, working 2 jobs but feels sleep has ben decent Pain has been in both breast, more towards the nipple - moreso tendernderss Started priro to the weight loss medication   Patient's last menstrual period was 09/16/2022 (approximate).   The pregnancy intention screening data noted above was reviewed. Potential methods of contraception were discussed. The patient elected to proceed with No data recorded.   Last pap     Component Value Date/Time   DIAGPAP  04/30/2019 1021    - Negative for intraepithelial lesion or malignancy (NILM)   ADEQPAP  04/30/2019 1021    Satisfactory for evaluation; transformation zone component PRESENT.   Last mammogram: none Last colonoscopy: n/a     10/13/2022   10:16 AM 07/05/2022    1:14 PM 03/09/2022    8:54 AM 12/21/2021    3:36 PM 12/15/2021    2:38 PM  Depression screen PHQ 2/9  Decreased Interest 1 0  3   Down, Depressed, Hopeless 0 0  3   PHQ - 2 Score 1 0  6   Altered sleeping 1   3   Tired, decreased energy 1   2   Change in appetite 1   3   Feeling bad or failure about yourself  1   1   Trouble concentrating 0   3   Moving slowly or fidgety/restless  0   0   Suicidal thoughts 0   0   PHQ-9 Score 5   18   Difficult doing work/chores    Extremely dIfficult      Information is confidential and restricted. Go to Review Flowsheets to unlock data.        10/13/2022   10:16 AM 03/09/2022    8:55 AM 12/15/2021    2:40 PM 12/14/2021   11:29 AM  GAD 7 : Generalized Anxiety Score  Nervous, Anxious, on Edge 2     Control/stop worrying 3     Worry too much - different things 3     Trouble relaxing 2     Restless 1     Easily annoyed or irritable 3     Afraid - awful might happen 1     Total GAD 7 Score 15     Anxiety Difficulty         Information is confidential and restricted. Go to Review Flowsheets to unlock data.     Review of Systems:   Pertinent items are noted in HPI Denies any headaches, blurred vision, fatigue, shortness of breath, chest pain, abdominal pain, abnormal vaginal discharge/itching/odor/irritation, problems with periods, bowel movements, urination, or intercourse unless otherwise stated above. Pertinent History Reviewed:  Reviewed  past medical,surgical, social and family history.  Reviewed problem list, medications and allergies. Physical Assessment:   Vitals:   10/13/22 1013  BP: 112/67  Pulse: 76  Weight: 222 lb (100.7 kg)  Height: 5' (1.524 m)  Body mass index is 43.36 kg/m.        Physical Examination:   General appearance - well appearing, and in no distress  Mental status - alert, oriented to person, place, and time  Psych:  She has a normal mood and affect  Skin - warm and dry, normal color, no suspicious lesions noted  Chest - effort normal, all lung fields clear to auscultation bilaterally  Heart - normal rate and regular rhythm  Breasts - breasts appear normal, no suspicious masses, no skin or nipple changes or  axillary nodes  Abdomen - soft, nontender, nondistended, no masses or organomegaly  Pelvic -  VULVA: normal appearing vulva with no masses, tenderness or lesions   VAGINA: normal  appearing vagina with normal color and discharge, no lesions CERVIX: normal appearing cervix without discharge or lesions, no CMT  Thin prep pap is done with HR HPV cotesting  UTERUS: uterus is felt to be normal size, shape, consistency and nontender   ADNEXA: No adnexal masses or tenderness noted.  Extremities:  No swelling or varicosities noted  Chaperone present for exam  No results found for this or any previous visit (from the past 24 hour(s)).    Assessment & Plan:  1. Well woman exam with routine gynecological exam - Cervical cancer screening: Discussed screening Q3 years. Reviewed importance of annual exams and limits of pap smear. Pap with reflex HPV collected - GC/CT: Discussed and recommended. Pt  declines - Breast Health: Encouraged self breast awareness/exams.  - Follow-up: 12 months and prn  2. Screening for cervical cancer collected - Cytology - PAP  3. Urinary frequency Urine culture collected due to persistent urinary frequency.  - Urine Culture  4. Nipple discharge Bilateral pain and discharge, will plan for bilateral breast imaging and repeat prolactin.  - [place] LIMITED ULTRASOUND INCLUDING AXILLA LEFT BREAST ; Future - [place] LIMITED ULTRASOUND INCLUDING AXILLA RIGHT BREAST; Future - Prolactin  5. Mastalgia in female - [place] LIMITED ULTRASOUND INCLUDING AXILLA LEFT BREAST ; Future - [place] LIMITED ULTRASOUND INCLUDING AXILLA RIGHT BREAST; Future - HgB A1c    No orders of the defined types were placed in this encounter.   Meds: No orders of the defined types were placed in this encounter.   Follow-up: No follow-ups on file.  Lorriane Shire, MD 10/13/2022 10:34 AM

## 2022-10-13 NOTE — Progress Notes (Signed)
Declined STI testing.  

## 2022-10-14 LAB — HEMOGLOBIN A1C
Est. average glucose Bld gHb Est-mCnc: 111 mg/dL
Hgb A1c MFr Bld: 5.5 % (ref 4.8–5.6)

## 2022-10-14 LAB — PROLACTIN: Prolactin: 12.2 ng/mL (ref 4.8–33.4)

## 2022-10-16 LAB — URINE CULTURE

## 2022-10-20 LAB — CYTOLOGY - PAP
Comment: NEGATIVE
Diagnosis: UNDETERMINED — AB
High risk HPV: NEGATIVE

## 2022-10-23 ENCOUNTER — Ambulatory Visit
Admission: RE | Admit: 2022-10-23 | Discharge: 2022-10-23 | Disposition: A | Discharge status: Home / Self Care | Payer: Medicaid Other | Source: Ambulatory Visit | Attending: Obstetrics and Gynecology | Admitting: Obstetrics and Gynecology

## 2022-10-23 DIAGNOSIS — N644 Mastodynia: Secondary | ICD-10-CM

## 2022-10-23 DIAGNOSIS — N6452 Nipple discharge: Secondary | ICD-10-CM

## 2022-10-24 ENCOUNTER — Encounter: Payer: Self-pay | Admitting: Obstetrics and Gynecology

## 2022-10-24 DIAGNOSIS — N6452 Nipple discharge: Secondary | ICD-10-CM

## 2022-10-24 DIAGNOSIS — N644 Mastodynia: Secondary | ICD-10-CM

## 2022-10-25 ENCOUNTER — Other Ambulatory Visit: Payer: Self-pay | Admitting: Obstetrics and Gynecology

## 2022-10-25 NOTE — Addendum Note (Signed)
Addended by: Harlon Ditty on: 10/25/2022 01:57 PM   Modules accepted: Orders

## 2022-10-27 ENCOUNTER — Ambulatory Visit (INDEPENDENT_AMBULATORY_CARE_PROVIDER_SITE_OTHER): Payer: 59

## 2022-10-27 DIAGNOSIS — R350 Frequency of micturition: Secondary | ICD-10-CM

## 2022-10-27 DIAGNOSIS — N644 Mastodynia: Secondary | ICD-10-CM

## 2022-10-27 DIAGNOSIS — N6452 Nipple discharge: Secondary | ICD-10-CM

## 2022-10-27 LAB — POCT URINALYSIS DIPSTICK
Bilirubin, UA: NEGATIVE
Blood, UA: NEGATIVE
Glucose, UA: NEGATIVE
Ketones, UA: POSITIVE
Leukocytes, UA: NEGATIVE
Nitrite, UA: NEGATIVE
Protein, UA: POSITIVE — AB
Spec Grav, UA: >=1.03 — AB (ref 1.010–1.025)
Urobilinogen, UA: 0.2 E.U./dL
pH, UA: 6 (ref 5.0–8.0)

## 2022-10-27 NOTE — Progress Notes (Signed)
SUBJECTIVE: Martha Hall is a 27 y.o. female who complains of urinary frequency x 14 days, without flank pain, fever, chills, or abnormal vaginal discharge or bleeding.   OBJECTIVE: Appears well, in no apparent distress.  Vital signs are normal. Urine dipstick shows positive for ketones.    ASSESSMENT: Dysuria  PLAN: Treatment per orders.  Call or return to clinic prn if these symptoms worsen or fail to improve as anticipated.   Martha Hall l Martha Hall, CMA

## 2022-10-28 LAB — T3, FREE: T3, Free: 3.9 pg/mL (ref 2.0–4.4)

## 2022-10-28 LAB — TSH: TSH: 2.01 u[IU]/mL (ref 0.450–4.500)

## 2022-10-28 LAB — T4, FREE: Free T4: 1.55 ng/dL (ref 0.82–1.77)

## 2022-10-28 LAB — BETA HCG QUANT (REF LAB): hCG Quant: <1 m[IU]/mL

## 2022-10-30 ENCOUNTER — Other Ambulatory Visit: Payer: Self-pay | Admitting: Obstetrics and Gynecology

## 2022-10-30 ENCOUNTER — Encounter: Payer: Self-pay | Admitting: Obstetrics and Gynecology

## 2022-10-30 DIAGNOSIS — N6452 Nipple discharge: Secondary | ICD-10-CM

## 2022-10-30 DIAGNOSIS — N644 Mastodynia: Secondary | ICD-10-CM

## 2022-10-30 LAB — URINE CULTURE

## 2022-11-03 NOTE — Addendum Note (Signed)
Addended by: Harlon Ditty on: 11/03/2022 12:55 PM   Modules accepted: Orders

## 2022-11-21 DIAGNOSIS — R635 Abnormal weight gain: Secondary | ICD-10-CM | POA: Diagnosis not present

## 2022-11-21 DIAGNOSIS — E559 Vitamin D deficiency, unspecified: Secondary | ICD-10-CM | POA: Diagnosis not present

## 2022-11-21 DIAGNOSIS — R0602 Shortness of breath: Secondary | ICD-10-CM | POA: Diagnosis not present

## 2022-11-21 DIAGNOSIS — Z3200 Encounter for pregnancy test, result unknown: Secondary | ICD-10-CM | POA: Diagnosis not present

## 2022-11-21 DIAGNOSIS — E7800 Pure hypercholesterolemia, unspecified: Secondary | ICD-10-CM | POA: Diagnosis not present

## 2022-11-21 DIAGNOSIS — Z6841 Body Mass Index (BMI) 40.0 and over, adult: Secondary | ICD-10-CM | POA: Diagnosis not present

## 2022-11-21 DIAGNOSIS — R5381 Other malaise: Secondary | ICD-10-CM | POA: Diagnosis not present

## 2022-11-21 DIAGNOSIS — R5383 Other fatigue: Secondary | ICD-10-CM | POA: Diagnosis not present

## 2022-11-21 DIAGNOSIS — E6609 Other obesity due to excess calories: Secondary | ICD-10-CM | POA: Diagnosis not present

## 2022-11-21 DIAGNOSIS — F32A Depression, unspecified: Secondary | ICD-10-CM | POA: Diagnosis not present

## 2022-11-21 DIAGNOSIS — F419 Anxiety disorder, unspecified: Secondary | ICD-10-CM | POA: Diagnosis not present

## 2022-11-27 ENCOUNTER — Encounter: Payer: Self-pay | Admitting: *Deleted

## 2022-11-27 ENCOUNTER — Ambulatory Visit
Admission: EM | Admit: 2022-11-27 | Discharge: 2022-11-27 | Disposition: A | Discharge status: Home / Self Care | Payer: 59 | Attending: Nurse Practitioner | Admitting: Nurse Practitioner

## 2022-11-27 ENCOUNTER — Other Ambulatory Visit: Payer: Self-pay

## 2022-11-27 DIAGNOSIS — Z1152 Encounter for screening for COVID-19: Secondary | ICD-10-CM | POA: Diagnosis not present

## 2022-11-27 DIAGNOSIS — J069 Acute upper respiratory infection, unspecified: Secondary | ICD-10-CM | POA: Diagnosis not present

## 2022-11-27 MED ORDER — BENZONATATE 100 MG PO CAPS: 100.0000 mg | ORAL_CAPSULE | Freq: Three times a day (TID) | ORAL | 0 refills | Status: DC | PRN

## 2022-11-27 NOTE — ED Provider Notes (Signed)
[name]    CSN: [id number] Arrival date & time: 11/27/22  1321      History   Chief Complaint Chief Complaint  Patient presents with   Cough   Shortness of Breath    [name] is a 27 y.o. female.   Patient presents today for 2 day history of congested cough with thick green mucus, shortness of breath with coughing, rib cage pain from coughing, and chest pain from coughing.  No fever, body aches, or chills.  Patient's nose is runny and stuffy, throat is slightly sore, and head hurts.  No ear pain, abdominal pain, nausea/vomiting, and diarrhea.  Appetite has been normal.  Has been taking Mucinex and using albuterol inhaler with minimal improvement in symptoms.  Still taking Advair twice daily for asthma.  No known sick contacts.   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    LMP earlier this month.    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 Per consultation with Dr. Earlene Plater, no further work-up necessary. Per MFM: " The patient was reassured that the anti-Lewis antibodies are usually of the IgM subtype and therefore we will not cross the placenta and cause fetal anemia.  The Lewis antibodies should therefore not affect the management of her current pregnancy."   PTSD (post-traumatic stress disorder)    Vaginal trichomoniasis 05/09/2019   04/30/2019 on Pap [] tx [] TOC- neg   Vitamin D deficiency     Patient Active Problem List   Diagnosis Date Noted   Acanthosis nigricans 07/05/2022   Hyperhidrosis 07/04/2022   Moderate episode of recurrent major depressive disorder (HCC) 12/15/2021   PTSD (post-traumatic  stress disorder) 12/15/2021   GAD (generalized anxiety disorder) 12/15/2021   Prediabetes 06/07/2021   Hospital discharge follow-up 02/04/2021   Choledocholithiasis    Cholelithiasis 01/25/2021   History of hydrocephalus 06/11/2019   History of blood clot in brain 05/28/2019   Alpha thalassemia silent carrier 05/12/2019   Morbid obesity (HCC) 04/30/2019    Past Surgical History:  Procedure Laterality Date   BRAIN SURGERY  04/27/1998   stint placed when born   CHOLECYSTECTOMY N/A 01/25/2021   Procedure: LAPAROSCOPIC CHOLECYSTECTOMY WITH ATTEMPTED CHOLANGIOGRAM;  Surgeon: Abigail Miyamoto, MD;  Location: [facility] OR;  Service: General;  Laterality: N/A;   ERCP N/A 01/29/2021   Procedure: ENDOSCOPIC RETROGRADE CHOLANGIOPANCREATOGRAPHY (ERCP);  Surgeon: Iva Boop, MD;  Location: [facility] ENDOSCOPY;  Service: Endoscopy;  Laterality: N/A;   IR RADIOLOGIST EVAL & MGMT  03/23/2021   REMOVAL OF STONES  01/29/2021   Procedure: REMOVAL OF STONES;  Surgeon: Iva Boop, MD;  Location: [facility] ENDOSCOPY;  Service: Endoscopy;;   SPHINCTEROTOMY  01/29/2021   Procedure: Dennison Mascot;  Surgeon: Iva Boop, MD;  Location: [facility] ENDOSCOPY;  Service: Endoscopy;;    OB History     Gravida  1   Para  1   Term  1   Preterm      AB  0   Living  1      SAB  0   IAB      Ectopic      Multiple  0   Live Births  1            Home Medications    Prior to Admission medications   Medication Sig Start Date End Date Taking? Authorizing Provider  albuterol (VENTOLIN HFA) 108 (90 Base) MCG/ACT inhaler Inhale 2 puffs into the lungs every 6 (six) hours as needed for wheezing or shortness of breath. 04/04/22  Yes Zonia Kief, Amy J, NP  benzonatate (TESSALON) 100 MG capsule Take 1 capsule (100 mg total) by mouth 3 (three) times daily as needed for cough. 11/27/22  Yes Valentino Nose, NP  fluticasone-salmeterol (ADVAIR HFA) [phone] MCG/ACT inhaler Inhale 2 puffs into the lungs 2 (two) times daily.  05/23/22  Yes Lupita Leash, MD  gabapentin (NEURONTIN) 300 MG capsule Take 1 capsule (300 mg total) by mouth 3 (three) times daily. 03/09/22  Yes Toy Cookey E, NP  glycopyrrolate (ROBINUL) 1 MG tablet Take 1 mg by mouth 3 (three) times daily. 07/12/22  Yes [provider]  phentermine 15 MG capsule Take 15 mg by mouth 2 (two) times daily. 08/01/22  Yes [provider]  prazosin (MINIPRESS) 1 MG capsule Take 1 capsule (1 mg total) by mouth at bedtime. 03/09/22  Yes Toy Cookey E, NP  sertraline (ZOLOFT) 50 MG tablet Take 1 tablet (50 mg total) by mouth daily. 03/09/22  Yes Toy Cookey E, NP  topiramate (TOPAMAX) 25 MG tablet Take 25 mg by mouth daily. 07/30/22  Yes [provider]  albuterol (PROVENTIL) (2.5 MG/3ML) 0.083% nebulizer solution Take 3 mLs (2.5 mg total) by nebulization every 6 (six) hours as needed for wheezing or shortness of breath. 04/04/22   Rema Fendt, NP  naproxen sodium (ANAPROX DS) 550 MG tablet Take 1 tablet (550 mg total) by mouth 2 (two) times daily with a meal. Patient not taking: Reported on 10/13/2022 08/10/22   Rema Fendt, NP  ondansetron (ZOFRAN) 4 MG tablet Take 1 tablet (4 mg total) by mouth every 8 (eight) hours as needed for nausea or vomiting. Patient not taking: Reported on 10/13/2022 08/08/22   Ellsworth Lennox, PA-C  prochlorperazine (COMPAZINE) 10 MG tablet Take 1 tablet (10 mg total) by mouth 2 (two) times daily as needed for nausea or vomiting. Patient not taking: Reported on 10/13/2022 09/26/22   Charlynne Pander, MD  promethazine-dextromethorphan (PROMETHAZINE-DM) 6.25-15 MG/5ML syrup Take 5 mLs by mouth 4 (four) times daily as needed. Patient not taking: Reported on 10/13/2022 10/02/22   [name], PA-C  QUEtiapine (SEROQUEL) 50 MG tablet Take 1 tablet (50 mg total) by mouth daily. 03/09/22   Shanna Cisco, NP  Spacer/Aero-Holding Deretha Emory (AEROCHAMBER MV) inhaler Use as instructed Patient not taking:  Reported on 05/23/2022 04/12/22   Lupita Leash, MD  Vitamin D, Ergocalciferol, (DRISDOL) 1.25 MG (50000 UNIT) CAPS capsule Take 1 capsule (50,000 Units total) by mouth every 7 (seven) days. Patient not taking: Reported on 10/13/2022 07/13/22   Roma Kayser, MD  diphenhydrAMINE (BENADRYL) 25 MG tablet Take 12.5 mg by mouth every 6 (six) hours as needed for allergies. Patient not taking: Reported on 12/30/2019  06/21/20  [provider]    Family History Family History  Problem Relation Age of Onset   Healthy Mother    Healthy Father     Social History Social History   Tobacco Use   Smoking status: Never    Passive exposure: Never   Smokeless tobacco: Never  Vaping Use   Vaping status: Never Used  Substance Use Topics   Alcohol use: Never   Drug use: Never     Allergies   Metoclopramide   Review of Systems Review of Systems Per HPI  Physical Exam Triage Vital Signs ED Triage Vitals  Encounter Vitals Group     BP 11/27/22 1329 117/80     Systolic BP Percentile --      Diastolic BP Percentile --      Pulse Rate 11/27/22 1329 75     Resp 11/27/22 1329 16     Temp 11/27/22 1329 98.7 F (37.1 C)     Temp Source 11/27/22 1329 Oral     SpO2 11/27/22 1329 98 %     Weight --      Height --      Head Circumference --      Peak Flow --      Pain Score 11/27/22 1331 7     Pain Loc --      Pain Education --      Exclude from Growth Chart --    No data found.  Updated Vital Signs BP 117/80   Pulse 75   Temp 98.7 F (37.1 C) (Oral)   Resp 16   LMP 11/10/2022 (Exact Date)   SpO2 98%   Visual Acuity Right Eye Distance:   Left Eye Distance:   Bilateral Distance:    Right Eye Near:   Left Eye Near:    Bilateral Near:     Physical Exam Vitals and nursing note reviewed.  Constitutional:      General: She is not in acute distress.    Appearance: Normal appearance. She is not ill-appearing or toxic-appearing.  HENT:     Head: Normocephalic  and atraumatic.     Right Ear: Tympanic membrane, ear canal and external ear normal.     Left Ear: Tympanic membrane, ear canal and external ear normal.     Nose: No congestion or rhinorrhea.     Mouth/Throat:     Mouth: Mucous membranes are moist.     Pharynx: Oropharynx is clear. Posterior oropharyngeal erythema present. No oropharyngeal exudate.  Eyes:     General: No scleral icterus.    Extraocular Movements: Extraocular movements intact.  Cardiovascular:     Rate and Rhythm: Normal rate and regular rhythm.  Pulmonary:     Effort: Pulmonary effort is normal. No respiratory distress.     Breath sounds: Decreased breath sounds (poor effort, patient talking in complete sentences without accessory muscle use) present. No wheezing, rhonchi or rales.  Abdominal:     General: Abdomen is flat. Bowel sounds are normal. There is no distension.     Palpations: Abdomen is soft.  Musculoskeletal:     Cervical back: Normal range of motion and neck supple.  Lymphadenopathy:     Cervical: No cervical adenopathy.  Skin:    General: Skin is warm and dry.     Coloration: Skin is not jaundiced or pale.     Findings: No erythema or rash.  Neurological:     Mental Status: She is alert and oriented to person, place, and time.  Psychiatric:        Behavior: Behavior is cooperative.      UC Treatments / Results  Labs (all labs ordered are listed, but only abnormal results are displayed) Labs Reviewed  SARS CORONAVIRUS 2 (TAT 6-24 HRS)    EKG   Radiology No results found.  Procedures Procedures (including critical care time)  Medications Ordered  in UC Medications - No data to display  Initial Impression / Assessment and Plan / UC Course  I have reviewed the triage vital signs and the nursing notes.  Pertinent labs & imaging results that were available during my care of the patient were reviewed by me and considered in my medical decision making (see chart for details).   Patient  is well-appearing, normotensive, afebrile, not tachycardic, not tachypneic, oxygenating well on room air.    1. Viral URI with cough 2. Encounter for screening for COVID-19 Suspect viral etiology  Vitals and examination today are reassuring COVID-19 testing obtained Supportive care discussed Recommended starting allergy medicine daily for sinus infection prevention  Strict ER and return precautions discussed  The patient was given the opportunity to ask questions.  All questions answered to their satisfaction.  The patient is in agreement to this plan.    Final Clinical Impressions(s) / UC Diagnoses   Final diagnoses:  Viral URI with cough  Encounter for screening for COVID-19     Discharge Instructions      Please start taking Claritin and Flonase daily  You have a viral upper respiratory infection.  Symptoms should improve over the next week to 10 days.  If you develop chest pain or shortness of breath, go to the emergency room.    If symptoms do not improve in this timeframe, follow up with PCP.  We have tested you today for COVID-19.  You will see the results in Mychart and we will call you with positive results.  Please stay home and isolate until you are aware of the results.    Some things that can make you feel better are: - Increased rest - Increasing fluid with water/sugar free electrolytes - Acetaminophen and ibuprofen as needed for fever/pain - Salt water gargling, chloraseptic spray and throat lozenges - OTC guaifenesin (Mucinex) 600 mg twice daily for congestion - Saline sinus flushes or a neti pot - Humidifying the air -Tessalon Perles every 8 hours as needed for dry cough      ED Prescriptions     Medication Sig Dispense Auth. Provider   benzonatate (TESSALON) 100 MG capsule Take 1 capsule (100 mg total) by mouth 3 (three) times daily as needed for cough. 21 capsule Valentino Nose, NP      PDMP not reviewed this encounter.   Valentino Nose, NP 11/27/22 1420

## 2022-11-27 NOTE — Discharge Instructions (Addendum)
Please start taking Claritin and Flonase daily  You have a viral upper respiratory infection.  Symptoms should improve over the next week to 10 days.  If you develop chest pain or shortness of breath, go to the emergency room.    If symptoms do not improve in this timeframe, follow up with PCP.  We have tested you today for COVID-19.  You will see the results in Mychart and we will call you with positive results.  Please stay home and isolate until you are aware of the results.    Some things that can make you feel better are: - Increased rest - Increasing fluid with water/sugar free electrolytes - Acetaminophen and ibuprofen as needed for fever/pain - Salt water gargling, chloraseptic spray and throat lozenges - OTC guaifenesin (Mucinex) 600 mg twice daily for congestion - Saline sinus flushes or a neti pot - Humidifying the air -Tessalon Perles every 8 hours as needed for dry cough

## 2022-11-27 NOTE — ED Triage Notes (Signed)
C/O cough with greenish sputum, dyspnea, "consistent" upper chest and "my rib area on both sides" onset 2 days ago. Yesterday after nap had "puffy face". C/O "neck pain and head" pain. Denies fevers. C/O occasional runny nose. Has taken IBU and Tyl, and Mucinex. States "I'm going through this every 2 months".

## 2022-11-28 LAB — SARS CORONAVIRUS 2 (TAT 6-24 HRS): SARS Coronavirus 2: NEGATIVE

## 2022-12-01 ENCOUNTER — Ambulatory Visit (HOSPITAL_BASED_OUTPATIENT_CLINIC_OR_DEPARTMENT_OTHER): Payer: 59

## 2022-12-01 ENCOUNTER — Telehealth (HOSPITAL_COMMUNITY): Payer: Self-pay | Admitting: Emergency Medicine

## 2022-12-01 ENCOUNTER — Encounter (HOSPITAL_BASED_OUTPATIENT_CLINIC_OR_DEPARTMENT_OTHER): Payer: Self-pay

## 2022-12-01 ENCOUNTER — Encounter (HOSPITAL_BASED_OUTPATIENT_CLINIC_OR_DEPARTMENT_OTHER): Payer: Self-pay | Admitting: Pulmonary Disease

## 2022-12-01 ENCOUNTER — Ambulatory Visit (INDEPENDENT_AMBULATORY_CARE_PROVIDER_SITE_OTHER): Payer: 59 | Admitting: Pulmonary Disease

## 2022-12-01 VITALS — BP 96/64 | HR 92 | Temp 98.5°F | Resp 19 | Ht 60.0 in | Wt 202.0 lb

## 2022-12-01 DIAGNOSIS — R053 Chronic cough: Secondary | ICD-10-CM | POA: Diagnosis not present

## 2022-12-01 MED ORDER — PREDNISONE 10 MG PO TABS: ORAL_TABLET | ORAL | 0 refills | Status: AC

## 2022-12-01 MED ORDER — AZITHROMYCIN 250 MG PO TABS: ORAL_TABLET | ORAL | 0 refills | Status: AC

## 2022-12-01 NOTE — Patient Instructions (Signed)
Chest xray and lab work today  Prednisone 10 mg pill >> 3 pills daily for 2 days, 2 pills daily for 2 days, 1 pill daily for 2 days  Zithromax 250 mg pill >> 2 pills on day 1, then 1 pill daily for the next 4 days  Follow up in 8 weeks

## 2022-12-01 NOTE — Telephone Encounter (Signed)
Patient called front desk asking for results review Attempted to reach patient x 1, LVM

## 2022-12-01 NOTE — Progress Notes (Signed)
[facility]  Name: Martha Hall MRN: [id number] DOB: 1995/10/07  Chief Complaint  Patient presents with   URI    Chest pain with cough  about 6 days , shortness of breath  , took otc medication , pain on both sides     Summary: 27 yr old female previous followed by Dr. Kendrick Fries for asthma.  Subjective: She has been getting episodes of bronchitis every couple of months since October 2023.  Most recent episode started about 1 week ago.  She has chest congestion, wheezing, cough with green sputum, and tightness in her chest.  She feels like it is harder to breath when she does simple activities.  No fever.  Sinuses okay.  Not having nausea, diarrhea, or skin rash.  Gets coughing spells at night sometimes.  She uses advair twice per day.  Uses albuterol several times per day.  Helps but doesn't last.  Has been using her nebulizer also.  No history of smoking.  No animal/bird exposures.  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  Vital signs: BP 96/64   Pulse 92   Temp 98.5 F (36.9 C)   Resp 19   Ht 5' (1.524 m)   Wt 202 lb (91.6 kg)   LMP 11/10/2022 (Exact Date)   SpO2 97%   BMI 39.45 kg/m   Physical exam:  General - alert ENT - no sinus tenderness, no stridor, +2 tonsils Cardiac - regular rate/rhythm, no murmur Chest - scattered rhonchi Extremities - no cyanosis, clubbing, or edema Skin - no rashes Psych - normal mood and behavior Lymphatics - no lymphadenopathy  Studies: PFT 05/23/22 >> FEV1 2.73 (87%), FEV1% 88, TLC 3.98 (82%), DLCO  105%  Assessment/plan:  Chronic cough with acute asthmatic bronchitis and history of severe, persistent allergic asthma. - chest xray, CBC diff, IgE today - will give her course of zithromax and prednisone - continue advair 115 two puffs bid; might need to add leukotriene inhibitor and/or LAMA if symptoms persist - prn albuterol - she has a nebulizer machine - will keep her out of work until 12/06/22  Allergic rhinitis. - seen previously by Dr. Cheron Schaumann with ENT - prn OTC flonase, antihistamine  Patient Instructions  Chest xray and lab work today  Prednisone 10 mg pill >> 3 pills daily for 2 days, 2 pills daily for 2 days, 1 pill daily for 2 days  Zithromax 250 mg pill >> 2 pills on day 1, then 1 pill daily for the next 4 days  Follow up in 8 weeks  Allergies as of 12/01/2022       Reactions   Metoclopramide Hives, Itching        Medication List        Accurate as of December 01, 2022  2:09 PM. If you have any questions, ask your nurse or doctor.          STOP taking these medications    benzonatate 100 MG capsule Commonly known as: TESSALON Stopped by: Adriauna Campton   ondansetron 4 MG tablet Commonly known as: Zofran Stopped by: Coralyn Helling   prochlorperazine 10 MG tablet Commonly known as: COMPAZINE Stopped by: Coralyn Helling   Vitamin D (  Ergocalciferol) 1.25 MG (50000 UNIT) Caps capsule Commonly known as: DRISDOL Stopped by: Kaiana Marion       TAKE these medications    AeroChamber MV inhaler Use as instructed   albuterol (2.5 MG/3ML) 0.083% nebulizer solution Commonly known as: PROVENTIL Take 3 mLs (2.5 mg total) by nebulization every 6 (six) hours as needed for wheezing or shortness of breath.   albuterol 108 (90 Base) MCG/ACT inhaler Commonly known as: VENTOLIN HFA Inhale 2 puffs into the lungs every 6 (six) hours as needed for wheezing or shortness of breath.   azithromycin 250 MG tablet Commonly known as: ZITHROMAX Take 2 tablets (500  mg total) by mouth daily for 1 day, THEN 1 tablet (250 mg total) daily for 4 days. Start taking on: December 01, 2022 Started by: Coralyn Helling   fluticasone-salmeterol 875-64 MCG/ACT inhaler Commonly known as: Advair HFA Inhale 2 puffs into the lungs 2 (two) times daily.   gabapentin 300 MG capsule Commonly known as: Neurontin Take 1 capsule (300 mg total) by mouth 3 (three) times daily.   glycopyrrolate 1 MG tablet Commonly known as: ROBINUL Take 1 mg by mouth 3 (three) times daily.   naproxen sodium 550 MG tablet Commonly known as: Anaprox DS Take 1 tablet (550 mg total) by mouth 2 (two) times daily with a meal.   phentermine 15 MG capsule Take 15 mg by mouth 2 (two) times daily.   prazosin 1 MG capsule Commonly known as: MINIPRESS Take 1 capsule (1 mg total) by mouth at bedtime.   predniSONE 10 MG tablet Commonly known as: DELTASONE Take 3 tablets (30 mg total) by mouth daily with breakfast for 2 days, THEN 2 tablets (20 mg total) daily with breakfast for 2 days, THEN 1 tablet (10 mg total) daily with breakfast for 2 days. Start taking on: December 01, 2022 Started by: Coralyn Helling   promethazine-dextromethorphan 6.25-15 MG/5ML syrup Commonly known as: PROMETHAZINE-DM Take 5 mLs by mouth 4 (four) times daily as needed.   QUEtiapine 50 MG tablet Commonly known as: SEROQUEL Take 1 tablet (50 mg total) by mouth daily.   sertraline 50 MG tablet Commonly known as: ZOLOFT Take 1 tablet (50 mg total) by mouth daily.   topiramate 25 MG tablet Commonly known as: TOPAMAX Take 25 mg by mouth daily.        Time spent: 38 minutes  Signature: Coralyn Helling, MD [facility] Pul[facility]/Critical Care Pager - [phone] 12/01/2022, 2:09 PM

## 2022-12-06 ENCOUNTER — Encounter (HOSPITAL_BASED_OUTPATIENT_CLINIC_OR_DEPARTMENT_OTHER): Payer: Self-pay | Admitting: Pulmonary Disease

## 2022-12-07 ENCOUNTER — Telehealth: Payer: Self-pay | Admitting: Pulmonary Disease

## 2022-12-07 NOTE — Telephone Encounter (Signed)
Patient would like the nurse to call regarding her current medication.  She said she is still experiencing SOB, chest pain and believes she has a respiratory infection.  She needs advice and wants to know if the medication should be changed.  Please call to discuss further at [phone]

## 2022-12-08 NOTE — Telephone Encounter (Signed)
Called and spoke with patient. She stated that she was currently in the ED due to the chest and rib pain. I advised her to remain at the ED since the pain had increased over the past few days. She verbalized understanding.   While on the phone, she wanted to know if Dr. Craige Cotta would be willing to extend her excuse from work letter. The current letter ended today 12/08/22. I advised her to ask the ED for a letter. She stated that she would but she wanted to know if Dr. Craige Cotta would be willing to write one in case she is not able to get one from the ED.   Dr. Craige Cotta, can you please advise? Thanks!

## 2022-12-12 ENCOUNTER — Encounter: Payer: Self-pay | Admitting: Pulmonary Disease

## 2022-12-12 MED ORDER — MONTELUKAST SODIUM 10 MG PO TABS: 10.0000 mg | ORAL_TABLET | Freq: Every day | ORAL | 5 refills | Status: DC

## 2022-12-12 NOTE — Telephone Encounter (Signed)
I have printed a letter for her to remain out of work until 12/20/2022.  Signed letter is at [facility].

## 2022-12-12 NOTE — Telephone Encounter (Signed)
Left message for patient to call back  

## 2022-12-12 NOTE — Telephone Encounter (Signed)
Okay to write letter to extend her time out of work.

## 2022-12-13 ENCOUNTER — Other Ambulatory Visit (HOSPITAL_BASED_OUTPATIENT_CLINIC_OR_DEPARTMENT_OTHER): Payer: Self-pay

## 2022-12-13 ENCOUNTER — Telehealth (HOSPITAL_BASED_OUTPATIENT_CLINIC_OR_DEPARTMENT_OTHER): Payer: Self-pay | Admitting: Pulmonary Disease

## 2022-12-13 ENCOUNTER — Encounter: Payer: Self-pay | Admitting: *Deleted

## 2022-12-13 DIAGNOSIS — Z8709 Personal history of other diseases of the respiratory system: Secondary | ICD-10-CM

## 2022-12-13 DIAGNOSIS — R0600 Dyspnea, unspecified: Secondary | ICD-10-CM

## 2022-12-13 MED ORDER — ALBUTEROL SULFATE HFA 108 (90 BASE) MCG/ACT IN AERS
2.0000 | INHALATION_SPRAY | Freq: Four times a day (QID) | RESPIRATORY_TRACT | 2 refills | Status: AC | PRN
Start: 2022-12-13 — End: ?

## 2022-12-13 NOTE — Telephone Encounter (Signed)
ATC patient to let her know she has a work note signed by Dr. Craige Cotta ready for pick up at [facility] [place] office.  LM to call back.

## 2022-12-13 NOTE — Telephone Encounter (Signed)
Albuterol sent to pharmacy as requested

## 2022-12-13 NOTE — Telephone Encounter (Signed)
Mychart message sent to patient to let her know that the letter is ready for pick up.

## 2022-12-13 NOTE — Telephone Encounter (Signed)
Patient called back- informed refill was sent.

## 2022-12-13 NOTE — Telephone Encounter (Signed)
While patient was here picking up her letter from Dr. Craige Cotta patient requested refill for her albuterol inhaler. Please advise and call patient if needed.  Pharm: [facility] [address] P: [phone]

## 2022-12-19 DIAGNOSIS — E559 Vitamin D deficiency, unspecified: Secondary | ICD-10-CM | POA: Diagnosis not present

## 2022-12-19 DIAGNOSIS — E7800 Pure hypercholesterolemia, unspecified: Secondary | ICD-10-CM | POA: Diagnosis not present

## 2022-12-19 DIAGNOSIS — R635 Abnormal weight gain: Secondary | ICD-10-CM | POA: Diagnosis not present

## 2022-12-19 DIAGNOSIS — R0602 Shortness of breath: Secondary | ICD-10-CM | POA: Diagnosis not present

## 2022-12-19 DIAGNOSIS — F32A Depression, unspecified: Secondary | ICD-10-CM | POA: Diagnosis not present

## 2022-12-19 DIAGNOSIS — F419 Anxiety disorder, unspecified: Secondary | ICD-10-CM | POA: Diagnosis not present

## 2022-12-19 DIAGNOSIS — Z6838 Body mass index (BMI) 38.0-38.9, adult: Secondary | ICD-10-CM | POA: Diagnosis not present

## 2022-12-19 DIAGNOSIS — Z3200 Encounter for pregnancy test, result unknown: Secondary | ICD-10-CM | POA: Diagnosis not present

## 2022-12-19 DIAGNOSIS — R5381 Other malaise: Secondary | ICD-10-CM | POA: Diagnosis not present

## 2022-12-19 DIAGNOSIS — R5383 Other fatigue: Secondary | ICD-10-CM | POA: Diagnosis not present

## 2022-12-19 DIAGNOSIS — E6609 Other obesity due to excess calories: Secondary | ICD-10-CM | POA: Diagnosis not present

## 2023-01-07 DIAGNOSIS — R0600 Dyspnea, unspecified: Secondary | ICD-10-CM | POA: Diagnosis not present

## 2023-01-17 DIAGNOSIS — Z3200 Encounter for pregnancy test, result unknown: Secondary | ICD-10-CM | POA: Diagnosis not present

## 2023-01-17 DIAGNOSIS — R5381 Other malaise: Secondary | ICD-10-CM | POA: Diagnosis not present

## 2023-01-17 DIAGNOSIS — R635 Abnormal weight gain: Secondary | ICD-10-CM | POA: Diagnosis not present

## 2023-01-17 DIAGNOSIS — E559 Vitamin D deficiency, unspecified: Secondary | ICD-10-CM | POA: Diagnosis not present

## 2023-01-17 DIAGNOSIS — F419 Anxiety disorder, unspecified: Secondary | ICD-10-CM | POA: Diagnosis not present

## 2023-01-17 DIAGNOSIS — E7800 Pure hypercholesterolemia, unspecified: Secondary | ICD-10-CM | POA: Diagnosis not present

## 2023-01-17 DIAGNOSIS — F32A Depression, unspecified: Secondary | ICD-10-CM | POA: Diagnosis not present

## 2023-01-17 DIAGNOSIS — R5383 Other fatigue: Secondary | ICD-10-CM | POA: Diagnosis not present

## 2023-01-17 DIAGNOSIS — R0602 Shortness of breath: Secondary | ICD-10-CM | POA: Diagnosis not present

## 2023-01-22 ENCOUNTER — Telehealth: Payer: Self-pay | Admitting: Family

## 2023-01-22 ENCOUNTER — Ambulatory Visit: Payer: Self-pay | Admitting: *Deleted

## 2023-01-22 NOTE — Telephone Encounter (Signed)
Called pt and left vm to call office back to schedule appt requested via MyChart.

## 2023-01-22 NOTE — Telephone Encounter (Signed)
Reason for Disposition  [1] Follow-up call to recent contact AND [2] information only call, no triage required  Answer Assessment - Initial Assessment Questions 1. REASON FOR CALL or QUESTION: "What is your reason for calling today?" or "How can I best help you?" or "What question do you have that I can help answer?"     Pt. Returned call from Primary Care at [facility] where [place] left a voicemail to call back about an appt.    It was recommended she go to the urgent care.   Next available appt was Sept 23.   Pt was agreeable to going on to the urgent care.   She didn't want to wait that long.  Protocols used: Information Only Call - No Triage-A-AH

## 2023-01-22 NOTE — Telephone Encounter (Signed)
  Chief Complaint: Pt returned call to the office.  Martha Hall had left a voicemail for her to call back.  I relayed the message for her to go to the urgent care.   She didn't want to wait for the next available appt on 01/29/2023.   Symptoms: Issues with her menstrual cycle Frequency: N/A Pertinent Negatives: Patient denies N/A Disposition: [] ED /[x] Urgent Care (no appt availability in office) / [] Appointment(In office/virtual)/ []  [place] [facility]/ [] Home Care/ [] Refused Recommended Disposition /[] [place] [facility]/ []  Follow-up with PCP Additional Notes: Pt was agreeable to going to the urgent care.

## 2023-01-25 ENCOUNTER — Ambulatory Visit (HOSPITAL_BASED_OUTPATIENT_CLINIC_OR_DEPARTMENT_OTHER): Payer: Medicaid Other | Admitting: Pulmonary Disease

## 2023-01-25 ENCOUNTER — Encounter (HOSPITAL_BASED_OUTPATIENT_CLINIC_OR_DEPARTMENT_OTHER): Payer: Self-pay | Admitting: Pulmonary Disease

## 2023-01-25 ENCOUNTER — Ambulatory Visit (INDEPENDENT_AMBULATORY_CARE_PROVIDER_SITE_OTHER): Payer: BLUE CROSS/BLUE SHIELD

## 2023-01-25 VITALS — BP 106/78 | HR 92 | Ht 62.0 in | Wt 192.0 lb

## 2023-01-25 DIAGNOSIS — R0683 Snoring: Secondary | ICD-10-CM

## 2023-01-25 DIAGNOSIS — R053 Chronic cough: Secondary | ICD-10-CM | POA: Diagnosis not present

## 2023-01-25 MED ORDER — CEFUROXIME AXETIL 500 MG PO TABS: 500.0000 mg | ORAL_TABLET | Freq: Two times a day (BID) | ORAL | 0 refills | Status: DC

## 2023-01-25 MED ORDER — SPIRIVA RESPIMAT 1.25 MCG/ACT IN AERS: 2.0000 | INHALATION_SPRAY | Freq: Every day | RESPIRATORY_TRACT | 5 refills | Status: DC

## 2023-01-25 MED ORDER — PREDNISONE 10 MG PO TABS: ORAL_TABLET | ORAL | 0 refills | Status: AC

## 2023-01-25 NOTE — Progress Notes (Signed)
[facility]  Name: Martha Hall MRN: [id number] DOB: [birth year]  Chief Complaint  Patient presents with   Cough    Summary: 27 yr old female previous followed by Dr. Kendrick Fries for asthma.  Subjective: She started having more cough about 1 week ago. Feels chest congestion.  Has coughed up blood in her phlegm.  Feels tight in her chest since weather changed.  Has been using her inhalers.  No exposures.  Not having fever.  She snores and stops breathing at night.  Wakes up feeling short of breath.  Tired during the day.  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  Vital signs: BP 106/78   Pulse 92   Ht 5\' 2"  (1.575 m)   Wt 192 lb (87.1 kg)   SpO2 100%   BMI 35.12 kg/m   Physical exam:  General - alert ENT - no sinus tenderness, no stridor, +2 tonsils Cardiac - regular rate/rhythm, no murmur Chest - scattered rhonchi Extremities - no cyanosis, clubbing, or edema Skin - no rashes Psych - normal mood and behavior Lymphatics - no lymphadenopathy  Studies: PFT 05/23/22 >> FEV1 2.73 (87%), FEV1% 88, TLC 3.98 (82%), DLCO 105% IgE 12/01/22 >> 28 CBC 12/01/22 >> Eos 0  Assessment/plan:  Chronic cough with acute asthmatic bronchitis and history of severe, persistent allergic asthma. - will give her another course of prednisone and cefuroxime - chest xray today - add spiriva 1.25 mcg two puffs daily - continue advair 115 two puffs bid, singulair 10 mg nightly - prn albuterol - she has a nebulizer machine - might need to consider biologic like tezspire  Snoring. - associated with sleep disruption, apnea, and daytime  sleepiness - her BMI is > 35 - she has history of anxiety and depression - will arrange for a home sleep study to determine if she has obstructive sleep apnea  Allergic rhinitis. - seen previously by Dr. Cheron Schaumann with ENT - prn OTC flonase, antihistamine  Patient Instructions  Will schedule a home sleep study  Chest xray today  Prednisone 10 mg pill >> 3 pills daily for 2 days, 2 pills daily for 2 days, 1 pill daily for 2 days  Cefuroxime 500 mg pill twice daily for 7 days  Spiriva two puffs daily  Follow up in 8 weeks  Allergies as of 01/25/2023       Reactions   Metoclopramide Hives, Itching        Medication List        Accurate as of January 25, 2023  9:57 AM. If you have any questions, ask your nurse or doctor.          AeroChamber MV inhaler Use as instructed   albuterol (2.5 MG/3ML) 0.083% nebulizer solution Commonly known as: PROVENTIL Take 3 mLs (2.5 mg total) by nebulization every 6 (six) hours as needed for wheezing or shortness of breath.   albuterol 108 (90 Base) MCG/ACT inhaler Commonly known as: VENTOLIN HFA Inhale 2 puffs into the lungs every 6 (six) hours as needed for wheezing or shortness of breath.   cefUROXime 500 MG tablet Commonly known as: CEFTIN Take 1 tablet (500 mg total) by mouth 2 (two) times daily with a meal. Started by: Coralyn Helling   fluticasone-salmeterol 115-21 MCG/ACT inhaler Commonly known as:  Advair HFA Inhale 2 puffs into the lungs 2 (two) times daily.   gabapentin 300 MG capsule Commonly known as: Neurontin Take 1 capsule (300 mg total) by mouth 3 (three) times daily.   glycopyrrolate 1 MG tablet Commonly known as: ROBINUL Take 1 mg by mouth 3 (three) times daily.   montelukast 10 MG tablet Commonly known as: SINGULAIR Take 1 tablet (10 mg total) by mouth at bedtime.   naproxen sodium 550 MG tablet Commonly known as: Anaprox DS Take 1 tablet (550 mg total) by mouth 2 (two) times daily with a  meal.   phentermine 15 MG capsule Take 15 mg by mouth 2 (two) times daily.   prazosin 1 MG capsule Commonly known as: MINIPRESS Take 1 capsule (1 mg total) by mouth at bedtime.   predniSONE 10 MG tablet Commonly known as: DELTASONE Take 3 tablets (30 mg total) by mouth daily with breakfast for 2 days, THEN 2 tablets (20 mg total) daily with breakfast for 2 days, THEN 1 tablet (10 mg total) daily with breakfast for 2 days. Start taking on: January 25, 2023 Started by: Coralyn Helling   promethazine-dextromethorphan 6.25-15 MG/5ML syrup Commonly known as: PROMETHAZINE-DM Take 5 mLs by mouth 4 (four) times daily as needed.   QUEtiapine 50 MG tablet Commonly known as: SEROQUEL Take 1 tablet (50 mg total) by mouth daily.   sertraline 50 MG tablet Commonly known as: ZOLOFT Take 1 tablet (50 mg total) by mouth daily.   Spiriva Respimat 1.25 MCG/ACT Aers Generic drug: Tiotropium Bromide Monohydrate Inhale 2 puffs into the lungs daily at 2 am. Started by: Coralyn Helling   topiramate 25 MG tablet Commonly known as: TOPAMAX Take 25 mg by mouth daily.        Time spent: 37 minutes  Signature: Coralyn Helling, MD [facility] Pulmonary[facility] Pager - [phone] 01/25/2023, 9:57 AM

## 2023-01-25 NOTE — Patient Instructions (Signed)
Will schedule a home sleep study  Chest xray today  Prednisone 10 mg pill >> 3 pills daily for 2 days, 2 pills daily for 2 days, 1 pill daily for 2 days  Cefuroxime 500 mg pill twice daily for 7 days  Spiriva two puffs daily  Follow up in 8 weeks

## 2023-01-30 ENCOUNTER — Ambulatory Visit: Payer: BLUE CROSS/BLUE SHIELD

## 2023-01-30 DIAGNOSIS — R0683 Snoring: Secondary | ICD-10-CM | POA: Diagnosis not present

## 2023-01-31 ENCOUNTER — Telehealth: Payer: Medicaid Other | Admitting: Physician Assistant

## 2023-01-31 DIAGNOSIS — R040 Epistaxis: Secondary | ICD-10-CM

## 2023-01-31 NOTE — Progress Notes (Signed)
Because you need an exam to determine location of the recurring nose bleeds and to make sure proper treatments are given, I feel your condition warrants further evaluation and I recommend that you be seen in a face to face visit.   NOTE: There will be NO CHARGE for this eVisit   If you are having a true medical emergency please call 911.      For an urgent face to face visit,  has eight urgent care centers for your convenience:   NEW!! [facility] Health Urgent Care Center at [facility] Get Driving Directions [phone] [address] [place], [address]    [facility] Health Urgent Care Center at [facility] Get Driving Directions [phone] [address] [place], [place] [address]   [facility] Urgent Care Center [facility]) Get Driving Directions [phone] [address] [place], [place] [address]  [facility] Health Urgent Care Center [facility]) Get Driving Directions [phone] [address] [place],  [place]  [address]  [facility] Health Urgent Care Center [facility] - at [employer]  [phone] [address] [place],  [place] [address]   [facility] Health Urgent Care at [facility] Get Driving Directions [phone] [address] [place], [place] [address]   [facility] Health Urgent Care at [facility] Get Driving Directions  [phone] [address] [place], [place] [address]   [facility] Health Urgent Care at [facility] Directions [phone] [address], [address] [place], [place] [address]  Your MyChart E-visit questionnaire answers were reviewed by a board certified advanced clinical practitioner to complete your personal care plan based on your specific symptoms.  Thank you for using e-Visits.

## 2023-02-02 DIAGNOSIS — R0683 Snoring: Secondary | ICD-10-CM | POA: Diagnosis not present

## 2023-02-06 ENCOUNTER — Other Ambulatory Visit: Payer: Self-pay

## 2023-02-06 ENCOUNTER — Ambulatory Visit
Admission: EM | Admit: 2023-02-06 | Discharge: 2023-02-06 | Disposition: A | Discharge status: Home / Self Care | Payer: Medicaid Other | Attending: Physician Assistant | Admitting: Physician Assistant

## 2023-02-06 DIAGNOSIS — N926 Irregular menstruation, unspecified: Secondary | ICD-10-CM | POA: Diagnosis not present

## 2023-02-06 DIAGNOSIS — R0600 Dyspnea, unspecified: Secondary | ICD-10-CM | POA: Diagnosis not present

## 2023-02-06 LAB — POCT URINALYSIS DIP (MANUAL ENTRY)
Blood, UA: NEGATIVE
Glucose, UA: NEGATIVE mg/dL
Leukocytes, UA: NEGATIVE
Nitrite, UA: NEGATIVE
Spec Grav, UA: >=1.03 — AB (ref 1.010–1.025)
Urobilinogen, UA: 0.2 U/dL
pH, UA: 6 (ref 5.0–8.0)

## 2023-02-06 LAB — POCT URINE PREGNANCY: Preg Test, Ur: NEGATIVE

## 2023-02-06 LAB — POCT FASTING CBG KUC MANUAL ENTRY: POCT Glucose (KUC): 65 mg/dL — AB (ref 70–99)

## 2023-02-06 NOTE — ED Triage Notes (Addendum)
Patient reports Martha Hall she had 2 periods and September 3 days of menstrual cycle and since she have been experiencing headache, soreness/tenderness in breast and nausea. States she has the urge to vomit and is unable to. Patient also reports multiple nose bleeds last week. Patient also want STI testing.

## 2023-02-06 NOTE — Discharge Instructions (Signed)
  Please follow up with PCP as soon as possible.   Report to ED with any worsening symptoms.

## 2023-02-06 NOTE — ED Provider Notes (Signed)
[name]    CSN: [id number] Arrival date & time: 02/06/23  8657      History   Chief Complaint No chief complaint on file.   HPI Miah Gonder is a 27 y.o. female.   Patient here today for evaluation of abnormal menstrual bleeding.  She reports that in August she had 2 periods and then only had a 3-day cycle in September.  She is unsure why this is occurring but notes that she has had a headache, soreness and tenderness in her breast as well as nausea.  She also notes that she has had multiple nosebleeds in the last week.  She states nosebleeds will last about 5 minutes.  She notes she is on appetite suppressant has been so since March of last year.  She did call PCP but was unable to get appointment.  She would like STD screening.  She states she has had her thyroid checked recently and it was normal.  The history is provided by the patient.    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 Per consultation with Dr. Earlene Plater, no further work-up necessary. Per MFM: " The patient was reassured that the anti-Lewis antibodies are usually of the IgM subtype and therefore we will not cross the placenta and cause fetal anemia.  The Lewis antibodies should therefore not affect the management of her current pregnancy."   PTSD (post-traumatic stress disorder)    Vaginal trichomoniasis 05/09/2019   04/30/2019 on Pap [] tx [] TOC- neg   Vitamin D deficiency     Patient Active Problem List   Diagnosis Date Noted   Acanthosis nigricans 07/05/2022   Hyperhidrosis 07/04/2022   Moderate episode of recurrent major depressive disorder (HCC) 12/15/2021   PTSD (post-traumatic stress disorder)  12/15/2021   GAD (generalized anxiety disorder) 12/15/2021   Prediabetes 06/07/2021   Hospital discharge follow-up 02/04/2021   Choledocholithiasis    Cholelithiasis 01/25/2021   History of hydrocephalus 06/11/2019   History of blood clot in brain 05/28/2019   Alpha thalassemia silent carrier 05/12/2019   Morbid obesity (HCC) 04/30/2019    Past Surgical History:  Procedure Laterality Date   BRAIN SURGERY  [birth year]08[birth year]1998   stint placed when born   CHOLECYSTECTOMY N/A 01/25/2021   Procedure: LAPAROSCOPIC CHOLECYSTECTOMY WITH ATTEMPTED CHOLANGIOGRAM;  Surgeon: Abigail Miyamoto, MD;  Location: [facility] OR;  Service: General;  Laterality: N/A;   ERCP N/A 01/29/2021   Procedure: ENDOSCOPIC RETROGRADE CHOLANGIOPANCREATOGRAPHY (ERCP);  Surgeon: Iva Boop, MD;  [facility] ENDOSCOPY;  Service: Endoscopy;  Laterality: N/A;   IR RADIOLOGIST EVAL & MGMT  03/23/2021   REMOVAL OF STONES  01/29/2021   Procedure: REMOVAL OF STONES;  Surgeon: Iva Boop, MD;  Location: [facility] ENDOSCOPY;  Service: Endoscopy;;   SPHINCTEROTOMY  01/29/2021   Procedure: Dennison Mascot;  Surgeon: Iva Boop, MD;  Location: [facility] ENDOSCOPY;  Service: Endoscopy;;    OB History     Gravida  1   Para  1   Term  1   Preterm      AB  0   Living  1      SAB  0   IAB  Ectopic      Multiple  0   Live Births  1            Home Medications    Prior to Admission medications   Medication Sig Start Date End Date Taking? Authorizing Provider  albuterol (PROVENTIL) (2.5 MG/3ML) 0.083% nebulizer solution Take 3 mLs (2.5 mg total) by nebulization every 6 (six) hours as needed for wheezing or shortness of breath. 04/04/22   Rema Fendt, NP  albuterol (VENTOLIN HFA) 108 (90 Base) MCG/ACT inhaler Inhale 2 puffs into the lungs every 6 (six) hours as needed for wheezing or shortness of breath. 12/13/22   Oretha Milch, MD  cefUROXime (CEFTIN) 500 MG tablet Take 1 tablet (500 mg total) by mouth 2 (two) times  daily with a meal. 01/25/23   Coralyn Helling, MD  fluticasone-salmeterol (ADVAIR HFA) 115-21 MCG/ACT inhaler Inhale 2 puffs into the lungs 2 (two) times daily. 05/23/22   Lupita Leash, MD  gabapentin (NEURONTIN) 300 MG capsule Take 1 capsule (300 mg total) by mouth 3 (three) times daily. 03/09/22   Shanna Cisco, NP  glycopyrrolate (ROBINUL) 1 MG tablet Take 1 mg by mouth 3 (three) times daily. 07/12/22   [provider]  montelukast (SINGULAIR) 10 MG tablet Take 1 tablet (10 mg total) by mouth at bedtime. 12/12/22   Coralyn Helling, MD  naproxen sodium (ANAPROX DS) 550 MG tablet Take 1 tablet (550 mg total) by mouth 2 (two) times daily with a meal. 08/10/22   Rema Fendt, NP  phentermine 15 MG capsule Take 15 mg by mouth 2 (two) times daily. 08/01/22   [provider]  prazosin (MINIPRESS) 1 MG capsule Take 1 capsule (1 mg total) by mouth at bedtime. 03/09/22   Shanna Cisco, NP  promethazine-dextromethorphan (PROMETHAZINE-DM) 6.25-15 MG/5ML syrup Take 5 mLs by mouth 4 (four) times daily as needed. 10/02/22   [name], PA-C  QUEtiapine (SEROQUEL) 50 MG tablet Take 1 tablet (50 mg total) by mouth daily. 03/09/22   Shanna Cisco, NP  sertraline (ZOLOFT) 50 MG tablet Take 1 tablet (50 mg total) by mouth daily. 03/09/22   Shanna Cisco, NP  Spacer/Aero-Holding Chambers (AEROCHAMBER MV) inhaler Use as instructed 04/12/22   Lupita Leash, MD  Tiotropium Bromide Monohydrate (SPIRIVA RESPIMAT) 1.25 MCG/ACT AERS Inhale 2 puffs into the lungs daily at 2 am. 01/25/23   Coralyn Helling, MD  topiramate (TOPAMAX) 25 MG tablet Take 25 mg by mouth daily. 07/30/22   [provider]  diphenhydrAMINE (BENADRYL) 25 MG tablet Take 12.5 mg by mouth every 6 (six) hours as needed for allergies. Patient not taking: Reported on 12/30/2019  06/21/20  [provider]    Family History Family History  Problem Relation Age of Onset   Healthy Mother    Healthy  Father     Social History Social History   Tobacco Use   Smoking status: Never    Passive exposure: Never   Smokeless tobacco: Never  Vaping Use   Vaping status: Never Used  Substance Use Topics   Alcohol use: Never   Drug use: Never     Allergies   Metoclopramide   Review of Systems Review of Systems  Constitutional:  Negative for chills and fever.  Eyes:  Negative for discharge and redness.  Respiratory:  Negative for shortness of breath.   Gastrointestinal:  Positive for nausea. Negative for abdominal pain, diarrhea and vomiting.  Genitourinary:  Positive for menstrual  problem.     Physical Exam Triage Vital Signs ED Triage Vitals  Encounter Vitals Group     BP 02/06/23 0919 112/74     Systolic BP Percentile --      Diastolic BP Percentile --      Pulse Rate 02/06/23 0919 66     Resp --      Temp 02/06/23 0919 98 F (36.7 C)     Temp Source 02/06/23 0919 Oral     SpO2 02/06/23 0919 100 %     Weight 02/06/23 0917 184 lb (83.5 kg)     Height 02/06/23 0917 5' (1.524 m)     Head Circumference --      Peak Flow --      Pain Score 02/06/23 0917 5     Pain Loc --      Pain Education --      Exclude from Growth Chart --    No data found.  Updated Vital Signs BP 112/74 (BP Location: Left Arm)   Pulse 66   Temp 98 F (36.7 C) (Oral)   Ht 5' (1.524 m)   Wt 184 lb (83.5 kg)   LMP 01/29/2023   SpO2 100%   BMI 35.94 kg/m    Physical Exam Vitals and nursing note reviewed.  Constitutional:      General: She is not in acute distress.    Appearance: Normal appearance. She is not ill-appearing.  HENT:     Head: Normocephalic and atraumatic.     Nose: Nose normal. No congestion or rhinorrhea.  Eyes:     Conjunctiva/sclera: Conjunctivae normal.  Cardiovascular:     Rate and Rhythm: Normal rate and regular rhythm.  Pulmonary:     Effort: Pulmonary effort is normal. No respiratory distress.     Breath sounds: Normal breath sounds. No wheezing, rhonchi or  rales.  Neurological:     Mental Status: She is alert.  Psychiatric:        Mood and Affect: Mood normal.        Behavior: Behavior normal.        Thought Content: Thought content normal.      UC Treatments / Results  Labs (all labs ordered are listed, but only abnormal results are displayed) Labs Reviewed  POCT URINALYSIS DIP (MANUAL ENTRY) - Abnormal; Notable for the following components:      Result Value   Clarity, UA hazy (*)    Bilirubin, UA small (*)    Ketones, POC UA large (80) (*)    Spec Grav, UA >=1.030 (*)    Protein Ur, POC trace (*)    All other components within normal limits  POCT FASTING CBG [facility] MANUAL ENTRY - Abnormal; Notable for the following components:   POCT Glucose ([facility]) 65 (*)    All other components within normal limits  POCT URINE PREGNANCY - Normal  CERVICOVAGINAL ANCILLARY ONLY    EKG   Radiology No results found.  Procedures Procedures (including critical care time)  Medications Ordered in UC Medications - No data to display  Initial Impression / Assessment and Plan / UC Course  I have reviewed the triage vital signs and the nursing notes.  Pertinent labs & imaging results that were available during my care of the patient were reviewed by me and considered in my medical decision making (see chart for details).    Discussed labs with patient.  Suspect appetite suppressant is likely the cause of ketones noted in urine.  Discussed  option of emergency room visit today for further evaluation versus follow-up with PCP and possible referral to GYN.  Patient prefers outpatient option at this time.  Recommended further evaluation emergency room with any worsening symptoms.  Patient is agreeable to same.  Final Clinical Impressions(s) / UC Diagnoses   Final diagnoses:  Abnormal menstrual periods     Discharge Instructions       Please follow up with PCP as soon as possible.   Report to ED with any worsening symptoms.    ED  Prescriptions   None    PDMP not reviewed this encounter.   Tomi Bamberger, PA-C 02/06/23 1001

## 2023-02-16 DIAGNOSIS — E6609 Other obesity due to excess calories: Secondary | ICD-10-CM | POA: Diagnosis not present

## 2023-02-16 DIAGNOSIS — Z3200 Encounter for pregnancy test, result unknown: Secondary | ICD-10-CM | POA: Diagnosis not present

## 2023-02-16 DIAGNOSIS — Z6835 Body mass index (BMI) 35.0-35.9, adult: Secondary | ICD-10-CM | POA: Diagnosis not present

## 2023-02-16 DIAGNOSIS — F419 Anxiety disorder, unspecified: Secondary | ICD-10-CM | POA: Diagnosis not present

## 2023-02-16 DIAGNOSIS — R5381 Other malaise: Secondary | ICD-10-CM | POA: Diagnosis not present

## 2023-02-16 DIAGNOSIS — F32A Depression, unspecified: Secondary | ICD-10-CM | POA: Diagnosis not present

## 2023-02-16 DIAGNOSIS — R5383 Other fatigue: Secondary | ICD-10-CM | POA: Diagnosis not present

## 2023-02-16 DIAGNOSIS — E559 Vitamin D deficiency, unspecified: Secondary | ICD-10-CM | POA: Diagnosis not present

## 2023-02-18 ENCOUNTER — Telehealth: Payer: Medicaid Other | Admitting: Nurse Practitioner

## 2023-02-18 DIAGNOSIS — J309 Allergic rhinitis, unspecified: Secondary | ICD-10-CM

## 2023-02-18 MED ORDER — FLUTICASONE PROPIONATE 50 MCG/ACT NA SUSP
2.0000 | Freq: Every day | NASAL | 0 refills | Status: DC
Start: 2023-02-18 — End: 2023-06-01

## 2023-02-18 NOTE — Progress Notes (Signed)
I have spent 5 minutes in review of e-visit questionnaire, review and updating patient chart, medical decision making and response to patient.  ° °Jerrell Mangel W Secilia Apps, NP ° °  °

## 2023-02-18 NOTE — Progress Notes (Signed)
E visit for Allergic Rhinitis We are sorry that you are not feeling well.  Here is how we plan to help!  Based on what you have shared with me it looks like you have Allergic Rhinitis.  Rhinitis is when a reaction occurs that causes nasal congestion, runny nose, sneezing, and itching.  Most types of rhinitis are caused by an inflammation and are associated with symptoms in the eyes ears or throat. There are several types of rhinitis.  The most common are acute rhinitis, which is usually caused by a viral illness, allergic or seasonal rhinitis, and nonallergic or year-round rhinitis.  Nasal allergies occur certain times of the year.  Allergic rhinitis is caused when allergens in the air trigger the release of histamine in the body.  Histamine causes itching, swelling, and fluid to build up in the fragile linings of the nasal passages, sinuses and eyelids.  An itchy nose and clear discharge are common.   I also would recommend a nasal spray: Flonase 2 sprays into each nostril once daily  HOME CARE:  You can use an over-the-counter saline nasal spray as needed Avoid areas where there is heavy dust, mites, or molds Stay indoors on windy days during the pollen season Keep windows closed in home, at least in bedroom; use air conditioner. Use high-efficiency house air filter Keep windows closed in car, turn AC on re-circulate Avoid playing out with dog during pollen season  GET HELP RIGHT AWAY IF:  If your symptoms do not improve within 10 days You become short of breath You develop yellow or green discharge from your nose for over 3 days You have coughing fits  MAKE SURE YOU:  Understand these instructions Will watch your condition Will get help right away if you are not doing well or get worse  Thank you for choosing an e-visit. Your e-visit answers were reviewed by a board certified advanced clinical practitioner to complete your personal care plan. Depending upon the condition, your  plan could have included both over the counter or prescription medications. Please review your pharmacy choice. Be sure that the pharmacy you have chosen is open so that you can pick up your prescription now.  If there is a problem you may message your provider in MyChart to have the prescription routed to another pharmacy. Your safety is important to [place]. If you have drug allergies check your prescription carefully.  For the next 24 hours, you can use MyChart to ask questions about today's visit, request a non-urgent call back, or ask for a work or school excuse from your e-visit provider. You will get an email in the next two days asking about your experience. I hope that your e-visit has been valuable and will speed your recovery.

## 2023-03-08 ENCOUNTER — Telehealth: Payer: Self-pay | Admitting: Primary Care

## 2023-03-08 NOTE — Telephone Encounter (Signed)
Patient needs a refill on singular. Pharmacy [facility] on [address]  Patient also states that she is having pain in her lungs and thinks there might be inflammation. She uses a heating pad at night when she's in the most pain. She had to reschedule her November appointment for the next available with Lawerance Sabal which is January. Please call and advise.

## 2023-03-09 MED ORDER — SPIRIVA RESPIMAT 1.25 MCG/ACT IN AERS: 2.0000 | INHALATION_SPRAY | Freq: Every day | RESPIRATORY_TRACT | 5 refills | Status: DC

## 2023-03-09 MED ORDER — MONTELUKAST SODIUM 10 MG PO TABS: 10.0000 mg | ORAL_TABLET | Freq: Every day | ORAL | 2 refills | Status: DC

## 2023-03-09 NOTE — Telephone Encounter (Signed)
Spiriva sent to preferred pharmacy.   Lm for patient.

## 2023-03-09 NOTE — Telephone Encounter (Signed)
Signulair sent to preferred pharmacy.  Lm for patient.

## 2023-03-09 NOTE — Telephone Encounter (Signed)
Patient returned call. She verbalized that she did not need Singulair refilled, but that she was needing her Spiriva refilled. Confirmed pharmacy is [facility] on [address]. Please advise.

## 2023-03-09 NOTE — Telephone Encounter (Signed)
Tiotropium Bromide Monohydrate prescribed 03/09/2023.

## 2023-03-15 DIAGNOSIS — E6609 Other obesity due to excess calories: Secondary | ICD-10-CM | POA: Diagnosis not present

## 2023-03-15 DIAGNOSIS — R5383 Other fatigue: Secondary | ICD-10-CM | POA: Diagnosis not present

## 2023-03-15 DIAGNOSIS — R5381 Other malaise: Secondary | ICD-10-CM | POA: Diagnosis not present

## 2023-03-15 DIAGNOSIS — E559 Vitamin D deficiency, unspecified: Secondary | ICD-10-CM | POA: Diagnosis not present

## 2023-03-15 DIAGNOSIS — Z6835 Body mass index (BMI) 35.0-35.9, adult: Secondary | ICD-10-CM | POA: Diagnosis not present

## 2023-03-15 DIAGNOSIS — Z3200 Encounter for pregnancy test, result unknown: Secondary | ICD-10-CM | POA: Diagnosis not present

## 2023-03-15 DIAGNOSIS — F32A Depression, unspecified: Secondary | ICD-10-CM | POA: Diagnosis not present

## 2023-03-15 DIAGNOSIS — F419 Anxiety disorder, unspecified: Secondary | ICD-10-CM | POA: Diagnosis not present

## 2023-03-22 ENCOUNTER — Ambulatory Visit: Payer: Medicaid Other | Admitting: Primary Care

## 2023-05-11 ENCOUNTER — Ambulatory Visit: Payer: Medicaid Other | Admitting: Primary Care

## 2023-05-16 DIAGNOSIS — Z3200 Encounter for pregnancy test, result unknown: Secondary | ICD-10-CM | POA: Diagnosis not present

## 2023-05-16 DIAGNOSIS — E6609 Other obesity due to excess calories: Secondary | ICD-10-CM | POA: Diagnosis not present

## 2023-05-16 DIAGNOSIS — F419 Anxiety disorder, unspecified: Secondary | ICD-10-CM | POA: Diagnosis not present

## 2023-05-16 DIAGNOSIS — R635 Abnormal weight gain: Secondary | ICD-10-CM | POA: Diagnosis not present

## 2023-05-16 DIAGNOSIS — R5383 Other fatigue: Secondary | ICD-10-CM | POA: Diagnosis not present

## 2023-05-16 DIAGNOSIS — R5381 Other malaise: Secondary | ICD-10-CM | POA: Diagnosis not present

## 2023-05-16 DIAGNOSIS — E559 Vitamin D deficiency, unspecified: Secondary | ICD-10-CM | POA: Diagnosis not present

## 2023-05-16 DIAGNOSIS — F32A Depression, unspecified: Secondary | ICD-10-CM | POA: Diagnosis not present

## 2023-05-16 DIAGNOSIS — Z6833 Body mass index (BMI) 33.0-33.9, adult: Secondary | ICD-10-CM | POA: Diagnosis not present

## 2023-05-28 ENCOUNTER — Other Ambulatory Visit: Payer: Self-pay

## 2023-05-28 ENCOUNTER — Emergency Department (HOSPITAL_COMMUNITY)
Admission: EM | Admit: 2023-05-28 | Discharge: 2023-05-28 | Disposition: A | Discharge status: Home / Self Care | Payer: Medicaid Other | Attending: Emergency Medicine | Admitting: Emergency Medicine

## 2023-05-28 ENCOUNTER — Emergency Department (HOSPITAL_COMMUNITY): Payer: Medicaid Other

## 2023-05-28 DIAGNOSIS — S6992XA Unspecified injury of left wrist, hand and finger(s), initial encounter: Secondary | ICD-10-CM

## 2023-05-28 DIAGNOSIS — S61215A Laceration without foreign body of left ring finger without damage to nail, initial encounter: Secondary | ICD-10-CM | POA: Diagnosis not present

## 2023-05-28 DIAGNOSIS — X58XXXA Exposure to other specified factors, initial encounter: Secondary | ICD-10-CM | POA: Diagnosis not present

## 2023-05-28 DIAGNOSIS — Y990 Civilian activity done for income or pay: Secondary | ICD-10-CM | POA: Diagnosis not present

## 2023-05-28 DIAGNOSIS — M79645 Pain in left finger(s): Secondary | ICD-10-CM | POA: Diagnosis not present

## 2023-05-28 DIAGNOSIS — M79642 Pain in left hand: Secondary | ICD-10-CM | POA: Diagnosis not present

## 2023-05-28 NOTE — ED Provider Notes (Signed)
[place] EMERGENCY DEPARTMENT AT [facility] Provider Note   CSN: [id number] Arrival date & time: 05/28/23  1750     History  Chief Complaint  Patient presents with   Finger Injury   Hand Pain    Martha Hall is a 28 y.o. female who presents the emergency department after hand injury.  Patient states that she was at work when she sustained an injury to her left middle finger yesterday. There was does not bleeding after the incident, but she was able to bandage it and clean it.  She has had worsening pain in the finger radiating into her hand and her forearm since then.  Tried taking 400 mg ibuprofen and only had minimal relief.   Hand Pain       Home Medications Prior to Admission medications   Medication Sig Start Date End Date Taking? Authorizing Provider  albuterol (PROVENTIL) (2.5 MG/3ML) 0.083% nebulizer solution Take 3 mLs (2.5 mg total) by nebulization every 6 (six) hours as needed for wheezing or shortness of breath. 04/04/22   Rema Fendt, NP  albuterol (VENTOLIN HFA) 108 (90 Base) MCG/ACT inhaler Inhale 2 puffs into the lungs every 6 (six) hours as needed for wheezing or shortness of breath. 12/13/22   Oretha Milch, MD  cefUROXime (CEFTIN) 500 MG tablet Take 1 tablet (500 mg total) by mouth 2 (two) times daily with a meal. 01/25/23   Coralyn Helling, MD  fluticasone (FLONASE) 50 MCG/ACT nasal spray Place 2 sprays into both nostrils daily. 02/18/23   Claiborne Rigg, NP  fluticasone-salmeterol (ADVAIR HFA) [phone] MCG/ACT inhaler Inhale 2 puffs into the lungs 2 (two) times daily. 05/23/22   Lupita Leash, MD  gabapentin (NEURONTIN) 300 MG capsule Take 1 capsule (300 mg total) by mouth 3 (three) times daily. 03/09/22   Shanna Cisco, NP  glycopyrrolate (ROBINUL) 1 MG tablet Take 1 mg by mouth 3 (three) times daily. 07/12/22   [provider]  montelukast (SINGULAIR) 10 MG tablet Take 1 tablet (10 mg total) by mouth at bedtime. 03/09/23    Glenford Bayley, NP  naproxen sodium (ANAPROX DS) 550 MG tablet Take 1 tablet (550 mg total) by mouth 2 (two) times daily with a meal. 08/10/22   Rema Fendt, NP  phentermine 15 MG capsule Take 15 mg by mouth 2 (two) times daily. 08/01/22   [provider]  prazosin (MINIPRESS) 1 MG capsule Take 1 capsule (1 mg total) by mouth at bedtime. 03/09/22   Shanna Cisco, NP  promethazine-dextromethorphan (PROMETHAZINE-DM) 6.25-15 MG/5ML syrup Take 5 mLs by mouth 4 (four) times daily as needed. 10/02/22   [name], PA-C  QUEtiapine (SEROQUEL) 50 MG tablet Take 1 tablet (50 mg total) by mouth daily. 03/09/22   Shanna Cisco, NP  sertraline (ZOLOFT) 50 MG tablet Take 1 tablet (50 mg total) by mouth daily. 03/09/22   Shanna Cisco, NP  Spacer/Aero-Holding Chambers (AEROCHAMBER MV) inhaler Use as instructed 04/12/22   Lupita Leash, MD  Tiotropium Bromide Monohydrate (SPIRIVA RESPIMAT) 1.25 MCG/ACT AERS Inhale 2 puffs into the lungs daily. 03/09/23   Glenford Bayley, NP  topiramate (TOPAMAX) 25 MG tablet Take 25 mg by mouth daily. 07/30/22   [provider]  diphenhydrAMINE (BENADRYL) 25 MG tablet Take 12.5 mg by mouth every 6 (six) hours as needed for allergies. Patient not taking: Reported on 12/30/2019  06/21/20  [provider]      Allergies  Metoclopramide    Review of Systems   Review of Systems  Musculoskeletal:  Positive for arthralgias.       L long finger, hand  Skin:  Positive for wound.  All other systems reviewed and are negative.   Physical Exam Updated Vital Signs BP 122/82 (BP Location: Left Arm)   Pulse 73   Temp 97.6 F (36.4 C) (Oral)   Resp 14   Ht 5' (1.524 m)   Wt 78 kg   LMP 05/18/2023   SpO2 100%   BMI 33.59 kg/m  Physical Exam Vitals and nursing note reviewed.  Constitutional:      Appearance: Normal appearance.  HENT:     Head: Normocephalic and atraumatic.  Eyes:     Conjunctiva/sclera:  Conjunctivae normal.  Pulmonary:     Effort: Pulmonary effort is normal. No respiratory distress.  Musculoskeletal:     Comments: Pain with ranging the L long finger, specifically at the DIP and PIP. Normal sensation  Skin:    General: Skin is warm and dry.     Capillary Refill: Capillary refill takes less than 2 seconds.     Comments: Small laceration to the cuticle on the L long finger, no nail damage  Neurological:     Mental Status: She is alert.  Psychiatric:        Mood and Affect: Mood normal.        Behavior: Behavior normal.     ED Results / Procedures / Treatments   Labs (all labs ordered are listed, but only abnormal results are displayed) Labs Reviewed - No data to display  EKG None  Radiology DG Hand Complete Left Result Date: 05/28/2023 CLINICAL DATA:  Left hand injury, pain EXAM: LEFT HAND - COMPLETE 3+ VIEW COMPARISON:  None Available. FINDINGS: Bandaging or adhesive strep around the distal portion of the middle finger. No fracture or acute bony finding identified. IMPRESSION: 1. Bandaging or adhesive strep around the distal portion of the middle finger. No fracture or acute bony finding identified. Electronically Signed   By: Gaylyn Rong M.D.   On: 05/28/2023 18:30   DG Finger Middle Left Result Date: 05/28/2023 CLINICAL DATA:  Pain of the left middle finger EXAM: LEFT MIDDLE FINGER 2+V COMPARISON:  None Available. FINDINGS: Bandaging noted along the distal tip of the finger. No fracture or acute bony findings. No unexpected foreign body observed. No gas tracking in the soft tissues. IMPRESSION: 1. Bandaging along the distal tip of the finger. No fracture or acute bony findings. Electronically Signed   By: Gaylyn Rong M.D.   On: 05/28/2023 18:29    Procedures Procedures    Medications Ordered in ED Medications - No data to display  ED Course/ Medical Decision Making/ A&P                                 Medical Decision Making Amount and/or  Complexity of Data Reviewed Radiology: ordered.  This patient is a 28 y.o. female  who presents to the ED for concern of L middle finger injury.   Differential diagnoses prior to evaluation: The emergent differential diagnosis includes, but is not limited to,  fracture, dislocation, ligamentous injury, soft tissue injury, crush injury, septic joint, flexor tenosynovitis. This is not an exhaustive differential.   Past Medical History / Co-morbidities / Social History: Hydrocephalus, cholelithiasis, PTSD, anxiety  Physical Exam: Physical exam performed. The pertinent findings include: Normal vital  signs, no acute distress.  Laceration in the cuticle of the left long finger, fairly normal range of motion but pain with ranging the PIP and DIP.  No significant swelling or pain on the flexor surface.  Lab Tests/Imaging studies: I personally interpreted labs/imaging and the pertinent results include: X-ray of the left hand and the left middle finger unremarkable. I agree with the radiologist interpretation.  Disposition: After consideration of the diagnostic results and the patients response to treatment, I feel that emergency department workup does not suggest an emergent condition requiring admission or immediate intervention beyond what has been performed at this time. The plan is: Discharged home.  Offered buddy taping, but patient would prefer to splint, and this was given to her.  Recommended treatment with over-the-counter medications as needed for pain, ice as needed for swelling, and follow-up with Ortho if no better after a week.  Likely sprained the finger.  No significant trauma to the nail requiring further evaluation or removal.. The patient is safe for discharge and has been instructed to return immediately for worsening symptoms, change in symptoms or any other concerns.  Final Clinical Impression(s) / ED Diagnoses Final diagnoses:  Injury of left middle finger, initial encounter     Rx / DC Orders ED Discharge Orders     None      Portions of this report may have been transcribed using voice recognition software. Every effort was made to ensure accuracy; however, inadvertent computerized transcription errors may be present.    Jeanella Flattery 05/28/23 1900    Benjiman Core, MD 05/28/23 [phone]

## 2023-05-28 NOTE — ED Triage Notes (Signed)
Patient to ED by POV with left middle finger pain/injury. She states yesterday she injured her hand at work not sure what happened but pain is radiating into her wrist. She denies numbness or tingling but states yesterday it was warm. She is able to move middle finger.

## 2023-05-28 NOTE — ED Notes (Signed)
Splint applied to pts finger.

## 2023-05-28 NOTE — Discharge Instructions (Addendum)
You were seen in the ER for hand/finger injury.  Your x-ray did not show any broken or dislocated bones. We have given you splint.  Please use acetaminophen (Tylenol) or ibuprofen (Advil, Motrin) for pain.  You may use 800 mg ibuprofen every 6 hours or 1000 mg of acetaminophen every 6 hours.  You may choose to alternate between the two, this would be most effective. Do not exceed 4000 mg of acetaminophen within 24 hours.  Do not exceed 3200 mg ibuprofen within 24 hours.  I've attached the follow up information for the hand specialist if your pain persists despite using the splint, ice and taking ibuprofen/tylenol for a week.

## 2023-06-01 ENCOUNTER — Telehealth: Payer: Self-pay | Admitting: Primary Care

## 2023-06-01 ENCOUNTER — Telehealth (INDEPENDENT_AMBULATORY_CARE_PROVIDER_SITE_OTHER): Payer: Medicaid Other | Admitting: Primary Care

## 2023-06-01 ENCOUNTER — Other Ambulatory Visit: Payer: Self-pay | Admitting: Primary Care

## 2023-06-01 DIAGNOSIS — R0683 Snoring: Secondary | ICD-10-CM

## 2023-06-01 DIAGNOSIS — J309 Allergic rhinitis, unspecified: Secondary | ICD-10-CM

## 2023-06-01 DIAGNOSIS — Z8709 Personal history of other diseases of the respiratory system: Secondary | ICD-10-CM

## 2023-06-01 DIAGNOSIS — J4540 Moderate persistent asthma, uncomplicated: Secondary | ICD-10-CM

## 2023-06-01 MED ORDER — LEVOCETIRIZINE DIHYDROCHLORIDE 5 MG PO TABS: 5.0000 mg | ORAL_TABLET | Freq: Every evening | ORAL | 5 refills | Status: DC

## 2023-06-01 MED ORDER — AZELASTINE HCL 0.1 % NA SOLN: 1.0000 | Freq: Two times a day (BID) | NASAL | 5 refills | Status: DC

## 2023-06-01 MED ORDER — FLUTICASONE-SALMETEROL 230-21 MCG/ACT IN AERO: 2.0000 | INHALATION_SPRAY | Freq: Two times a day (BID) | RESPIRATORY_TRACT | 5 refills | Status: DC

## 2023-06-01 NOTE — Progress Notes (Signed)
Virtual Visit via Video Note  I connected with Martha Hall on 06/01/23 at  4:00 PM EST by a video enabled telemedicine application and verified that I am speaking with the correct person using two identifiers.  Location: Patient: Home Provider: Office   I discussed the limitations of evaluation and management by telemedicine and the availability of in person appointments. The patient expressed understanding and agreed to proceed.  History of Present Illness: 28 yr old female previously followed by Dr. Kendrick Fries and Dr. Craige Cotta for asthma.  Studies: PFT 05/23/22 >> FEV1 2.73 (87%), FEV1% 88, TLC 3.98 (82%), DLCO 105% IgE 12/01/22 >> 28 CBC 12/01/22 >> Eos 0  Previous [facility] pulmonary encounter: 01/25/23- Dr. Craige Cotta, Chronic cough  She started having more cough about 1 week ago. Feels chest congestion.  Has coughed up blood in her phlegm.  Feels tight in her chest since weather changed.  Has been using her inhalers.  No exposures.  Not having fever.  She snores and stops breathing at night.  Wakes up feeling short of breath.  Tired during the day.  Assessment/plan: Chronic cough with acute asthmatic bronchitis and history of severe, persistent allergic asthma. - will give her another course of prednisone and cefuroxime - chest xray today - add spiriva 1.25 mcg two puffs daily - continue advair 115 two puffs bid, singulair 10 mg nightly - prn albuterol - she has a nebulizer machine - might need to consider biologic like tezspire  Snoring. - associated with sleep disruption, apnea, and daytime sleepiness - her BMI is > 35 - she has history of anxiety and depression - will arrange for a home sleep study to determine if she has obstructive sleep apnea  Allergic rhinitis. - seen previously by Dr. Cheron Schaumann with ENT - prn OTC flonase, antihistamine   06/01/2023- Interim hx  Discussed the use of AI scribe software for clinical note transcription with the patient, who gave verbal  consent to proceed.  History of Present Illness   The patient, with a history of asthma and upper respiratory infections, presents with persistent shortness of breath despite current treatment regimen. She reports that even light activities such as walking trigger this symptom. She also describes a sensation of her lungs feeling "crushed" on some days. The patient has been using an albuterol rescue inhaler, which she requests a refill for, and an Advair HFA 115-43mcg twice daily. She also takes Montelukast along at bedtime along with Spiriva inhaler daily at 2 AM, a timing that was initially set due to her previous overnight work schedule.  She reports having completed a course of prednisone and cefdinir, for an asthma flare-up in September, prescribed by Dr. Craige Cotta. She has discontinued use of Flonase nasal spray due to nasal irritation. She is no longer taking Seroquel or Zoloft due to expiration and lack of contact with her psychiatrist.  The patient also reports a history of nocturnal snoring, but a recent sleep study showed no evidence of sleep apnea. She has a cyst in her nose, identified by an ear, nose, and throat specialist, which she believes may contribute to her frequent upper respiratory infections and sinusitis. She has tried various allergy medications, including Benadryl, Claritin and Zyrtec but reports that her symptoms worsen with these treatments.  The patient has also experienced weight gain associated with prednisone use, which she dislikes, but acknowledges that the medication improves her breathing. She has attempted lifestyle changes, including weight loss, but reports that her asthma symptoms have worsened. She has  also experienced an asthma attack at work, despite not being ill at the time.     Observations/Objective:  Appear alright, no overt respiratory symptoms   Assessment and Plan:     Asthma Persistent symptoms despite current regimen of Advair and Spiriva. Discussed  the potential need for biologic therapy if symptoms do not improve with increased Advair dose. She was treated for asthma flare up in September with course of prednisone and cefuroxime. Prednisone helps but causes weight gain. No current respiratory tract infection symptoms but dyspnea symptoms are worse  -Increase Advair HFA 230-60mcg twice daily  -Continue Spiriva 1.67mcg two puffs daily in the morning. -Continue Singulair at bedtime. -Refill Albuterol rescue inhaler for use as needed. -Check back in 6 weeks to assess response to increased Advair dose and potential need for biologic  Allergic Rhinitis Persistent symptoms despite previous use of Flonase, Benadryl, Claritin and Zyrtec -Discontinue Flonase due to nasal irritation -Prescribe Astelin nasal spray, use twice daily. -Prescribe Xyzal, use as needed  Sleep Apnea Negative sleep study in September. Some snoring noted but no significant apneic events. -No further action required at this time. If snoring becomes disruptive, consider referral to dentist for oral appliance.  Psychiatric Medications Patient self-discontinued Seroquel and Zoloft due to inability to get in touch with psychiatrist and medication expiration. -Recommend discussion with primary care provider for potential management of psychiatric medications.  Weight Management Patient on Phentermine. -Continue current regimen.  Migraine Managed with Topamax. -Continue current regimen.  Follow Up Instructions:   -Follow-up in-person visit in 6 weeks.     I discussed the assessment and treatment plan with the patient. The patient was provided an opportunity to ask questions and all were answered. The patient agreed with the plan and demonstrated an understanding of the instructions.   The patient was advised to call back or seek an in-person evaluation if the symptoms worsen or if the condition fails to improve as anticipated.  I provided 25 minutes of  non-face-to-face time during this encounter.   Glenford Bayley, NP

## 2023-06-01 NOTE — Telephone Encounter (Signed)
Needs FU with me in 6 weeks in person for asthma

## 2023-06-01 NOTE — Patient Instructions (Signed)
-  ASTHMA: Asthma is a condition where your airways narrow and swell, making it difficult to breathe. We are increasing your Advair dose to see if it helps with your symptoms. Continue using Spiriva in the morning, Singulair at bedtime, and your Albuterol rescue inhaler as needed. We will reassess your condition in 6 weeks.  -ALLERGIC RHINITIS: Allergic rhinitis is an allergic reaction that causes sneezing, congestion, and a runny nose. We are discontinuing Flonase and starting you on Astelin nasal spray twice daily and Xyzal as needed. Stop using Claritin and Zyrtec while on Xyzal.  -SLEEP APNEA: Sleep apnea is a condition where breathing repeatedly stops and starts during sleep. Your recent sleep study showed no evidence of sleep apnea, so no further action is needed unless your snoring becomes disruptive.  -PSYCHIATRIC MEDICATIONS: You have stopped taking Seroquel and Zoloft because you couldn't reach your psychiatrist. We recommend discussing this with your primary care provider to manage your psychiatric medications.  -WEIGHT MANAGEMENT: You are currently managing your weight with Phentermine. Continue with your current regimen.  -MIGRAINE: Migraines are severe headaches often accompanied by nausea and sensitivity to light. Continue managing your migraines with Topamax.  INSTRUCTIONS:  Please follow up with an in-person visit in 6 weeks to assess your response to the increased Advair dose and any other changes in your treatment plan.

## 2023-06-04 DIAGNOSIS — R0600 Dyspnea, unspecified: Secondary | ICD-10-CM

## 2023-06-04 DIAGNOSIS — Z8709 Personal history of other diseases of the respiratory system: Secondary | ICD-10-CM

## 2023-06-04 MED ORDER — ALBUTEROL SULFATE (2.5 MG/3ML) 0.083% IN NEBU
2.5000 mg | INHALATION_SOLUTION | Freq: Four times a day (QID) | RESPIRATORY_TRACT | Status: DC | PRN
Start: 2023-06-04 — End: 2024-03-21

## 2023-06-04 MED ORDER — ALBUTEROL SULFATE (2.5 MG/3ML) 0.083% IN NEBU
2.5000 mg | INHALATION_SOLUTION | Freq: Four times a day (QID) | RESPIRATORY_TRACT | Status: DC | PRN
Start: 2023-06-04 — End: 2023-06-04

## 2023-06-04 NOTE — Addendum Note (Signed)
Addended by: Gay Filler T on: 06/04/2023 04:34 PM   Modules accepted: Orders

## 2023-06-04 NOTE — Telephone Encounter (Signed)
I called and spoke with pt. I have her scheduled for 07/16/23 at 9:00 am with Waynetta Sandy, Np for asthma f/u. Pt also would like an albuterol neb refill. I informed pt I could refill this for her. Pt verbalized understanding for refill and appointment. NFN.

## 2023-06-05 MED ORDER — ALBUTEROL SULFATE (2.5 MG/3ML) 0.083% IN NEBU
2.5000 mg | INHALATION_SOLUTION | Freq: Four times a day (QID) | RESPIRATORY_TRACT | 1 refills | Status: AC | PRN
Start: 2023-06-05 — End: ?

## 2023-06-18 DIAGNOSIS — F32A Depression, unspecified: Secondary | ICD-10-CM | POA: Diagnosis not present

## 2023-06-18 DIAGNOSIS — F419 Anxiety disorder, unspecified: Secondary | ICD-10-CM | POA: Diagnosis not present

## 2023-06-18 DIAGNOSIS — Z6833 Body mass index (BMI) 33.0-33.9, adult: Secondary | ICD-10-CM | POA: Diagnosis not present

## 2023-06-18 DIAGNOSIS — Z3200 Encounter for pregnancy test, result unknown: Secondary | ICD-10-CM | POA: Diagnosis not present

## 2023-06-18 DIAGNOSIS — R5381 Other malaise: Secondary | ICD-10-CM | POA: Diagnosis not present

## 2023-06-18 DIAGNOSIS — R5383 Other fatigue: Secondary | ICD-10-CM | POA: Diagnosis not present

## 2023-06-18 DIAGNOSIS — E6609 Other obesity due to excess calories: Secondary | ICD-10-CM | POA: Diagnosis not present

## 2023-06-18 DIAGNOSIS — R635 Abnormal weight gain: Secondary | ICD-10-CM | POA: Diagnosis not present

## 2023-06-18 DIAGNOSIS — E559 Vitamin D deficiency, unspecified: Secondary | ICD-10-CM | POA: Diagnosis not present

## 2023-07-03 ENCOUNTER — Encounter: Payer: Medicaid Other | Admitting: Family

## 2023-07-16 ENCOUNTER — Ambulatory Visit: Payer: Medicaid Other | Admitting: Primary Care

## 2023-07-16 ENCOUNTER — Encounter: Payer: Self-pay | Admitting: Primary Care

## 2023-07-16 VITALS — BP 126/74 | HR 74 | Temp 97.4°F | Ht 60.0 in | Wt 183.8 lb

## 2023-07-16 DIAGNOSIS — J4540 Moderate persistent asthma, uncomplicated: Secondary | ICD-10-CM | POA: Diagnosis not present

## 2023-07-16 DIAGNOSIS — R0781 Pleurodynia: Secondary | ICD-10-CM | POA: Diagnosis not present

## 2023-07-16 LAB — CBC WITH DIFFERENTIAL/PLATELET
Basophils Absolute: 0 10*3/uL (ref 0.0–0.1)
Basophils Relative: 0.9 % (ref 0.0–3.0)
Eosinophils Absolute: 0 10*3/uL (ref 0.0–0.7)
Eosinophils Relative: 0.8 % (ref 0.0–5.0)
HCT: 37.2 % (ref 36.0–46.0)
Hemoglobin: 12 g/dL (ref 12.0–15.0)
Lymphocytes Relative: 30.9 % (ref 12.0–46.0)
Lymphs Abs: 1.6 10*3/uL (ref 0.7–4.0)
MCHC: 32.2 g/dL (ref 30.0–36.0)
MCV: 88.7 fl (ref 78.0–100.0)
Monocytes Absolute: 0.3 10*3/uL (ref 0.1–1.0)
Monocytes Relative: 6.1 % (ref 3.0–12.0)
Neutro Abs: 3.2 10*3/uL (ref 1.4–7.7)
Neutrophils Relative %: 61.3 % (ref 43.0–77.0)
Platelets: 315 10*3/uL (ref 150.0–400.0)
RBC: 4.19 Mil/uL (ref 3.87–5.11)
RDW: 14.3 % (ref 11.5–15.5)
WBC: 5.2 10*3/uL (ref 4.0–10.5)

## 2023-07-16 LAB — POCT EXHALED NITRIC OXIDE: FeNO level (ppb): 19

## 2023-07-16 LAB — C-REACTIVE PROTEIN: CRP: <1 mg/dL (ref 0.5–20.0)

## 2023-07-16 LAB — SEDIMENTATION RATE: Sed Rate: 13 mm/h (ref 0–20)

## 2023-07-16 MED ORDER — PREDNISONE 10 MG PO TABS
ORAL_TABLET | ORAL | 0 refills | Status: DC
Start: 2023-07-16 — End: 2023-09-20

## 2023-07-16 NOTE — Progress Notes (Signed)
 @Patient  ID: Martha Hall, female    DOB: 10/01/1995, 28 y.o.   MRN: [id number]  Chief Complaint  Patient presents with   Follow-up    Asthma flare up- 07-15-23 (Moderate) used her rescue inhaler and nebulizer    Referring provider: Rema Fendt, NP  HPI: 28 yr old female previously followed by Dr. Kendrick Fries and Dr. Craige Cotta for asthma.  Studies: PFT 05/23/22 >> FEV1 2.73 (87%), FEV1% 88, TLC 3.98 (82%), DLCO 105% IgE 12/01/22 >> 28 CBC 12/01/22 >> Eos 0  Previous [facility] pulmonary encounter: 06/01/2023 Discussed the use of AI scribe software for clinical note transcription with the patient, who gave verbal consent to proceed.  History of Present Illness   The patient, with a history of asthma and upper respiratory infections, presents with persistent shortness of breath despite current treatment regimen. She reports that even light activities such as walking trigger this symptom. She also describes a sensation of her lungs feeling "crushed" on some days. The patient has been using an albuterol rescue inhaler, which she requests a refill for, and an Advair HFA 115-48mcg twice daily. She also takes Montelukast along at bedtime along with Spiriva inhaler daily at 2 AM, a timing that was initially set due to her previous overnight work schedule.  She reports having completed a course of prednisone and cefdinir, for an asthma flare-up in September, prescribed by Dr. Craige Cotta. She has discontinued use of Flonase nasal spray due to nasal irritation. She is no longer taking Seroquel or Zoloft due to expiration and lack of contact with her psychiatrist.  The patient also reports a history of nocturnal snoring, but a recent sleep study showed no evidence of sleep apnea. She has a cyst in her nose, identified by an ear, nose, and throat specialist, which she believes may contribute to her frequent upper respiratory infections and sinusitis. She has tried various allergy medications, including Benadryl,  Claritin and Zyrtec but reports that her symptoms worsen with these treatments.  The patient has also experienced weight gain associated with prednisone use, which she dislikes, but acknowledges that the medication improves her breathing. She has attempted lifestyle changes, including weight loss, but reports that her asthma symptoms have worsened. She has also experienced an asthma attack at work, despite not being ill at the time.     Asthma Persistent symptoms despite current regimen of Advair and Spiriva. Discussed the potential need for biologic therapy if symptoms do not improve with increased Advair dose. She was treated for asthma flare up in September with course of prednisone and cefuroxime. Prednisone helps but causes weight gain. No current respiratory tract infection symptoms but dyspnea symptoms are worse  -Increase Advair HFA 230-38mcg twice daily  -Continue Spiriva 1.57mcg two puffs daily in the morning. -Continue Singulair at bedtime. -Refill Albuterol rescue inhaler for use as needed. -Check back in 6 weeks to assess response to increased Advair dose and potential need for biologic  Allergic Rhinitis Persistent symptoms despite previous use of Flonase, Benadryl, Claritin and Zyrtec -Discontinue Flonase due to nasal irritation -Prescribe Astelin nasal spray, use twice daily. -Prescribe Xyzal, use as needed  Sleep Apnea Negative sleep study in September. Some snoring noted but no significant apneic events. -No further action required at this time. If snoring becomes disruptive, consider referral to dentist for oral appliance.  07/16/2023- Interim hx  Discussed the use of AI scribe software for clinical note transcription with the patient, who gave verbal consent to proceed.  History of Present Illness  The patient, with asthma, presents for a follow-up appointment for asthma management.  During a video visit in January, her asthma symptoms had increased, leading to an  increase in her Advair dosage. Despite this adjustment, she continues to experience flare-ups, which she attributes to fluctuating weather conditions. During flare-ups, she experiences a raspy voice, difficulty sleeping, and shortness of breath.  Her asthma management includes Advair, Spiriva, Singulair, and a rescue inhaler (albuterol) as needed. She uses Advair 230-34mcg two puffs in the morning and Spiriva 1.82mcg two puffs once a day. She also takes Singulair and an allergy pill (loratadine or Xyzal) every night. Despite these medications, she sometimes requires albuterol treatments in the middle of the night due to breathing difficulties. These treatments provide some relief but are not completely effective.  In terms of respiratory symptoms, she has coughing up greenish-yellow mucus and experiencing postnasal drip and allergy symptoms. No current respiratory tract infection symptoms. Her last lung function test over a year ago was normal, and a chest x-ray in September was clear. An allergy test in July of the previous year was normal.  She reports persistent bilateral pain since the end of July, exacerbated by cold weather. The pain is persistent throughout the day and somewhat alleviated by heat application. She has tried Tylenol, ibuprofen, baths, and Epsom salts without significant relief. No swelling in her legs, acid reflux, or lifting heavy objects. She works out but does not lift anything over thirty pounds.  She has a history of working in a [employer] from June to October, which exacerbated her asthma symptoms. She does not smoke but is around others who do, although she states it does not bother her.        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 Per consultation with Dr. Earlene Plater, no further work-up necessary. Per MFM: " The patient was reassured that the anti-Lewis antibodies are usually of the IgM subtype and therefore we will not cross the placenta and cause fetal anemia.  The Lewis antibodies should therefore not affect the management of her current pregnancy."   PTSD (post-traumatic stress disorder)    Vaginal trichomoniasis 05/09/2019   04/30/2019 on Pap [] tx [] TOC- neg   Vitamin D deficiency     Tobacco History: Social History   Tobacco Use  Smoking Status Never   Passive exposure: Never  Smokeless Tobacco Never   Counseling given: Not Answered   Outpatient Medications Prior to Visit  Medication Sig Dispense Refill   albuterol (PROVENTIL) (2.5 MG/3ML) 0.083% nebulizer solution Take 3 mLs (2.5 mg total) by nebulization every 6 (six) hours as needed for wheezing or shortness of breath. 150 mL 1   albuterol (VENTOLIN HFA) 108 (90 Base) MCG/ACT inhaler Inhale 2 puffs into the lungs every 6 (six) hours as needed for wheezing or shortness of breath. 8 g 2   azelastine (ASTELIN) 0.1 % nasal spray Place 1 spray into both  nostrils 2 (two) times daily. Use in each nostril as directed 30 mL 5   fluticasone-salmeterol (ADVAIR HFA) 230-21 MCG/ACT inhaler Inhale 2 puffs into the lungs 2 (two) times daily. 1 each 5   glycopyrrolate (ROBINUL) 1 MG tablet Take 1 mg by mouth 3 (three) times daily.     levocetirizine (XYZAL) 5 MG tablet Take 1 tablet (5 mg total) by mouth every evening. 30 tablet 5   montelukast (SINGULAIR) 10 MG tablet Take 1 tablet (10 mg total) by mouth at bedtime. 30  tablet 2   phentermine 15 MG capsule Take 15 mg by mouth 2 (two) times daily.     prazosin (MINIPRESS) 1 MG capsule Take 1 capsule (1 mg total) by mouth at bedtime. 30 capsule 3   Spacer/Aero-Holding Chambers (AEROCHAMBER MV) inhaler Use as instructed 1 each 0   Tiotropium Bromide Monohydrate (SPIRIVA RESPIMAT) 1.25 MCG/ACT AERS Inhale 2 puffs into the lungs daily. 4 g 5   topiramate (TOPAMAX) 25 MG tablet Take 25 mg by mouth daily.     gabapentin (NEURONTIN) 300 MG capsule Take 1 capsule (300 mg total) by mouth 3 (three) times daily. (Patient not taking: Reported on 06/01/2023) 90 capsule 3   naproxen sodium (ANAPROX DS) 550 MG tablet Take 1 tablet (550 mg total) by mouth 2 (two) times daily with a meal. 20 tablet 0   QUEtiapine (SEROQUEL) 50 MG tablet Take 1 tablet (50 mg total) by mouth daily. (Patient not taking: Reported on 06/01/2023) 30 tablet 3   sertraline (ZOLOFT) 50 MG tablet Take 1 tablet (50 mg total) by mouth daily. (Patient not taking: Reported on 06/01/2023) 30 tablet 3   Facility-Administered Medications Prior to Visit  Medication Dose Route Frequency Provider Last Rate Last Admin   albuterol (PROVENTIL) (2.5 MG/3ML) 0.083% nebulizer solution 2.5 mg  2.5 mg Nebulization Q6H PRN Oretha Milch, MD        Review of Systems  Review of Systems  Constitutional: Negative.   HENT:  Positive for postnasal drip.   Respiratory:  Positive for chest tightness and shortness of breath.    Physical Exam  BP 126/74 (BP Location: Left Arm, Patient Position: Sitting, Cuff Size: Large)   Pulse 74   Temp (!) 97.4 F (36.3 C) (Temporal)   Ht 5' (1.524 m)   Wt 183 lb 12.8 oz (83.4 kg)   SpO2 98%   BMI 35.90 kg/m  Physical Exam Constitutional:      General: She is not in acute distress.    Appearance: Normal appearance. She is not ill-appearing or toxic-appearing.  HENT:     Head: Normocephalic and atraumatic.  Cardiovascular:     Rate and Rhythm: Normal rate and regular rhythm.   Pulmonary:     Effort: Pulmonary effort is normal.     Breath sounds: Normal breath sounds. No wheezing.  Musculoskeletal:        General: Normal range of motion.  Skin:    General: Skin is warm and dry.  Neurological:     General: No focal deficit present.     Mental Status: She is alert and oriented to person, place, and time. Mental status is at baseline.  Psychiatric:        Mood and Affect: Mood normal.        Behavior: Behavior normal.        Thought Content: Thought content normal.        Judgment: Judgment normal.      Lab Results:  CBC    Component Value Date/Time   WBC 5.6 12/01/2022 1428   WBC 5.7 09/26/2022 2220   RBC 4.93 12/01/2022 1428   RBC 4.79 09/26/2022 2220   HGB 13.4 12/01/2022 1428   HCT 42.2 12/01/2022 1428   PLT 265 12/01/2022 1428   MCV 86 12/01/2022 1428   MCH 27.2 12/01/2022 1428   MCH 27.3 09/26/2022 2220   MCHC 31.8 12/01/2022 1428   MCHC 31.6 09/26/2022 2220   RDW 13.1 12/01/2022 1428   LYMPHSABS 2.6 12/01/2022 1428   MONOABS 0.4 09/26/2022 2220   EOSABS 0.0 12/01/2022 1428   BASOSABS 0.0 12/01/2022 1428    BMET    Component Value Date/Time   NA 138 09/26/2022 2220   NA 140 07/05/2022 1451   K 3.8 09/26/2022 2220   CL 109 09/26/2022 2220   CO2 21 (L) 09/26/2022 2220   GLUCOSE 90 09/26/2022 2220   BUN 10 09/26/2022 2220   BUN 11 07/05/2022 1451   CREATININE 0.88 09/26/2022 2220   CALCIUM 9.3 09/26/2022 2220   GFRNONAA >60 09/26/2022 2220   GFRAA >60 11/16/2019 0950    BNP No results found for: "BNP"  ProBNP No results found for: "PROBNP"  Imaging: No results found.   Assessment & Plan:    Moderate persistent asthma Persistent symptoms despite increased Advair, Spiriva, Singulair, and Xyzal. Frequent flare-ups with weather changes. No current respiratory tract infection symptoms. Her last lung function test over a year ago was normal, and a chest x-ray in September was clear. An allergy test in July of the previous  year was normal. FENO 19.  -Continue current regimen:Advair 230-77mcg (2 puffs twice daily), Spiriva 1.39mcg two puffs (once daily), Singulair (nightly), Xyzal (nightly), and Astelin (as directed). -Trial a course of prednisone. -Perform fractionated nitric oxide test to assess airway inflammation. -Order labs:CBC, IgE, CRP, sed rate, and D-dimer. -Consider biologics for asthma control pending lab results, may consider referral to allergist and/or obtain methylcholine challenge   Chest Pain Persistent bilateral pleuritic chest pain since July, not related to menstrual cycle or bowel movements. Pain is not relieved by Tylenol or ibuprofen, but heat provides some relief. -Order labs:CRP and sed rate to assess for inflammation. -Consider further CHEST imaging or referral to a specialist if pain persists and labs are inconclusive.  Allergies Symptoms managed with Xyzal and Astelin. -Continue Xyzal and Astelin as directed. -Order a respiratory allergy panel. -Refill allergy medication as needed.  General Health Maintenance -Recommend flu vaccine before October 2025.     Glenford Bayley, NP 07/16/2023

## 2023-07-16 NOTE — Patient Instructions (Addendum)
-  ASTHMA: Asthma is a condition where your airways narrow and swell, making it difficult to breathe. Despite your current medications, you are still experiencing frequent flare-ups. We will continue your current regimen of Advair, Spiriva, Singulair, Xyzal, and Astelin. Additionally, we are starting a course of prednisone to help reduce inflammation. We will also perform a fractionated nitric oxide test to assess airway inflammation and have ordered labs including CBC, IgE, CRP, sed rate, and D-dimer. Depending on the lab results, we may consider biologics for better asthma control.  -CHEST PAIN: You have been experiencing persistent chest pain since July, which is not related to your menstrual cycle and is somewhat relieved by heat. We have ordered CRP and sed rate labs to check for inflammation. If the pain persists and the lab results are inconclusive, we may consider further imaging like a CAT scan of your lungs   -ALLERGIES: Your allergy symptoms are being managed with Xyzal and Astelin. We will continue these medications as directed and have ordered a respiratory allergy panel. Your allergy medication will be refilled as needed.  -GENERAL HEALTH MAINTENANCE: We recommend getting a flu vaccine before October 2025 to help protect against the flu.  Rx: Prednisone taper   INSTRUCTIONS:  Please follow up with the lab tests as ordered. If your chest pain continues or worsens, or if you have any new symptoms, please contact our office. We will review the results of your tests and discuss the next steps for your asthma management and chest pain at your next appointment.  Follow-up 4-6 weeks with Waynetta Sandy NP

## 2023-07-17 LAB — RESPIRATORY ALLERGY PROFILE REGION II ~~LOC~~
Allergen, A. alternata, m6: <0.1 kU/L
Allergen, Cedar tree, t12: <0.1 kU/L
Allergen, Comm Silver Birch, t9: <0.1 kU/L
Allergen, Cottonwood, t14: <0.1 kU/L
Allergen, D pternoyssinus,d7: 2.01 kU/L — ABNORMAL HIGH
Allergen, Mouse Urine Protein, e78: <0.1 kU/L
Allergen, Mulberry, t76: <0.1 kU/L
Allergen, Oak,t7: <0.1 kU/L
Allergen, P. notatum, m1: <0.1 kU/L
Aspergillus fumigatus, m3: <0.1 kU/L
Bermuda Grass: <0.1 kU/L
Box Elder IgE: <0.1 kU/L
CLADOSPORIUM HERBARUM (M2) IGE: <0.1 kU/L
COMMON RAGWEED (SHORT) (W1) IGE: <0.1 kU/L
Cat Dander: <0.1 kU/L
Class: 0
Class: 0
Class: 0
Class: 0
Class: 0
Class: 0
Class: 0
Class: 0
Class: 0
Class: 0
Class: 0
Class: 0
Class: 0
Class: 0
Class: 0
Class: 0
Class: 0
Class: 0
Class: 0
Class: 0
Class: 0
Class: 0
Class: 2
Class: 2
Cockroach: <0.1 kU/L
D. farinae: 1.78 kU/L — ABNORMAL HIGH
Dog Dander: <0.1 kU/L
Elm IgE: <0.1 kU/L
IgE (Immunoglobulin E), Serum: 17 kU/L (ref <=114)
Johnson Grass: <0.1 kU/L
Pecan/Hickory Tree IgE: <0.1 kU/L
Rough Pigweed  IgE: <0.1 kU/L
Sheep Sorrel IgE: <0.1 kU/L
Timothy Grass: <0.1 kU/L

## 2023-07-17 LAB — INTERPRETATION:

## 2023-07-17 LAB — D-DIMER, QUANTITATIVE: D-Dimer, Quant: 0.21 ug{FEU}/mL (ref <0.50)

## 2023-07-31 ENCOUNTER — Telehealth: Payer: Self-pay | Admitting: Primary Care

## 2023-07-31 NOTE — Telephone Encounter (Signed)
 Patient is returning phone call. Patient phone number is [phone].

## 2023-07-31 NOTE — Progress Notes (Signed)
 Labs were normal other than a mild allergy to dust mites. Continue recommendations. If continues to have flare ups can discuss adding biologics but right now at this time wouldn't recommend based on testing

## 2023-07-31 NOTE — Telephone Encounter (Signed)
 Called and spoke to pt per other note. NFN

## 2023-08-27 ENCOUNTER — Encounter: Payer: Self-pay | Admitting: Primary Care

## 2023-08-27 ENCOUNTER — Ambulatory Visit (INDEPENDENT_AMBULATORY_CARE_PROVIDER_SITE_OTHER)

## 2023-08-27 ENCOUNTER — Ambulatory Visit (INDEPENDENT_AMBULATORY_CARE_PROVIDER_SITE_OTHER): Admitting: Primary Care

## 2023-08-27 VITALS — BP 96/66 | HR 76 | Temp 97.8°F | Ht 60.0 in | Wt 187.8 lb

## 2023-08-27 DIAGNOSIS — J309 Allergic rhinitis, unspecified: Secondary | ICD-10-CM | POA: Diagnosis not present

## 2023-08-27 DIAGNOSIS — J4541 Moderate persistent asthma with (acute) exacerbation: Secondary | ICD-10-CM

## 2023-08-27 DIAGNOSIS — R0600 Dyspnea, unspecified: Secondary | ICD-10-CM | POA: Diagnosis not present

## 2023-08-27 LAB — POCT EXHALED NITRIC OXIDE: FeNO level (ppb): 9

## 2023-08-27 MED ORDER — METHYLPREDNISOLONE ACETATE 80 MG/ML IJ SUSP
80.0000 mg | Freq: Once | INTRAMUSCULAR | Status: AC
  Administered 2023-08-27: 80 mg via INTRAMUSCULAR

## 2023-08-27 MED ORDER — TRELEGY ELLIPTA 200-62.5-25 MCG/ACT IN AEPB: 1.0000 | INHALATION_SPRAY | Freq: Every day | RESPIRATORY_TRACT | Status: DC

## 2023-08-27 NOTE — Patient Instructions (Addendum)
-  ASTHMA: Asthma is a condition where your airways narrow and swell, producing extra mucus, which can make breathing difficult. We will administer a steroid injection today for immediate relief. You will start using a sample of Trelegy inhaler tomorrow for two weeks to see if it provides better control than your current medication. We have ordered a methacholine challenge test to confirm your asthma diagnosis and a chest x-ray and EKG to evaluate your chest tightness and pain. Continue using Xyzal , Singulair , and albuterol  as needed. Additionally, keep your windows closed, vacuum regularly, and use hypoallergenic mattress and pillow covers to help manage your symptoms. We will also refer you to an allergist for further evaluation and management.  -ALLERGY  TO DUST MITES: You have a confirmed allergy  to dust mites, which can cause symptoms like facial puffiness and dry skin, and can be worsened by pollen and dust. Continue taking Xyzal  and Singulair  for allergy  management and use Astelin  nasal spray to help with nasal symptoms. Follow environmental control measures such as keeping windows closed, vacuuming regularly, and using hypoallergenic mattress and pillow covers. We will refer you to an allergist for further evaluation and management.  Orders Trelegy sample  EKG, CXR, FENO Refer to allergy  (done)  INSTRUCTIONS: Please follow up with the methacholine challenge test, chest x-ray, and EKG as ordered. Start using the Trelegy inhaler tomorrow (hold Advair and Spiriva  while taking sample) and continue for two weeks. Continue your current medications and follow the environmental control measures advised. We will refer you to an allergist for further evaluation and management.  Follow-up First available with Dr. Dione Franks or Dr. Diania Fortes (30 min new patient)

## 2023-08-27 NOTE — Progress Notes (Unsigned)
 @Patient  ID: Martha Hall, female    DOB: 06/04/1995, 28 y.o.   MRN: [id number]  Chief Complaint  Patient presents with   Follow-up    Asthma F/U    Referring provider: Senaida Dama, NP  HPI: 28 yr old female previously followed by Dr. Elverna Hamman and Dr. Matilde Son for asthma.  Studies: PFT 05/23/22 >> FEV1 2.73 (87%), FEV1% 88, TLC 3.98 (82%), DLCO 105% IgE 12/01/22 >> 28 CBC 12/01/22 >> Eos 0  Previous [facility] pulmonary encounter: 06/01/2023 Discussed the use of AI scribe software for clinical note transcription with the patient, who gave verbal consent to proceed.  History of Present Illness   The patient, with a history of asthma and upper respiratory infections, presents with persistent shortness of breath despite current treatment regimen. She reports that even light activities such as walking trigger this symptom. She also describes a sensation of her lungs feeling "crushed" on some days. The patient has been using an albuterol  rescue inhaler, which she requests a refill for, and an Advair HFA 115-21mcg twice daily. She also takes Montelukast  along at bedtime along with Spiriva  inhaler daily at 2 AM, a timing that was initially set due to her previous overnight work schedule.  She reports having completed a course of prednisone  and cefdinir, for an asthma flare-up in September, prescribed by Dr. Matilde Son. She has discontinued use of Flonase  nasal spray due to nasal irritation. She is no longer taking Seroquel  or Zoloft  due to expiration and lack of contact with her psychiatrist.  The patient also reports a history of nocturnal snoring, but a recent sleep study showed no evidence of sleep apnea. She has a cyst in her nose, identified by an ear, nose, and throat specialist, which she believes may contribute to her frequent upper respiratory infections and sinusitis. She has tried various allergy  medications, including Benadryl , Claritin and Zyrtec  but reports that her symptoms worsen with these  treatments.  The patient has also experienced weight gain associated with prednisone  use, which she dislikes, but acknowledges that the medication improves her breathing. She has attempted lifestyle changes, including weight loss, but reports that her asthma symptoms have worsened. She has also experienced an asthma attack at work, despite not being ill at the time.     Asthma Persistent symptoms despite current regimen of Advair and Spiriva . Discussed the potential need for biologic therapy if symptoms do not improve with increased Advair dose. She was treated for asthma flare up in September with course of prednisone  and cefuroxime . Prednisone  helps but causes weight gain. No current respiratory tract infection symptoms but dyspnea symptoms are worse  -Increase Advair HFA 230-21mcg twice daily  -Continue Spiriva  1.3mcg two puffs daily in the morning. -Continue Singulair  at bedtime. -Refill Albuterol  rescue inhaler for use as needed. -Check back in 6 weeks to assess response to increased Advair dose and potential need for biologic  Allergic Rhinitis Persistent symptoms despite previous use of Flonase , Benadryl , Claritin and Zyrtec  -Discontinue Flonase  due to nasal irritation -Prescribe Astelin  nasal spray, use twice daily. -Prescribe Xyzal , use as needed  Sleep Apnea Negative sleep study in September. Some snoring noted but no significant apneic events. -No further action required at this time. If snoring becomes disruptive, consider referral to dentist for oral appliance.  07/16/2023- Interim hx  Discussed the use of AI scribe software for clinical note transcription with the patient, who gave verbal consent to proceed.  History of Present Illness   The patient, with asthma, presents for a follow-up  appointment for asthma management.  During a video visit in January, her asthma symptoms had increased, leading to an increase in her Advair dosage. Despite this adjustment, she continues to  experience flare-ups, which she attributes to fluctuating weather conditions. During flare-ups, she experiences a raspy voice, difficulty sleeping, and shortness of breath.  Her asthma management includes Advair, Spiriva , Singulair , and a rescue inhaler (albuterol ) as needed. She uses Advair 230-21mcg two puffs in the morning and Spiriva  1.25mcg two puffs once a day. She also takes Singulair  and an allergy  pill (loratadine or Xyzal ) every night. Despite these medications, she sometimes requires albuterol  treatments in the middle of the night due to breathing difficulties. These treatments provide some relief but are not completely effective.  In terms of respiratory symptoms, she has coughing up greenish-yellow mucus and experiencing postnasal drip and allergy  symptoms. No current respiratory tract infection symptoms. Her last lung function test over a year ago was normal, and a chest x-ray in September was clear. An allergy  test in July of the previous year was normal.  She reports persistent bilateral pain since the end of July, exacerbated by cold weather. The pain is persistent throughout the day and somewhat alleviated by heat application. She has tried Tylenol , ibuprofen , baths, and Epsom salts without significant relief. No swelling in her legs, acid reflux, or lifting heavy objects. She works out but does not lift anything over thirty pounds.  She has a history of working in a [employer] from June to October, which exacerbated her asthma symptoms. She does not smoke but is around others who do, although she states it does not bother her.         08/27/2023 Discussed the use of AI scribe software for clinical note transcription with the patient, who gave verbal consent to proceed.  History of Present Illness   Martha Hall is a 28 year old female with asthma who presents for a follow-up due to recent asthma exacerbations.  She experiences shortness of breath and chest  tightness, describing her lungs as feeling 'squished' and having difficulty catching her breath. She had two asthma attacks on Friday, during which she felt like she couldn't breathe and experienced chest pain. Weather changes, particularly rain, exacerbate her symptoms. Albuterol  provides some relief, but she still struggles to catch her breath and experiences lung pain. She used her nebulizer at home during the asthma attack on Friday and received a breathing treatment from the ambulance but did not go to the hospital.  She uses Advair, taking two puffs in the morning and evening, and montelukast , along with allergy  medications Xyzal  and Singulair . She was previously on prednisone  in March, which helped slightly, but she is concerned about frequent use.  She has a history of allergy  testing showing mild to moderate allergies to dust mites. She experiences consistent shortness of breath, especially during warmer months, and notes that pollen levels affect her symptoms.  She reports sleep disturbances, feeling tired despite sleeping from 8 PM to morning, and has a history of snoring. A previous sleep apnea test was negative, though she experienced some snoring and a drop in oxygen levels. She uses a nasal spray to help with nasal congestion.  No recent travel or leg swelling. She experiences wheezing before flare-ups, often associated with nasal congestion.        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 Per consultation with Dr. Nolon Baxter, no further work-up necessary. Per MFM: " The patient was reassured that the anti-Lewis antibodies are usually of the IgM subtype and therefore we will not cross the placenta and cause fetal anemia.  The Lewis antibodies should therefore not affect the management of her current pregnancy."   PTSD (post-traumatic stress disorder)    Vaginal trichomoniasis 05/09/2019   04/30/2019 on Pap [] tx [] TOC- neg   Vitamin D  deficiency     Tobacco History: Social History   Tobacco Use  Smoking Status Never   Passive exposure: Never  Smokeless Tobacco Never   Counseling given: Not Answered   Outpatient Medications Prior to Visit  Medication Sig Dispense Refill   albuterol  (PROVENTIL ) (2.5 MG/3ML) 0.083% nebulizer solution Take 3 mLs (2.5 mg total) by nebulization every 6 (six) hours as needed for wheezing or shortness of breath. 150 mL 1   albuterol  (VENTOLIN  HFA) 108 (90 Base) MCG/ACT inhaler Inhale 2 puffs into the lungs every 6 (six) hours as needed for wheezing or shortness of breath. 8 g 2   azelastine  (ASTELIN ) 0.1 % nasal spray Place 1 spray into both nostrils 2 (two) times daily. Use in each nostril as directed 30 mL 5   fluticasone -salmeterol (ADVAIR HFA) 230-21 MCG/ACT inhaler Inhale 2 puffs into the lungs 2 (two) times daily. 1 each 5   glycopyrrolate (ROBINUL) 1 MG tablet Take 1 mg by mouth 3 (three) times daily.     levocetirizine (XYZAL ) 5 MG tablet Take 1 tablet (5 mg total) by mouth every evening. 30 tablet 5   montelukast  (SINGULAIR ) 10 MG tablet Take 1 tablet (10 mg total) by mouth at bedtime. 30 tablet 2   phentermine 15 MG capsule Take 15 mg by mouth 2 (two) times daily.     prazosin  (MINIPRESS ) 1 MG capsule Take 1 capsule (1 mg total) by mouth at bedtime. 30 capsule 3   predniSONE  (DELTASONE ) 10 MG tablet 4 tabs for 3 days, then 3 tabs for 3 days, 2 tabs  for 3 days, then 1 tab for 3 days, then stop 30 tablet 0   Spacer/Aero-Holding Chambers (AEROCHAMBER MV) inhaler Use as instructed 1 each 0   Tiotropium Bromide Monohydrate  (SPIRIVA  RESPIMAT) 1.25 MCG/ACT AERS Inhale 2 puffs into the lungs daily. 4 g 5   topiramate (TOPAMAX) 25 MG tablet Take 25 mg by mouth daily.     Facility-Administered Medications Prior to Visit  Medication Dose Route Frequency Provider Last Rate Last Admin   albuterol  (PROVENTIL ) (2.5 MG/3ML) 0.083% nebulizer solution 2.5 mg  2.5 mg Nebulization Q6H PRN Lind Repine, MD       Review of Systems  Review of Systems  Constitutional: Negative.   HENT: Negative.    Respiratory:  Positive for chest tightness and shortness of breath.   Cardiovascular: Negative.     Physical Exam  BP 96/66 (BP Location: Right Arm, Patient Position: Sitting, Cuff Size: Large)   Pulse 76   Temp 97.8 F (36.6 C) (Temporal)   Ht 5' (1.524 m)  Wt 187 lb 12.8 oz (85.2 kg)   SpO2 100%   BMI 36.68 kg/m  Physical Exam Constitutional:      General: She is not in acute distress.    Appearance: Normal appearance. She is not ill-appearing.  HENT:     Head: Normocephalic and atraumatic.     Mouth/Throat:     Mouth: Mucous membranes are moist.     Pharynx: Oropharynx is clear.  Cardiovascular:     Rate and Rhythm: Normal rate and regular rhythm.  Pulmonary:     Effort: Pulmonary effort is normal.     Breath sounds: No wheezing.     Comments: CTA Skin:    General: Skin is warm and dry.  Neurological:     General: No focal deficit present.     Mental Status: She is alert and oriented to person, place, and time. Mental status is at baseline.  Psychiatric:        Mood and Affect: Mood normal.        Behavior: Behavior normal.        Thought Content: Thought content normal.        Judgment: Judgment normal.      Lab Results:  CBC    Component Value Date/Time   WBC 5.2 07/16/2023 0943   RBC 4.19 07/16/2023 0943   HGB 12.0  07/16/2023 0943   HGB 13.4 12/01/2022 1428   HCT 37.2 07/16/2023 0943   HCT 42.2 12/01/2022 1428   PLT 315.0 07/16/2023 0943   PLT 265 12/01/2022 1428   MCV 88.7 07/16/2023 0943   MCV 86 12/01/2022 1428   MCH 27.2 12/01/2022 1428   MCH 27.3 09/26/2022 2220   MCHC 32.2 07/16/2023 0943   RDW 14.3 07/16/2023 0943   RDW 13.1 12/01/2022 1428   LYMPHSABS 1.6 07/16/2023 0943   LYMPHSABS 2.6 12/01/2022 1428   MONOABS 0.3 07/16/2023 0943   EOSABS 0.0 07/16/2023 0943   EOSABS 0.0 12/01/2022 1428   BASOSABS 0.0 07/16/2023 0943   BASOSABS 0.0 12/01/2022 1428    BMET    Component Value Date/Time   NA 138 09/26/2022 2220   NA 140 07/05/2022 1451   K 3.8 09/26/2022 2220   CL 109 09/26/2022 2220   CO2 21 (L) 09/26/2022 2220   GLUCOSE 90 09/26/2022 2220   BUN 10 09/26/2022 2220   BUN 11 07/05/2022 1451   CREATININE 0.88 09/26/2022 2220   CALCIUM  9.3 09/26/2022 2220   GFRNONAA >60 09/26/2022 2220   GFRAA >60 11/16/2019 0950    BNP No results found for: "BNP"  ProBNP No results found for: "PROBNP"  Imaging: No results found.   Assessment & Plan:   1. Moderate persistent asthma with acute exacerbation (Primary) - Ambulatory referral to Allergy  - DG Chest 2 View; Future - EKG 12-Lead - POCT EXHALED NITRIC OXIDE  - Pulmonary Function Test; Future - methylPREDNISolone  acetate (DEPO-MEDROL ) injection 80 mg  2. Allergic rhinitis, unspecified seasonality, unspecified trigger - Ambulatory referral to Allergy  - DG Chest 2 View; Future  Assessment and Plan    Asthma Recurrent exacerbations, especially in warmer months with high pollen. Symptoms include dyspnea, chest tightness, and wheezing. Previous spirometry normal.  Current treatment with Advair, montelukast , and albuterol  with partial relief. Considering Trelegy for better control.  Recommend getting methacholine challenge test to confirm dx asthma as much of her testings has been normal, however, clinical symptoms remains  suggestive of asthma. - Administer 80mg  Depo steroid injection for acute symptom relief. - Provide sample  of Trelegy 200mcg inhaler to replace Advair and Spiriva , start tomorrow for two weeks. - Order methacholine challenge test to confirm asthma diagnosis. - Order chest x-ray and EKG due to chest tightness and pain. - Continue Xyzal , Singulair , and albuterol  as needed. - Advise on environmental control measures: keep windows closed, regular vacuuming, use hypoallergenic mattress and pillow covers. - Refer to allergist for further evaluation and management.  Chest pain Chest pain likely related to asthma flare. EKG showed normal sinus rhythm without ST changes.  Allergy  to dust mites Class 2 allergy  confirmed by testing. Symptoms include periorbital edema, facial puffiness, and xerosis, exacerbated by pollen and dust. - Continue Xyzal  and Singulair  for allergy  management. - Use Astelin  nasal spray to manage nasal symptoms. - Advise on environmental control measures: keep windows closed, regular vacuuming, use hypoallergenic mattress and pillow covers. - Refer to allergist for further evaluation and management.       Antonio Baumgarten, NP 08/27/2023

## 2023-09-07 ENCOUNTER — Encounter: Payer: Medicaid Other | Admitting: Family

## 2023-09-07 NOTE — Progress Notes (Signed)
 Erroneous encounter-disregard

## 2023-09-15 ENCOUNTER — Telehealth

## 2023-09-15 DIAGNOSIS — R112 Nausea with vomiting, unspecified: Secondary | ICD-10-CM

## 2023-09-16 ENCOUNTER — Encounter: Payer: Self-pay | Admitting: Physician Assistant

## 2023-09-16 MED ORDER — ONDANSETRON 4 MG PO TBDP
4.0000 mg | ORAL_TABLET | Freq: Three times a day (TID) | ORAL | 0 refills | Status: DC | PRN
Start: 2023-09-16 — End: 2024-01-31

## 2023-09-16 NOTE — Progress Notes (Signed)

## 2023-09-17 ENCOUNTER — Ambulatory Visit (INDEPENDENT_AMBULATORY_CARE_PROVIDER_SITE_OTHER)

## 2023-09-17 ENCOUNTER — Ambulatory Visit
Admission: RE | Admit: 2023-09-17 | Discharge: 2023-09-17 | Disposition: A | Discharge status: Home / Self Care | Source: Ambulatory Visit | Attending: Family Medicine | Admitting: Family Medicine

## 2023-09-17 ENCOUNTER — Other Ambulatory Visit: Payer: Self-pay

## 2023-09-17 VITALS — BP 103/69 | HR 70 | Temp 98.1°F | Resp 16

## 2023-09-17 DIAGNOSIS — R110 Nausea: Secondary | ICD-10-CM

## 2023-09-17 DIAGNOSIS — R1084 Generalized abdominal pain: Secondary | ICD-10-CM | POA: Diagnosis not present

## 2023-09-17 LAB — POCT URINE PREGNANCY: Preg Test, Ur: NEGATIVE

## 2023-09-17 LAB — POCT URINALYSIS DIP (MANUAL ENTRY)
Bilirubin, UA: NEGATIVE
Blood, UA: NEGATIVE
Glucose, UA: NEGATIVE mg/dL
Ketones, POC UA: NEGATIVE mg/dL
Leukocytes, UA: NEGATIVE
Nitrite, UA: NEGATIVE
Protein Ur, POC: NEGATIVE mg/dL
Spec Grav, UA: 1.025 (ref 1.010–1.025)
Urobilinogen, UA: 0.2 U/dL
pH, UA: 6 (ref 5.0–8.0)

## 2023-09-17 MED ORDER — FAMOTIDINE 40 MG PO TABS: 40.0000 mg | ORAL_TABLET | Freq: Every day | ORAL | 0 refills | Status: DC

## 2023-09-17 NOTE — Discharge Instructions (Addendum)
 Start famotidine  once a day for the next 7 to 14 days.  Continue with nausea medication you were previously prescribed as needed.  Continue to rest and stay hydrated with lots of fluids.  Please follow-up with your PCP in 2 to 3 days for recheck.  Please go to the ER if you develop any worsening symptoms.  Hope you feel better soon!

## 2023-09-17 NOTE — ED Triage Notes (Signed)
 Pt c/o mid abdominal pain and [id number]. Pt denies vomiting or diarrhea.

## 2023-09-17 NOTE — ED Provider Notes (Signed)
 [facility]    CSN: [id number] Arrival date & time: 09/17/23  0900      History   Chief Complaint Chief Complaint  Patient presents with   Abdominal Pain    Entered by patient    [name] is a 28 y.o. female presents for abdominal pain.  Patient reports 3 weeks of nausea without vomiting with onset of mid abdominal pain over the past 3 days.  Denies any fevers, chills, URI symptoms, diarrhea or constipation, bloating, dysuria, back pain.  Reports last BM was yesterday and was normal for her.  States her symptoms do improve when she eats but then returns shortly thereafter.  Cannot identify any aggravating factors.  Reports a history of cholecystectomy and colitis but states this feels nothing like when she had colitis in the past.  Denies any recent travel or known sick contacts.  She did an e-visit on 5/10 and was prescribed Zofran  which she states is not helping much.  No other OTC treatments have been used.  No other concerns at this time.   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 Per consultation with Dr. Nolon Baxter, no further work-up necessary. Per MFM: " The patient was reassured that the anti-Lewis antibodies are usually of the IgM subtype and therefore we will not cross the placenta and cause fetal anemia.  The Lewis antibodies should therefore not affect the management of her current pregnancy."   PTSD (post-traumatic stress disorder)    Vaginal trichomoniasis 05/09/2019   04/30/2019 on Pap [] tx [] TOC- neg   Vitamin D  deficiency     Patient Active Problem List   Diagnosis Date Noted   Acanthosis nigricans  07/05/2022   Hyperhidrosis 07/04/2022   Moderate episode of recurrent major depressive disorder (HCC) 12/15/2021   PTSD (post-traumatic stress disorder) 12/15/2021   GAD (generalized anxiety disorder) 12/15/2021   Prediabetes 06/07/2021   Hospital discharge follow-up 02/04/2021   Choledocholithiasis    Cholelithiasis 01/25/2021   History of hydrocephalus 06/11/2019   History of blood clot in brain 05/28/2019   Alpha thalassemia silent carrier 05/12/2019   Morbid obesity (HCC) 04/30/2019    Past Surgical History:  Procedure Laterality Date   BRAIN SURGERY  August 04, 1995   stint placed when born   CHOLECYSTECTOMY N/A 01/25/2021   Procedure: LAPAROSCOPIC CHOLECYSTECTOMY WITH ATTEMPTED CHOLANGIOGRAM;  Surgeon: Oza Blumenthal, MD;  Location: [facility] OR;  Service: General;  Laterality: N/A;   ERCP N/A 01/29/2021   Procedure: ENDOSCOPIC RETROGRADE CHOLANGIOPANCREATOGRAPHY (ERCP);  Surgeon: Kenney Peacemaker, MD;  Location: [facility] ENDOSCOPY;  Service: Endoscopy;  Laterality: N/A;   IR RADIOLOGIST EVAL & MGMT  03/23/2021   REMOVAL OF STONES  01/29/2021   Procedure: REMOVAL OF STONES;  Surgeon: Kenney Peacemaker, MD;  Location: [facility] ENDOSCOPY;  Service: Endoscopy;;   SPHINCTEROTOMY  01/29/2021   Procedure: [address];  Surgeon: Kenney Peacemaker, MD;  Location: [facility] ENDOSCOPY;  Service: Endoscopy;;    OB History     Gravida  1   Para  1   Term  1   Preterm      AB  0  Living  1      SAB  0   IAB      Ectopic      Multiple  0   Live Births  1            Home Medications    Prior to Admission medications   Medication Sig Start Date End Date Taking? Authorizing Provider  famotidine  (PEPCID ) 40 MG tablet Take 1 tablet (40 mg total) by mouth daily for 14 days. 09/17/23 10/01/23 Yes Husain Costabile, Jodi R, NP  albuterol  (PROVENTIL ) (2.5 MG/3ML) 0.083% nebulizer solution Take 3 mLs (2.5 mg total) by nebulization every 6 (six) hours as needed for wheezing or shortness of breath. 06/05/23   Antonio Baumgarten, NP  albuterol  (VENTOLIN  HFA) 108 (90 Base) MCG/ACT inhaler Inhale 2 puffs into the lungs every 6 (six) hours as needed for wheezing or shortness of breath. 12/13/22   Lind Repine, MD  azelastine  (ASTELIN ) 0.1 % nasal spray Place 1 spray into both nostrils 2 (two) times daily. Use in each nostril as directed 06/01/23   Antonio Baumgarten, NP  fluticasone -salmeterol (ADVAIR HFA) 230-21 MCG/ACT inhaler Inhale 2 puffs into the lungs 2 (two) times daily. 06/01/23   Antonio Baumgarten, NP  Fluticasone -Umeclidin-Vilant (TRELEGY ELLIPTA ) 200-62.5-25 MCG/ACT AEPB Inhale 1 puff into the lungs daily. 08/27/23   Antonio Baumgarten, NP  glycopyrrolate (ROBINUL) 1 MG tablet Take 1 mg by mouth 3 (three) times daily. 07/12/22   [provider]  levocetirizine (XYZAL ) 5 MG tablet Take 1 tablet (5 mg total) by mouth every evening. 06/01/23   Antonio Baumgarten, NP  montelukast  (SINGULAIR ) 10 MG tablet Take 1 tablet (10 mg total) by mouth at bedtime. 03/09/23   Antonio Baumgarten, NP  ondansetron  (ZOFRAN -ODT) 4 MG disintegrating tablet Take 1 tablet (4 mg total) by mouth every 8 (eight) hours as needed for nausea or vomiting. 09/16/23   Mayers, Cari S, PA-C  phentermine 15 MG capsule Take 15 mg by mouth 2 (two) times daily. 08/01/22   [provider]  prazosin  (MINIPRESS ) 1 MG capsule Take 1 capsule (1 mg total) by mouth at bedtime. 03/09/22   Arlyne Bering, NP  predniSONE  (DELTASONE ) 10 MG tablet 4 tabs for 3 days, then 3 tabs for 3 days, 2 tabs for 3 days, then 1 tab for 3 days, then stop 07/16/23   Antonio Baumgarten, NP  Spacer/Aero-Holding Chambers (AEROCHAMBER MV) inhaler Use as instructed 04/12/22   Marine Sia, MD  Tiotropium Bromide Monohydrate  (SPIRIVA  RESPIMAT) 1.25 MCG/ACT AERS Inhale 2 puffs into the lungs daily. 03/09/23   Antonio Baumgarten, NP  topiramate (TOPAMAX) 25 MG tablet Take 25 mg by mouth daily. 07/30/22   [provider]  diphenhydrAMINE  (BENADRYL ) 25 MG  tablet Take 12.5 mg by mouth every 6 (six) hours as needed for allergies. Patient not taking: Reported on 12/30/2019  06/21/20  [provider]    Family History Family History  Problem Relation Age of Onset   Healthy Mother    Healthy Father     Social History Social History   Tobacco Use   Smoking status: Never    Passive exposure: Never   Smokeless tobacco: Never  Vaping Use   Vaping status: Never Used  Substance Use Topics   Alcohol use: Never   Drug use: Never     Allergies   Metoclopramide    Review of Systems Review of Systems  Gastrointestinal:  Positive for abdominal pain and  nausea.     Physical Exam Triage Vital Signs ED Triage Vitals  Encounter Vitals Group     BP 09/17/23 0921 98/67     Systolic BP Percentile --      Diastolic BP Percentile --      Pulse Rate 09/17/23 0921 70     Resp 09/17/23 0921 16     Temp 09/17/23 0921 98.1 F (36.7 C)     Temp Source 09/17/23 0921 Oral     SpO2 09/17/23 0921 97 %     Weight --      Height --      Head Circumference --      Peak Flow --      Pain Score 09/17/23 0918 7     Pain Loc --      Pain Education --      Exclude from Growth Chart --    No data found.  Updated Vital Signs BP 103/69   Pulse 70   Temp 98.1 F (36.7 C) (Oral)   Resp 16   LMP 09/02/2023   SpO2 97%   Visual Acuity Right Eye Distance:   Left Eye Distance:   Bilateral Distance:    Right Eye Near:   Left Eye Near:    Bilateral Near:     Physical Exam Vitals and nursing note reviewed.  Constitutional:      General: She is not in acute distress.    Appearance: Normal appearance. She is not ill-appearing.  HENT:     Head: Normocephalic and atraumatic.  Eyes:     Pupils: Pupils are equal, round, and reactive to light.  Cardiovascular:     Rate and Rhythm: Normal rate.  Pulmonary:     Effort: Pulmonary effort is normal.  Abdominal:     General: Bowel sounds are normal.     Palpations: Abdomen is soft. There  is no hepatomegaly or splenomegaly.     Tenderness: There is generalized abdominal tenderness. Negative signs include Rovsing's sign and McBurney's sign.     Comments: Abd is obese  Skin:    General: Skin is warm and dry.  Neurological:     General: No focal deficit present.     Mental Status: She is alert and oriented to person, place, and time.  Psychiatric:        Mood and Affect: Mood normal.        Behavior: Behavior normal.      UC Treatments / Results  Labs (all labs ordered are listed, but only abnormal results are displayed) Labs Reviewed  POCT URINE PREGNANCY  POCT URINALYSIS DIP (MANUAL ENTRY)    EKG   Radiology No results found.  Procedures Procedures (including critical care time)  Medications Ordered in UC Medications - No data to display  Initial Impression / Assessment and Plan / UC Course  I have reviewed the triage vital signs and the nursing notes.  Pertinent labs & imaging results that were available during my care of the patient were reviewed by me and considered in my medical decision making (see chart for details).     Reviewed exam and symptoms with patient.  No red flags.  She is well-appearing and in no acute distress.  No signs of acute abdomen on exam.  Urine and hCG negative.  Wet read of abdominal x-ray with some constipation otherwise no obvious abnormalities, will contact for any positive results based on radiology overread once available.  Discussed possible gastritis, will do trial of  famotidine .  She can continue the Zofran  that was previous prescribed as needed.  Advised her to see her PCP in 2 to 3 days for recheck.  Strict ER precautions reviewed and patient verbalized understanding. Final Clinical Impressions(s) / UC Diagnoses   Final diagnoses:  Generalized abdominal pain  Nausea without vomiting     Discharge Instructions      Start famotidine  once a day for the next 7 to 14 days.  Continue with nausea medication you were  previously prescribed as needed.  Continue to rest and stay hydrated with lots of fluids.  Please follow-up with your PCP in 2 to 3 days for recheck.  Please go to the ER if you develop any worsening symptoms.  Hope you feel better soon!   ED Prescriptions     Medication Sig Dispense Auth. Provider   famotidine  (PEPCID ) 40 MG tablet Take 1 tablet (40 mg total) by mouth daily for 14 days. 14 tablet Jhaniya Briski, Jodi R, NP      PDMP not reviewed this encounter.   Alleen Arbour, NP 09/17/23 1022

## 2023-09-20 MED ORDER — PREDNISONE 10 MG PO TABS: ORAL_TABLET | ORAL | 0 refills | Status: DC

## 2023-09-20 NOTE — Telephone Encounter (Signed)
 Continue Singulair , Xyzal  and Astelin  Use ocean saline nasal / netti pot twice daily Can also take over the counter chlor-tabs 4mg  every 4-6 hour for allergy  symptoms  Ill also send in prednisone 

## 2023-10-02 ENCOUNTER — Ambulatory Visit: Admitting: Internal Medicine

## 2023-10-03 ENCOUNTER — Ambulatory Visit

## 2023-10-08 ENCOUNTER — Other Ambulatory Visit: Payer: Self-pay | Admitting: Medical Genetics

## 2023-10-15 ENCOUNTER — Encounter: Admitting: Nurse Practitioner

## 2023-10-15 DIAGNOSIS — F330 Major depressive disorder, recurrent, mild: Secondary | ICD-10-CM | POA: Diagnosis not present

## 2023-10-15 DIAGNOSIS — F411 Generalized anxiety disorder: Secondary | ICD-10-CM | POA: Diagnosis not present

## 2023-10-15 DIAGNOSIS — F3111 Bipolar disorder, current episode manic without psychotic features, mild: Secondary | ICD-10-CM | POA: Diagnosis not present

## 2023-10-23 ENCOUNTER — Ambulatory Visit
Admission: RE | Admit: 2023-10-23 | Discharge: 2023-10-23 | Disposition: A | Discharge status: Home / Self Care | Source: Ambulatory Visit | Attending: Family Medicine

## 2023-10-23 VITALS — BP 108/72 | HR 61 | Temp 98.0°F | Resp 16

## 2023-10-23 DIAGNOSIS — Z113 Encounter for screening for infections with a predominantly sexual mode of transmission: Secondary | ICD-10-CM | POA: Diagnosis not present

## 2023-10-23 NOTE — ED Triage Notes (Signed)
Pt requesting STD testing-denies sx-NAD-steady gait

## 2023-10-23 NOTE — ED Provider Notes (Signed)
 [facility]  Note:  This document was prepared using Conservation officer, historic buildings and may include unintentional dictation errors.  MRN: [id number] DOB: Jun 27, 1995  Subjective:   Martha Hall is a 28 y.o. female presenting for STI testing.  Patient reports that her sex partner cheated on her.  Denies fever, n/v, abdominal pain, pelvic pain, rashes, dysuria, urinary frequency, hematuria, vaginal discharge.  Would like a complete panel including HIV and syphilis testing as well as vaginal cytology.   Current Facility-Administered Medications:    albuterol  (PROVENTIL ) (2.5 MG/3ML) 0.083% nebulizer solution 2.5 mg, 2.5 mg, Nebulization, Q6H PRN, Alva, Rakesh V, MD  Current Outpatient Medications:    albuterol  (PROVENTIL ) (2.5 MG/3ML) 0.083% nebulizer solution, Take 3 mLs (2.5 mg total) by nebulization every 6 (six) hours as needed for wheezing or shortness of breath., Disp: 150 mL, Rfl: 1   albuterol  (VENTOLIN  HFA) 108 (90 Base) MCG/ACT inhaler, Inhale 2 puffs into the lungs every 6 (six) hours as needed for wheezing or shortness of breath., Disp: 8 g, Rfl: 2   azelastine  (ASTELIN ) 0.1 % nasal spray, Place 1 spray into both nostrils 2 (two) times daily. Use in each nostril as directed, Disp: 30 mL, Rfl: 5   famotidine  (PEPCID ) 40 MG tablet, Take 1 tablet (40 mg total) by mouth daily for 14 days., Disp: 14 tablet, Rfl: 0   fluticasone -salmeterol (ADVAIR HFA) 230-21 MCG/ACT inhaler, Inhale 2 puffs into the lungs 2 (two) times daily., Disp: 1 each, Rfl: 5   Fluticasone -Umeclidin-Vilant (TRELEGY ELLIPTA ) 200-62.5-25 MCG/ACT AEPB, Inhale 1 puff into the lungs daily., Disp: , Rfl:    glycopyrrolate (ROBINUL) 1 MG tablet, Take 1 mg by mouth 3 (three) times daily., Disp: , Rfl:    levocetirizine (XYZAL ) 5 MG tablet, Take 1 tablet (5 mg total) by mouth every evening., Disp: 30 tablet, Rfl: 5   montelukast  (SINGULAIR ) 10 MG tablet, Take 1 tablet (10 mg total) by mouth at  bedtime., Disp: 30 tablet, Rfl: 2   ondansetron  (ZOFRAN -ODT) 4 MG disintegrating tablet, Take 1 tablet (4 mg total) by mouth every 8 (eight) hours as needed for nausea or vomiting., Disp: 20 tablet, Rfl: 0   phentermine 15 MG capsule, Take 15 mg by mouth 2 (two) times daily., Disp: , Rfl:    prazosin  (MINIPRESS ) 1 MG capsule, Take 1 capsule (1 mg total) by mouth at bedtime., Disp: 30 capsule, Rfl: 3   predniSONE  (DELTASONE ) 10 MG tablet, 4 tabs for 3 days, then 3 tabs for 3 days, 2 tabs for 3 days, then 1 tab for 3 days, then stop, Disp: 30 tablet, Rfl: 0   Spacer/Aero-Holding Chambers (AEROCHAMBER MV) inhaler, Use as instructed, Disp: 1 each, Rfl: 0   Tiotropium Bromide Monohydrate  (SPIRIVA  RESPIMAT) 1.25 MCG/ACT AERS, Inhale 2 puffs into the lungs daily., Disp: 4 g, Rfl: 5   topiramate (TOPAMAX) 25 MG tablet, Take 25 mg by mouth daily., Disp: , Rfl:    Allergies  Allergen Reactions   Metoclopramide  Hives and Itching    Past Medical History:  Diagnosis Date   Choledocholithiasis    Cholelithiasis    GAD (generalized anxiety disorder)    Hematoma (intraabdominal) after cholecystectomy    Hydrocephalus (HCC)    hx of   Hyperhidrosis    Infection    UTI   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 Per consultation with Dr. Nolon Baxter, no further work-up necessary. Per MFM:  The patient was reassured  that the anti-Lewis antibodies are usually of the IgM subtype and therefore we will not cross the placenta and cause fetal anemia.  The Lewis antibodies should therefore not affect the management of her current pregnancy.   PTSD (post-traumatic stress disorder)    Vaginal trichomoniasis 05/09/2019   04/30/2019 on Pap [] tx [] TOC- neg   Vitamin D  deficiency      Past Surgical History:  Procedure Laterality Date   BRAIN SURGERY  17-Jun-1995   stint placed when born   CHOLECYSTECTOMY N/A 01/25/2021   Procedure: LAPAROSCOPIC CHOLECYSTECTOMY  WITH ATTEMPTED CHOLANGIOGRAM;  Surgeon: Oza Blumenthal, MD;  Location: [facility] OR;  Service: General;  Laterality: N/A;   ERCP N/A 01/29/2021   Procedure: ENDOSCOPIC RETROGRADE CHOLANGIOPANCREATOGRAPHY (ERCP);  Surgeon: Kenney Peacemaker, MD;  Location: [facility] ENDOSCOPY;  Service: Endoscopy;  Laterality: N/A;   IR RADIOLOGIST EVAL & MGMT  03/23/2021   REMOVAL OF STONES  01/29/2021   Procedure: REMOVAL OF STONES;  Surgeon: Kenney Peacemaker, MD;  Location[facility] ENDOSCOPY;  Service: Endoscopy;;   SPHINCTEROTOMY  01/29/2021   Procedure: [address];  Surgeon: Kenney Peacemaker, MD;  Location: [facility] ENDOSCOPY;  Service: Endoscopy;;    Family History  Problem Relation Age of Onset   Healthy Mother    Healthy Father     Social History   Tobacco Use   Smoking status: Never    Passive exposure: Never   Smokeless tobacco: Never  Vaping Use   Vaping status: Never Used  Substance Use Topics   Alcohol use: Never   Drug use: Never    ROS   Objective:   Vitals: BP 108/72 (BP Location: Right Arm)   Pulse 61   Temp 98 F (36.7 C) (Oral)   Resp 16   LMP 09/23/2023   SpO2 99%   Physical Exam Constitutional:      General: She is not in acute distress.    Appearance: Normal appearance. She is well-developed. She is not ill-appearing, toxic-appearing or diaphoretic.  HENT:     Head: Normocephalic and atraumatic.     Nose: Nose normal.     Mouth/Throat:     Mouth: Mucous membranes are moist.   Eyes:     General: No scleral icterus.       Right eye: No discharge.        Left eye: No discharge.     Extraocular Movements: Extraocular movements intact.    Cardiovascular:     Rate and Rhythm: Normal rate.  Pulmonary:     Effort: Pulmonary effort is normal.   Skin:    General: Skin is warm and dry.   Neurological:     General: No focal deficit present.     Mental Status: She is alert and oriented to person, place, and time.   Psychiatric:        Mood and Affect: Mood normal.         Behavior: Behavior normal.     Assessment and Plan :   PDMP not reviewed this encounter.  1. Screen for STD (sexually transmitted disease)    STI results pending, will treat as appropriate based off of positive results.   Adolph Hoop, PA-C 10/23/23 1003

## 2023-10-24 ENCOUNTER — Ambulatory Visit (HOSPITAL_COMMUNITY): Payer: Self-pay

## 2023-10-24 LAB — CERVICOVAGINAL ANCILLARY ONLY
Chlamydia: NEGATIVE
Comment: NEGATIVE
Comment: NEGATIVE
Comment: NORMAL
Neisseria Gonorrhea: NEGATIVE
Trichomonas: POSITIVE — AB

## 2023-10-24 LAB — RPR: RPR Ser Ql: NONREACTIVE

## 2023-10-24 LAB — HIV ANTIBODY (ROUTINE TESTING W REFLEX): HIV Screen 4th Generation wRfx: NONREACTIVE

## 2023-10-24 MED ORDER — METRONIDAZOLE 500 MG PO TABS: 500.0000 mg | ORAL_TABLET | Freq: Two times a day (BID) | ORAL | 0 refills | Status: DC

## 2023-10-31 ENCOUNTER — Ambulatory Visit: Admitting: Internal Medicine

## 2023-11-05 ENCOUNTER — Ambulatory Visit: Admitting: Internal Medicine

## 2023-11-05 ENCOUNTER — Encounter: Admitting: Internal Medicine

## 2023-11-07 ENCOUNTER — Encounter

## 2023-11-07 ENCOUNTER — Ambulatory Visit: Admitting: Primary Care

## 2023-11-21 DIAGNOSIS — F411 Generalized anxiety disorder: Secondary | ICD-10-CM | POA: Diagnosis not present

## 2023-12-05 DIAGNOSIS — F411 Generalized anxiety disorder: Secondary | ICD-10-CM | POA: Diagnosis not present

## 2023-12-18 DIAGNOSIS — F411 Generalized anxiety disorder: Secondary | ICD-10-CM | POA: Diagnosis not present

## 2023-12-27 DIAGNOSIS — F411 Generalized anxiety disorder: Secondary | ICD-10-CM | POA: Diagnosis not present

## 2024-01-01 ENCOUNTER — Telehealth

## 2024-01-08 DIAGNOSIS — F411 Generalized anxiety disorder: Secondary | ICD-10-CM | POA: Diagnosis not present

## 2024-01-15 ENCOUNTER — Ambulatory Visit: Admitting: Primary Care

## 2024-01-15 ENCOUNTER — Ambulatory Visit: Admitting: Internal Medicine

## 2024-01-15 ENCOUNTER — Encounter: Payer: Self-pay | Admitting: Primary Care

## 2024-01-15 ENCOUNTER — Telehealth: Payer: Self-pay | Admitting: Primary Care

## 2024-01-15 VITALS — BP 136/74 | HR 68 | Temp 97.5°F | Ht 62.5 in | Wt 205.0 lb

## 2024-01-15 DIAGNOSIS — R053 Chronic cough: Secondary | ICD-10-CM | POA: Diagnosis not present

## 2024-01-15 DIAGNOSIS — J4541 Moderate persistent asthma with (acute) exacerbation: Secondary | ICD-10-CM | POA: Diagnosis not present

## 2024-01-15 DIAGNOSIS — J3089 Other allergic rhinitis: Secondary | ICD-10-CM | POA: Diagnosis not present

## 2024-01-15 DIAGNOSIS — J4550 Severe persistent asthma, uncomplicated: Secondary | ICD-10-CM

## 2024-01-15 DIAGNOSIS — Z23 Encounter for immunization: Secondary | ICD-10-CM

## 2024-01-15 LAB — PULMONARY FUNCTION TEST
DL/VA % pred: 126 %
DL/VA: 5.98 ml/min/mmHg/L
DLCO unc % pred: 110 %
DLCO unc: 23.48 ml/min/mmHg
FEF 25-75 Post: 1.93 L/s
FEF 25-75 Pre: 3.14 L/s
FEF2575-%Change-Post: -38 %
FEF2575-%Pred-Post: 55 %
FEF2575-%Pred-Pre: 89 %
FEV1-%Change-Post: -12 %
FEV1-%Pred-Post: 77 %
FEV1-%Pred-Pre: 87 %
FEV1-Post: 2.4 L
FEV1-Pre: 2.73 L
FEV1FVC-%Change-Post: 2 %
FEV1FVC-%Pred-Pre: 101 %
FEV6-%Change-Post: -14 %
FEV6-%Pred-Post: 75 %
FEV6-%Pred-Pre: 87 %
FEV6-Post: 2.71 L
FEV6-Pre: 3.16 L
FEV6FVC-%Change-Post: 0 %
FEV6FVC-%Pred-Post: 100 %
FEV6FVC-%Pred-Pre: 99 %
FVC-%Change-Post: -14 %
FVC-%Pred-Post: 75 %
FVC-%Pred-Pre: 87 %
FVC-Post: 2.71 L
FVC-Pre: 3.17 L
Post FEV1/FVC ratio: 88 %
Post FEV6/FVC ratio: 100 %
Pre FEV1/FVC ratio: 86 %
Pre FEV6/FVC Ratio: 100 %
RV % pred: 109 %
RV: 1.34 L
TLC % pred: 91 %
TLC: 4.43 L

## 2024-01-15 MED ORDER — PREDNISONE 10 MG PO TABS: ORAL_TABLET | ORAL | 0 refills | Status: AC

## 2024-01-15 MED ORDER — BENZONATATE 200 MG PO CAPS: 200.0000 mg | ORAL_CAPSULE | Freq: Three times a day (TID) | ORAL | 1 refills | Status: DC | PRN

## 2024-01-15 MED ORDER — TRELEGY ELLIPTA 200-62.5-25 MCG/ACT IN AEPB: 1.0000 | INHALATION_SPRAY | Freq: Every day | RESPIRATORY_TRACT | Status: DC

## 2024-01-15 MED ORDER — DOXYCYCLINE HYCLATE 100 MG PO TABS: 100.0000 mg | ORAL_TABLET | Freq: Two times a day (BID) | ORAL | 0 refills | Status: DC

## 2024-01-15 NOTE — Progress Notes (Signed)
 Full pft performed today

## 2024-01-15 NOTE — Progress Notes (Signed)
 @Patient  ID: Martha Hall, female    DOB: Oct 23, 1995, 29 y.o.   MRN: [id number]  Chief Complaint  Patient presents with   Medical Management of Chronic Issues    PFT F/U. Cough- dry and wet mucous- green mucous. Pt complains of the cough worsening when she lays back or lays down     Referring provider: Hope Hall ORN, NP  HPI: 28 yr old female previously followed by Martha Hall and Martha Hall for asthma.   Studies: PFT 05/23/22 >> FEV1 2.73 (87%), FEV1% 88, TLC 3.98 (82%), DLCO 105% PFT 01/15/2024 >> FEV1 2.40 (77%), ratio 88, TLC 4.43 (91%), DLCO 110% IgE 12/01/22 >> 28 CBC 12/01/22 >> Eos 0  01/15/2024 Discussed the use of AI scribe software for clinical note transcription with the patient, who gave verbal consent to proceed.  History of Present Illness Martha Hall is a 28 year old female with asthma who presents with persistent cough and asthma exacerbation.  She has been experiencing a persistent cough for three weeks, producing yellow and green mucus. The cough is severe, causing difficulty breathing, especially at night, and has led to an asthma attack characterized by an inability to catch her breath. The cough worsens when she is hot or after prolonged coughing spells.  She has a history of asthma and is currently using Trelegy and montelukast  (Singulair ) as prescribed. While these medications have provided some improvement, she continues to experience symptoms. She has previously received a Depo Medrol  shot, which she found helpful, but has not received steroid shots recently. She recalls improvement with past steroid treatments.  Her past workup includes a chest x-ray and an EKG. She underwent a sleep study with an average oxygen level of 96% and some oxygen drops. She has allergies to dust mites and uses an allergy  air filter at home.  No reflux symptoms. She has a history of taking Pepcid  for 14 days, prescribed by an urgent care, but did not find it helpful for her  symptoms.   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 Per consultation with Martha Hall, no further work-up necessary. Per MFM:  The patient was reassured that the anti-Lewis antibodies are usually of the IgM subtype and therefore we will not cross the placenta and cause fetal anemia.  The Lewis antibodies should therefore not affect the management of her current pregnancy.   PTSD (post-traumatic stress disorder)    Vaginal trichomoniasis 05/09/2019   04/30/2019 on Pap [] tx [] TOC- neg   Vitamin D  deficiency     Tobacco History: Social History   Tobacco Use  Smoking Status Never   Passive exposure: Never  Smokeless Tobacco Never   Counseling given: Not Answered   Outpatient Medications Prior to Visit  Medication Sig Dispense Refill   albuterol  (PROVENTIL ) (2.5 MG/3ML) 0.083% nebulizer solution Take 3 mLs (2.5 mg total) by nebulization every 6 (six) hours as needed for  wheezing or shortness of breath. 150 mL 1   albuterol  (VENTOLIN  HFA) 108 (90 Base) MCG/ACT inhaler Inhale 2 puffs into the lungs every 6 (six) hours as needed for wheezing or shortness of breath. 8 g 2   azelastine  (ASTELIN ) 0.1 % nasal spray Place 1 spray into both nostrils 2 (two) times daily. Use in each nostril as directed 30 mL 5   fluticasone -salmeterol (ADVAIR HFA) 230-21 MCG/ACT inhaler Inhale 2  puffs into the lungs 2 (two) times daily. 1 each 5   Fluticasone -Umeclidin-Vilant (TRELEGY ELLIPTA ) 200-62.5-25 MCG/ACT AEPB Inhale 1 puff into the lungs daily.     levocetirizine (XYZAL ) 5 MG tablet Take 1 tablet (5 mg total) by mouth every evening. 30 tablet 5   metroNIDAZOLE  (FLAGYL ) 500 MG tablet Take 1 tablet (500 mg total) by mouth 2 (two) times daily. 14 tablet 0   montelukast  (SINGULAIR ) 10 MG tablet Take 1 tablet (10 mg total) by mouth at bedtime. 30 tablet 2   phentermine 15 MG capsule Take 15 mg by mouth 2 (two) times daily.     Spacer/Aero-Holding Chambers (AEROCHAMBER MV) inhaler Use as instructed 1 each 0   Tiotropium Bromide Monohydrate  (SPIRIVA  RESPIMAT) 1.25 MCG/ACT AERS Inhale 2 puffs into the lungs daily. 4 g 5   topiramate (TOPAMAX) 25 MG tablet Take 25 mg by mouth daily.     predniSONE  (DELTASONE ) 10 MG tablet 4 tabs for 3 days, then 3 tabs for 3 days, 2 tabs for 3 days, then 1 tab for 3 days, then stop 30 tablet 0   famotidine  (PEPCID ) 40 MG tablet Take 1 tablet (40 mg total) by mouth daily for 14 days. 14 tablet 0   glycopyrrolate (ROBINUL) 1 MG tablet Take 1 mg by mouth 3 (three) times daily.     ondansetron  (ZOFRAN -ODT) 4 MG disintegrating tablet Take 1 tablet (4 mg total) by mouth every 8 (eight) hours as needed for nausea or vomiting. 20 tablet 0   prazosin  (MINIPRESS ) 1 MG capsule Take 1 capsule (1 mg total) by mouth at bedtime. 30 capsule 3   Facility-Administered Medications Prior to Visit  Medication Dose Route Frequency Provider Last Rate Last Admin   albuterol  (PROVENTIL ) (2.5 MG/3ML) 0.083% nebulizer solution 2.5 mg  2.5 mg Nebulization Q6H PRN Jude Harden GAILS, MD          Review of Systems  Review of Systems   Physical Exam  BP 136/74   Pulse 68   Temp (!) 97.5 F (36.4 C)   Ht 5' 2.5 (1.588 m)   Wt 205 lb (93 kg)   SpO2 99% Comment: RA  BMI 36.90 kg/m  Physical Exam Constitutional:      Appearance: Normal appearance.  HENT:     Head:  Normocephalic and atraumatic.  Cardiovascular:     Rate and Rhythm: Normal rate and regular rhythm.  Pulmonary:     Effort: Pulmonary effort is normal.     Breath sounds: Normal breath sounds. No wheezing or rhonchi.  Skin:    General: Skin is warm and dry.  Neurological:     General: No focal deficit present.     Mental Status: She is alert and oriented to person, place, and time. Mental status is at baseline.  Psychiatric:        Mood and Affect: Mood normal.        Behavior: Behavior normal.        Thought Content: Thought content normal.        Judgment:  Judgment normal.     Lab Results:  CBC    Component Value Date/Time   WBC 5.2 07/16/2023 0943   RBC 4.19 07/16/2023 0943   HGB 12.0 07/16/2023 0943   HGB 13.4 12/01/2022 1428   HCT 37.2 07/16/2023 0943   HCT 42.2 12/01/2022 1428   PLT 315.0 07/16/2023 0943   PLT 265 12/01/2022 1428   MCV 88.7 07/16/2023 0943   MCV 86 12/01/2022 1428   MCH 27.2 12/01/2022 1428   MCH 27.3 09/26/2022 2220   MCHC 32.2 07/16/2023 0943   RDW 14.3 07/16/2023 0943   RDW 13.1 12/01/2022 1428   LYMPHSABS 1.6 07/16/2023 0943   LYMPHSABS 2.6 12/01/2022 1428   MONOABS 0.3 07/16/2023 0943   EOSABS 0.0 07/16/2023 0943   EOSABS 0.0 12/01/2022 1428   BASOSABS 0.0 07/16/2023 0943   BASOSABS 0.0 12/01/2022 1428    BMET    Component Value Date/Time   NA 138 09/26/2022 2220   NA 140 07/05/2022 1451   K 3.8 09/26/2022 2220   CL 109 09/26/2022 2220   CO2 21 (L) 09/26/2022 2220   GLUCOSE 90 09/26/2022 2220   BUN 10 09/26/2022 2220   BUN 11 07/05/2022 1451   CREATININE 0.88 09/26/2022 2220   CALCIUM  9.3 09/26/2022 2220   GFRNONAA >60 09/26/2022 2220   GFRAA >60 11/16/2019 0950    BNP No results found for: BNP  ProBNP No results found for: PROBNP  Imaging: No results found.   Assessment & Plan:   No problem-specific Assessment & Plan notes found for this encounter.   1. Immunization due (Primary)   Assessment and  Plan Assessment & Plan Chronic cough with severe persistent allergic asthma and recurrent exacerbations Presenting with persistent cough, mucus production (yellow and green sputum), and episodes of dyspnea. Recent asthma attack with nocturnal dyspnea. Previous treatments with Trelegy, montelukast , and Depo Medrol  provided relief. Current symptoms suggest acute bronchitis, with no wheezing on examination. Differential includes asthma-related cough and possible bacterial infection. Previous chest x-ray and EKG were normal. Methylcholine challenge test was incomplete and may need repetition. - Prescribe Doxycycline  and prednisone  taper for acute bronchitis. - Provide sample of Trelegy 200mcg and send prescription. - Prescribe benzonatate  for cough suppression. - Provide sputum sample cup for respiratory culture. - Consider repeating methylcholine challenge test in hospital setting.  Allergic rhinitis due to dust mites Allergic rhinitis due to dust mites with symptoms of nocturnal congestion. Previous IgE levels were normal. She has not yet seen an allergist but plans to reschedule. Discussion of potential allergy  shots or biologics for management. - Follow up with allergist for potential allergy  shots or biologics. - Continue use of allergy  air filter  - Continue montelukast , Xyzal , and Astelin .   Hall LELON Ferrari, NP 01/15/2024

## 2024-01-15 NOTE — Telephone Encounter (Signed)
 Left voicemail with RT at hospital to call patient to schedule methacholine  challenge. Front please follow up on Friday if not scheduled.

## 2024-01-15 NOTE — Patient Instructions (Addendum)
  VISIT SUMMARY: During today's visit, we discussed your persistent cough and asthma exacerbation. You have been experiencing a severe cough with mucus production for three weeks, which has led to difficulty breathing and a recent asthma attack. We reviewed your current medications and past treatments, and we have made some adjustments to help manage your symptoms.  YOUR PLAN: -ASTHMA WITH ACUTE BRONCHITIS: You have an asthma exacerbation with acute bronchitis, which means your asthma symptoms have worsened due to an infection in your airways. We are prescribing Doxycline to treat the bronchitis and a prednisone  taper to reduce inflammation. You will also receive a sample of Trelegy and a prescription for it. Additionally, we are prescribing benzonatate  to help suppress your cough. Please provide a sputum sample for bacterial culture, and we may need to repeat the methylcholine challenge test in a hospital setting. Continue taking your current medications, including montelukast , Xyzal , and Astelin .  -ALLERGIC RHINITIS DUE TO DUST MITES: You have allergic rhinitis, which means your nasal passages are inflamed due to an allergy  to dust mites. This is causing nocturnal congestion. We discussed the possibility of allergy  shots or biologics for better management. Please follow up with an allergist to explore these options. Continue using your allergy  air filter and consider getting an air purifier.  INSTRUCTIONS: Please follow up with an allergist to discuss potential allergy  shots or biologics. Provide a sputum sample for bacterial culture as instructed. Continue taking your current medications and follow the new prescriptions provided. Consider repeating the methylcholine challenge test in a hospital setting if symptoms persist.  Follow-up 2-3 months with any new pulmonary MD / new patient-30 mins asthma

## 2024-01-15 NOTE — Patient Instructions (Signed)
 Full pft performed today

## 2024-01-15 NOTE — Telephone Encounter (Signed)
 Patient needs methacholine  test done at hospital

## 2024-01-18 ENCOUNTER — Ambulatory Visit (HOSPITAL_COMMUNITY)
Admission: RE | Admit: 2024-01-18 | Discharge: 2024-01-18 | Disposition: A | Discharge status: Home / Self Care | Source: Ambulatory Visit | Attending: Primary Care | Admitting: Primary Care

## 2024-01-18 DIAGNOSIS — J4541 Moderate persistent asthma with (acute) exacerbation: Secondary | ICD-10-CM | POA: Insufficient documentation

## 2024-01-18 LAB — PULMONARY FUNCTION TEST
FEF 25-75 Post: 1.75 L/s
FEF 25-75 Pre: 3.46 L/s
FEF2575-%Change-Post: -49 %
FEF2575-%Pred-Post: 51 %
FEF2575-%Pred-Pre: 103 %
FEV1-%Change-Post: -19 %
FEV1-%Pred-Post: 82 %
FEV1-%Pred-Pre: 102 %
FEV1-Post: 2.36 L
FEV1-Pre: 2.95 L
FEV1FVC-%Change-Post: -16 %
FEV1FVC-%Pred-Pre: 102 %
FEV6-%Change-Post: -4 %
FEV6-%Pred-Post: 97 %
FEV6-%Pred-Pre: 101 %
FEV6-Post: 3.22 L
FEV6-Pre: 3.36 L
FEV6FVC-%Pred-Post: 100 %
FEV6FVC-%Pred-Pre: 100 %
FVC-%Change-Post: -4 %
FVC-%Pred-Post: 97 %
FVC-%Pred-Pre: 102 %
FVC-Post: 3.22 L
FVC-Pre: 3.38 L
Post FEV1/FVC ratio: 73 %
Post FEV6/FVC ratio: 100 %
Pre FEV1/FVC ratio: 87 %
Pre FEV6/FVC Ratio: 100 %

## 2024-01-18 MED ORDER — METHACHOLINE 0.25 MG/ML NEB SOLN
3.0000 mL | Freq: Once | RESPIRATORY_TRACT | Status: AC
  Administered 2024-01-18: 0.75 mg via RESPIRATORY_TRACT
  Filled 2024-01-18: qty 3

## 2024-01-18 MED ORDER — METHACHOLINE 4 MG/ML NEB SOLN
3.0000 mL | Freq: Once | RESPIRATORY_TRACT | Status: DC
  Filled 2024-01-18: qty 3

## 2024-01-18 MED ORDER — METHACHOLINE 0.0625 MG/ML NEB SOLN
3.0000 mL | Freq: Once | RESPIRATORY_TRACT | Status: AC
  Administered 2024-01-18: 0.1875 mg via RESPIRATORY_TRACT
  Filled 2024-01-18: qty 3

## 2024-01-18 MED ORDER — METHACHOLINE 16 MG/ML NEB SOLN
3.0000 mL | Freq: Once | RESPIRATORY_TRACT | Status: DC
  Filled 2024-01-18: qty 3

## 2024-01-18 MED ORDER — METHACHOLINE 0 MG/ML NEB SOLN
3.0000 mL | Freq: Once | RESPIRATORY_TRACT | Status: AC
  Administered 2024-01-18: 3 mL via RESPIRATORY_TRACT
  Filled 2024-01-18: qty 3

## 2024-01-18 MED ORDER — ALBUTEROL SULFATE (2.5 MG/3ML) 0.083% IN NEBU
2.5000 mg | INHALATION_SOLUTION | Freq: Once | RESPIRATORY_TRACT | Status: AC
  Administered 2024-01-18: 2.5 mg via RESPIRATORY_TRACT

## 2024-01-18 MED ORDER — METHACHOLINE 1 MG/ML NEB SOLN
3.0000 mL | Freq: Once | RESPIRATORY_TRACT | Status: AC
  Administered 2024-01-18: 3 mg via RESPIRATORY_TRACT
  Filled 2024-01-18: qty 3

## 2024-01-18 NOTE — Progress Notes (Signed)
 RT NOTE:  Patient seen in our office today for methacholine  challenge. Challenge Max was reached and an Albuterol  breathing treatment was administered. Post treatment FVCs had pt not back within baseline. Per standing methacholine  challenge order a second Albuterol  breathing treatment was administered. Hope, NP was made aware through [facility] secure chat. Post second Albuterol  treatment again the patient was not within baseline parameters. RT called the ordering doctors office and spoke with Thersia, RN and was told to take the patient to the ER for further monitoring. Myself and another RT escorted patient to the ER. At the check in counter we explained why she needed to be seen and an RN at check in stated well it's going to be a long wait. To which the patient stated I do not want to stay here waiting for hours and not be seen, I just want to go home. We brought patient back up to our department for monitoring with us . I reached back out to Hope, NP through the [facility] secure chat to update her. She messaged back stating; I sent her in prednisone  and doxycycline  earlier this week. Continue those medications. If she is symptomatic she should go to UC or ED. I'm going to be away from my computer for 1 hour. Call the office if you need to speak with triage. You can document my response in a telephone note. Drawbrdige tends to have less of a wait time or UC and she could get a depo-medrol  shot. Patient was given these instructions. RT attempted one more additional FVC maneuver with patient before patient going home to which the patient came back to within baseline parameters. Patient was not in distress, no increased work of breathing noted, no wheezing present and patient was able to speak in full sentences with no complications when she left our department.

## 2024-01-21 ENCOUNTER — Ambulatory Visit

## 2024-01-23 MED ORDER — MONTELUKAST SODIUM 10 MG PO TABS: 10.0000 mg | ORAL_TABLET | Freq: Every day | ORAL | 2 refills | Status: AC

## 2024-01-23 MED ORDER — LEVOCETIRIZINE DIHYDROCHLORIDE 5 MG PO TABS: 5.0000 mg | ORAL_TABLET | Freq: Every evening | ORAL | 5 refills | Status: AC

## 2024-01-31 ENCOUNTER — Other Ambulatory Visit: Payer: Self-pay

## 2024-01-31 ENCOUNTER — Encounter: Payer: Self-pay | Admitting: Allergy & Immunology

## 2024-01-31 ENCOUNTER — Ambulatory Visit (INDEPENDENT_AMBULATORY_CARE_PROVIDER_SITE_OTHER): Admitting: Allergy & Immunology

## 2024-01-31 VITALS — BP 120/70 | HR 83 | Temp 98.1°F | Resp 18 | Ht 61.81 in | Wt 202.4 lb

## 2024-01-31 DIAGNOSIS — J310 Chronic rhinitis: Secondary | ICD-10-CM | POA: Diagnosis not present

## 2024-01-31 DIAGNOSIS — J4550 Severe persistent asthma, uncomplicated: Secondary | ICD-10-CM | POA: Diagnosis not present

## 2024-01-31 MED ORDER — YUPELRI 175 MCG/3ML IN SOLN: 175.0000 ug | Freq: Every day | RESPIRATORY_TRACT | 0 refills | Status: DC

## 2024-01-31 MED ORDER — FORMOTEROL FUMARATE 20 MCG/2ML IN NEBU: 20.0000 ug | INHALATION_SOLUTION | Freq: Two times a day (BID) | RESPIRATORY_TRACT | 0 refills | Status: DC

## 2024-01-31 MED ORDER — BUDESONIDE 0.5 MG/2ML IN SUSP: 0.5000 mg | Freq: Two times a day (BID) | RESPIRATORY_TRACT | 0 refills | Status: DC

## 2024-01-31 NOTE — Progress Notes (Unsigned)
 NEW PATIENT  Date of Service/Encounter:  01/31/24  Consult requested by: Martha Greig PARAS, NP   Assessment:   Severe persistent asthma, uncomplicated  Chronic rhinitis - planning for skin testing at the next visit  Plan/Recommendations:   1. Severe persistent asthma, uncomplicated - Lung testing showed a level of 47%, but it did improve with the nebulizer treatment. - We are going to get some labs to look for difficult to control causes of asthma. - I think these are likely to be negative, but it would be good to be thorough.  - We are going to stop the Trelegy for now and replace with three different nebulized medications instead to see if this helps. - This will be more difficult from a compliance standpoint, but maybe the nebulized medications will help with medication delivery.  - We may consider the addition of a biologic like Tezspire (informational handout provided). - This will helped your inhaled medications work better.  - I will talk to your ENT doctor about the possibility of vocal cord dysfunction (also called inducible laryngeal obstruction). - Information on ILO provided below, including treatment regimens.  - Daily controller medication(s): Perforomist  mixed with Pulmicort  0.5mg  twice daily + Yupelri  once daily  - Prior to physical activity: albuterol  2 puffs 10-15 minutes before physical activity. - Rescue medications: albuterol  4 puffs every 4-6 hours as needed and albuterol  nebulizer one vial every 4-6 hours as needed - Asthma control goals:  * Full participation in all desired activities (may need albuterol  before activity) * Albuterol  use two time or less a week on average (not counting use with activity) * Cough interfering with sleep two time or less a month * Oral steroids no more than once a year * No hospitalizations  2. Chronic rhinitis - Because of insurance stipulations, we cannot do skin testing on the same day as your first visit. - We are all  working to fight this, but for now we need to do two separate visits.  - We will know more after we do testing at the next visit.  - The skin testing visit can be squeezed in at your convenience.  - Then we can make a more full plan to address all of your symptoms. - Be sure to stop your antihistamines for 3 days before this appointment.  - Knowing the allergens will help us  to figure out your triggers.   3. Return in about 2 weeks (around 02/14/2024) for SKIN TESTING (IDs except DM). You can have the follow up appointment with Dr. Iva or a Nurse Practicioner (our Nurse Practitioners are excellent and always have Physician oversight!).   This note in its entirety was forwarded to the Provider who requested this consultation.  Subjective:   Martha Hall is a 28 y.o. female presenting today for evaluation of  Chief Complaint  Patient presents with   Allergies    Has been experiencing issues for the past 2 years   Asthma    Change is seasons, or a lot of rain causes symptoms to arise.    Martha Hall has a history of the following: Patient Active Problem List   Diagnosis Date Noted   Acanthosis nigricans 07/05/2022   Hyperhidrosis 07/04/2022   Moderate episode of recurrent major depressive disorder (HCC) 12/15/2021   PTSD (post-traumatic stress disorder) 12/15/2021   GAD (generalized anxiety disorder) 12/15/2021   Prediabetes 06/07/2021   Hospital discharge follow-up 02/04/2021   Choledocholithiasis    Cholelithiasis 01/25/2021   History of  hydrocephalus 06/11/2019   History of blood clot in brain 05/28/2019   Alpha thalassemia silent carrier 05/12/2019   Morbid obesity (HCC) 04/30/2019    History obtained from: chart review and patient.  Discussed the use of AI scribe software for clinical note transcription with the patient and/or guardian, who gave verbal consent to proceed.  Martha Hall was referred by Martha Greig PARAS, NP.     Martha Hall is a 28 y.o.  female presenting for an evaluation of asthma.  Asthma/Respiratory Symptom History: She had asthma as a child, but she grew out of it. Around October 2023, she contracted a URI and she was having a hard time breathing. She went to see her PCP where she had some testing done. She was referred to Pulmonology at that time. She has a history of asthma since childhood, with a significant exacerbation two years ago following a respiratory infection. Her symptoms include persistent shortness of breath and chest tightness. She has been on various medications including Advair, Spiriva , and currently Trelegy, along with Flucast and levocetirizine for allergies. Prednisone  provides only temporary relief, and she continues to experience shortness of breath and fatigue. She uses a nebulizer frequently and has tried different inhalers.  She had a sleep study done which was negative. She had a methacholine  challenge on September 12th.     She describes a persistent left-sided chest pain that does not improve with ibuprofen  and Tylenol . She describes a persistent left-sided chest pain that does not improve with ibuprofen , Tylenol , or antibiotics. She underwent a methacholine  challenge test, which she found terrifying and was unable to complete due to severe symptoms.  Allergic Rhinitis Symptom History: She has a history of allergy  to dust and dust mites, confirmed by blood tests conducted in March. Her social history includes living in an apartment in [place], where she frequently cleans to manage her allergies. She works in [employer] and has been employed there for eight years, noting that her symptoms have worsened over time.  She has a cyst in her nose, identified by a camera examination in March of the previous year, which causes discomfort and congestion, particularly in the mornings.   Otherwise, there is no history of other atopic diseases, including food allergies, drug allergies, stinging insect  allergies, or contact dermatitis. There is no significant infectious history. Vaccinations are up to date.    Past Medical History: Patient Active Problem List   Diagnosis Date Noted   Acanthosis nigricans 07/05/2022   Hyperhidrosis 07/04/2022   Moderate episode of recurrent major depressive disorder (HCC) 12/15/2021   PTSD (post-traumatic stress disorder) 12/15/2021   GAD (generalized anxiety disorder) 12/15/2021   Prediabetes 06/07/2021   Hospital discharge follow-up 02/04/2021   Choledocholithiasis    Cholelithiasis 01/25/2021   History of hydrocephalus 06/11/2019   History of blood clot in brain 05/28/2019   Alpha thalassemia silent carrier 05/12/2019   Morbid obesity (HCC) 04/30/2019    Medication List:  Allergies as of 01/31/2024       Reactions   Metoclopramide  Hives, Itching   Ibuprofen -diphenhydramine  Cit Swelling        Medication List        Accurate as of January 31, 2024  6:19 PM. If you have any questions, ask your nurse or doctor.          STOP taking these medications    azelastine  0.1 % nasal spray Commonly known as: ASTELIN  Stopped by: Marty Morton Shaggy   benzonatate  200 MG capsule Commonly known  as: TESSALON  Stopped by: Marty Morton Shaggy   famotidine  40 MG tablet Commonly known as: PEPCID  Stopped by: Marty Morton Shaggy   fluticasone -salmeterol 230-21 MCG/ACT inhaler Commonly known as: Advair HFA Stopped by: Marty Morton Shaggy   glycopyrrolate 1 MG tablet Commonly known as: ROBINUL Stopped by: Giovanni Biby Louis Treasa Bradshaw   metroNIDAZOLE  500 MG tablet Commonly known as: FLAGYL  Stopped by: Marty Morton Shaggy   ondansetron  4 MG disintegrating tablet Commonly known as: ZOFRAN -ODT Stopped by: Marty Morton Shaggy   phentermine 15 MG capsule Stopped by: Marty Morton Shaggy   prazosin  1 MG capsule Commonly known as: MINIPRESS  Stopped by: Yaremi Stahlman Louis Michelina Mexicano   Spiriva  Respimat 1.25 MCG/ACT Aers Generic drug: Tiotropium  Bromide Monohydrate Stopped by: Marty Morton Shaggy   topiramate 25 MG tablet Commonly known as: TOPAMAX Stopped by: Danyel Griess Louis Jacqueleen Pulver   Trelegy Ellipta  200-62.5-25 MCG/ACT Aepb Generic drug: Fluticasone -Umeclidin-Vilant Stopped by: Marty Morton Shaggy       TAKE these medications    AeroChamber MV inhaler Use as instructed   albuterol  108 (90 Base) MCG/ACT inhaler Commonly known as: VENTOLIN  HFA Inhale 2 puffs into the lungs every 6 (six) hours as needed for wheezing or shortness of breath.   albuterol  (2.5 MG/3ML) 0.083% nebulizer solution Commonly known as: PROVENTIL  Take 3 mLs (2.5 mg total) by nebulization every 6 (six) hours as needed for wheezing or shortness of breath.   budesonide  0.5 MG/2ML nebulizer solution Commonly known as: Pulmicort  Take 2 mLs (0.5 mg total) by nebulization 2 (two) times daily. Started by: Marty Morton Shaggy   doxycycline  100 MG tablet Commonly known as: VIBRA -TABS Take 1 tablet (100 mg total) by mouth 2 (two) times daily.   formoterol  20 MCG/2ML nebulizer solution Commonly known as: Perforomist  Take 2 mLs (20 mcg total) by nebulization 2 (two) times daily. Started by: Marty Morton Shaggy   levocetirizine 5 MG tablet Commonly known as: XYZAL  Take 1 tablet (5 mg total) by mouth every evening.   montelukast  10 MG tablet Commonly known as: SINGULAIR  Take 1 tablet (10 mg total) by mouth at bedtime.   Yupelri  175 MCG/3ML nebulizer solution Generic drug: revefenacin  Take 3 mLs (175 mcg total) by nebulization daily. Started by: Marty Morton Shaggy        Birth History: non-contributory  Developmental History: non-contributory  Past Surgical History: Past Surgical History:  Procedure Laterality Date   BRAIN SURGERY  11-May-1995   stint placed when born   CHOLECYSTECTOMY N/A 01/25/2021   Procedure: LAPAROSCOPIC CHOLECYSTECTOMY WITH ATTEMPTED CHOLANGIOGRAM;  Surgeon: Vernetta Berg, MD;  Location: [facility] OR;  Service:  General;  Laterality: N/A;   ERCP N/A 01/29/2021   Procedure: ENDOSCOPIC RETROGRADE CHOLANGIOPANCREATOGRAPHY (ERCP);  Surgeon: Avram Lupita BRAVO, MD;  Location: [facility] ENDOSCOPY;  Service: Endoscopy;  Laterality: N/A;   IR RADIOLOGIST EVAL & MGMT  03/23/2021   REMOVAL OF STONES  01/29/2021   Procedure: REMOVAL OF STONES;  Surgeon: Avram Lupita BRAVO, MD;  Location: [facility] ENDOSCOPY;  Service: Endoscopy;;   SPHINCTEROTOMY  01/29/2021   Procedure: ANNETT;  Surgeon: Avram Lupita BRAVO, MD;  Location[facility] END[facility]COPY;  Service: Endoscopy;;     Family History: Family History  Problem Relation Age of Onset   Healthy Mother    Healthy Father      Social History: Velvet lives at home with her family. There is no smoking. There are no animals in the home.    Review of systems otherwise negative other than that mentioned in the HPI.    Objective:  Blood pressure 120/70, pulse 83, temperature 98.1 F (36.7 C), temperature source Temporal, resp. rate 18, height 5' 1.81 (1.57 m), weight 202 lb 6.4 oz (91.8 kg), SpO2 98%. Body mass index is 37.25 kg/m.     Physical Exam Vitals reviewed.  Constitutional:      Appearance: She is well-developed.  HENT:     Head: Normocephalic and atraumatic.     Right Ear: Tympanic membrane, ear canal and external ear normal. No drainage, swelling or tenderness. Tympanic membrane is not injected, scarred, erythematous, retracted or bulging.     Left Ear: Tympanic membrane, ear canal and external ear normal. No drainage, swelling or tenderness. Tympanic membrane is not injected, scarred, erythematous, retracted or bulging.     Nose: No nasal deformity, septal deviation, mucosal edema or rhinorrhea.     Right Turbinates: Enlarged, swollen and pale.     Left Turbinates: Enlarged, swollen and pale.     Right Sinus: No maxillary sinus tenderness or frontal sinus tenderness.     Left Sinus: No maxillary sinus tenderness or frontal sinus tenderness.     Mouth/Throat:      Mouth: Mucous membranes are not pale and not dry.     Pharynx: Uvula midline.  Eyes:     General:        Right eye: No discharge.        Left eye: No discharge.     Conjunctiva/sclera: Conjunctivae normal.     Right eye: Right conjunctiva is not injected. No chemosis.    Left eye: Left conjunctiva is not injected. No chemosis.    Pupils: Pupils are equal, round, and reactive to light.  Cardiovascular:     Rate and Rhythm: Normal rate and regular rhythm.     Heart sounds: Normal heart sounds.  Pulmonary:     Effort: Pulmonary effort is normal. No tachypnea, accessory muscle usage or respiratory distress.     Breath sounds: Normal breath sounds. No wheezing, rhonchi or rales.     Comments: No wheezes or crackles noted.  Chest:     Chest wall: No tenderness.  Abdominal:     Tenderness: There is no abdominal tenderness. There is no guarding or rebound.  Lymphadenopathy:     Head:     Right side of head: No submandibular, tonsillar or occipital adenopathy.     Left side of head: No submandibular, tonsillar or occipital adenopathy.     Cervical: No cervical adenopathy.  Skin:    Coloration: Skin is not pale.     Findings: No abrasion, erythema, petechiae or rash. Rash is not papular, urticarial or vesicular.  Neurological:     Mental Status: She is alert.  Psychiatric:        Behavior: Behavior is cooperative.       Diagnostic studies:    Spirometry: results abnormal (FEV1: 1.24/47%, FVC: 2.54/84%, FEV1/FVC: 49%).    Spirometry consistent with moderate obstructive disease. Albuterol  nebulizer treatment given in clinic with significant improvement in FEV1 and FVC per ATS criteria.  Allergy  Studies: deferred due to insurance stipulations that require a separate visit for testing          Marty Shaggy, MD [facility] 

## 2024-01-31 NOTE — Patient Instructions (Addendum)
 1. Severe persistent asthma, uncomplicated - Lung testing showed a level of 47%, but it did improve with the nebulizer treatment. - We are going to get some labs to look for difficult to control causes of asthma. - I think these are likely to be negative, but it would be good to be thorough.  - We are going to stop the Trelegy for now and replace with three different nebulized medications instead to see if this helps. - This will be more difficult from a compliance standpoint, but maybe the nebulized medications will help with medication delivery.  - We may consider the addition of a biologic like Tezspire (informational handout provided). - This will helped your inhaled medications work better.  - I will talk to your ENT doctor about the possibility of vocal cord dysfunction (also called inducible laryngeal obstruction). - Information on ILO provided below, including treatment regimens.  - Daily controller medication(s): Perforomist  mixed with Pulmicort  0.5mg  twice daily + Yupelri  once daily  - Prior to physical activity: albuterol  2 puffs 10-15 minutes before physical activity. - Rescue medications: albuterol  4 puffs every 4-6 hours as needed and albuterol  nebulizer one vial every 4-6 hours as needed - Asthma control goals:  * Full participation in all desired activities (may need albuterol  before activity) * Albuterol  use two time or less a week on average (not counting use with activity) * Cough interfering with sleep two time or less a month * Oral steroids no more than once a year * No hospitalizations  2. Chronic rhinitis - Because of insurance stipulations, we cannot do skin testing on the same day as your first visit. - We are all working to fight this, but for now we need to do two separate visits.  - We will know more after we do testing at the next visit.  - The skin testing visit can be squeezed in at your convenience.  - Then we can make a more full plan to address all of your  symptoms. - Be sure to stop your antihistamines for 3 days before this appointment.  - Knowing the allergens will help us  to figure out your triggers.   3. Return in about 2 weeks (around 02/14/2024) for SKIN TESTING (IDs except DM). You can have the follow up appointment with Dr. Iva or a Nurse Practicioner (our Nurse Practitioners are excellent and always have Physician oversight!).    Please inform us  of any Emergency Department visits, hospitalizations, or changes in symptoms. Call us  before going to the ED for breathing or allergy  symptoms since we might be able to fit you in for a sick visit. Feel free to contact us  anytime with any questions, problems, or concerns.  It was a pleasure to meet you today!  Websites that have reliable patient information: 1. American Academy of Asthma, Allergy , and Immunology: www.aaaai.org 2. Food Allergy  Research and Education (FARE): foodallergy.org 3. Mothers of Asthmatics: http://www.asthmacommunitynetwork.org 4. American College of Allergy , Asthma, and Immunology: www.acaai.org      "Like" us  on Facebook and Instagram for our latest updates!      A healthy democracy works best when [employer] participate! Make sure you are registered to vote! If you have moved or changed any of your contact information, you will need to get this updated before voting! Scan the QR codes below to learn more!

## 2024-02-01 ENCOUNTER — Telehealth: Payer: Self-pay

## 2024-02-01 ENCOUNTER — Other Ambulatory Visit (HOSPITAL_COMMUNITY): Payer: Self-pay

## 2024-02-01 ENCOUNTER — Encounter: Payer: Self-pay | Admitting: Allergy & Immunology

## 2024-02-01 NOTE — Telephone Encounter (Signed)
*  AA  Pharmacy Patient Advocate Encounter   Received notification from CoverMyMeds that prior authorization for Yupelri  175MCG/3ML solution  is required/requested.   Insurance verification completed.   The patient is insured through [facility] .   Per test claim: PA required; PA submitted to above mentioned insurance via Latent Key/confirmation #/EOC [id number] Status is pending

## 2024-02-01 NOTE — Telephone Encounter (Signed)
*  AA  Pharmacy Patient Advocate Encounter   Received notification from CoverMyMeds that prior authorization for Formoterol  neb sol. is required/requested.   Insurance verification completed.   The patient is insured through [facility] .   Per test claim: PA required; PA submitted to above mentioned insurance via Latent Key/confirmation #/EOC [id number] Status is pending

## 2024-02-01 NOTE — Telephone Encounter (Signed)
 Your request has been approved Request Reference Number: [id number]. FORMOTEROL  NEB 20/2ML is approved through 01/31/2025. For further questions, call [employer] at [phone]. Authorization Expiration09/26/2026

## 2024-02-01 NOTE — Telephone Encounter (Signed)
 PA denied due to:   Per your health plan's criteria, this drug is covered if you meet the following: You have a long term lung condition that makes it difficult to breathe (chronic obstructive pulmonary disease [COPD] including chronic bronchitis and emphysema). The information provided does not show that you meet the criteria listed above.

## 2024-02-06 NOTE — Telephone Encounter (Signed)
 OK no problemo.   Marty Shaggy, MD [facility] 

## 2024-02-07 DIAGNOSIS — F411 Generalized anxiety disorder: Secondary | ICD-10-CM | POA: Diagnosis not present

## 2024-02-15 ENCOUNTER — Other Ambulatory Visit: Payer: Self-pay | Admitting: Medical Genetics

## 2024-02-15 DIAGNOSIS — Z006 Encounter for examination for normal comparison and control in clinical research program: Secondary | ICD-10-CM

## 2024-02-23 ENCOUNTER — Encounter: Payer: Self-pay | Admitting: Allergy & Immunology

## 2024-02-25 ENCOUNTER — Ambulatory Visit

## 2024-02-26 MED ORDER — SPIRIVA RESPIMAT 1.25 MCG/ACT IN AERS: 2.0000 | INHALATION_SPRAY | Freq: Every day | RESPIRATORY_TRACT | 5 refills | Status: DC

## 2024-02-26 NOTE — Addendum Note (Signed)
 Addended by: IVA MARTY SALTNESS on: 02/26/2024 04:08 PM   Modules accepted: Orders

## 2024-02-28 DIAGNOSIS — F411 Generalized anxiety disorder: Secondary | ICD-10-CM | POA: Diagnosis not present

## 2024-03-17 ENCOUNTER — Encounter

## 2024-03-19 ENCOUNTER — Encounter

## 2024-03-21 ENCOUNTER — Ambulatory Visit (INDEPENDENT_AMBULATORY_CARE_PROVIDER_SITE_OTHER)

## 2024-03-21 ENCOUNTER — Telehealth: Payer: Self-pay

## 2024-03-21 VITALS — BP 118/74 | HR 63 | Temp 98.0°F | Ht 61.0 in | Wt 208.4 lb

## 2024-03-21 DIAGNOSIS — J4541 Moderate persistent asthma with (acute) exacerbation: Secondary | ICD-10-CM | POA: Diagnosis not present

## 2024-03-21 MED ORDER — AIRSUPRA 90-80 MCG/ACT IN AERO: 1.0000 | INHALATION_SPRAY | RESPIRATORY_TRACT | 12 refills | Status: AC | PRN

## 2024-03-21 MED ORDER — BREZTRI AEROSPHERE 160-9-4.8 MCG/ACT IN AERO: 2.0000 | INHALATION_SPRAY | Freq: Two times a day (BID) | RESPIRATORY_TRACT | 12 refills | Status: DC

## 2024-03-21 MED ORDER — BREZTRI AEROSPHERE 160-9-4.8 MCG/ACT IN AERO: 2.0000 | INHALATION_SPRAY | Freq: Two times a day (BID) | RESPIRATORY_TRACT | Status: DC

## 2024-03-21 NOTE — Telephone Encounter (Signed)
*  Pulm  Pharmacy Patient Advocate Encounter   Received notification from Fax that prior authorization for Airsupra 90-80MCG/ACT aerosol   is required/requested.   Insurance verification completed.   The patient is insured through [facility].   Per test claim:  Trial of two preferred alternatives is preferred by the insurance.  If suggested medication is appropriate, Please send in a new RX and discontinue this one. If not, please advise as to why it's not appropriate so that we may request a Prior Authorization. Please note, some preferred medications may still require a PA.  If the suggested medications have not been trialed and there are no contraindications to their use, the PA will not be submitted, as it will not be approved.   CMM Key: [id number]  *preferred alternatives are: Brand Symbicort Brand Advair Diskus Brand Advair HFA Dulera

## 2024-03-21 NOTE — Telephone Encounter (Signed)
 Please advise Dr. Zaida

## 2024-03-21 NOTE — Patient Instructions (Signed)
  VISIT SUMMARY: Today, we discussed your ongoing breathing issues and cough, which have been persistent since 2023. You have been experiencing shortness of breath, especially after a sinus infection, and have had episodes of coughing up green mucus. We reviewed your current treatments and made some adjustments to better manage your symptoms.  YOUR PLAN: -MODERATE PERSISTENT ASTHMA WITH ACUTE EXACERBATION: Moderate persistent asthma means you have frequent asthma symptoms that can affect your daily activities. We have prescribed Breztri inhaler to use twice daily and Airsupra as a rescue inhaler to use every four hours as needed. Please remember to wash your mouth after using the inhalers to prevent infections. We also ordered a test to rule out other lung diseases and scheduled a follow-up in one month to see how you are doing.  -CHRONIC COUGH WITH SPUTUM PRODUCTION AND CHEST PAIN SECONDARY TO COUGH: Your chronic cough with green mucus and chest pain is likely due to your asthma and allergies. Continue using ibuprofen  and Tylenol  for pain relief and a heating pad for chest pain. Avoid exposure to dust and smoke to help reduce your symptoms.  -ALLERGIC RHINITIS: Allergic rhinitis is an allergic reaction that causes nasal congestion and sensitivity to dust mites. Continue with environmental control measures like dusting with a mask and using allergy  filters, and avoid exposure to known allergens.  INSTRUCTIONS: Please follow up in one month to assess how well the new treatment plan is working. Make sure to get the alpha-1 antitrypsin test done as ordered.

## 2024-03-21 NOTE — Telephone Encounter (Signed)
 Copied from CRM #[id number]. Topic: Clinical - Medication Prior Auth >> Mar 21, 2024 10:12 AM Isabell A wrote: Reason for CRM: [facility] calling in regard to budesonide -glycopyrrolate-formoterol  (BREZTRI AEROSPHERE) 160-9-4.8 MCG/ACT AERO inhaler [[id number]] & Albuterol -Budesonide  (AIRSUPRA) 90-80 MCG/ACT AERO [[id number]] - both prescriptions require PA - will be sent to Covermymeds as well.  Please advise PA for Breztri and Airsupra.

## 2024-03-21 NOTE — Addendum Note (Signed)
 Addended by: Tahlor Berenguer M on: 03/21/2024 10:08 AM   Modules accepted: Orders
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
## 2024-03-21 NOTE — Telephone Encounter (Signed)
*  Pulm   Pharmacy Patient Advocate Encounter   Received notification from Fax that prior authorization for Breztri Aerosphere 160-9-4.8MCG/ACT aerosol   is required/requested.   Insurance verification completed.   The patient is insured through [facility].   Per test claim: PA required; PA started via CoverMyMeds. KEY [id number] . Please see clinical question(s) below that I am not finding the answer to in their chart and advise.   Patient must try and fail two preferred alternatives:   Brand Symbicort Brand Advair Diskus Brand Advair HFA Dulera

## 2024-03-21 NOTE — Telephone Encounter (Signed)
 A user error has taken place: encounter opened in error, closed for administrative reasons.

## 2024-03-24 LAB — ALPHA-1-ANTITRYPSIN: A-1 Antitrypsin, Ser: 132 mg/dL (ref 83–199)

## 2024-03-24 NOTE — Telephone Encounter (Signed)
 Noted

## 2024-03-24 NOTE — Telephone Encounter (Signed)
 Dr Zaida, please advise

## 2024-03-25 NOTE — Telephone Encounter (Signed)
 I will route to the pharmacy team to verify if Lorraine will be okay to send to pt

## 2024-03-26 LAB — ANCA SCREEN W REFLEX TITER: ANCA SCREEN: NEGATIVE

## 2024-03-31 ENCOUNTER — Other Ambulatory Visit: Payer: Self-pay | Admitting: Allergy & Immunology

## 2024-04-01 NOTE — Telephone Encounter (Signed)
 Please advise

## 2024-04-02 ENCOUNTER — Other Ambulatory Visit: Payer: Self-pay

## 2024-04-02 DIAGNOSIS — Z8709 Personal history of other diseases of the respiratory system: Secondary | ICD-10-CM

## 2024-04-02 MED ORDER — BUDESONIDE-FORMOTEROL FUMARATE 160-4.5 MCG/ACT IN AERO
2.0000 | INHALATION_SPRAY | Freq: Two times a day (BID) | RESPIRATORY_TRACT | 6 refills | Status: AC
Start: 2024-04-02 — End: ?

## 2024-04-02 NOTE — Telephone Encounter (Signed)
 Pt has to fail 2 different medications before she can have Breztri .   Pt has already failed Advair, but she has to try Symbicort  or Dulera and fail either one before insurance will process Breztrri.   We just need to know what RX you would like her to try first.

## 2024-04-02 NOTE — Telephone Encounter (Signed)
 ATC X1. LMTCB or check mychart messages. NFN

## 2024-04-11 ENCOUNTER — Other Ambulatory Visit (HOSPITAL_COMMUNITY): Payer: Self-pay

## 2024-04-15 ENCOUNTER — Telehealth: Payer: Self-pay

## 2024-04-15 ENCOUNTER — Other Ambulatory Visit (HOSPITAL_COMMUNITY): Payer: Self-pay

## 2024-04-15 NOTE — Telephone Encounter (Signed)
*  Pulm  Pharmacy Patient Advocate Encounter   Received notification from Fax that prior authorization for Breyna   is required/requested.   Insurance verification completed.   The patient is insured through [facility] .   Per test claim: Refill too soon. PA is not needed at this time. Medication was filled 11/28. Next eligible fill date is 12/21.  *Brand Symbicort  is preferred and covered

## 2024-04-16 NOTE — Addendum Note (Signed)
 Encounter addended by: Jerone Thersia HERO, RN on: 04/16/2024 3:55 PM  Actions taken: Letter saved

## 2024-04-24 ENCOUNTER — Encounter: Admitting: Family

## 2024-04-24 DIAGNOSIS — F411 Generalized anxiety disorder: Secondary | ICD-10-CM | POA: Diagnosis not present

## 2024-04-28 ENCOUNTER — Ambulatory Visit

## 2024-04-29 ENCOUNTER — Ambulatory Visit (INDEPENDENT_AMBULATORY_CARE_PROVIDER_SITE_OTHER)

## 2024-04-29 VITALS — BP 103/70 | HR 88 | Temp 98.0°F | Ht 61.0 in | Wt 223.0 lb

## 2024-04-29 DIAGNOSIS — J4541 Moderate persistent asthma with (acute) exacerbation: Secondary | ICD-10-CM | POA: Diagnosis not present

## 2024-04-29 DIAGNOSIS — J309 Allergic rhinitis, unspecified: Secondary | ICD-10-CM | POA: Diagnosis not present

## 2024-04-29 DIAGNOSIS — Z8709 Personal history of other diseases of the respiratory system: Secondary | ICD-10-CM

## 2024-04-29 DIAGNOSIS — Z23 Encounter for immunization: Secondary | ICD-10-CM

## 2024-04-29 NOTE — Addendum Note (Signed)
 Addended by: CLAUDENE NEVINS A on: 04/29/2024 11:30 AM   Modules accepted: Orders
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
## 2024-05-09 ENCOUNTER — Telehealth: Admitting: Physician Assistant

## 2024-05-09 DIAGNOSIS — J4541 Moderate persistent asthma with (acute) exacerbation: Secondary | ICD-10-CM | POA: Diagnosis not present

## 2024-05-09 DIAGNOSIS — B9689 Other specified bacterial agents as the cause of diseases classified elsewhere: Secondary | ICD-10-CM

## 2024-05-09 DIAGNOSIS — J208 Acute bronchitis due to other specified organisms: Secondary | ICD-10-CM

## 2024-05-09 MED ORDER — PREDNISONE 20 MG PO TABS: 40.0000 mg | ORAL_TABLET | Freq: Every day | ORAL | 0 refills | Status: AC

## 2024-05-09 MED ORDER — AZITHROMYCIN 250 MG PO TABS: ORAL_TABLET | ORAL | 0 refills | Status: AC

## 2024-05-09 MED ORDER — PROMETHAZINE-DM 6.25-15 MG/5ML PO SYRP: 5.0000 mL | ORAL_SOLUTION | Freq: Four times a day (QID) | ORAL | 0 refills | Status: AC | PRN

## 2024-05-09 NOTE — Progress Notes (Signed)
 " Virtual Visit Consent   Tony Shuffield, you are scheduled for a virtual visit with a [facility] provider today. Just as with appointments in the office, your consent must be obtained to participate. Your consent will be active for this visit and any virtual visit you may have with one of our providers in the next 365 days. If you have a MyChart account, a copy of this consent can be sent to you electronically.  As this is a virtual visit, video technology does not allow for your provider to perform a traditional examination. This may limit your provider's ability to fully assess your condition. If your provider identifies any concerns that need to be evaluated in person or the need to arrange testing (such as labs, EKG, etc.), we will make arrangements to do so. Although advances in technology are sophisticated, we cannot ensure that it will always work on either your end or our end. If the connection with a video visit is poor, the visit may have to be switched to a telephone visit. With either a video or telephone visit, we are not always able to ensure that we have a secure connection.  By engaging in this virtual visit, you consent to the provision of healthcare and authorize for your insurance to be billed (if applicable) for the services provided during this visit. Depending on your insurance coverage, you may receive a charge related to this service.  I need to obtain your verbal consent now. Are you willing to proceed with your visit today? Libra Kofoed has provided verbal consent on 05/09/2024 for a virtual visit (video or telephone). [name], PA-C  Date: 05/09/2024 9:01 AM   Virtual Visit via Video Note   I, [name], connected with  Martha Hall  ([id number], [birth year]) on 05/09/2024 at  8:45 AM EST by a video-enabled telemedicine application and verified that I am speaking with the correct person using two identifiers.  Location: Patient: Virtual Visit Location  Patient: Home Provider: Virtual Visit Location Provider: Home Office   I discussed the limitations of evaluation and management by telemedicine and the availability of in person appointments. The patient expressed understanding and agreed to proceed.    History of Present Illness: Ataya Riddell is a 29 y.o. who identifies as a female who was assigned female at birth, and is being seen today for sore throat and chest congestion.  HPI: URI  This is a new problem. The current episode started in the past 7 days (Wednesday, 05/07/24). The problem has been gradually worsening. There has been no fever (hot and cold flashes). Associated symptoms include coughing, ear pain (left), headaches, a sore throat, swollen glands and wheezing (nighttime). Pertinent negatives include no congestion, diarrhea, nausea, plugged ear sensation, rhinorrhea, sinus pain or vomiting. Associated symptoms comments: chills. She has tried acetaminophen  for the symptoms. The treatment provided no relief.     Problems:  Patient Active Problem List   Diagnosis Date Noted   Acanthosis nigricans 07/05/2022   Hyperhidrosis 07/04/2022   Moderate episode of recurrent major depressive disorder (HCC) 12/15/2021   PTSD (post-traumatic stress disorder) 12/15/2021   GAD (generalized anxiety disorder) 12/15/2021   Prediabetes 06/07/2021   Hospital discharge follow-up 02/04/2021   Choledocholithiasis    Cholelithiasis 01/25/2021   History of hydrocephalus 06/11/2019   History of blood clot in brain 05/28/2019   Alpha thalassemia silent carrier 05/12/2019   Morbid obesity (HCC) 04/30/2019    Allergies: Allergies[1] Medications: Current Medications[2]  Observations/Objective: Patient is  well-developed, well-nourished in no acute distress.  Resting comfortably at home.  Head is normocephalic, atraumatic.  No labored breathing. Speech is clear and coherent with logical content.  Patient is alert and oriented at baseline.     Assessment and Plan: 1. Acute bacterial bronchitis (Primary) - azithromycin  (ZITHROMAX ) 250 MG tablet; Take 2 tablets on day 1, then 1 tablet daily on days 2 through 5  Dispense: 6 tablet; Refill: 0 - predniSONE  (DELTASONE ) 20 MG tablet; Take 2 tablets (40 mg total) by mouth daily with breakfast.  Dispense: 10 tablet; Refill: 0 - promethazine -dextromethorphan (PROMETHAZINE -DM) 6.25-15 MG/5ML syrup; Take 5 mLs by mouth 4 (four) times daily as needed.  Dispense: 118 mL; Refill: 0  2. Moderate persistent asthma with exacerbation - azithromycin  (ZITHROMAX ) 250 MG tablet; Take 2 tablets on day 1, then 1 tablet daily on days 2 through 5  Dispense: 6 tablet; Refill: 0 - predniSONE  (DELTASONE ) 20 MG tablet; Take 2 tablets (40 mg total) by mouth daily with breakfast.  Dispense: 10 tablet; Refill: 0 - promethazine -dextromethorphan (PROMETHAZINE -DM) 6.25-15 MG/5ML syrup; Take 5 mLs by mouth 4 (four) times daily as needed.  Dispense: 118 mL; Refill: 0  - Worsening over a week despite OTC medications - Will treat with Z-pack, Promethazine  DM, and Prednisone  - Can add Mucinex (PLAIN) during the daytime - Tylenol  and/or Ibuprofen  as needed for body aches and fevers - Push fluids.  - Rest.  - Steam and humidifier can help - Seek in person evaluation if worsening or symptoms fail to improve    Follow Up Instructions: I discussed the assessment and treatment plan with the patient. The patient was provided an opportunity to ask questions and all were answered. The patient agreed with the plan and demonstrated an understanding of the instructions.  A copy of instructions were sent to the patient via MyChart unless otherwise noted below.    The patient was advised to call back or seek an in-person evaluation if the symptoms worsen or if the condition fails to improve as anticipated.    Zahid Carneiro M Elridge Stemm, PA-C     [1]  Allergies Allergen Reactions   Metoclopramide  Hives and Itching    Ibuprofen -Diphenhydramine  Cit Swelling  [2]  Current Outpatient Medications:    azithromycin  (ZITHROMAX ) 250 MG tablet, Take 2 tablets on day 1, then 1 tablet daily on days 2 through 5, Disp: 6 tablet, Rfl: 0   predniSONE  (DELTASONE ) 20 MG tablet, Take 2 tablets (40 mg total) by mouth daily with breakfast., Disp: 10 tablet, Rfl: 0   promethazine -dextromethorphan (PROMETHAZINE -DM) 6.25-15 MG/5ML syrup, Take 5 mLs by mouth 4 (four) times daily as needed., Disp: 118 mL, Rfl: 0   albuterol  (PROVENTIL ) (2.5 MG/3ML) 0.083% nebulizer solution, Take 3 mLs (2.5 mg total) by nebulization every 6 (six) hours as needed for wheezing or shortness of breath. (Patient not taking: Reported on 04/29/2024), Disp: 150 mL, Rfl: 1   albuterol  (VENTOLIN  HFA) 108 (90 Base) MCG/ACT inhaler, Inhale 2 puffs into the lungs every 6 (six) hours as needed for wheezing or shortness of breath., Disp: 8 g, Rfl: 2   Albuterol -Budesonide  (AIRSUPRA ) 90-80 MCG/ACT AERO, Inhale 1-2 puffs into the lungs every 4 (four) hours as needed., Disp: 10.7 g, Rfl: 12   budesonide -formoterol  (SYMBICORT ) 160-4.5 MCG/ACT inhaler, Inhale 2 puffs into the lungs 2 (two) times daily. Also use 1-2 Puffs every 6 hours as needed for shortness of breath or wheezing. Don't exceed 8 puffs per day., Disp: 1 each, Rfl: 6   formoterol  (  PERFOROMIST ) 20 MCG/2ML nebulizer solution, USE 1 VIAL VIA NEBULIZER TWICE DAILY, Disp: 360 mL, Rfl: 0   levocetirizine (XYZAL ) 5 MG tablet, Take 1 tablet (5 mg total) by mouth every evening., Disp: 30 tablet, Rfl: 5   montelukast  (SINGULAIR ) 10 MG tablet, Take 1 tablet (10 mg total) by mouth at bedtime., Disp: 30 tablet, Rfl: 2   Spacer/Aero-Holding Chambers (AEROCHAMBER MV) inhaler, Use as instructed, Disp: 1 each, Rfl: 0  "

## 2024-05-09 NOTE — Patient Instructions (Signed)
 " Martha Hall, thank you for joining Martha CHRISTELLA Dickinson, PA-C for today's virtual visit.  While this provider is not your primary care provider (PCP), if your PCP is located in our provider database this encounter information will be shared with them immediately following your visit.   A [facility] MyChart account gives you access to today's visit and all your visits, tests, and labs performed at [facility]  click here if you don't have a [facility] MyChart account or go to [email or web address]  Consent: (Patient) Martha Hall provided verbal consent for this virtual visit at the beginning of the encounter.  Current Medications:  Current Outpatient Medications:    azithromycin  (ZITHROMAX ) 250 MG tablet, Take 2 tablets on day 1, then 1 tablet daily on days 2 through 5, Disp: 6 tablet, Rfl: 0   predniSONE  (DELTASONE ) 20 MG tablet, Take 2 tablets (40 mg total) by mouth daily with breakfast., Disp: 10 tablet, Rfl: 0   promethazine -dextromethorphan (PROMETHAZINE -DM) 6.25-15 MG/5ML syrup, Take 5 mLs by mouth 4 (four) times daily as needed., Disp: 118 mL, Rfl: 0   albuterol  (PROVENTIL ) (2.5 MG/3ML) 0.083% nebulizer solution, Take 3 mLs (2.5 mg total) by nebulization every 6 (six) hours as needed for wheezing or shortness of breath. (Patient not taking: Reported on 04/29/2024), Disp: 150 mL, Rfl: 1   albuterol  (VENTOLIN  HFA) 108 (90 Base) MCG/ACT inhaler, Inhale 2 puffs into the lungs every 6 (six) hours as needed for wheezing or shortness of breath., Disp: 8 g, Rfl: 2   Albuterol -Budesonide  (AIRSUPRA ) 90-80 MCG/ACT AERO, Inhale 1-2 puffs into the lungs every 4 (four) hours as needed., Disp: 10.7 g, Rfl: 12   budesonide -formoterol  (SYMBICORT ) 160-4.5 MCG/ACT inhaler, Inhale 2 puffs into the lungs 2 (two) times daily. Also use 1-2 Puffs every 6 hours as needed for shortness of breath or wheezing. Don't exceed 8 puffs per day., Disp: 1 each, Rfl: 6   formoterol  (PERFOROMIST ) 20  MCG/2ML nebulizer solution, USE 1 VIAL VIA NEBULIZER TWICE DAILY, Disp: 360 mL, Rfl: 0   levocetirizine (XYZAL ) 5 MG tablet, Take 1 tablet (5 mg total) by mouth every evening., Disp: 30 tablet, Rfl: 5   montelukast  (SINGULAIR ) 10 MG tablet, Take 1 tablet (10 mg total) by mouth at bedtime., Disp: 30 tablet, Rfl: 2   Spacer/Aero-Holding Chambers (AEROCHAMBER MV) inhaler, Use as instructed, Disp: 1 each, Rfl: 0   Medications ordered in this encounter:  Meds ordered this encounter  Medications   azithromycin  (ZITHROMAX ) 250 MG tablet    Sig: Take 2 tablets on day 1, then 1 tablet daily on days 2 through 5    Dispense:  6 tablet    Refill:  0    Supervising Provider:   LAMPTEY, PHILIP O [[id number]]   predniSONE  (DELTASONE ) 20 MG tablet    Sig: Take 2 tablets (40 mg total) by mouth daily with breakfast.    Dispense:  10 tablet    Refill:  0    Supervising Provider:   LAMPTEY, PHILIP O [[id number]]   promethazine -dextromethorphan (PROMETHAZINE -DM) 6.25-15 MG/5ML syrup    Sig: Take 5 mLs by mouth 4 (four) times daily as needed.    Dispense:  118 mL    Refill:  0    Supervising Provider:   BLAISE ALEENE KIDD [[id number]]     *If you need refills on other medications prior to your next appointment, please contact your pharmacy*  Follow-Up: Call back or seek an in-person evaluation if the symptoms worsen or if the condition fails  to improve as anticipated.  [facility] [phone]  Other Instructions Acute Bronchitis, Adult  Acute bronchitis is sudden inflammation of the main airways (bronchi) that come off the windpipe (trachea) in the lungs. The swelling causes the airways to get smaller and make more mucus than normal. This can make it hard to breathe and can cause coughing or noisy breathing (wheezing). Acute bronchitis may last several weeks. The cough may last longer. Allergies, asthma, and exposure to smoke may make the condition worse. What are the causes? This condition can  be caused by germs and by substances that irritate the lungs, including: Cold and flu viruses. The most common cause of this condition is the virus that causes the common cold. Bacteria. This is less common. Breathing in substances that irritate the lungs, including: Smoke from cigarettes and other forms of tobacco. Dust and pollen. Fumes from household cleaning products, gases, or burned fuel. Indoor or outdoor air pollution. What increases the risk? The following factors may make you more likely to develop this condition: A weak body's defense system, also called the immune system. A condition that affects your lungs and breathing, such as asthma. What are the signs or symptoms? Common symptoms of this condition include: Coughing. This may bring up clear, yellow, or green mucus from your lungs (sputum). Wheezing. Runny or stuffy nose. Having too much mucus in your lungs (chest congestion). Shortness of breath. Aches and pains, including sore throat or chest. How is this diagnosed? This condition is usually diagnosed based on: Your symptoms and medical history. A physical exam. You may also have other tests, including tests to rule out other conditions, such as pneumonia. These tests include: A test of lung function. Test of a mucus sample to look for the presence of bacteria. Tests to check the oxygen level in your blood. Blood tests. Chest X-ray. How is this treated? Most cases of acute bronchitis clear up over time without treatment. Your health care provider may recommend: Drinking more fluids to help thin your mucus so it is easier to cough up. Taking inhaled medicine (inhaler) to improve air flow in and out of your lungs. Using a vaporizer or a humidifier. These are machines that add water to the air to help you breathe better. Taking a medicine that thins mucus and clears congestion (expectorant). Taking a medicine that prevents or stops coughing (cough suppressant). It  is not common to take an antibiotic medicine for this condition. Follow these instructions at home:  Take over-the-counter and prescription medicines only as told by your health care provider. Use an inhaler, vaporizer, or humidifier as told by your health care provider. Take two teaspoons (10 mL) of honey at bedtime to lessen coughing at night. Drink enough fluid to keep your urine pale yellow. Do not use any products that contain nicotine or tobacco. These products include cigarettes, chewing tobacco, and vaping devices, such as e-cigarettes. If you need help quitting, ask your health care provider. Get plenty of rest. Return to your normal activities as told by your health care provider. Ask your health care provider what activities are safe for you. Keep all follow-up visits. This is important. How is this prevented? To lower your risk of getting this condition again: Wash your hands often with soap and water for at least 20 seconds. If soap and water are not available, use hand sanitizer. Avoid contact with people who have cold symptoms. Try not to touch your mouth, nose, or eyes with your  hands. Avoid breathing in smoke or chemical fumes. Breathing smoke or chemical fumes will make your condition worse. Get the flu shot every year. Contact a health care provider if: Your symptoms do not improve after 2 weeks. You have trouble coughing up the mucus. Your cough keeps you awake at night. You have a fever. Get help right away if you: Cough up blood. Feel pain in your chest. Have severe shortness of breath. Faint or keep feeling like you are going to faint. Have a severe headache. Have a fever or chills that get worse. These symptoms may represent a serious problem that is an emergency. Do not wait to see if the symptoms will go away. Get medical help right away. Call your local emergency services (911 in the [place] Do not drive yourself to the hospital. Summary Acute bronchitis is  inflammation of the main airways (bronchi) that come off the windpipe (trachea) in the lungs. The swelling causes the airways to get smaller and make more mucus than normal. Drinking more fluids can help thin your mucus so it is easier to cough up. Take over-the-counter and prescription medicines only as told by your health care provider. Do not use any products that contain nicotine or tobacco. These products include cigarettes, chewing tobacco, and vaping devices, such as e-cigarettes. If you need help quitting, ask your health care provider. Contact a health care provider if your symptoms do not improve after 2 weeks. This information is not intended to replace advice given to you by your health care provider. Make sure you discuss any questions you have with your health care provider. Document Revised: 08/04/2021 Document Reviewed: 08/25/2020 Elsevier Patient Education  2024 Elsevier Inc.   If you have been instructed to have an in-person evaluation today at a local Urgent Care facility, please use the link below. It will take you to a list of all of our available [facility], including address, phone number and hours of operation. Please do not delay care.  [facility]  If you or a family member do not have a primary care provider, use the link below to schedule a visit and establish care. When you choose a [facility] primary care physician or advanced practice provider, you gain a long-term partner in health. Find a Primary Care Provider  Learn more about [facility] in-office and virtual care options: [facility] Get Care Now "

## 2024-05-11 ENCOUNTER — Encounter: Payer: Self-pay | Admitting: Physician Assistant

## 2024-05-30 ENCOUNTER — Ambulatory Visit
Admission: EM | Admit: 2024-05-30 | Discharge: 2024-05-30 | Disposition: A | Discharge status: Home / Self Care | Attending: Family Medicine | Admitting: Family Medicine

## 2024-05-30 DIAGNOSIS — Z3201 Encounter for pregnancy test, result positive: Secondary | ICD-10-CM | POA: Diagnosis not present

## 2024-05-30 LAB — POCT URINE PREGNANCY: Preg Test, Ur: POSITIVE — AB

## 2024-05-30 NOTE — ED Provider Notes (Signed)
 " [facility]    CSN: [id number] Arrival date & time: 05/30/24  1900      History   Chief Complaint Chief Complaint  Patient presents with   Headache    [name] is a 29 y.o. female presents for possible pregnancy.  Patient reports she has had 2 weeks of intermittent headaches, nausea.  Denies any URI symptoms, fevers, vomiting.  She states her last period was December 5 and she is not on birth control and does endorse chance of pregnancy.  No dysuria.  She reports 2 home positive pregnancy test yesterday.  No other concerns.   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 Per consultation with Dr. Nicholaus, no further work-up necessary. Per MFM:  The patient was reassured that the anti-Lewis antibodies are usually of the IgM subtype and therefore we will not cross the placenta and cause fetal anemia.  The Lewis antibodies should therefore not affect the management of her current pregnancy.   PTSD (post-traumatic stress disorder)    Vaginal trichomoniasis 05/09/2019   04/30/2019 on Pap [] tx [] TOC- neg   Vitamin D  deficiency     Patient Active Problem List   Diagnosis Date Noted   Acanthosis nigricans 07/05/2022   Hyperhidrosis 07/04/2022   Moderate episode of recurrent major depressive disorder (HCC) 12/15/2021   PTSD (post-traumatic stress disorder) 12/15/2021   GAD (generalized anxiety disorder) 12/15/2021   Prediabetes 06/07/2021   Hospital discharge follow-up 02/04/2021   Choledocholithiasis     Cholelithiasis 01/25/2021   History of hydrocephalus 06/11/2019   History of blood clot in brain 05/28/2019   Alpha thalassemia silent carrier 05/12/2019   Morbid obesity (HCC) 04/30/2019    Past Surgical History:  Procedure Laterality Date   BRAIN SURGERY  03/01/1998   stint placed when born   CHOLECYSTECTOMY N/A 01/25/2021   Procedure: LAPAROSCOPIC CHOLECYSTECTOMY WITH ATTEMPTED CHOLANGIOGRAM;  Surgeon: Vernetta Berg, MD;  Location: [facility] OR;  Service: General;  Laterality: N/A;   ERCP N/A 01/29/2021   Procedure: ENDOSCOPIC RETROGRADE CHOLANGIOPANCREATOGRAPHY (ERCP);  Surgeon: Avram Lupita BRAVO, MD;  Location[facility] ENDOSCOPY;  Service: Endoscopy;  Laterality: N/A;   IR RADIOLOGIST EVAL & MGMT  03/23/2021   REMOVAL OF STONES  01/29/2021   Procedure: REMOVAL OF STONES;  Surgeon: Avram Lupita BRAVO, MD;  Location: [facility] ENDOSCOPY;  Service: Endoscopy;;   SPHINCTEROTOMY  01/29/2021   Procedure: ANNETT;  Surgeon: Avram Lupita BRAVO, MD;  Location: [facility] ENDOSCOPY;  Service: Endoscopy;;    OB History     Gravida  1   Para  1   Term  1   Preterm      AB  0   Living  1      SAB  0   IAB      Ectopic      Multiple  0   Live Births  1            Home Medications    Prior to Admission medications  Medication  Sig Start Date End Date Taking? Authorizing Provider  albuterol  (PROVENTIL ) (2.5 MG/3ML) 0.083% nebulizer solution Take 3 mLs (2.5 mg total) by nebulization every 6 (six) hours as needed for wheezing or shortness of breath. Patient not taking: Reported on 04/29/2024 06/05/23   Hope Almarie ORN, NP  albuterol  (VENTOLIN  HFA) 108 [phone] Base) MCG/ACT inhaler Inhale 2 puffs into the lungs every 6 (six) hours as needed for wheezing or shortness of breath. 12/13/22   Jude Harden GAILS, MD  Albuterol -Budesonide  (AIRSUPRA ) 90-80 MCG/ACT AERO Inhale 1-2 puffs into the lungs every 4 (four) hours as needed. 03/21/24   Hattar, Zola SAILOR, MD  budesonide -formoterol  (SYMBICORT ) 160-4.5 MCG/ACT  inhaler Inhale 2 puffs into the lungs 2 (two) times daily. Also use 1-2 Puffs every 6 hours as needed for shortness of breath or wheezing. Don't exceed 8 puffs per day. 04/02/24   Hattar, Zola SAILOR, MD  formoterol  (PERFOROMIST ) 20 MCG/2ML nebulizer solution USE 1 VIAL VIA NEBULIZER TWICE DAILY 03/31/24   Iva Marty Saltness, MD  levocetirizine (XYZAL ) 5 MG tablet Take 1 tablet (5 mg total) by mouth every evening. 01/23/24   Hope Almarie ORN, NP  montelukast  (SINGULAIR ) 10 MG tablet Take 1 tablet (10 mg total) by mouth at bedtime. 01/23/24   Hope Almarie ORN, NP  predniSONE  (DELTASONE ) 20 MG tablet Take 2 tablets (40 mg total) by mouth daily with breakfast. 05/09/24   Vivienne Delon HERO, PA-C  promethazine -dextromethorphan (PROMETHAZINE -DM) 6.25-15 MG/5ML syrup Take 5 mLs by mouth 4 (four) times daily as needed. 05/09/24   Vivienne Delon HERO, PA-C  Spacer/Aero-Holding Chambers (AEROCHAMBER MV) inhaler Use as instructed 04/12/22   McQuaid, Douglas B, MD  diphenhydrAMINE  (BENADRYL ) 25 MG tablet Take 12.5 mg by mouth every 6 (six) hours as needed for allergies. Patient not taking: Reported on 12/30/2019  06/21/20  [provider]    Family History Family History  Problem Relation Age of Onset   Asthma Mother    Healthy Father    Miscarriages / Stillbirths Sister    Diabetes Maternal Grandmother     Social History Social History[1]   Allergies   Metoclopramide  and Ibuprofen -diphenhydramine  cit   Review of Systems Review of Systems  Constitutional:  Positive for fatigue.  Gastrointestinal:  Positive for nausea.  Genitourinary:  Positive for menstrual problem.  Neurological:  Positive for headaches.     Physical Exam Triage Vital Signs ED Triage Vitals  Encounter Vitals Group     BP 05/30/24 1918 130/72     Girls Systolic BP Percentile --      Girls Diastolic BP Percentile --      Boys Systolic BP Percentile --      Boys Diastolic BP Percentile --      Pulse Rate  05/30/24 1918 78     Resp 05/30/24 1918 16     Temp 05/30/24 1918 97.9 F (36.6 C)     Temp Source 05/30/24 1918 Oral     SpO2 05/30/24 1918 100 %     Weight --      Height --      Head Circumference --      Peak Flow --      Pain Score 05/30/24 1917 9     Pain Loc --      Pain Education --      Exclude from Growth Chart --    No data found.  Updated Vital Signs BP 130/72 (BP Location: Right Arm)   Pulse 78   Temp 97.9 F (  36.6 C) (Oral)   Resp 16   LMP 04/11/2024   SpO2 100%   Visual Acuity Right Eye Distance:   Left Eye Distance:   Bilateral Distance:    Right Eye Near:   Left Eye Near:    Bilateral Near:     Physical Exam Vitals and nursing note reviewed.  Constitutional:      Appearance: Normal appearance.  HENT:     Head: Normocephalic and atraumatic.  Eyes:     Pupils: Pupils are equal, round, and reactive to light.  Cardiovascular:     Rate and Rhythm: Normal rate.  Pulmonary:     Effort: Pulmonary effort is normal.  Skin:    General: Skin is warm and dry.  Neurological:     General: No focal deficit present.     Mental Status: She is alert and oriented to person, place, and time.  Psychiatric:        Mood and Affect: Mood normal.        Behavior: Behavior normal.      UC Treatments / Results  Labs (all labs ordered are listed, but only abnormal results are displayed) Labs Reviewed  POCT URINE PREGNANCY - Abnormal; Notable for the following components:      Result Value   Preg Test, Ur Positive (*)    All other components within normal limits    EKG   Radiology No results found.  Procedures Procedures (including critical care time)  Medications Ordered in UC Medications - No data to display  Initial Impression / Assessment and Plan / UC Course  I have reviewed the triage vital signs and the nursing notes.  Pertinent labs & imaging results that were available during my care of the patient were reviewed by me and considered in  my medical decision making (see chart for details).     Reviewed exam and symptoms with patient.  Positive urine hCG.  Advised patient to establish with OB for prenatal care.  ER precautions reviewed Final Clinical Impressions(s) / UC Diagnoses   Final diagnoses:  Positive urine pregnancy test     Discharge Instructions      Your pregnancy test was positive.  Please establish with an OB for your prenatal care.  Please go to emergency room if you develop any vaginal bleeding or abdominal pain congratulations!    ED Prescriptions   None    PDMP not reviewed this encounter.    [1]  Social History Tobacco Use   Smoking status: Never    Passive exposure: Never   Smokeless tobacco: Never  Vaping Use   Vaping status: Never Used  Substance Use Topics   Alcohol use: Never   Drug use: Never     Loreda Myla SAUNDERS, NP 05/30/24 1949  "

## 2024-05-30 NOTE — Discharge Instructions (Addendum)
 Your pregnancy test was positive.  Please establish with an OB for your prenatal care.  Please go to emergency room if you develop any vaginal bleeding or abdominal pain congratulations!

## 2024-05-30 NOTE — ED Triage Notes (Signed)
 Pt states headache,nausea and fatigue for the past 2 weeks.

## 2024-06-10 ENCOUNTER — Inpatient Hospital Stay (HOSPITAL_COMMUNITY)
Admission: AD | Admit: 2024-06-10 | Discharge: 2024-06-10 | Discharge status: Against Medical Advice | Attending: Obstetrics & Gynecology | Admitting: Obstetrics & Gynecology

## 2024-06-10 ENCOUNTER — Encounter (HOSPITAL_COMMUNITY): Payer: Self-pay | Admitting: *Deleted

## 2024-06-10 ENCOUNTER — Inpatient Hospital Stay (HOSPITAL_COMMUNITY)
Admission: AD | Admit: 2024-06-10 | Discharge: 2024-06-10 | Disposition: A | Discharge status: Home / Self Care | Attending: Obstetrics & Gynecology | Admitting: Obstetrics & Gynecology

## 2024-06-10 DIAGNOSIS — R519 Headache, unspecified: Secondary | ICD-10-CM | POA: Insufficient documentation

## 2024-06-10 DIAGNOSIS — O219 Vomiting of pregnancy, unspecified: Secondary | ICD-10-CM | POA: Insufficient documentation

## 2024-06-10 DIAGNOSIS — O26891 Other specified pregnancy related conditions, first trimester: Secondary | ICD-10-CM | POA: Insufficient documentation

## 2024-06-10 DIAGNOSIS — Z5329 Procedure and treatment not carried out because of patient's decision for other reasons: Secondary | ICD-10-CM | POA: Insufficient documentation

## 2024-06-10 DIAGNOSIS — Z3A08 8 weeks gestation of pregnancy: Secondary | ICD-10-CM | POA: Insufficient documentation

## 2024-06-10 DIAGNOSIS — Z888 Allergy status to other drugs, medicaments and biological substances status: Secondary | ICD-10-CM | POA: Insufficient documentation

## 2024-06-10 DIAGNOSIS — Z7951 Long term (current) use of inhaled steroids: Secondary | ICD-10-CM | POA: Insufficient documentation

## 2024-06-10 DIAGNOSIS — Z712 Person consulting for explanation of examination or test findings: Secondary | ICD-10-CM

## 2024-06-10 LAB — COMPREHENSIVE METABOLIC PANEL WITH GFR
ALT: 10 U/L (ref 0–44)
AST: 20 U/L (ref 15–41)
Albumin: 3.7 g/dL (ref 3.5–5.0)
Alkaline Phosphatase: 57 U/L (ref 38–126)
Anion gap: 10 (ref 5–15)
BUN: 10 mg/dL (ref 6–20)
CO2: 22 mmol/L (ref 22–32)
Calcium: 9.2 mg/dL (ref 8.9–10.3)
Chloride: 101 mmol/L (ref 98–111)
Creatinine, Ser: 0.65 mg/dL (ref 0.44–1.00)
GFR, Estimated: >60 mL/min
Glucose, Bld: 105 mg/dL — ABNORMAL HIGH (ref 70–99)
Potassium: 4.4 mmol/L (ref 3.5–5.1)
Sodium: 133 mmol/L — ABNORMAL LOW (ref 135–145)
Total Bilirubin: <0.2 mg/dL (ref 0.0–1.2)
Total Protein: 7 g/dL (ref 6.5–8.1)

## 2024-06-10 LAB — URINALYSIS, ROUTINE W REFLEX MICROSCOPIC
Bilirubin Urine: NEGATIVE
Glucose, UA: NEGATIVE mg/dL
Hgb urine dipstick: NEGATIVE
Ketones, ur: NEGATIVE mg/dL
Nitrite: NEGATIVE
Protein, ur: NEGATIVE mg/dL
Specific Gravity, Urine: 1.027 (ref 1.005–1.030)
pH: 6 (ref 5.0–8.0)

## 2024-06-10 LAB — CBC
HCT: 39.1 % (ref 36.0–46.0)
Hemoglobin: 12.7 g/dL (ref 12.0–15.0)
MCH: 28 pg (ref 26.0–34.0)
MCHC: 32.5 g/dL (ref 30.0–36.0)
MCV: 86.3 fL (ref 80.0–100.0)
Platelets: 290 10*3/uL (ref 150–400)
RBC: 4.53 MIL/uL (ref 3.87–5.11)
RDW: 13.3 % (ref 11.5–15.5)
WBC: 9 10*3/uL (ref 4.0–10.5)
nRBC: 0 % (ref 0.0–0.2)

## 2024-06-10 LAB — HCG, QUANTITATIVE, PREGNANCY: hCG, Beta Chain, Quant, S: 94490 m[IU]/mL — ABNORMAL HIGH

## 2024-06-10 LAB — MAGNESIUM: Magnesium: 1.7 mg/dL (ref 1.7–2.4)

## 2024-06-10 MED ORDER — PROMETHAZINE HCL 25 MG PO TABS: 25.0000 mg | ORAL_TABLET | Freq: Four times a day (QID) | ORAL | 2 refills | Status: AC | PRN

## 2024-06-10 MED ORDER — SCOPOLAMINE 1 MG/3DAYS TD PT72
1.0000 | MEDICATED_PATCH | Freq: Once | TRANSDERMAL | Status: DC
  Administered 2024-06-10: 1 mg via TRANSDERMAL
  Filled 2024-06-10: qty 1

## 2024-06-10 MED ORDER — ONDANSETRON HCL 4 MG/2ML IJ SOLN: 4.0000 mg | Freq: Once | INTRAMUSCULAR | Status: DC

## 2024-06-10 MED ORDER — LACTATED RINGERS IV BOLUS: 1000.0000 mL | Freq: Once | INTRAVENOUS | Status: DC

## 2024-06-10 MED ORDER — SCOPOLAMINE 1 MG/3DAYS TD PT72: 1.0000 | MEDICATED_PATCH | TRANSDERMAL | 0 refills | Status: AC

## 2024-06-10 MED ORDER — ACETAMINOPHEN-CAFFEINE 500-65 MG PO TABS
2.0000 | ORAL_TABLET | Freq: Once | ORAL | Status: AC
  Administered 2024-06-10: 2 via ORAL
  Filled 2024-06-10: qty 2

## 2024-06-10 NOTE — MAU Provider Note (Signed)
 CC/ Returns to discuss results and requested prescriptions for antiemetics    Subjective/HPI [name] is a 29 y.o. G2P1001 patient who presents to MAU today with complaint of ***.     Objective BP 118/68   Pulse 79   Temp 98 F (36.7 C)   Resp 18   LMP 04/11/2024      MDM  LOW  No orders of the defined types were placed in this encounter.     Results for orders placed or performed during the hospital encounter of 06/10/24 (from the past 24 hours)  Urinalysis, Routine w reflex microscopic -Urine, Clean Catch     Status: Abnormal   Collection Time: 06/10/24  9:51 AM  Result Value Ref Range   Color, Urine YELLOW YELLOW   APPearance CLOUDY (A) CLEAR   Specific Gravity, Urine 1.027 1.005 - 1.030   pH 6.0 5.0 - 8.0   Glucose, UA NEGATIVE NEGATIVE mg/dL   Hgb urine dipstick NEGATIVE NEGATIVE   Bilirubin Urine NEGATIVE NEGATIVE   Ketones, ur NEGATIVE NEGATIVE mg/dL   Protein, ur NEGATIVE NEGATIVE mg/dL   Nitrite NEGATIVE NEGATIVE   Leukocytes,Ua SMALL (A) NEGATIVE   RBC / HPF 0-5 0 - 5 RBC/hpf   WBC, UA 11-20 0 - 5 WBC/hpf   Bacteria, UA MANY (A) NONE SEEN   Squamous Epithelial / HPF 11-20 0 - 5 /HPF   Mucus PRESENT   CBC     Status: None   Collection Time: 06/10/24 10:25 AM  Result Value Ref Range   WBC 9.0 4.0 - 10.5 K/uL   RBC 4.53 3.87 - 5.11 MIL/uL   Hemoglobin 12.7 12.0 - 15.0 g/dL   HCT 60.8 63.9 - 53.9 %   MCV 86.3 80.0 - 100.0 fL   MCH 28.0 26.0 - 34.0 pg   MCHC 32.5 30.0 - 36.0 g/dL   RDW 86.6 88.4 - 84.4 %   Platelets 290 150 - 400 K/uL   nRBC 0.0 0.0 - 0.2 %  Magnesium      Status: None   Collection Time: 06/10/24 10:25 AM  Result Value Ref Range   Magnesium  1.7 1.7 - 2.4 mg/dL  Comprehensive metabolic panel with GFR     Status: Abnormal   Collection Time: 06/10/24 10:25 AM  Result Value Ref Range   Sodium 133 (L) 135 - 145 mmol/L   Potassium 4.4 3.5 - 5.1 mmol/L   Chloride 101 98 - 111 mmol/L   CO2 22 22 - 32 mmol/L   Glucose, Bld  105 (H) 70 - 99 mg/dL   BUN 10 6 - 20 mg/dL   Creatinine, Ser 9.34 0.44 - 1.00 mg/dL   Calcium  9.2 8.9 - 10.3 mg/dL   Total Protein 7.0 6.5 - 8.1 g/dL   Albumin 3.7 3.5 - 5.0 g/dL   AST 20 15 - 41 U/L   ALT 10 0 - 44 U/L   Alkaline Phosphatase 57 38 - 126 U/L   Total Bilirubin <0.2 0.0 - 1.2 mg/dL   GFR, Estimated >39 >39 mL/min   Anion gap 10 5 - 15  hCG, quantitative, pregnancy     Status: Abnormal   Collection Time: 06/10/24 10:25 AM  Result Value Ref Range   hCG, Beta Chain, Quant, S 94,490 (H) <5 mIU/mL       ASSESSMENT Medical screening exam complete     PLAN Future Appointments  Date Time Provider Department Center  07/28/2024 11:30 AM Hattar, Zola SAILOR, MD [facility] [phone] [facility]  Discharge from MAU in stable condition See AVS for full description of educational information and instructions provided to the patient at time of discharge List of options for follow-up given *** Warning signs for worsening condition that would warrant emergency follow-up discussed Patient may return to MAU as needed   Littie Olam LABOR, NP 06/10/2024 1:26 PM

## 2024-06-10 NOTE — MAU Note (Signed)
 Martha Hall is a 29 y.o. at [redacted]w[redacted]d here in MAU reporting: having N/V for the past 2 days. Had some zofran  and tried did not help much. Also c/o headache  LMP: 04/11/2024 Onset of complaint: 2 days  Pain score: 8 Vitals:   06/10/24 0942  BP: 125/62  Pulse: 87  Resp: 18  Temp: 98.6 F (37 C)     FHT: n/a   Lab orders placed from triage: u/a

## 2024-06-11 LAB — GC/CHLAMYDIA PROBE AMP (~~LOC~~) NOT AT ARMC
Chlamydia: NEGATIVE
Comment: NEGATIVE
Comment: NORMAL
Neisseria Gonorrhea: NEGATIVE

## 2024-07-28 ENCOUNTER — Ambulatory Visit

## 2024-08-22 ENCOUNTER — Encounter: Admitting: Family

## 2024-09-30 ENCOUNTER — Encounter: Admitting: Family
